# Patient Record
Sex: Female | Born: 1965 | Race: White | Hispanic: No | Marital: Married | Smoking: Former smoker
Health system: Southern US, Community
[De-identification: ages and names within clinical notes are randomized; demographics above are authoritative.]

## PROBLEM LIST (undated history)

## (undated) DIAGNOSIS — F419 Anxiety disorder, unspecified: Secondary | ICD-10-CM

## (undated) DIAGNOSIS — K449 Diaphragmatic hernia without obstruction or gangrene: Secondary | ICD-10-CM

## (undated) DIAGNOSIS — F32A Depression, unspecified: Secondary | ICD-10-CM

## (undated) DIAGNOSIS — K219 Gastro-esophageal reflux disease without esophagitis: Secondary | ICD-10-CM

## (undated) DIAGNOSIS — T8859XA Other complications of anesthesia, initial encounter: Secondary | ICD-10-CM

## (undated) DIAGNOSIS — C801 Malignant (primary) neoplasm, unspecified: Secondary | ICD-10-CM

## (undated) DIAGNOSIS — R6884 Jaw pain: Secondary | ICD-10-CM

## (undated) DIAGNOSIS — E7800 Pure hypercholesterolemia, unspecified: Secondary | ICD-10-CM

## (undated) DIAGNOSIS — L409 Psoriasis, unspecified: Secondary | ICD-10-CM

## (undated) DIAGNOSIS — C8590 Non-Hodgkin lymphoma, unspecified, unspecified site: Secondary | ICD-10-CM

## (undated) DIAGNOSIS — R0600 Dyspnea, unspecified: Secondary | ICD-10-CM

## (undated) DIAGNOSIS — R079 Chest pain, unspecified: Secondary | ICD-10-CM

## (undated) DIAGNOSIS — D649 Anemia, unspecified: Secondary | ICD-10-CM

## (undated) HISTORY — DX: Psoriasis, unspecified: L40.9

## (undated) HISTORY — DX: Malignant (primary) neoplasm, unspecified: C80.1

## (undated) HISTORY — DX: Pure hypercholesterolemia, unspecified: E78.00

## (undated) HISTORY — DX: Diaphragmatic hernia without obstruction or gangrene: K44.9

## (undated) HISTORY — DX: Non-Hodgkin lymphoma, unspecified, unspecified site: C85.90

## (undated) HISTORY — DX: Chest pain, unspecified: R07.9

## (undated) HISTORY — DX: Depression, unspecified: F32.A

## (undated) HISTORY — DX: Jaw pain: R68.84

## (undated) HISTORY — DX: Anemia, unspecified: D64.9

## (undated) HISTORY — PX: TUBAL LIGATION: SHX77

---

## 2015-10-15 ENCOUNTER — Other Ambulatory Visit: Payer: Self-pay | Admitting: Obstetrics and Gynecology

## 2015-10-15 DIAGNOSIS — E041 Nontoxic single thyroid nodule: Secondary | ICD-10-CM

## 2015-11-15 ENCOUNTER — Other Ambulatory Visit: Payer: Self-pay

## 2015-11-19 HISTORY — PX: ESOPHAGOGASTRODUODENOSCOPY: SHX1529

## 2017-06-02 ENCOUNTER — Other Ambulatory Visit: Payer: Self-pay | Admitting: Obstetrics and Gynecology

## 2017-06-02 DIAGNOSIS — E041 Nontoxic single thyroid nodule: Secondary | ICD-10-CM

## 2018-04-07 HISTORY — PX: COLONOSCOPY: SHX174

## 2021-09-25 ENCOUNTER — Other Ambulatory Visit: Payer: Self-pay | Admitting: Obstetrics and Gynecology

## 2021-09-25 DIAGNOSIS — E041 Nontoxic single thyroid nodule: Secondary | ICD-10-CM

## 2021-09-27 ENCOUNTER — Other Ambulatory Visit: Payer: Self-pay | Admitting: Obstetrics and Gynecology

## 2021-09-27 DIAGNOSIS — R928 Other abnormal and inconclusive findings on diagnostic imaging of breast: Secondary | ICD-10-CM

## 2021-10-08 ENCOUNTER — Other Ambulatory Visit: Payer: Self-pay

## 2021-10-09 ENCOUNTER — Ambulatory Visit
Admission: RE | Admit: 2021-10-09 | Discharge: 2021-10-09 | Disposition: A | Discharge status: Home / Self Care | Payer: BC Managed Care – PPO | Source: Ambulatory Visit | Attending: Obstetrics and Gynecology | Admitting: Obstetrics and Gynecology

## 2021-10-09 DIAGNOSIS — E041 Nontoxic single thyroid nodule: Secondary | ICD-10-CM

## 2021-11-05 ENCOUNTER — Other Ambulatory Visit: Payer: Self-pay | Admitting: Obstetrics and Gynecology

## 2021-11-05 ENCOUNTER — Ambulatory Visit
Admission: RE | Admit: 2021-11-05 | Discharge: 2021-11-05 | Disposition: A | Discharge status: Home / Self Care | Payer: BC Managed Care – PPO | Source: Ambulatory Visit | Attending: Obstetrics and Gynecology | Admitting: Obstetrics and Gynecology

## 2021-11-05 ENCOUNTER — Ambulatory Visit
Admission: RE | Admit: 2021-11-05 | Discharge: 2021-11-05 | Disposition: A | Discharge status: Home / Self Care | Payer: Self-pay | Source: Ambulatory Visit | Attending: Obstetrics and Gynecology | Admitting: Obstetrics and Gynecology

## 2021-11-05 DIAGNOSIS — R928 Other abnormal and inconclusive findings on diagnostic imaging of breast: Secondary | ICD-10-CM

## 2021-11-20 ENCOUNTER — Ambulatory Visit
Admission: RE | Admit: 2021-11-20 | Discharge: 2021-11-20 | Disposition: A | Discharge status: Home / Self Care | Payer: BC Managed Care – PPO | Source: Ambulatory Visit | Attending: Obstetrics and Gynecology | Admitting: Obstetrics and Gynecology

## 2021-11-20 DIAGNOSIS — R928 Other abnormal and inconclusive findings on diagnostic imaging of breast: Secondary | ICD-10-CM

## 2021-11-20 HISTORY — PX: BREAST BIOPSY: SHX20

## 2021-11-22 ENCOUNTER — Telehealth: Payer: Self-pay | Admitting: Hematology

## 2021-11-22 ENCOUNTER — Encounter: Payer: Self-pay | Admitting: Radiation Oncology

## 2021-11-22 NOTE — Telephone Encounter (Signed)
Spoke to patient to confirm morning clinic appointment for 1/18, packet will be sent via mail

## 2021-11-25 ENCOUNTER — Encounter: Payer: Self-pay | Admitting: *Deleted

## 2021-11-26 ENCOUNTER — Other Ambulatory Visit: Payer: Self-pay

## 2021-11-26 ENCOUNTER — Other Ambulatory Visit: Payer: Self-pay | Admitting: *Deleted

## 2021-11-26 DIAGNOSIS — Z170 Estrogen receptor positive status [ER+]: Secondary | ICD-10-CM

## 2021-11-26 DIAGNOSIS — C50919 Malignant neoplasm of unspecified site of unspecified female breast: Secondary | ICD-10-CM

## 2021-11-26 DIAGNOSIS — C50212 Malignant neoplasm of upper-inner quadrant of left female breast: Secondary | ICD-10-CM

## 2021-11-26 HISTORY — DX: Estrogen receptor positive status (ER+): C50.212

## 2021-11-26 NOTE — Progress Notes (Signed)
Radiation Oncology         [phone] ________________________________  Multidisciplinary Breast Oncology Clinic [facility]) Initial Outpatient Consultation  Name: Erika Hodge MRN: [id number]  Date: 11/27/2021  DOB: 1966/03/16  GB:EEFEO, Erika Hodge  Erroll Luna, Hodge   REFERRING PHYSICIAN: Erroll Luna, Hodge  DIAGNOSIS: The encounter diagnosis was Malignant neoplasm of upper-inner quadrant of left breast in female, estrogen receptor positive ([place]).  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 Malignant neoplasm of upper-inner quadrant of left breast in female, estrogen receptor positive ([place])  C50.212    Z17.0       HISTORY OF PRESENT ILLNESS::Erika Hodge is a 56 y.o. female who is presenting to the office today for evaluation of her newly diagnosed breast cancer. She is accompanied by her husband. She is doing well overall.   She had routine screening mammography on an unknown showing a possible abnormality in the left breast. She underwent unilateral left diagnostic mammography with tomography and left breast ultrasonography at [facility] on 11/05/21 showing: an irregular 1.2 cm mass in the left breast, 11 o'clock position, 2 cm the nipple, as suspicious for malignancy.  Left breast 11 o'clock biopsy on 11/20/21 showed: grade 2 invasive ductal carcinoma measuring 0.6 cm in the greatest linear extent, and ductal carcinoma in-situ. Prognostic indicators significant for: estrogen receptor, 10% positive with weak staining intensity, and progesterone receptor, 0% negative, both with. Proliferation marker Ki67 at 20%. HER2 negative.  Menarche: 56 years old Age at first live birth: 56 years old GP: 3 LMP: no longer having periods; s/p tubal ligation, did not indicate date of last menses on the provided form Contraceptive: used for "maybe 3 years"  HRT: yes, she  stopped this last year    The patient was referred today for presentation in the multidisciplinary conference.  Radiology studies and pathology slides were presented there for review and discussion of treatment options.  A consensus was discussed regarding potential next steps.  PREVIOUS RADIATION THERAPY: Yes, for Non Hodgkin's Lymphoma, details are pending at this time, but she reports that she had treatment in the lower neck and upper chest region, for about 4 weeks (in the mid 80's).  Reports receiving her radiation therapy at [facility].  She reports her medical oncologist was Hansel Feinstein.  PAST MEDICAL HISTORY:  Past Medical History:  Diagnosis Date   Non Hodgkin's lymphoma ([place])    Psoriasis     PAST SURGICAL HISTORY: Past Surgical History:  Procedure Laterality Date   BREAST BIOPSY Left 11/20/2021   TUBAL LIGATION      FAMILY HISTORY:  Family History  Problem Relation Age of Onset   Breast cancer Mother 62   Throat cancer Father     SOCIAL HISTORY:  Social History   Socioeconomic History   Marital status: Married    Spouse name: Not on file   Number of children: 3   Years of education: Not on file   Highest education level: Not on file  Occupational History   Not on file  Tobacco Use   Smoking status: Former    Packs/day: 0.50    Years: 15.00    Pack years: 7.50    Types: Cigarettes    Quit date: 2016    Years since quitting: 7.0   Smokeless tobacco: Not on file  Substance and Sexual Activity  Alcohol use: Yes    Comment: occas   Drug use: Not on file   Sexual activity: Not on file  Other Topics Concern   Not on file  Social History Narrative   Not on file   Social Determinants of Health   Financial Resource Strain: Not on file  Food Insecurity: Not on file  Transportation Needs: Not on file  Physical Activity: Not on file  Stress: Not on file  Social Connections: Not on file    ALLERGIES:  Allergies  Allergen Reactions   Amoxicillin  Rash and Other (See Comments)    Throat closing   Codeine Nausea Only    MEDICATIONS:  Current Outpatient Medications  Medication Sig Dispense Refill   Apple Cider Vinegar 300 MG TABS Take 1 tablet by mouth daily.     ascorbic acid (VITAMIN C) 250 MG CHEW Chew 250 mg by mouth daily.     ASHWAGANDHA PO Take 1 tablet by mouth daily.     Calcipotriene-Betameth Diprop (ENSTILAR) 0.005-0.064 % FOAM Apply 1 application topically as needed (apply to psoriasis area as needed).     FLUoxetine (PROZAC) 20 MG capsule Take 20 mg by mouth as needed.     lisdexamfetamine (VYVANSE) 50 MG capsule Take 50 mg by mouth as needed.     loratadine (CLARITIN) 10 MG tablet Take 10 mg by mouth daily.     Multiple Vitamins-Minerals (EMERGEN-C SUPER FRUIT PO) Take 1 tablet by mouth daily.     omeprazole (PRILOSEC) 40 MG capsule Take 40 mg by mouth daily.     Probiotic Product (DIGESTIVE ADV MULTI-STRAIN PO) Take 1 tablet by mouth daily.     zolpidem (AMBIEN) 5 MG tablet Take 5 mg by mouth at bedtime as needed for sleep.     No current facility-administered medications for this encounter.    REVIEW OF SYSTEMS: A 10+ POINT REVIEW OF SYSTEMS WAS OBTAINED including neurology, dermatology, psychiatry, cardiac, respiratory, lymph, extremities, GI, GU, musculoskeletal, constitutional, reproductive, HEENT. On the provided form, she reports loss of sleep, sinus problems, heartburn, psoriasis, forgetfulness, and anxiety . She denies any other symptoms.    PHYSICAL EXAM:   Vitals with BMI 11/27/2021  Height 5' 4"   Weight 174 lbs 3 oz  BMI 16.10  Systolic 960  Diastolic 85  Pulse 86    Lungs are clear to auscultation bilaterally. Heart has regular rate and rhythm. No palpable cervical, supraclavicular, or axillary adenopathy. Abdomen soft, non-tender, normal bowel sounds. Breast: Right breast with no palpable mass, nipple discharge, or bleeding. Left breast with bruising in the UOQ, and palpable induration in the 11  o'clock position, estimated to be about 1.5 cm in size. 3 prominent radiation tattoos were also appreciated in the upper chest region.   KPS = 90  100 - Normal; no complaints; no evidence of disease. 90   - Able to carry on normal activity; minor signs or symptoms of disease. 80   - Normal activity with effort; some signs or symptoms of disease. 77   - Cares for self; unable to carry on normal activity or to do active work. 60   - Requires occasional assistance, but is able to care for most of his personal needs. 50   - Requires considerable assistance and frequent medical care. 27   - Disabled; requires special care and assistance. 82   - Severely disabled; hospital admission is indicated although death not imminent. 11   - Very sick; hospital admission necessary; active supportive treatment  necessary. 10   - Moribund; fatal processes progressing rapidly. 0     - Dead  Karnofsky DA, Abelmann [place], Craver LS and Burchenal [facility] [phone]) The use of the nitrogen mustards in the palliative treatment of carcinoma: with particular reference to bronchogenic carcinoma Cancer 1 634-56  LABORATORY DATA:  Lab Results  Component Value Date   WBC 7.1 11/27/2021   HGB 13.4 11/27/2021   HCT 42.0 11/27/2021   MCV 96.3 11/27/2021   PLT 360 11/27/2021   Lab Results  Component Value Date   NA 137 11/27/2021   K 4.2 11/27/2021   CL 102 11/27/2021   CO2 28 11/27/2021   Lab Results  Component Value Date   ALT 15 11/27/2021   AST 17 11/27/2021   ALKPHOS 62 11/27/2021   BILITOT 0.3 11/27/2021    PULMONARY FUNCTION TEST:   Recent Review Flowsheet Data   There is no flowsheet data to display.     RADIOGRAPHY: US BREAST LTD UNI LEFT INC AXILLA  Result Date: 11/05/2021 CLINICAL DATA:  Screening recall for a possible left breast asymmetry. EXAM: DIGITAL DIAGNOSTIC UNILATERAL LEFT MAMMOGRAM WITH TOMOSYNTHESIS AND CAD; ULTRASOUND LEFT BREAST LIMITED TECHNIQUE: Left digital diagnostic mammography and  breast tomosynthesis was performed. The images were evaluated with computer-aided detection.; Targeted ultrasound examination of the left breast was performed. COMPARISON:  Previous exam(s). ACR Breast Density Category c: The breast tissue is heterogeneously dense, which may obscure small masses. FINDINGS: On the diagnostic images, the possible asymmetry noted in the posterior, slightly medial aspect of the left breast on the current screening cc view, persists as a possible irregular mass. An irregular mass is also suggested on the diagnostic mL images in the upper slightly medial left breast near 11 o'clock, around 1 cm in greatest dimension. There is subtle associated architectural distortion suggested on the mL images. On physical exam, no discrete mass is palpated in the upper inner left breast. Targeted ultrasound is performed, showing an irregular hypoechoic mass with associated architectural distortion in the left breast at 11 o'clock, 2 cm the nipple, posterior depth, measuring 1.2 x 0.9 x 1.1 cm, consistent in size, shape and location to the mammographic abnormality. Sonographic imaging of the left axilla demonstrates normal lymph nodes. There are no enlarged or abnormal appearing lymph nodes. IMPRESSION: 1. Irregular 1.2 cm mass in the left breast at 11 o'clock, 2 cm the nipple, suspicious for malignancy. RECOMMENDATION: 1. Ultrasound-guided core needle biopsy of the 11 o'clock position, 1.2 cm left breast mass. This procedure was scheduled prior to the patient being discharged from the [facility]. I have discussed the findings and recommendations with the patient. If applicable, a reminder letter will be sent to the patient regarding the next appointment. BI-RADS CATEGORY  4: Suspicious. Electronically Signed   By: Lajean Manes M.D.   On: 11/05/2021 11:51  MM DIAG BREAST TOMO UNI LEFT  Result Date: 11/05/2021 CLINICAL DATA:  Screening recall for a possible left breast asymmetry. EXAM: DIGITAL  DIAGNOSTIC UNILATERAL LEFT MAMMOGRAM WITH TOMOSYNTHESIS AND CAD; ULTRASOUND LEFT BREAST LIMITED TECHNIQUE: Left digital diagnostic mammography and breast tomosynthesis was performed. The images were evaluated with computer-aided detection.; Targeted ultrasound examination of the left breast was performed. COMPARISON:  Previous exam(s). ACR Breast Density Category c: The breast tissue is heterogeneously dense, which may obscure small masses. FINDINGS: On the diagnostic images, the possible asymmetry noted in the posterior, slightly medial aspect of the left breast on the current screening cc view, persists as a possible  irregular mass. An irregular mass is also suggested on the diagnostic mL images in the upper slightly medial left breast near 11 o'clock, around 1 cm in greatest dimension. There is subtle associated architectural distortion suggested on the mL images. On physical exam, no discrete mass is palpated in the upper inner left breast. Targeted ultrasound is performed, showing an irregular hypoechoic mass with associated architectural distortion in the left breast at 11 o'clock, 2 cm the nipple, posterior depth, measuring 1.2 x 0.9 x 1.1 cm, consistent in size, shape and location to the mammographic abnormality. Sonographic imaging of the left axilla demonstrates normal lymph nodes. There are no enlarged or abnormal appearing lymph nodes. IMPRESSION: 1. Irregular 1.2 cm mass in the left breast at 11 o'clock, 2 cm the nipple, suspicious for malignancy. RECOMMENDATION: 1. Ultrasound-guided core needle biopsy of the 11 o'clock position, 1.2 cm left breast mass. This procedure was scheduled prior to the patient being discharged from the [facility]. I have discussed the findings and recommendations with the patient. If applicable, a reminder letter will be sent to the patient regarding the next appointment. BI-RADS CATEGORY  4: Suspicious. Electronically Signed   By: Lajean Manes M.D.   On: 11/05/2021  11:51  MM CLIP PLACEMENT LEFT  Result Date: 11/20/2021 CLINICAL DATA:  Post procedure mammogram for clip placement. EXAM: 3D DIAGNOSTIC LEFT MAMMOGRAM POST ULTRASOUND BIOPSY COMPARISON:  Previous exam(s). FINDINGS: 3D Mammographic images were obtained following ultrasound guided biopsy of a mass in the left breast at 11 o'clock. The biopsy marking clip is in expected position at the site of biopsy. IMPRESSION: Appropriate positioning of the ribbon shaped biopsy marking clip at the site of biopsy in the left breast at 11 o'clock. Final Assessment: Post Procedure Mammograms for Marker Placement Electronically Signed   By: Audie Pinto M.D.   On: 11/20/2021 08:32  [place] LT BREAST BX W LOC DEV 1ST LESION IMG BX SPEC US GUIDE  Addendum Date: 11/22/2021   ADDENDUM REPORT: 11/22/2021 08:21 ADDENDUM: Pathology revealed GRADE II INVASIVE DUCTAL CARCINOMA, DUCTAL CARCINOMA IN SITU of the LEFT breast, 11 o'clock, (ribbon clip). This was found to be concordant by Dr. Audie Pinto. Pathology results were discussed with the patient by telephone. The patient reported doing well after the biopsy with tenderness and bruising at the site. Post biopsy instructions and care were reviewed and questions were answered. The patient was encouraged to call [facility] for any additional concerns. My direct phone number was provided. The patient was referred to [facility] on November 27, 2021. Pathology results reported by Terie Purser, RN on 11/22/2021. Electronically Signed   By: Audie Pinto M.D.   On: 11/22/2021 08:21   Result Date: 11/22/2021 CLINICAL DATA:  56 year old female presenting for biopsy of a left breast mass. EXAM: ULTRASOUND GUIDED LEFT BREAST CORE NEEDLE BIOPSY COMPARISON:  Previous exam(s). PROCEDURE: I met with the patient and we discussed the procedure of ultrasound-guided biopsy, including benefits  and alternatives. We discussed the high likelihood of a successful procedure. We discussed the risks of the procedure, including infection, bleeding, tissue injury, clip migration, and inadequate sampling. Informed written consent was given. The usual time-out protocol was performed immediately prior to the procedure. Lesion quadrant: Upper inner quadrant Using sterile technique and 1% Lidocaine as local anesthetic, under direct ultrasound visualization, a 14 gauge spring-loaded device was used to perform biopsy of a mass in the left breast  at 11 o'clock using a lateral approach. At the conclusion of the procedure a ribbon tissue marker clip was deployed into the biopsy cavity. Follow up 2 view mammogram was performed and dictated separately. IMPRESSION: Ultrasound guided biopsy of a mass in the left breast at 11 o'clock. No apparent complications. Electronically Signed: By: Audie Pinto M.D. 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 Per discussion with the patient, this did cover a major portion of the breast region bilaterally. Therefore, I do not recommend adjuvant radiation treatment for this patient.   We also discussed that her breast cancer may possibly be radiation/chemotherapy induced from her previous non-Hodgkin's lymphoma treatment.  Location of her radiation fields and time interval would be consistent with this issue.  We discussed that her surgical approach will likely consist of mastectomy and SNL procedure.    PLAN:  Genetics  Consider plastic surgery consult Mastectomy and SNL port placement  Adjuvant chemotherapy  Aromatase Inhibitor    ------------------------------------------------  Blair Promise, PhD, Hodge  This document serves as a  record of services personally performed by Gery Pray, Hodge. It was created on his behalf by Roney Mans, a trained medical scribe. The creation of this record is based on the scribe's personal observations and the provider's statements to them. This document has been checked and approved by the attending provider.

## 2021-11-27 ENCOUNTER — Ambulatory Visit: Payer: BC Managed Care – PPO | Attending: Surgery | Admitting: Physical Therapy

## 2021-11-27 ENCOUNTER — Encounter: Payer: Self-pay | Admitting: Hematology

## 2021-11-27 ENCOUNTER — Encounter: Payer: Self-pay | Admitting: Physical Therapy

## 2021-11-27 ENCOUNTER — Other Ambulatory Visit: Payer: Self-pay

## 2021-11-27 ENCOUNTER — Ambulatory Visit: Payer: Self-pay | Admitting: Surgery

## 2021-11-27 ENCOUNTER — Inpatient Hospital Stay: Payer: BC Managed Care – PPO | Admitting: Licensed Clinical Social Worker

## 2021-11-27 ENCOUNTER — Inpatient Hospital Stay: Payer: BC Managed Care – PPO | Attending: Hematology

## 2021-11-27 ENCOUNTER — Encounter: Payer: Self-pay | Admitting: *Deleted

## 2021-11-27 ENCOUNTER — Inpatient Hospital Stay (HOSPITAL_BASED_OUTPATIENT_CLINIC_OR_DEPARTMENT_OTHER): Payer: BC Managed Care – PPO | Admitting: Hematology

## 2021-11-27 ENCOUNTER — Ambulatory Visit (HOSPITAL_BASED_OUTPATIENT_CLINIC_OR_DEPARTMENT_OTHER): Payer: BC Managed Care – PPO | Admitting: Genetic Counselor

## 2021-11-27 ENCOUNTER — Ambulatory Visit
Admission: RE | Admit: 2021-11-27 | Discharge: 2021-11-27 | Disposition: A | Discharge status: Home / Self Care | Payer: BC Managed Care – PPO | Source: Ambulatory Visit | Attending: Radiation Oncology | Admitting: Radiation Oncology

## 2021-11-27 VITALS — BP 157/85 | HR 86 | Temp 97.6°F | Resp 18 | Ht 64.0 in | Wt 174.2 lb

## 2021-11-27 DIAGNOSIS — Z170 Estrogen receptor positive status [ER+]: Secondary | ICD-10-CM

## 2021-11-27 DIAGNOSIS — C50212 Malignant neoplasm of upper-inner quadrant of left female breast: Secondary | ICD-10-CM | POA: Diagnosis present

## 2021-11-27 DIAGNOSIS — Z923 Personal history of irradiation: Secondary | ICD-10-CM | POA: Insufficient documentation

## 2021-11-27 DIAGNOSIS — L409 Psoriasis, unspecified: Secondary | ICD-10-CM | POA: Insufficient documentation

## 2021-11-27 DIAGNOSIS — Z803 Family history of malignant neoplasm of breast: Secondary | ICD-10-CM

## 2021-11-27 DIAGNOSIS — Z171 Estrogen receptor negative status [ER-]: Secondary | ICD-10-CM | POA: Diagnosis not present

## 2021-11-27 DIAGNOSIS — Z8572 Personal history of non-Hodgkin lymphomas: Secondary | ICD-10-CM | POA: Insufficient documentation

## 2021-11-27 DIAGNOSIS — R293 Abnormal posture: Secondary | ICD-10-CM | POA: Diagnosis present

## 2021-11-27 LAB — CMP (CANCER CENTER ONLY)
ALT: 15 U/L (ref 0–44)
AST: 17 U/L (ref 15–41)
Albumin: 4.4 g/dL (ref 3.5–5.0)
Alkaline Phosphatase: 62 U/L (ref 38–126)
Anion gap: 7 (ref 5–15)
BUN: 17 mg/dL (ref 6–20)
CO2: 28 mmol/L (ref 22–32)
Calcium: 9.5 mg/dL (ref 8.9–10.3)
Chloride: 102 mmol/L (ref 98–111)
Creatinine: 0.87 mg/dL (ref 0.44–1.00)
GFR, Estimated: >60 mL/min (ref >60)
Glucose, Bld: 90 mg/dL (ref 70–99)
Potassium: 4.2 mmol/L (ref 3.5–5.1)
Sodium: 137 mmol/L (ref 135–145)
Total Bilirubin: 0.3 mg/dL (ref 0.3–1.2)
Total Protein: 7.5 g/dL (ref 6.5–8.1)

## 2021-11-27 LAB — CBC WITH DIFFERENTIAL (CANCER CENTER ONLY)
Abs Immature Granulocytes: 0.02 10*3/uL (ref 0.00–0.07)
Basophils Absolute: 0.1 10*3/uL (ref 0.0–0.1)
Basophils Relative: 1 %
Eosinophils Absolute: 0.4 10*3/uL (ref 0.0–0.5)
Eosinophils Relative: 5 %
HCT: 42 % (ref 36.0–46.0)
Hemoglobin: 13.4 g/dL (ref 12.0–15.0)
Immature Granulocytes: 0 %
Lymphocytes Relative: 23 %
Lymphs Abs: 1.6 10*3/uL (ref 0.7–4.0)
MCH: 30.7 pg (ref 26.0–34.0)
MCHC: 31.9 g/dL (ref 30.0–36.0)
MCV: 96.3 fL (ref 80.0–100.0)
Monocytes Absolute: 0.9 10*3/uL (ref 0.1–1.0)
Monocytes Relative: 13 %
Neutro Abs: 4.1 10*3/uL (ref 1.7–7.7)
Neutrophils Relative %: 58 %
Platelet Count: 360 10*3/uL (ref 150–400)
RBC: 4.36 MIL/uL (ref 3.87–5.11)
RDW: 12.6 % (ref 11.5–15.5)
WBC Count: 7.1 10*3/uL (ref 4.0–10.5)
nRBC: 0 % (ref 0.0–0.2)

## 2021-11-27 LAB — GENETIC SCREENING ORDER

## 2021-11-27 NOTE — Progress Notes (Signed)
[place]   [phone] Fax:([phone] [place] Note   Patient Care Team: Bonnita Nasuti, MD as PCP - General (Internal Medicine) Erroll Luna, MD as Consulting Physician (General Surgery) Truitt Merle, MD as Consulting Physician (Hematology) Gery Pray, MD as Consulting Physician (Radiation Oncology) Mauro Kaufmann, RN as Oncology Nurse Navigator Rockwell Germany, RN as Oncology Nurse Navigator  Date of Service:  11/27/2021   CHIEF COMPLAINTS/PURPOSE OF CONSULTATION:  Left Breast Cancer  REFERRING PHYSICIAN:  [facility]   ASSESSMENT & PLAN:  Erika Hodge is a 56 y.o. postmenopausal female with a history of psoriasis, lymphoma  1. 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 Left diagnostic MM and [place] on 11/05/21 showed 1.2 cm mass at 11 o'clock. 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 --We discussed her imaging findings and the biopsy results in great details. -Although she is a candidate for lumpectomy, Given her prior lymphoma and radiation, she was recommended to undergo left mastectomy. She met Dr. Brantley Stage today for full discussion. They are considering her options. -Given her weak ER positivity, this is functionally triple negative disease, the recommendation is for adjuvant chemotherapy with docetaxel and Cytoxan every 3 weeks for 4 cycles. If her surgical sentinel lymph node is positive or her tumor is larger than 2 cm, I recommend more aggressive treatment with CarboTaxol for 12 weeks followed by Adriamycin and Cytoxan for 4 cycles.  She is not a candidate for immunotherapy Keytruda due to her severe psoriasis.  Patient would like to hold on port placement during breast surgery, she is okay with port placement later on if she needs Adriamycin. -She was also seen by radiation oncologist Dr. Sondra Come today. He does not  recommend additional radiation therapy, regardless of her surgical decision. -We also discussed the breast cancer surveillance after her surgery. She will continue annual screening mammogram, self exam, and a routine office visit with lab and exam with [place]. -I encouraged her to have healthy diet and exercise regularly.   2. 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 Psoriasis  -previously on Tremfya injections, interested in restarting -I do not recommend immunotherapy because of this. I reviewed with them today. -followed by Dr. Abner Greenspan with [facility], per pt   PLAN:  -she will proceed with surgery per Dr. Brantley Stage -I will see her 2-3 weeks after surgery to finalize her adjuvant chemo -will repeat ER/PR/HER2 on her surgical sample    Oncology History Overview Note   Cancer Staging  Malignant neoplasm of upper-inner quadrant of left breast in female, estrogen receptor positive ([place]) Staging form: Breast, AJCC 8th Edition - Clinical stage from 11/20/2021: Stage IA (cT1c, cN0, cM0, G2, ER+, PR-, HER2-) - Signed by Truitt Merle, MD on 11/26/2021    Malignant neoplasm of upper-inner quadrant of left breast in female, estrogen receptor positive ([place])  11/05/2021 Mammogram   CLINICAL DATA:  Screening recall for a possible left breast asymmetry.   EXAM: DIGITAL DIAGNOSTIC UNILATERAL LEFT MAMMOGRAM WITH TOMOSYNTHESIS AND CAD; ULTRASOUND LEFT BREAST LIMITED  IMPRESSION: 1. Irregular 1.2 cm mass in the left breast at 11 o'clock, 2 cm the nipple, suspicious for malignancy.   11/20/2021 Cancer Staging   Staging form: Breast, AJCC 8th Edition - Clinical stage from 11/20/2021: Stage IA (cT1c, cN0, cM0, G2, ER+, PR-, HER2-) - Signed by Burr Medico,  Krista Blue, MD on 11/26/2021 Stage prefix: Initial diagnosis Histologic grading system: 3 grade system    11/20/2021 Initial Biopsy   Diagnosis Breast, left, needle core biopsy, 11 o'clock, ribbon clip - INVASIVE DUCTAL  CARCINOMA - DUCTAL CARCINOMA IN SITU - SEE COMMENT Microscopic Comment based on the biopsy, the carcinoma appears Nottingham grade 2 of 3 and measures 0.6 cm in greatest linear extent.  PROGNOSTIC INDICATORS Results: The tumor cells are NEGATIVE for Her2 (1+). Estrogen Receptor: 10%, POSITIVE, WEAK STAINING INTENSITY Progesterone Receptor: 0%, NEGATIVE Proliferation Marker Ki67: 20%   11/26/2021 Initial Diagnosis   Malignant neoplasm of upper-inner quadrant of left breast in female, estrogen receptor positive ([place])      HISTORY OF PRESENTING ILLNESS:  Erika Hodge 56 y.o. female is a here because of breast cancer. The patient was [facility]. The patient presents to the clinic today accompanied by her husband.   She reports she initially felt the lump herself, prompting her to schedule screening mammogram. She had routine screening mammography showing a possible abnormality in the left breast. She underwent left diagnostic mammography and left breast ultrasonography on 11/05/21 showing: 1.2 cm irregular mass at 11 o'clock; no enlarged or abnormal-appearing lymph nodes.  Biopsy on 11/20/21 showed: invasive ductal carcinoma, grade 2, and DCIS. Prognostic indicators significant for: estrogen receptor, 10% weakly positive and progesterone receptor, 0% negative. Proliferation marker Ki67 at 20%. HER2 negative.   Today the patient notes they felt/feeling prior/after...   She has a PMHx of.... -h/o non-Hodgkin's lymphoma to the chest, s/p radiation, in 1980's. -psoriasis, previously on Tremfya injections -s/p endometrial ablation  Socially... -she is self-employed -she is married with 3 children. -she reports breast cancer in her mother in her 58's and throat cancer in her father. -previous smoker, current vape use.  GYN HISTORY  Menarchal: xx LMP: s/p ablation Contraceptive: s/p tubal ligation HRT: used for last 2 years GP: 3, first at age 21 She reports  having hot flashes ~7 years ago.   REVIEW OF SYSTEMS:    Constitutional: Denies fevers, chills or abnormal night sweats Eyes: Denies blurriness of vision, double vision or watery eyes Ears, nose, mouth, throat, and face: Denies mucositis or sore throat Respiratory: Denies cough, dyspnea or wheezes Cardiovascular: Denies palpitation, chest discomfort or lower extremity swelling Gastrointestinal:  Denies nausea, heartburn or change in bowel habits Skin: Denies abnormal skin rashes Lymphatics: Denies new lymphadenopathy or easy bruising Neurological:Denies numbness, tingling or new weaknesses Behavioral/Psych: Mood is stable, no new changes  All other systems were reviewed with the patient and are negative.   MEDICAL HISTORY:  Past Medical History:  Diagnosis Date   Non Hodgkin's lymphoma ([place])    Psoriasis     SURGICAL HISTORY: Past Surgical History:  Procedure Laterality Date   BREAST BIOPSY Left 11/20/2021   TUBAL LIGATION      SOCIAL HISTORY: Social History   Socioeconomic History   Marital status: Married    Spouse name: Not on file   Number of children: 3   Years of education: Not on file   Highest education level: Not on file  Occupational History   Not on file  Tobacco Use   Smoking status: Former    Packs/day: 0.50    Years: 15.00    Pack years: 7.50    Types: Cigarettes    Quit date: 2016    Years since quitting: 7.0   Smokeless tobacco: Not on file  Substance and Sexual Activity   Alcohol use:  Yes    Comment: occas   Drug use: Not on file   Sexual activity: Not on file  Other Topics Concern   Not on file  Social History Narrative   Not on file   Social Determinants of Health   Financial Resource Strain: Low Risk    Difficulty of Paying Living Expenses: Not hard at all  Food Insecurity: No Food Insecurity   Worried About Running Out of Food in the Last Year: Never true   [place] in the Last Year: Never true  Transportation Needs: No  Transportation Needs   Lack of Transportation (Medical): No   Lack of Transportation (Non-Medical): No  Physical Activity: Not on file  Stress: Not on file  Social Connections: Not on file  Intimate Partner Violence: Not on file    FAMILY HISTORY: Family History  Problem Relation Age of Onset   Breast cancer Mother 66   Throat cancer Father     ALLERGIES:  is allergic to amoxicillin and codeine.  MEDICATIONS:  Current Outpatient Medications  Medication Sig Dispense Refill   Apple Cider Vinegar 300 MG TABS Take 1 tablet by mouth daily.     ascorbic acid (VITAMIN C) 250 MG CHEW Chew 250 mg by mouth daily.     ASHWAGANDHA PO Take 1 tablet by mouth daily.     Calcipotriene-Betameth Diprop (ENSTILAR) 0.005-0.064 % FOAM Apply 1 application topically as needed (apply to psoriasis area as needed).     FLUoxetine (PROZAC) 20 MG capsule Take 20 mg by mouth as needed.     lisdexamfetamine (VYVANSE) 50 MG capsule Take 50 mg by mouth as needed.     loratadine (CLARITIN) 10 MG tablet Take 10 mg by mouth daily.     Multiple Vitamins-Minerals (EMERGEN-C SUPER FRUIT PO) Take 1 tablet by mouth daily.     omeprazole (PRILOSEC) 40 MG capsule Take 40 mg by mouth daily.     Probiotic Product (DIGESTIVE ADV MULTI-STRAIN PO) Take 1 tablet by mouth daily.     zolpidem (AMBIEN) 5 MG tablet Take 5 mg by mouth at bedtime as needed for sleep.     No current facility-administered medications for this visit.    PHYSICAL EXAMINATION: ECOG PERFORMANCE STATUS: 0 - Asymptomatic  Vitals:   11/27/21 0828  BP: (!) 157/85  Pulse: 86  Resp: 18  Temp: 97.6 F (36.4 C)  SpO2: 98%   Filed Weights   11/27/21 0828  Weight: 174 lb 3.2 oz (79 kg)    GENERAL:alert, no distress and comfortable SKIN: skin color, texture, turgor are normal, no rashes or significant lesions EYES: normal, Conjunctiva are pink and non-injected, sclera clear  NECK: supple, thyroid normal size, non-tender, without nodularity LYMPH:   no palpable lymphadenopathy in the cervical, axillary  LUNGS: clear to auscultation and percussion with normal breathing effort HEART: regular rate & rhythm and no murmurs and no lower extremity edema ABDOMEN:abdomen soft, non-tender and normal bowel sounds Musculoskeletal:no cyanosis of digits and no clubbing  NEURO: alert & oriented x 3 with fluent speech, no focal motor/sensory deficits BREAST: 1 cm mass in left breast at 11-12 o'clock. No palpable lymph nodes bilaterally.  LABORATORY DATA:  I have reviewed the data as listed CBC Latest Ref Rng & Units 11/27/2021  WBC 4.0 - 10.5 K/uL 7.1  Hemoglobin 12.0 - 15.0 g/dL 13.4  Hematocrit 36.0 - 46.0 % 42.0  Platelets 150 - 400 K/uL 360    CMP Latest Ref Rng & Units 11/27/2021  Glucose 70 - 99 mg/dL 90  BUN 6 - 20 mg/dL 17  Creatinine 0.44 - 1.00 mg/dL 0.87  Sodium 135 - 145 mmol/L 137  Potassium 3.5 - 5.1 mmol/L 4.2  Chloride 98 - 111 mmol/L 102  CO2 22 - 32 mmol/L 28  Calcium 8.9 - 10.3 mg/dL 9.5  Total Protein 6.5 - 8.1 g/dL 7.5  Total Bilirubin 0.3 - 1.2 mg/dL 0.3  Alkaline Phos 38 - 126 U/L 62  AST 15 - 41 U/L 17  ALT 0 - 44 U/L 15     RADIOGRAPHIC STUDIES: I have personally reviewed the radiological images as listed and agreed with the findings in the report. US BREAST LTD UNI LEFT INC AXILLA  Result Date: 11/05/2021 CLINICAL DATA:  Screening recall for a possible left breast asymmetry. EXAM: DIGITAL DIAGNOSTIC UNILATERAL LEFT MAMMOGRAM WITH TOMOSYNTHESIS AND CAD; ULTRASOUND LEFT BREAST LIMITED TECHNIQUE: Left digital diagnostic mammography and breast tomosynthesis was performed. The images were evaluated with computer-aided detection.; Targeted ultrasound examination of the left breast was performed. COMPARISON:  Previous exam(s). ACR Breast Density Category c: The breast tissue is heterogeneously dense, which may obscure small masses. FINDINGS: On the diagnostic images, the possible asymmetry noted in the posterior,  slightly medial aspect of the left breast on the current screening cc view, persists as a possible irregular mass. An irregular mass is also suggested on the diagnostic mL images in the upper slightly medial left breast near 11 o'clock, around 1 cm in greatest dimension. There is subtle associated architectural distortion suggested on the mL images. On physical exam, no discrete mass is palpated in the upper inner left breast. Targeted ultrasound is performed, showing an irregular hypoechoic mass with associated architectural distortion in the left breast at 11 o'clock, 2 cm the nipple, posterior depth, measuring 1.2 x 0.9 x 1.1 cm, consistent in size, shape and location to the mammographic abnormality. Sonographic imaging of the left axilla demonstrates normal lymph nodes. There are no enlarged or abnormal appearing lymph nodes. IMPRESSION: 1. Irregular 1.2 cm mass in the left breast at 11 o'clock, 2 cm the nipple, suspicious for malignancy. RECOMMENDATION: 1. Ultrasound-guided core needle biopsy of the 11 o'clock position, 1.2 cm left breast mass. This procedure was scheduled prior to the patient being discharged from the [facility]. I have discussed the findings and recommendations with the patient. If applicable, a reminder letter will be sent to the patient regarding the next appointment. BI-RADS CATEGORY  4: Suspicious. Electronically Signed   By: Lajean Manes M.D.   On: 11/05/2021 11:51  MM DIAG BREAST TOMO UNI LEFT  Result Date: 11/05/2021 CLINICAL DATA:  Screening recall for a possible left breast asymmetry. EXAM: DIGITAL DIAGNOSTIC UNILATERAL LEFT MAMMOGRAM WITH TOMOSYNTHESIS AND CAD; ULTRASOUND LEFT BREAST LIMITED TECHNIQUE: Left digital diagnostic mammography and breast tomosynthesis was performed. The images were evaluated with computer-aided detection.; Targeted ultrasound examination of the left breast was performed. COMPARISON:  Previous exam(s). ACR Breast Density Category c: The breast  tissue is heterogeneously dense, which may obscure small masses. FINDINGS: On the diagnostic images, the possible asymmetry noted in the posterior, slightly medial aspect of the left breast on the current screening cc view, persists as a possible irregular mass. An irregular mass is also suggested on the diagnostic mL images in the upper slightly medial left breast near 11 o'clock, around 1 cm in greatest dimension. There is subtle associated architectural distortion suggested on the mL images. On physical exam, no discrete mass is palpated in the  upper inner left breast. Targeted ultrasound is performed, showing an irregular hypoechoic mass with associated architectural distortion in the left breast at 11 o'clock, 2 cm the nipple, posterior depth, measuring 1.2 x 0.9 x 1.1 cm, consistent in size, shape and location to the mammographic abnormality. Sonographic imaging of the left axilla demonstrates normal lymph nodes. There are no enlarged or abnormal appearing lymph nodes. IMPRESSION: 1. Irregular 1.2 cm mass in the left breast at 11 o'clock, 2 cm the nipple, suspicious for malignancy. RECOMMENDATION: 1. Ultrasound-guided core needle biopsy of the 11 o'clock position, 1.2 cm left breast mass. This procedure was scheduled prior to the patient being discharged from the [facility]. I have discussed the findings and recommendations with the patient. If applicable, a reminder letter will be sent to the patient regarding the next appointment. BI-RADS CATEGORY  4: Suspicious. Electronically Signed   By: Lajean Manes M.D.   On: 11/05/2021 11:51  MM CLIP PLACEMENT LEFT  Result Date: 11/20/2021 CLINICAL DATA:  Post procedure mammogram for clip placement. EXAM: 3D DIAGNOSTIC LEFT MAMMOGRAM POST ULTRASOUND BIOPSY COMPARISON:  Previous exam(s). FINDINGS: 3D Mammographic images were obtained following ultrasound guided biopsy of a mass in the left breast at 11 o'clock. The biopsy marking clip is in expected position at  the site of biopsy. IMPRESSION: Appropriate positioning of the ribbon shaped biopsy marking clip at the site of biopsy in the left breast at 11 o'clock. Final Assessment: Post Procedure Mammograms for Marker Placement Electronically Signed   By: Audie Pinto M.D.   On: 11/20/2021 08:32  [place] LT BREAST BX W LOC DEV 1ST LESION IMG BX SPEC US GUIDE  Addendum Date: 11/22/2021   ADDENDUM REPORT: 11/22/2021 08:21 ADDENDUM: Pathology revealed GRADE II INVASIVE DUCTAL CARCINOMA, DUCTAL CARCINOMA IN SITU of the LEFT breast, 11 o'clock, (ribbon clip). This was found to be concordant by Dr. Audie Pinto. Pathology results were discussed with the patient by telephone. The patient reported doing well after the biopsy with tenderness and bruising at the site. Post biopsy instructions and care were reviewed and questions were answered. The patient was encouraged to call [facility] for any additional concerns. My direct phone number was provided. The patient was referred to [facility] on November 27, 2021. Pathology results reported by Terie Purser, RN on 11/22/2021. Electronically Signed   By: Audie Pinto M.D.   On: 11/22/2021 08:21   Result Date: 11/22/2021 CLINICAL DATA:  56 year old female presenting for biopsy of a left breast mass. EXAM: ULTRASOUND GUIDED LEFT BREAST CORE NEEDLE BIOPSY COMPARISON:  Previous exam(s). PROCEDURE: I met with the patient and we discussed the procedure of ultrasound-guided biopsy, including benefits and alternatives. We discussed the high likelihood of a successful procedure. We discussed the risks of the procedure, including infection, bleeding, tissue injury, clip migration, and inadequate sampling. Informed written consent was given. The usual time-out protocol was performed immediately prior to the procedure. Lesion quadrant: Upper inner quadrant Using sterile technique and  1% Lidocaine as local anesthetic, under direct ultrasound visualization, a 14 gauge spring-loaded device was used to perform biopsy of a mass in the left breast at 11 o'clock using a lateral approach. At the conclusion of the procedure a ribbon tissue marker clip was deployed into the biopsy cavity. Follow up 2 view mammogram was performed and dictated separately. IMPRESSION: Ultrasound guided biopsy of a mass in the left breast at 11 o'clock. No apparent  complications. Electronically Signed: By: Audie Pinto M.D. On: 11/20/2021 08:33    No orders of the defined types were placed in this encounter.   All questions were answered. The patient knows to call the clinic with any problems, questions or concerns. The total time spent in the appointment was 60 minutes.     Truitt Merle, MD 11/27/2021 10:18 PM  I, Wilburn Mylar, am acting as scribe for Truitt Merle, MD.   I have reviewed the above documentation for accuracy and completeness, and I agree with the above.

## 2021-11-27 NOTE — Progress Notes (Signed)
[place] Work  Initial Assessment   Erika Hodge is a 56 y.o. year old female accompanied by husband, Barbaraann Rondo. Clinical Social Work was referred by [facility] for assessment of psychosocial needs.   SDOH (Social Determinants of Health) assessments performed: Yes SDOH Interventions    Flowsheet Row Most Recent Value  SDOH Interventions   Food Insecurity Interventions Intervention Not Indicated  Financial Strain Interventions Intervention Not Indicated  Housing Interventions Intervention Not Indicated  Transportation Interventions Intervention Not Indicated       Distress Screen completed: Yes ONCBCN DISTRESS SCREENING 11/27/2021  Screening Type Initial Screening  Distress experienced in past week (1-10) 7  Emotional problem type Depression;Nervousness/Anxiety  Spiritual/Religous concerns type Relating to God  Physical Problem type Sleep/insomnia      Family/Social Information:  Housing Arrangement: patient lives with husband . 3 children, ages 55, 76, 70 Family members/support persons in your life? 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 Income source: Employment Financial concerns: No Type of concern: None Food access concerns: no Services Currently in place:  n/a  Coping/ Adjustment to diagnosis: Patient understands treatment plan and what happens next? Pt appeared very overwhelmed by information but generally understands next steps Concerns about diagnosis and/or treatment: Overwhelmed by information Patient reported stressors: Depression and Anxiety Patient enjoys  travel, time with family Current coping skills/ strengths: Capable of independent living  and Supportive family/friends     SUMMARY: Current SDOH Barriers:  No SDOH concerns at this time  Clinical Social Work Clinical Goal(s):  No clinical SW goals at this time  Interventions: Discussed common feeling  and emotions when being diagnosed with cancer, and the importance of support during treatment Informed patient of the support team roles and support services at [facility] Provided CSW contact information and encouraged patient to call with any questions or concerns   Follow Up Plan: Patient will contact CSW with any support or resource needs Patient verbalizes understanding of plan: Yes    Christeen Douglas LCSW

## 2021-11-27 NOTE — Therapy (Signed)
OUTPATIENT PHYSICAL THERAPY BREAST CANCER BASELINE EVALUATION   Patient Name: Erika Hodge MRN: [id number] DOB:02/03/1966, 56 y.o., female Today's Date: 11/27/2021   PT End of Session - 11/27/21 1401     Visit Number 1    Number of Visits 2    Date for PT Re-Evaluation 01/22/22    PT Start Time 0954    PT Stop Time 1007   Also saw pt from 1031-1050 for a total of 32 minutes   PT Time Calculation (min) 13 min    Activity Tolerance Treatment limited secondary to agitation    Behavior During Therapy [facility] for tasks assessed/performed             Past Medical History:  Diagnosis Date   Non Hodgkin's lymphoma ([place])    Psoriasis    Past Surgical History:  Procedure Laterality Date   BREAST BIOPSY Left 11/20/2021   TUBAL LIGATION     Patient Active Problem List   Diagnosis Date Noted   Malignant neoplasm of upper-inner quadrant of left breast in female, estrogen receptor positive ([place]) 11/26/2021    PCP: Bonnita Nasuti, MD  REFERRING PROVIDER: Erroll Luna, MD  REFERRING DIAG: Left breast cancer  THERAPY DIAG:  Malignant neoplasm of upper-inner quadrant of left breast in female, estrogen receptor positive ([place])  Abnormal posture  ONSET DATE: 09/10/2021  SUBJECTIVE                                                                                                                                                                                           SUBJECTIVE STATEMENT: Patient reports she is here today to be seen by her medical team for her newly diagnosed left breast cancer.   PERTINENT HISTORY:  Patient was diagnosed on 09/10/2021 with 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 It measures 1.2 cm and is located in the upper inner quadrant. It is weakly ER positive, PR negative and HER2 negative with a Ki67 of 20%. She has a left wrist plate from a previous surgery in 2006 and a history of non-Hodgkins Lymphoma in 1985.  PATIENT GOALS   reduce  lymphedema risk and learn post op HEP.   PAIN:  Are you having pain? No  PRECAUTIONS: Active CA  WEIGHT BEARING RESTRICTIONS No  FALLS:  Has patient fallen in last 6 months? No, Number of falls: 0  LIVING ENVIRONMENT: Patient lives with: her husband Lives in: House/apartment Has following equipment at home: None  OCCUPATION: Self-employed; does mostly paperwork  LEISURE: She does not exercise  PRIOR LEVEL OF FUNCTION: Independent   OBJECTIVE  COGNITION:  Overall cognitive status: Within functional  limits for tasks assessed    POSTURE:  Forward head and rounded shoulders posture  UPPER EXTREMITY AROM/PROM:  A/PROM Right 11/27/2021 Left 11/27/2021  Shoulder extension 38 48  Shoulder flexion 157 152  Shoulder abduction 165 163  Shoulder internal rotation 67 62  Shoulder external rotation 77 77    (Blank rows = not tested)    CERVICAL AROM: All within normal limits  UPPER EXTREMITY STRENGTH: WNL   LYMPHEDEMA ASSESSMENTS:   LANDMARK RIGHT 11/27/2021 LEFT 11/27/2021  10 cm proximal to olecranon process 32 31.9  Olecranon process 27 27  10  cm proximal to ulnar styloid process 23.7 23  Just proximal to ulnar styloid process 16.4 16.8  Across hand at thumb web space 19 18.7  At base of 2nd digit 6.8 7.5  (Blank rows = not tested)   L-DEX LYMPHEDEMA SCREENING:  The patient was assessed using the L-Dex machine today to produce a lymphedema index baseline score. The patient will be reassessed on a regular basis (typically every 3 months) to obtain new L-Dex scores. If the score is > 6.5 points away from his/her baseline score indicating onset of subclinical lymphedema, it will be recommended to wear a compression garment for 4 weeks, 12 hours per day and then be reassessed. If the score continues to be > 6.5 points from baseline at reassessment, we will initiate lymphedema treatment. Assessing in this manner has a 95% rate of preventing clinically significant  lymphedema.  L-DEX LYMPHEDEMA SCREENING Measurement Type: Unilateral L-DEX MEASUREMENT EXTREMITY: Upper Extremity POSITION : Standing DOMINANT SIDE: Right At Risk Side: Left BASELINE SCORE (UNILATERAL): 0.7  QUICK DASH SURVEY:  Katina Dung - 11/27/21 0001     Open a tight or new jar Severe difficulty    Do heavy household chores (wash walls, wash floors) No difficulty    Carry a shopping bag or briefcase No difficulty    Wash your back No difficulty    Use a knife to cut food No difficulty    Recreational activities in which you take some force or impact through your arm, shoulder, or hand (golf, hammering, tennis) No difficulty    During the past week, to what extent has your arm, shoulder or hand problem interfered with your normal social activities with family, friends, neighbors, or groups? Not at all    During the past week, to what extent has your arm, shoulder or hand problem limited your work or other regular daily activities Not at all    Arm, shoulder, or hand pain. None    Tingling (pins and needles) in your arm, shoulder, or hand None    Difficulty Sleeping No difficulty    DASH Score 6.82 %              PATIENT EDUCATION:  Education details: Lymphedema risk reduction and post op shoulder/posture HEP Person educated: Patient Education method: Explanation, Demonstration, Handout Education comprehension: Patient verbalized understanding and returned demonstration   HOME EXERCISE PROGRAM: Patient was instructed today in a home exercise program today for post op shoulder range of motion. These included active assist shoulder flexion in sitting, scapular retraction, wall walking with shoulder abduction, and hands behind head external rotation.  She was encouraged to do these twice a day, holding 3 seconds and repeating 5 times when permitted by her physician.   ASSESSMENT:  CLINICAL IMPRESSION: Patient was diagnosed on 09/10/2021 with left grade II invasive ductal  carcinoma breast cancer. It measures 1.2 cm and is located in the upper  inner quadrant. It is weakly ER positive, PR negative and HER2 negative with a Ki67 of 20%. She has a left wrist plate from a previous surgery in 2006 and a history of non-Hodgkins Lymphoma in 1985. Her multidisciplinary medical team met prior to her assessments to determine a recommended treatment plan. She is planning to have either a left lumpectomy or left mastectomy and sentinel node biopsy followed by chemotherapy and radiation. She will benefit from a post op PT reassessment to determine needs and from L-Dex screens every 3 months for 2 years to detect subclinical lymphedema.  Pt will benefit from skilled therapeutic intervention to improve on the following deficits: Decreased knowledge of precautions, impaired UE functional use, pain, decreased ROM, postural dysfunction.   PT treatment/interventions: ADL/self-care home management, pt/family education, therapeutic exercise  REHAB POTENTIAL: Excellent  CLINICAL DECISION MAKING: Stable/uncomplicated  EVALUATION COMPLEXITY: Low   GOALS: Goals reviewed with patient? YES  LONG TERM GOALS: (STG=LTG)   Name Target Date Goal status  1 Pt will be able to verbalize understanding of pertinent lymphedema risk reduction practices relevant to her dx specifically related to skin care.  Baseline:  No knowledge 11/27/2021 Achieved at eval  2 Pt will be able to return demo and/or verbalize understanding of the post op HEP related to regaining shoulder ROM. Baseline:  No knowledge 11/27/2021 Achieved at eval  3 Pt will be able to verbalize understanding of the importance of attending the post op After Breast CA Class for further lymphedema risk reduction education and therapeutic exercise.  Baseline:  No knowledge 11/27/2021 Achieved at eval  4 Pt will demo she has regained full shoulder ROM and function post operatively compared to baselines.  Baseline: See objective measurements  taken today. 01/22/2022      PLAN: PT FREQUENCY/DURATION: EVAL and 1 follow up appointment.   PLAN FOR NEXT SESSION: will reassess 3-4 weeks post op to determine needs.   Patient will follow up at outpatient cancer rehab 3-4 weeks following surgery.  If the patient requires physical therapy at that time, a specific plan will be dictated and sent to the referring physician for approval. The patient was educated today on appropriate basic range of motion exercises to begin post operatively and the importance of attending the After Breast Cancer class following surgery.  Patient was educated today on lymphedema risk reduction practices as it pertains to recommendations that will benefit the patient immediately following surgery.  She verbalized good understanding.    Physical Therapy Information for After Breast Cancer Surgery/Treatment:  Lymphedema is a swelling condition that you may be at risk for in your arm if you have lymph nodes removed from the armpit area.  After a sentinel node biopsy, the risk is approximately 5-9% and is higher after an axillary node dissection.  There is treatment available for this condition and it is not life-threatening.  Contact your physician or physical therapist with concerns. You may begin the 4 shoulder/posture exercises (see additional sheet) when permitted by your physician (typically a week after surgery).  If you have drains, you may need to wait until those are removed before beginning range of motion exercises.  A general recommendation is to not lift your arms above shoulder height until drains are removed.  These exercises should be done to your tolerance and gently.  This is not a "no pain/no gain" type of recovery so listen to your body and stretch into the range of motion that you can tolerate, stopping if you have pain.  If you are having immediate reconstruction, ask your plastic surgeon about doing exercises as he or she may want you to wait. We  encourage you to attend the free one time ABC (After Breast Cancer) class offered by [facility].  You will learn information related to lymphedema risk, prevention and treatment and additional exercises to regain mobility following surgery.  You can call [phone] for more information.  This is offered the 1st and 3rd Monday of each month.  You only attend the class one time. While undergoing any medical procedure or treatment, try to avoid blood pressure being taken or needle sticks from occurring on the arm on the side of cancer.   This recommendation begins after surgery and continues for the rest of your life.  This may help reduce your risk of getting lymphedema (swelling in your arm). An excellent resource for those seeking information on lymphedema is the National Lymphedema Network's web site. It can be accessed at [place].org If you notice swelling in your hand, arm or breast at any time following surgery (even if it is many years from now), please contact your doctor or physical therapist to discuss this.  Lymphedema can be treated at any time but it is easier for you if it is treated early on.  If you feel like your shoulder motion is not returning to normal in a reasonable amount of time, please contact your surgeon or physical therapist.  [facility] [place], [place], [place] [phone]; [address] [place], [place] [address] ABC CLASS After Breast Cancer Class  After Breast Cancer Class is a specially designed exercise class to assist you in a safe recover after having breast cancer surgery.  In this class you will learn how to get back to full function whether your drains were just removed or if you had surgery a month ago.  This one-time class is held the 1st and 3rd Monday of every month from 11:00 a.m. until 12:00 noon at the [facility] located at [place],  [address]  This class is FREE and space is  limited. For more information or to register for the next available class, call [phone].  Class Goals  Understand specific stretches to improve the flexibility of you chest and shoulder. Learn ways to safely strengthen your upper body and improve your posture. Understand the warning signs of infection and why you may be at risk for an arm infection. Learn about Lymphedema and prevention.  ** You do not attend this class until after surgery.  Drains must be removed to participate  Patient was instructed today in a home exercise program today for post op shoulder range of motion. These included active assist shoulder flexion in sitting, scapular retraction, wall walking with shoulder abduction, and hands behind head external rotation.  She was encouraged to do these twice a day, holding 3 seconds and repeating 5 times when permitted by her physician.   Annia Friendly, [place] 11/27/21 2:08 PM

## 2021-11-28 ENCOUNTER — Telehealth: Payer: Self-pay | Admitting: *Deleted

## 2021-11-28 NOTE — Telephone Encounter (Signed)
Received message from patient that she has made a decision regarding surgery.  Called and spoke with patient and she informs me that she has decided to have bilateral mastectomies w/reconstruction.  Informed her I would refer her to plastic surgeon and I would let the team know.

## 2021-11-29 ENCOUNTER — Encounter: Payer: Self-pay | Admitting: *Deleted

## 2021-11-29 ENCOUNTER — Encounter: Payer: Self-pay | Admitting: Genetic Counselor

## 2021-11-29 ENCOUNTER — Telehealth: Payer: Self-pay | Admitting: *Deleted

## 2021-11-29 DIAGNOSIS — Z803 Family history of malignant neoplasm of breast: Secondary | ICD-10-CM | POA: Insufficient documentation

## 2021-11-29 HISTORY — DX: Family history of malignant neoplasm of breast: Z80.3

## 2021-11-29 NOTE — Progress Notes (Signed)
REFERRING PROVIDER: Truitt Merle, MD [place],  [id number]  PRIMARY PROVIDER:  Bonnita Nasuti, MD  PRIMARY REASON FOR VISIT:  1. Malignant neoplasm of upper-inner quadrant of left breast in female, estrogen receptor positive ([place])   2. Family history of breast cancer     HISTORY OF PRESENT ILLNESS:   Erika Hodge, a 56 y.o. female, was seen for a [place] cancer genetics consultation during the breast multidisciplinary clinic at the request of Dr. Burr Medico due to a personal and family history of cancer.  Ms. Flesch presents to clinic today to discuss the possibility of a hereditary predisposition to cancer, to discuss genetic testing, and to further clarify her future cancer risks, as well as potential cancer risks for family members.   In January 2023, at the age of 4, Erika Hodge was diagnosed with invasive ductal carcinoma of the left breast.   CANCER HISTORY:  Oncology History Overview Note   Cancer Staging  Malignant neoplasm of upper-inner quadrant of left breast in female, estrogen receptor positive ([place]) Staging form: Breast, AJCC 8th Edition - Clinical stage from 11/20/2021: Stage IA (cT1c, cN0, cM0, G2, ER+, PR-, HER2-) - Signed by Truitt Merle, MD on 11/26/2021    Malignant neoplasm of upper-inner quadrant of left breast in female, estrogen receptor positive ([place])  11/05/2021 Mammogram   CLINICAL DATA:  Screening recall for a possible left breast asymmetry.   EXAM: DIGITAL DIAGNOSTIC UNILATERAL LEFT MAMMOGRAM WITH TOMOSYNTHESIS AND CAD; ULTRASOUND LEFT BREAST LIMITED  IMPRESSION: 1. Irregular 1.2 cm mass in the left breast at 11 o'clock, 2 cm the nipple, suspicious for malignancy.   11/20/2021 Cancer Staging   Staging form: Breast, AJCC 8th Edition - Clinical stage from 11/20/2021: Stage IA (cT1c, cN0, cM0, G2, ER+, PR-, HER2-) - Signed by Truitt Merle, MD on 11/26/2021 Stage prefix: Initial diagnosis Histologic grading system: 3 grade system     11/20/2021 Initial Biopsy   Diagnosis Breast, left, needle core biopsy, 11 o'clock, ribbon clip - INVASIVE DUCTAL CARCINOMA - DUCTAL CARCINOMA IN SITU - SEE COMMENT Microscopic Comment based on the biopsy, the carcinoma appears Nottingham grade 2 of 3 and measures 0.6 cm in greatest linear extent.  PROGNOSTIC INDICATORS Results: The tumor cells are NEGATIVE for Her2 (1+). Estrogen Receptor: 10%, POSITIVE, WEAK STAINING INTENSITY Progesterone Receptor: 0%, NEGATIVE Proliferation Marker Ki67: 20%   11/26/2021 Initial Diagnosis   Malignant neoplasm of upper-inner quadrant of left breast in female, estrogen receptor positive ([place])     RISK FACTORS:  Menarche was at age 72.  First live birth at age 73.  OCP use for approximately 3 years.  Ovaries intact: yes.  Uterus intact: yes.  Colonoscopy: yes Mammogram within the last year: yes.  Past Medical History:  Diagnosis Date   Non Hodgkin's lymphoma ([place])    Psoriasis     Past Surgical History:  Procedure Laterality Date   BREAST BIOPSY Left 11/20/2021   TUBAL LIGATION      Social History   Socioeconomic History   Marital status: Married    Spouse name: Not on file   Number of children: 3   Years of education: Not on file   Highest education level: Not on file  Occupational History   Not on file  Tobacco Use   Smoking status: Former    Packs/day: 0.50    Years: 15.00    Pack years: 7.50    Types: Cigarettes    Quit date: 2016    Years  since quitting: 7.0   Smokeless tobacco: Not on file  Substance and Sexual Activity   Alcohol use: Yes    Comment: occas   Drug use: Not on file   Sexual activity: Not on file  Other Topics Concern   Not on file  Social History Narrative   Not on file   Social Determinants of Health   Financial Resource Strain: Low Risk    Difficulty of Paying Living Expenses: Not hard at all  Food Insecurity: No Food Insecurity   Worried About [place] in the Last Year:  Never true   [place] in the Last Year: Never true  Transportation Needs: No Transportation Needs   Lack of Transportation (Medical): No   Lack of Transportation (Non-Medical): No  Physical Activity: Not on file  Stress: Not on file  Social Connections: Not on file     FAMILY HISTORY:  We obtained a detailed, 4-generation family history.  Significant diagnoses are listed below: Family History  Problem Relation Age of Onset   Breast cancer Mother        dx. 11s, metastasized   Throat cancer Father        dx. 45s   Cancer Maternal Uncle        unknown type      Erika Hodge mother was diagnosed with breast cancer in her 27s, she died due to metastatic breast cancer at age 69. Her maternal uncle was diagnosed with an unknown type of cancer, he is deceased. Erika Hodge father was diagnosed with throat cancer in his 41s, he smoked, he died at age 68.  Erika Hodge is unaware of previous family history of genetic testing for hereditary cancer risks. There is no reported Ashkenazi Jewish ancestry.   GENETIC COUNSELING ASSESSMENT: Erika Hodge is a 56 y.o. female with a personal and family history of cancer which is somewhat suggestive of a hereditary cancer syndrome and predisposition to cancer. We, therefore, discussed and recommended the following at today's visit.   DISCUSSION: We discussed that 5 - 10% of cancer is hereditary, with most cases of hereditary breast cancer associated with mutations in BRCA1/2.  There are other genes that can be associated with hereditary breast cancer syndromes. Type of cancer risk and level of risk are gene-specific. We discussed that testing is beneficial for several reasons including knowing how to follow individuals after completing their treatment, identifying whether potential treatment options would be beneficial, and understanding if other family members could be at risk for cancer and allowing them to undergo genetic testing.   We  reviewed the characteristics, features and inheritance patterns of hereditary cancer syndromes. We also discussed genetic testing, including the appropriate family members to test, the process of testing, insurance coverage and turn-around-time for results. We discussed the implications of a negative, positive and/or variant of uncertain significant result. In order to get genetic test results in a timely manner so that Erika Hodge can use these genetic test results for surgical decisions, we recommended Erika Hodge pursue genetic testing for the BRCAplus. Once complete, we recommend Erika Hodge pursue reflex genetic testing to a more comprehensive gene panel.   Erika Hodge  was offered a common hereditary cancer panel (36 genes) and an expanded pan-cancer panel (77 genes). Erika Hodge was informed of the benefits and limitations of each panel, including that expanded pan-cancer panels contain genes that do not have clear management guidelines at this point in time.  We also discussed that  as the number of genes included on a panel increases, the chances of variants of uncertain significance increases. After considering the benefits and limitations of each gene panel, Erika Hodge elected to have [facility] CancerNext+RNA.  The CancerNext gene panel offered by [facility] includes sequencing, rearrangement analysis, and RNA analysis for the following 36 genes:   APC, ATM, AXIN2, BARD1, BMPR1A, BRCA1, BRCA2, BRIP1, CDH1, CDK4, CDKN2A, CHEK2, DICER1, HOXB13, EPCAM, GREM1, MLH1, MSH2, MSH3, MSH6, MUTYH, NBN, NF1, NTHL1, PALB2, PMS2, POLD1, POLE, PTEN, RAD51C, RAD51D, RECQL, SMAD4, SMARCA4, STK11, and TP53.    Based on Erika Hodge personal and family history of cancer, she meets medical criteria for genetic testing. Despite that she meets criteria, she may still have an out of pocket cost. We discussed that if her out of pocket cost for testing is over $100, the laboratory should contact them to discuss  self-pay prices, patient pay assistance programs, if applicable, and other billing options.   PLAN: After considering the risks, benefits, and limitations, Erika Hodge provided informed consent to pursue genetic testing and the blood sample was sent to [facility] for analysis of the CancerNext Panel. Results should be available within approximately 1-2 weeks' time, at which point they will be disclosed by telephone to Erika Hodge, as will any additional recommendations warranted by these results. Erika Hodge will receive a summary of her genetic counseling visit and a copy of her results once available. This information will also be available in Epic.   Erika Hodge questions were answered to her satisfaction today. Our contact information was provided should additional questions or concerns arise. Thank you for the referral and allowing [place] to share in the care of your patient.   [name], MS, [facility] Genetic Counselor [email or web address] (P) [phone]  The patient was seen for a total of 15 minutes in face-to-face genetic counseling.  The patient brought her husband. Drs. Lindi Adie and/or Burr Medico were available to discuss this case as needed.  _______________________________________________________________________ For Office Staff:  Number of people involved in session: 2 Was an Intern/ student involved with case: no

## 2021-11-29 NOTE — Telephone Encounter (Signed)
Called and spoke with patient to let her know I have her scheduled to see Dr. Iran Planas 1/26 at 9:30am. Gave address and phone number to Dr. Para Skeans office.  Informed her I have let Dr. Josetta Huddle office know. Encouraged her to call should she have any other questions or concerns.

## 2021-12-02 DIAGNOSIS — Z923 Personal history of irradiation: Secondary | ICD-10-CM

## 2021-12-02 DIAGNOSIS — J309 Allergic rhinitis, unspecified: Secondary | ICD-10-CM

## 2021-12-02 DIAGNOSIS — K219 Gastro-esophageal reflux disease without esophagitis: Secondary | ICD-10-CM

## 2021-12-02 HISTORY — DX: Gastro-esophageal reflux disease without esophagitis: K21.9

## 2021-12-02 HISTORY — DX: Allergic rhinitis, unspecified: J30.9

## 2021-12-02 HISTORY — DX: Personal history of irradiation: Z92.3

## 2021-12-03 ENCOUNTER — Ambulatory Visit: Payer: Self-pay | Admitting: Surgery

## 2021-12-04 ENCOUNTER — Ambulatory Visit: Payer: Self-pay | Admitting: Surgery

## 2021-12-05 ENCOUNTER — Encounter: Payer: Self-pay | Admitting: *Deleted

## 2021-12-06 ENCOUNTER — Telehealth: Payer: Self-pay | Admitting: Hematology

## 2021-12-06 NOTE — Telephone Encounter (Signed)
Sch per 1/27 inbasket, pt aware

## 2021-12-10 ENCOUNTER — Other Ambulatory Visit: Payer: Self-pay

## 2021-12-10 ENCOUNTER — Telehealth: Payer: Self-pay | Admitting: *Deleted

## 2021-12-10 NOTE — Telephone Encounter (Signed)
Received call from patient asking if she could take a Tremfya injection for her psoriasis because it has gotten worse.  Per Dr. Burr Medico this is ok to do. Informed patient.

## 2021-12-10 NOTE — Progress Notes (Signed)
Pt called wanting to know has Dr. Burr Medico consulted with Dr. Abner Greenspan with [facility] regarding her Tremfya injections for psoriasis.  Pt stated she's having a severe flare-up and would like to restart her medication if possible.  Informed pt that I would let Dr. Burr Medico know of the pt's concerns and complaint.

## 2021-12-11 ENCOUNTER — Telehealth: Payer: Self-pay | Admitting: Genetic Counselor

## 2021-12-11 ENCOUNTER — Encounter: Payer: Self-pay | Admitting: Genetic Counselor

## 2021-12-11 DIAGNOSIS — Z1379 Encounter for other screening for genetic and chromosomal anomalies: Secondary | ICD-10-CM

## 2021-12-11 HISTORY — DX: Encounter for other screening for genetic and chromosomal anomalies: Z13.79

## 2021-12-11 NOTE — Telephone Encounter (Signed)
I contacted Erika Hodge to discuss her genetic testing results. No pathogenic variants were identified in the 8 genes analyzed. Of note, we are waiting on additional genes to be analyzed and will contact her when they are available. Detailed clinic note to follow.  The test report has been scanned into EPIC and is located under the Molecular Pathology section of the Results Review tab.  A portion of the result report is included below for reference.   [name], MS, [facility] Genetic Counselor [email or web address] (P) [phone]

## 2021-12-13 ENCOUNTER — Telehealth: Payer: Self-pay | Admitting: Genetic Counselor

## 2021-12-13 ENCOUNTER — Telehealth: Payer: Self-pay

## 2021-12-13 NOTE — Telephone Encounter (Signed)
I contacted Erika Hodge to discuss her genetic testing results. No pathogenic variants were identified in the 36 genes analyzed. Detailed clinic note to follow.  The test report has been scanned into EPIC and is located under the Molecular Pathology section of the Results Review tab.  A portion of the result report is included below for reference.   [name], MS, [facility] Genetic Counselor [name] (P) [phone]

## 2021-12-13 NOTE — Telephone Encounter (Signed)
Spoke with Erika Hodge via telephone regarding psoriasis medication/injections.  Dr. Burr Medico sent in staff message that Erika Hodge is "OK" to receive the injections while receiving chemotherapy.  Erika Hodge wanted to speak with her Dermatologist Dr. Abner Greenspan as well regarding the injections who stated she does not recommend the Erika Hodge continue to receive the injections and will never be able to receive the injections again once the Erika Hodge starts/or have gotten chemotherapy.  Erika Hodge is upset but is scheduled to see Dr. Abner Greenspan in clinic next week which they will discuss other psoriasis tx options.  Erika Hodge stated she will update Dr. Burr Medico on the outcome of her meeting with Dr. Abner Greenspan.

## 2021-12-16 ENCOUNTER — Ambulatory Visit: Payer: Self-pay | Admitting: Genetic Counselor

## 2021-12-16 DIAGNOSIS — C50212 Malignant neoplasm of upper-inner quadrant of left female breast: Secondary | ICD-10-CM

## 2021-12-16 DIAGNOSIS — Z1379 Encounter for other screening for genetic and chromosomal anomalies: Secondary | ICD-10-CM

## 2021-12-16 NOTE — Progress Notes (Signed)
HPI:   Erika Hodge was previously seen in the [place] clinic due to a personal and family history of cancer and concerns regarding a hereditary predisposition to cancer. Please refer to our prior cancer genetics clinic note for more information regarding our discussion, assessment and recommendations, at the time. Erika Hodge recent genetic test results were disclosed to her, as were recommendations warranted by these results. These results and recommendations are discussed in more detail below.  CANCER HISTORY:  Oncology History Overview Note   Cancer Staging  Malignant neoplasm of upper-inner quadrant of left breast in female, estrogen receptor positive ([place]) Staging form: Breast, AJCC 8th Edition - Clinical stage from 11/20/2021: Stage IA (cT1c, cN0, cM0, G2, ER+, PR-, HER2-) - Signed by Truitt Merle, MD on 11/26/2021    Malignant neoplasm of upper-inner quadrant of left breast in female, estrogen receptor positive ([place])  11/05/2021 Mammogram   CLINICAL DATA:  Screening recall for a possible left breast asymmetry.   EXAM: DIGITAL DIAGNOSTIC UNILATERAL LEFT MAMMOGRAM WITH TOMOSYNTHESIS AND CAD; ULTRASOUND LEFT BREAST LIMITED  IMPRESSION: 1. Irregular 1.2 cm mass in the left breast at 11 o'clock, 2 cm the nipple, suspicious for malignancy.   11/20/2021 Cancer Staging   Staging form: Breast, AJCC 8th Edition - Clinical stage from 11/20/2021: Stage IA (cT1c, cN0, cM0, G2, ER+, PR-, HER2-) - Signed by Truitt Merle, MD on 11/26/2021 Stage prefix: Initial diagnosis Histologic grading system: 3 grade system    11/20/2021 Initial Biopsy   Diagnosis Breast, left, needle core biopsy, 11 o'clock, ribbon clip - INVASIVE DUCTAL CARCINOMA - DUCTAL CARCINOMA IN SITU - SEE COMMENT Microscopic Comment based on the biopsy, the carcinoma appears Nottingham grade 2 of 3 and measures 0.6 cm in greatest linear extent.  PROGNOSTIC INDICATORS Results: The tumor cells are NEGATIVE for  Her2 (1+). Estrogen Receptor: 10%, POSITIVE, WEAK STAINING INTENSITY Progesterone Receptor: 0%, NEGATIVE Proliferation Marker Ki67: 20%   11/26/2021 Initial Diagnosis   Malignant neoplasm of upper-inner quadrant of left breast in female, estrogen receptor positive ([place])    Genetic Testing   [facility] CancerNext was Negative. Report date is 12/11/2021.  The CancerNext gene panel offered by [employer] includes sequencing, rearrangement analysis, and RNA analysis for the following 36 genes:   APC, ATM, AXIN2, BARD1, BMPR1A, BRCA1, BRCA2, BRIP1, CDH1, CDK4, CDKN2A, CHEK2, DICER1, HOXB13, EPCAM, GREM1, MLH1, MSH2, MSH3, MSH6, MUTYH, NBN, NF1, NTHL1, PALB2, PMS2, POLD1, POLE, PTEN, RAD51C, RAD51D, RECQL, SMAD4, SMARCA4, STK11, and TP53.      FAMILY HISTORY:  We obtained a detailed, 4-generation family history.  Significant diagnoses are listed below:      Family History  Problem Relation Age of Onset   Breast cancer Mother          dx. 65s, metastasized   Throat cancer Father          dx. 96s   Cancer Maternal Uncle          unknown type        Erika Hodge mother was diagnosed with breast cancer in her 37s, she died due to metastatic breast cancer at age 26. Her maternal uncle was diagnosed with an unknown type of cancer, he is deceased. Erika Hodge father was diagnosed with throat cancer in his 29s, he smoked, he died at age 39.   Erika Hodge is unaware of previous family history of genetic testing for hereditary cancer risks. There is no reported Ashkenazi Jewish ancestry.   GENETIC TEST RESULTS:  The  [facility] CancerNext Panel found no pathogenic mutations.  The CancerNext gene panel offered by [employer] includes sequencing, rearrangement analysis, and RNA analysis for the following 36 genes:   APC, ATM, AXIN2, BARD1, BMPR1A, BRCA1, BRCA2, BRIP1, CDH1, CDK4, CDKN2A, CHEK2, DICER1, HOXB13, EPCAM, GREM1, MLH1, MSH2, MSH3, MSH6, MUTYH, NBN, NF1, NTHL1, PALB2, PMS2, POLD1, POLE,  PTEN, RAD51C, RAD51D, RECQL, SMAD4, SMARCA4, STK11, and TP53.    The test report has been scanned into EPIC and is located under the Molecular Pathology section of the Results Review tab.  A portion of the result report is included below for reference. Genetic testing reported out on 12/11/2021.        Even though a pathogenic variant was not identified, possible explanations for her personal history of cancer may include: There may be no hereditary risk for cancer in the family. The cancers in Erika Hodge and/or her family may be due to other genetic or environmental factors. There may be a gene mutation in one of these genes that current testing methods cannot detect, but that chance is small. There could be another gene that has not yet been discovered, or that we have not yet tested, that is responsible for the cancer diagnoses in the family.   Therefore, it is important to remain in touch with cancer genetics in the future so that we can continue to offer Erika Hodge the most up to date genetic testing.   ADDITIONAL GENETIC TESTING:  We discussed with Erika Hodge that her genetic testing was fairly extensive.  If there are genes identified to increase cancer risk that can be analyzed in the future, we would be happy to discuss and coordinate this testing at that time.    CANCER SCREENING RECOMMENDATIONS:  Erika Hodge test result is considered negative (normal).  This means that we have not identified a hereditary cause for her personal and family history of cancer at this time.   An individual's cancer risk and medical management are not determined by genetic test results alone. Overall cancer risk assessment incorporates additional factors, including personal medical history, family history, and any available genetic information that may result in a personalized plan for cancer prevention and surveillance. Therefore, it is recommended she continue to follow the cancer management and  screening guidelines provided by her oncology and primary healthcare provider.  RECOMMENDATIONS FOR FAMILY MEMBERS:   Since she did not inherit a mutation in a cancer predisposition gene included on this panel, her children could not have inherited a mutation from her in one of these genes. Individuals in this family might be at some increased risk of developing cancer, over the general population risk, due to the family history of cancer. We recommend women in this family have a yearly mammogram beginning at age 46, or 87 years younger than the earliest onset of cancer, an annual clinical breast exam, and perform monthly breast self-exams.   FOLLOW-UP:  Cancer genetics is a rapidly advancing field and it is possible that new genetic tests will be appropriate for her and/or her family members in the future. We encouraged her to remain in contact with cancer genetics on an annual basis so we can update her personal and family histories and let her know of advances in cancer genetics that may benefit this family.   Our contact number was provided. Erika Hodge questions were answered to her satisfaction, and she knows she is welcome to call us at anytime with additional questions or concerns.   Erika Almond Daemon Dowty,  MS, [facility] Genetic Counselor [email or web address] (P) [phone]

## 2021-12-23 NOTE — H&P (Signed)
Subjective:     Patient ID: Erika Hodge is a 56 y.o. female.   HPI   Returns for follow up discussion breast reconstruction. Presented following screening MMG with left breast abnormality. Diagnostic MMG/US showed mass in the left breast at 11 o'clock, 2 cmfn, 1.2 x 0.9 x 1.1 cm. Axilla normal. Biopsy showed IDC with DCIS, ER+ weak, PR-, Her2-.   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 PMH also significant for psoriasis previously on Tremfaya. Not candidate for immunotherapy for cancer breast if she resumes this.   Given above patient is not candidate for adjuvant RT, mastectomy recommended. Patient has elected for bilateral. Patient will receive adjuvant chemotherapy. She has declined port placement at time of mastectomy.   Mother with metastatic breast ca. Genetics negative.   Current 36 B happy with this. Wt up 25 lbs over last year   Stopped vaping with nicotine at time of diagnosis. Lives with spouse and youngest son age 61. 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    Review of Systems Remainder 12 point review negative    Objective:   Physical Exam Cardiovascular:     Rate and Rhythm: Normal rate and regular rhythm.     Heart sounds: Normal heart sounds.  Pulmonary:     Effort: Pulmonary effort is normal.     Breath sounds: Normal breath sounds.  Lymphadenopathy:     Upper Body:     Right upper body: No axillary adenopathy.     Left upper body: No axillary adenopathy.     Breasts: No palpable masses No ptosis bilateral SN to nipple R  21 L 21 cm BW R 18 L 18 cm CW 13.5 cm Nipple to IMF R 8 L 8 cm      Assessment:     Hx NHL s/p mantle RT Left breast ca UIQ ER+    Plan:     Needs to be off all nicotine products for 4-6 weeks prior to surgery.   Plan bilateral NSM with immediate tissue expander based reconstruction.   Reviewed drains, OR length, hospital stay and recovery, limitations. Discussed process of expansion and implant based risks  including rupture, imaging surveillance for silicone implants, infection requiring surgery or removal, contracture. Reviewed risks mastectomy flap necrosis requiring additional surgery. Reviewed implants are not permanent devices and will require additional surgery.  Counseled expander placement at time of mastectomy does not preclude her from autologous reconstruction in future.   Discussed use of acellular dermis in reconstruction, cadaveric source, incorporation over several weeks, risk that if has seroma or infection can act as additional nidus for infection if not incorporated.    Discussed prepectoral vs sub pectoral reconstruction. Discussed with patient and benefit of this is no animation deformity, may be less pain. Risk may be more visible rippling over upper poles, greater need of ADM. Reviewed pre pectoral would require larger amount acellular dermis, more drains. Discussed any type reconstruction also risks long term displacement implant and visible rippling. If prepectoral counseled I would recommend she be comfortable with silicone implants as more options that have less rippling. She agrees to prepectoral placement.   Reviewed baseline risk reconstruction. In setting prior likely mantle RT, the published papers are varied and counseled she should expect higher than baseline risk, quoted 12-75% change complications from reconstruction. This can include increased capsular contracture or wound healing problems, which in setting implant based reconstruction can mean exposure or extrusion device.    Reviewed reconstruction will be  asensate and not stimulate. Reviewed additional risks including but not limited to risks mastectomy flap necrosis requiring additional surgery, seroma, hematoma, asymmetry, need to additional procedures, fat necrosis, DVT/PE, damage to adjacent structures, cardiopulmonary complications.   Drain teaching completed. Rx for [place] given. Rx for oxycodone,  robaxin, and Bactrim given.   Since last visit has started [place] for psoriasis- asked her to speak with her Dermatologist to determine whether this needs to be held in perioperative period.

## 2021-12-25 ENCOUNTER — Encounter: Payer: Self-pay | Admitting: *Deleted

## 2021-12-31 ENCOUNTER — Other Ambulatory Visit: Payer: Self-pay

## 2021-12-31 ENCOUNTER — Encounter (HOSPITAL_BASED_OUTPATIENT_CLINIC_OR_DEPARTMENT_OTHER): Payer: Self-pay | Admitting: Surgery

## 2022-01-06 NOTE — Anesthesia Preprocedure Evaluation (Addendum)
Anesthesia Evaluation  Patient identified by MRN, date of birth, ID band Patient awake    Reviewed: Allergy & Precautions, NPO status , Patient's Chart, lab work & pertinent test results  History of Anesthesia Complications Negative for: history of anesthetic complications  Airway Mallampati: II  TM Distance: >3 FB Neck ROM: Full    Dental  (+) Dental Advisory Given   Pulmonary neg pulmonary ROS, former smoker,    Pulmonary exam normal        Cardiovascular negative cardio ROS Normal cardiovascular exam     Neuro/Psych negative neurological ROS     GI/Hepatic negative GI ROS, Neg liver ROS,   Endo/Other  negative endocrine ROS  Renal/GU negative Renal ROS  negative genitourinary   Musculoskeletal negative musculoskeletal ROS (+)   Abdominal   Peds  Hematology Non Hodgkin's lymphoma   Anesthesia Other Findings  Breast cancer  Reproductive/Obstetrics                            Anesthesia Physical Anesthesia Plan  ASA: 2  Anesthesia Plan: General   Post-op Pain Management: Toradol IV (intra-op)* and Ofirmev IV (intra-op)*   Induction: Intravenous  PONV Risk Score and Plan: 3 and Ondansetron, Dexamethasone, Treatment may vary due to age or medical condition and Midazolam  Airway Management Planned: Oral ETT  Additional Equipment: None  Intra-op Plan:   Post-operative Plan: Extubation in OR  Informed Consent: I have reviewed the patients History and Physical, chart, labs and discussed the procedure including the risks, benefits and alternatives for the proposed anesthesia with the patient or authorized representative who has indicated his/her understanding and acceptance.     Dental advisory given  Plan Discussed with:   Anesthesia Plan Comments:       Anesthesia Quick Evaluation

## 2022-01-06 NOTE — Progress Notes (Signed)

## 2022-01-07 ENCOUNTER — Encounter (HOSPITAL_BASED_OUTPATIENT_CLINIC_OR_DEPARTMENT_OTHER)
Admission: RE | Disposition: A | Discharge status: Home / Self Care | Payer: Self-pay | Source: Home / Self Care | Attending: Plastic Surgery

## 2022-01-07 ENCOUNTER — Ambulatory Visit (HOSPITAL_BASED_OUTPATIENT_CLINIC_OR_DEPARTMENT_OTHER)
Admission: RE | Admit: 2022-01-07 | Discharge: 2022-01-08 | Disposition: A | Discharge status: Home / Self Care | Payer: BC Managed Care – PPO | Attending: Plastic Surgery | Admitting: Plastic Surgery

## 2022-01-07 ENCOUNTER — Other Ambulatory Visit: Payer: Self-pay

## 2022-01-07 ENCOUNTER — Ambulatory Visit (HOSPITAL_BASED_OUTPATIENT_CLINIC_OR_DEPARTMENT_OTHER): Payer: BC Managed Care – PPO | Admitting: Anesthesiology

## 2022-01-07 ENCOUNTER — Encounter (HOSPITAL_BASED_OUTPATIENT_CLINIC_OR_DEPARTMENT_OTHER): Payer: Self-pay | Admitting: Surgery

## 2022-01-07 DIAGNOSIS — Z170 Estrogen receptor positive status [ER+]: Secondary | ICD-10-CM | POA: Diagnosis not present

## 2022-01-07 DIAGNOSIS — L409 Psoriasis, unspecified: Secondary | ICD-10-CM | POA: Insufficient documentation

## 2022-01-07 DIAGNOSIS — C50212 Malignant neoplasm of upper-inner quadrant of left female breast: Secondary | ICD-10-CM | POA: Insufficient documentation

## 2022-01-07 DIAGNOSIS — Z8572 Personal history of non-Hodgkin lymphomas: Secondary | ICD-10-CM | POA: Diagnosis not present

## 2022-01-07 DIAGNOSIS — Z87891 Personal history of nicotine dependence: Secondary | ICD-10-CM | POA: Insufficient documentation

## 2022-01-07 DIAGNOSIS — Z803 Family history of malignant neoplasm of breast: Secondary | ICD-10-CM | POA: Insufficient documentation

## 2022-01-07 DIAGNOSIS — Z923 Personal history of irradiation: Secondary | ICD-10-CM | POA: Diagnosis not present

## 2022-01-07 DIAGNOSIS — C50912 Malignant neoplasm of unspecified site of left female breast: Secondary | ICD-10-CM | POA: Diagnosis present

## 2022-01-07 HISTORY — DX: Anxiety disorder, unspecified: F41.9

## 2022-01-07 HISTORY — PX: NIPPLE SPARING MASTECTOMY: SHX6537

## 2022-01-07 HISTORY — PX: NIPPLE SPARING MASTECTOMY WITH SENTINEL LYMPH NODE BIOPSY: SHX6826

## 2022-01-07 HISTORY — DX: Other complications of anesthesia, initial encounter: T88.59XA

## 2022-01-07 HISTORY — PX: BREAST RECONSTRUCTION WITH PLACEMENT OF TISSUE EXPANDER AND ALLODERM: SHX6805

## 2022-01-07 SURGERY — NIPPLE SPARING MASTECTOMY WITH SENTINEL LYMPH NODE BIOPSY
Anesthesia: General | Site: Breast | Laterality: Right

## 2022-01-07 MED ORDER — OXYCODONE HCL 5 MG PO TABS
ORAL_TABLET | ORAL | Status: AC
  Filled 2022-01-07: qty 1

## 2022-01-07 MED ORDER — MIDAZOLAM HCL 2 MG/2ML IJ SOLN
2.0000 mg | Freq: Once | INTRAMUSCULAR | Status: AC
Start: 2022-01-07 — End: 2022-01-07
  Administered 2022-01-07: 2 mg via INTRAVENOUS

## 2022-01-07 MED ORDER — ACETAMINOPHEN 500 MG PO TABS
ORAL_TABLET | ORAL | Status: AC
  Filled 2022-01-07: qty 2

## 2022-01-07 MED ORDER — MIDAZOLAM HCL 2 MG/2ML IJ SOLN
INTRAMUSCULAR | Status: AC
  Filled 2022-01-07: qty 2

## 2022-01-07 MED ORDER — CHLORHEXIDINE GLUCONATE CLOTH 2 % EX PADS: 6.0000 | MEDICATED_PAD | Freq: Once | CUTANEOUS | Status: DC

## 2022-01-07 MED ORDER — ONDANSETRON 4 MG PO TBDP: 4.0000 mg | ORAL_TABLET | Freq: Four times a day (QID) | ORAL | Status: DC | PRN

## 2022-01-07 MED ORDER — LACTATED RINGERS IV SOLN: INTRAVENOUS | Status: DC

## 2022-01-07 MED ORDER — MAGTRACE LYMPHATIC TRACER
INTRAMUSCULAR | Status: DC | PRN
Start: 2022-01-07 — End: 2022-01-07
  Administered 2022-01-07: 2 mL via INTRAMUSCULAR

## 2022-01-07 MED ORDER — ENOXAPARIN SODIUM 40 MG/0.4ML IJ SOSY
40.0000 mg | PREFILLED_SYRINGE | INTRAMUSCULAR | Status: DC
  Administered 2022-01-08: 40 mg via SUBCUTANEOUS
  Filled 2022-01-07: qty 0.4

## 2022-01-07 MED ORDER — FENTANYL CITRATE (PF) 100 MCG/2ML IJ SOLN
25.0000 ug | INTRAMUSCULAR | Status: DC | PRN
  Administered 2022-01-07 (×3): 50 ug via INTRAVENOUS

## 2022-01-07 MED ORDER — ROCURONIUM BROMIDE 100 MG/10ML IV SOLN
INTRAVENOUS | Status: DC | PRN
Start: 2022-01-07 — End: 2022-01-07
  Administered 2022-01-07: 100 mg via INTRAVENOUS

## 2022-01-07 MED ORDER — OXYCODONE HCL 5 MG/5ML PO SOLN: 5.0000 mg | Freq: Once | ORAL | Status: AC | PRN

## 2022-01-07 MED ORDER — FENTANYL CITRATE (PF) 100 MCG/2ML IJ SOLN
100.0000 ug | Freq: Once | INTRAMUSCULAR | Status: AC
  Administered 2022-01-07: 100 ug via INTRAVENOUS

## 2022-01-07 MED ORDER — FENTANYL CITRATE (PF) 100 MCG/2ML IJ SOLN
INTRAMUSCULAR | Status: AC
  Filled 2022-01-07: qty 2

## 2022-01-07 MED ORDER — CLINDAMYCIN PHOSPHATE 900 MG/50ML IV SOLN
900.0000 mg | INTRAVENOUS | Status: AC
Start: 2022-01-07 — End: 2022-01-07
  Administered 2022-01-07: 900 mg via INTRAVENOUS

## 2022-01-07 MED ORDER — AMISULPRIDE (ANTIEMETIC) 5 MG/2ML IV SOLN: 10.0000 mg | Freq: Once | INTRAVENOUS | Status: DC | PRN

## 2022-01-07 MED ORDER — FLUOXETINE HCL 20 MG PO CAPS
20.0000 mg | ORAL_CAPSULE | Freq: Every day | ORAL | Status: DC
  Filled 2022-01-07: qty 1

## 2022-01-07 MED ORDER — LIDOCAINE HCL (CARDIAC) PF 100 MG/5ML IV SOSY
PREFILLED_SYRINGE | INTRAVENOUS | Status: DC | PRN
  Administered 2022-01-07: 40 mg via INTRAVENOUS

## 2022-01-07 MED ORDER — PHENYLEPHRINE 40 MCG/ML (10ML) SYRINGE FOR IV PUSH (FOR BLOOD PRESSURE SUPPORT)
PREFILLED_SYRINGE | INTRAVENOUS | Status: AC
  Filled 2022-01-07: qty 10

## 2022-01-07 MED ORDER — ONDANSETRON HCL 4 MG/2ML IJ SOLN: 4.0000 mg | Freq: Four times a day (QID) | INTRAMUSCULAR | Status: DC | PRN

## 2022-01-07 MED ORDER — 0.9 % SODIUM CHLORIDE (POUR BTL) OPTIME
TOPICAL | Status: DC | PRN
  Administered 2022-01-07: 1000 mL

## 2022-01-07 MED ORDER — SUGAMMADEX SODIUM 200 MG/2ML IV SOLN
INTRAVENOUS | Status: DC | PRN
  Administered 2022-01-07: 200 mg via INTRAVENOUS

## 2022-01-07 MED ORDER — BUPIVACAINE HCL (PF) 0.25 % IJ SOLN
INTRAMUSCULAR | Status: DC | PRN
  Administered 2022-01-07 (×2): 30 mL

## 2022-01-07 MED ORDER — CLINDAMYCIN PHOSPHATE 900 MG/50ML IV SOLN
INTRAVENOUS | Status: AC
  Filled 2022-01-07: qty 50

## 2022-01-07 MED ORDER — SODIUM CHLORIDE 0.9 % IV SOLN
INTRAVENOUS | Status: AC
  Filled 2022-01-07: qty 10

## 2022-01-07 MED ORDER — ACETAMINOPHEN 10 MG/ML IV SOLN
INTRAVENOUS | Status: AC
  Filled 2022-01-07: qty 100

## 2022-01-07 MED ORDER — ONDANSETRON HCL 4 MG/2ML IJ SOLN
INTRAMUSCULAR | Status: AC
  Filled 2022-01-07: qty 2

## 2022-01-07 MED ORDER — ONDANSETRON HCL 4 MG/2ML IJ SOLN: 4.0000 mg | Freq: Once | INTRAMUSCULAR | Status: DC | PRN

## 2022-01-07 MED ORDER — METHOCARBAMOL 500 MG PO TABS
500.0000 mg | ORAL_TABLET | Freq: Four times a day (QID) | ORAL | Status: DC | PRN
  Administered 2022-01-07 – 2022-01-08 (×3): 500 mg via ORAL
  Filled 2022-01-07 (×3): qty 1

## 2022-01-07 MED ORDER — POVIDONE-IODINE 10 % EX SOLN
CUTANEOUS | Status: DC | PRN
  Administered 2022-01-07: 1 via TOPICAL

## 2022-01-07 MED ORDER — KCL IN DEXTROSE-NACL 20-5-0.45 MEQ/L-%-% IV SOLN
INTRAVENOUS | Status: DC
  Filled 2022-01-07: qty 1000

## 2022-01-07 MED ORDER — ONDANSETRON HCL 4 MG/2ML IJ SOLN
INTRAMUSCULAR | Status: DC | PRN
  Administered 2022-01-07: 4 mg via INTRAVENOUS

## 2022-01-07 MED ORDER — PROPOFOL 10 MG/ML IV BOLUS
INTRAVENOUS | Status: DC | PRN
  Administered 2022-01-07: 30 mg via INTRAVENOUS
  Administered 2022-01-07: 200 mg via INTRAVENOUS

## 2022-01-07 MED ORDER — HYDROMORPHONE HCL 1 MG/ML IJ SOLN: 0.5000 mg | INTRAMUSCULAR | Status: DC | PRN

## 2022-01-07 MED ORDER — DEXAMETHASONE SODIUM PHOSPHATE 4 MG/ML IJ SOLN
INTRAMUSCULAR | Status: DC | PRN
  Administered 2022-01-07: 10 mg via INTRAVENOUS

## 2022-01-07 MED ORDER — PHENYLEPHRINE HCL (PRESSORS) 10 MG/ML IV SOLN
INTRAVENOUS | Status: DC | PRN
  Administered 2022-01-07 (×4): 80 ug via INTRAVENOUS

## 2022-01-07 MED ORDER — OXYCODONE HCL 5 MG PO TABS
5.0000 mg | ORAL_TABLET | ORAL | Status: DC | PRN
  Administered 2022-01-07: 10 mg via ORAL
  Administered 2022-01-07 – 2022-01-08 (×3): 5 mg via ORAL
  Filled 2022-01-07: qty 2
  Filled 2022-01-07 (×3): qty 1

## 2022-01-07 MED ORDER — DEXAMETHASONE SODIUM PHOSPHATE 10 MG/ML IJ SOLN
INTRAMUSCULAR | Status: AC
  Filled 2022-01-07: qty 1

## 2022-01-07 MED ORDER — MIDAZOLAM HCL 5 MG/5ML IJ SOLN
INTRAMUSCULAR | Status: DC | PRN
Start: 2022-01-07 — End: 2022-01-07
  Administered 2022-01-07: 2 mg via INTRAVENOUS

## 2022-01-07 MED ORDER — OXYCODONE HCL 5 MG PO TABS
5.0000 mg | ORAL_TABLET | Freq: Once | ORAL | Status: AC | PRN
  Administered 2022-01-07: 5 mg via ORAL

## 2022-01-07 MED ORDER — PROPOFOL 500 MG/50ML IV EMUL
INTRAVENOUS | Status: AC
  Filled 2022-01-07: qty 50

## 2022-01-07 MED ORDER — CIPROFLOXACIN IN D5W 400 MG/200ML IV SOLN
400.0000 mg | Freq: Two times a day (BID) | INTRAVENOUS | Status: AC
  Administered 2022-01-07 – 2022-01-08 (×2): 400 mg via INTRAVENOUS
  Filled 2022-01-07 (×2): qty 200

## 2022-01-07 MED ORDER — ACETAMINOPHEN 500 MG PO TABS
1000.0000 mg | ORAL_TABLET | ORAL | Status: AC
  Administered 2022-01-07: 1000 mg via ORAL

## 2022-01-07 MED ORDER — LIDOCAINE 2% (20 MG/ML) 5 ML SYRINGE
INTRAMUSCULAR | Status: AC
  Filled 2022-01-07: qty 5

## 2022-01-07 MED ORDER — KETOROLAC TROMETHAMINE 30 MG/ML IJ SOLN
30.0000 mg | Freq: Three times a day (TID) | INTRAMUSCULAR | Status: AC
  Administered 2022-01-07 – 2022-01-08 (×3): 30 mg via INTRAVENOUS
  Filled 2022-01-07 (×3): qty 1

## 2022-01-07 MED ORDER — FENTANYL CITRATE (PF) 100 MCG/2ML IJ SOLN
INTRAMUSCULAR | Status: DC | PRN
  Administered 2022-01-07 (×4): 50 ug via INTRAVENOUS

## 2022-01-07 MED ORDER — SODIUM CHLORIDE 0.9 % IV SOLN
INTRAVENOUS | Status: DC | PRN
  Administered 2022-01-07: 500 mL

## 2022-01-07 MED ORDER — CLINDAMYCIN PHOSPHATE 900 MG/50ML IV SOLN: 900.0000 mg | INTRAVENOUS | Status: DC

## 2022-01-07 MED ORDER — ACETAMINOPHEN 10 MG/ML IV SOLN
1000.0000 mg | Freq: Once | INTRAVENOUS | Status: AC
  Administered 2022-01-07: 1000 mg via INTRAVENOUS

## 2022-01-07 MED ORDER — PANTOPRAZOLE SODIUM 40 MG PO TBEC
40.0000 mg | DELAYED_RELEASE_TABLET | Freq: Every day | ORAL | Status: DC
  Administered 2022-01-07: 40 mg via ORAL
  Filled 2022-01-07: qty 1

## 2022-01-07 SURGICAL SUPPLY — 101 items
ALLOGRAFT PERF 16X20 1.6+/-0.4 (Tissue) ×2 IMPLANT
APPLIER CLIP 11 MED OPEN (CLIP)
APPLIER CLIP 9.375 MED OPEN (MISCELLANEOUS) ×4
BAG DECANTER FOR FLEXI CONT (MISCELLANEOUS) ×4 IMPLANT
BENZOIN TINCTURE PRP APPL 2/3 (GAUZE/BANDAGES/DRESSINGS) IMPLANT
BINDER BREAST LRG (GAUZE/BANDAGES/DRESSINGS) IMPLANT
BINDER BREAST MEDIUM (GAUZE/BANDAGES/DRESSINGS) IMPLANT
BINDER BREAST XLRG (GAUZE/BANDAGES/DRESSINGS) IMPLANT
BINDER BREAST XXLRG (GAUZE/BANDAGES/DRESSINGS) IMPLANT
BLADE HEX COATED 2.75 (ELECTRODE) ×3 IMPLANT
BLADE SURG 10 STRL SS (BLADE) ×4 IMPLANT
BLADE SURG 15 STRL LF DISP TIS (BLADE) ×3 IMPLANT
BLADE SURG 15 STRL SS (BLADE) ×2
BNDG GAUZE ELAST 4 BULKY (GAUZE/BANDAGES/DRESSINGS) ×2 IMPLANT
CANISTER SUCT 1200ML W/VALVE (MISCELLANEOUS) ×5 IMPLANT
CHLORAPREP W/TINT 26 (MISCELLANEOUS) ×5 IMPLANT
CLIP APPLIE 11 MED OPEN (CLIP) IMPLANT
CLIP APPLIE 9.375 MED OPEN (MISCELLANEOUS) ×3 IMPLANT
COVER BACK TABLE 60X90IN (DRAPES) ×4 IMPLANT
COVER MAYO STAND STRL (DRAPES) ×5 IMPLANT
COVER PROBE W GEL 5X96 (DRAPES) ×4 IMPLANT
DERMABOND ADVANCED (GAUZE/BANDAGES/DRESSINGS) ×2
DERMABOND ADVANCED .7 DNX12 (GAUZE/BANDAGES/DRESSINGS) ×6 IMPLANT
DRAIN CHANNEL 15F RND FF W/TCR (WOUND CARE) ×2 IMPLANT
DRAIN CHANNEL 19F RND (DRAIN) ×5 IMPLANT
DRAPE INCISE IOBAN 66X45 STRL (DRAPES) ×1 IMPLANT
DRAPE LAPAROSCOPIC ABDOMINAL (DRAPES) ×3 IMPLANT
DRAPE TOP ARMCOVERS (MISCELLANEOUS) ×4 IMPLANT
DRAPE U-SHAPE 76X120 STRL (DRAPES) ×5 IMPLANT
DRAPE UTILITY XL STRL (DRAPES) ×5 IMPLANT
DRSG PAD ABDOMINAL 8X10 ST (GAUZE/BANDAGES/DRESSINGS) ×10 IMPLANT
DRSG TEGADERM 2-3/8X2-3/4 SM (GAUZE/BANDAGES/DRESSINGS) IMPLANT
DRSG TEGADERM 4X10 (GAUZE/BANDAGES/DRESSINGS) ×6 IMPLANT
ELECT BLADE 4.0 EZ CLEAN MEGAD (MISCELLANEOUS) ×4
ELECT BLADE 6.5 EXT (BLADE) ×1 IMPLANT
ELECT COATED BLADE 2.86 ST (ELECTRODE) ×3 IMPLANT
ELECT REM PT RETURN 9FT ADLT (ELECTROSURGICAL) ×4
ELECTRODE BLDE 4.0 EZ CLN MEGD (MISCELLANEOUS) ×3 IMPLANT
ELECTRODE REM PT RTRN 9FT ADLT (ELECTROSURGICAL) ×3 IMPLANT
EVACUATOR SILICONE 100CC (DRAIN) ×7 IMPLANT
EXPANDER TISSUE FV FOURTE 400 (Prosthesis & Implant Plastic) IMPLANT
GAUZE SPONGE 4X4 12PLY STRL LF (GAUZE/BANDAGES/DRESSINGS) IMPLANT
GLOVE SRG 8 PF TXTR STRL LF DI (GLOVE) ×3 IMPLANT
GLOVE SURG HYDRASOFT LTX SZ5.5 (GLOVE) ×4 IMPLANT
GLOVE SURG LTX SZ8 (GLOVE) ×5 IMPLANT
GLOVE SURG POLYISO LF SZ6.5 (GLOVE) ×5 IMPLANT
GLOVE SURG POLYISO LF SZ7 (GLOVE) ×1 IMPLANT
GLOVE SURG UNDER POLY LF SZ7 (GLOVE) ×5 IMPLANT
GLOVE SURG UNDER POLY LF SZ8 (GLOVE) ×2
GOWN STRL REUS W/ TWL LRG LVL3 (GOWN DISPOSABLE) ×6 IMPLANT
GOWN STRL REUS W/ TWL XL LVL3 (GOWN DISPOSABLE) ×3 IMPLANT
GOWN STRL REUS W/TWL LRG LVL3 (GOWN DISPOSABLE) ×6
GOWN STRL REUS W/TWL XL LVL3 (GOWN DISPOSABLE)
HEMOSTAT ARISTA ABSORB 3G PWDR (HEMOSTASIS) ×2 IMPLANT
LIGHT WAVEGUIDE WIDE FLAT (MISCELLANEOUS) ×1 IMPLANT
MARKER SKIN DUAL TIP RULER LAB (MISCELLANEOUS) IMPLANT
NDL HYPO 25X1 1.5 SAFETY (NEEDLE) ×6 IMPLANT
NDL SAFETY ECLIPSE 18X1.5 (NEEDLE) ×3 IMPLANT
NEEDLE HYPO 18GX1.5 SHARP (NEEDLE)
NEEDLE HYPO 25X1 1.5 SAFETY (NEEDLE) IMPLANT
NS IRRIG 1000ML POUR BTL (IV SOLUTION) ×2 IMPLANT
PACK BASIN DAY SURGERY FS (CUSTOM PROCEDURE TRAY) ×4 IMPLANT
PENCIL SMOKE EVACUATOR (MISCELLANEOUS) ×4 IMPLANT
PIN SAFETY STERILE (MISCELLANEOUS) ×5 IMPLANT
PUNCH BIOPSY DERMAL 4MM (MISCELLANEOUS) IMPLANT
SHEET MEDIUM DRAPE 40X70 STRL (DRAPES) ×5 IMPLANT
SLEEVE SCD COMPRESS KNEE MED (STOCKING) ×4 IMPLANT
SPIKE FLUID TRANSFER (MISCELLANEOUS) IMPLANT
SPONGE T-LAP 18X18 ~~LOC~~+RFID (SPONGE) ×10 IMPLANT
SPONGE T-LAP 4X18 ~~LOC~~+RFID (SPONGE) IMPLANT
STAPLER VISISTAT 35W (STAPLE) ×4 IMPLANT
STRIP CLOSURE SKIN 1/2X4 (GAUZE/BANDAGES/DRESSINGS) IMPLANT
SUT CHROMIC 3 0 SH 27 (SUTURE) IMPLANT
SUT CHROMIC 4 0 PS 2 18 (SUTURE) ×5 IMPLANT
SUT ETHILON 2 0 FS 18 (SUTURE) ×5 IMPLANT
SUT MNCRL AB 3-0 PS2 18 (SUTURE) ×3 IMPLANT
SUT MNCRL AB 4-0 PS2 18 (SUTURE) ×5 IMPLANT
SUT MON AB 4-0 PC3 18 (SUTURE) IMPLANT
SUT PDS AB 2-0 CT2 27 (SUTURE) IMPLANT
SUT SILK 2 0 PERMA HAND 18 BK (SUTURE) ×3 IMPLANT
SUT SILK 2 0 SH (SUTURE) ×2 IMPLANT
SUT VIC AB 3-0 54X BRD REEL (SUTURE) ×3 IMPLANT
SUT VIC AB 3-0 BRD 54 (SUTURE)
SUT VIC AB 3-0 SH 27 (SUTURE)
SUT VIC AB 3-0 SH 27X BRD (SUTURE) IMPLANT
SUT VICRYL 0 CT-2 (SUTURE) ×4 IMPLANT
SUT VICRYL 3-0 CR8 SH (SUTURE) ×7 IMPLANT
SUT VICRYL 4-0 PS2 18IN ABS (SUTURE) ×2 IMPLANT
SUT VLOC 180 0 24IN GS25 (SUTURE) ×2 IMPLANT
SYR 10ML LL (SYRINGE) ×3 IMPLANT
SYR 50ML LL SCALE MARK (SYRINGE) ×4 IMPLANT
SYR BULB IRRIG 60ML STRL (SYRINGE) ×4 IMPLANT
SYR CONTROL 10ML LL (SYRINGE) ×6 IMPLANT
TAPE MEASURE VINYL STERILE (MISCELLANEOUS) IMPLANT
TISSUE EXPNDR FV FOURTE 400 (Prosthesis & Implant Plastic) ×8 IMPLANT
TOWEL GREEN STERILE FF (TOWEL DISPOSABLE) ×8 IMPLANT
TRACER MAGTRACE VIAL (MISCELLANEOUS) ×1 IMPLANT
TRAY DSU PREP LF (CUSTOM PROCEDURE TRAY) ×1 IMPLANT
TUBE CONNECTING 20X1/4 (TUBING) ×4 IMPLANT
UNDERPAD 30X36 HEAVY ABSORB (UNDERPADS AND DIAPERS) ×8 IMPLANT
YANKAUER SUCT BULB TIP NO VENT (SUCTIONS) ×5 IMPLANT

## 2022-01-07 NOTE — Anesthesia Postprocedure Evaluation (Signed)
Anesthesia Post Note  Patient: Erika Hodge  Procedure(s) Performed: LEFT NIPPLE SPARING MASTECTOMY WITH SENTINEL LYMPH NODE BIOPSY (Left: Breast) RIGHT NIPPLE SPARING MASTECTOMY (Right: Breast) BILATERAL BREAST RECONSTRUCTION WITH PLACEMENT OF TISSUE EXPANDERS AND ALLODERM (Bilateral: Breast)     Patient location during evaluation: PACU Anesthesia Type: General Level of consciousness: awake and alert Pain management: pain level controlled Vital Signs Assessment: post-procedure vital signs reviewed and stable Respiratory status: spontaneous breathing, nonlabored ventilation and respiratory function stable Cardiovascular status: blood pressure returned to baseline and stable Postop Assessment: no apparent nausea or vomiting Anesthetic complications: no   No notable events documented.  Last Vitals:  Vitals:   01/07/22 1345 01/07/22 1410  BP: 139/79 140/81  Pulse: 85   Resp: 11 16  Temp:  36.7 C  SpO2: 99% 97%    Last Pain:  Vitals:   01/07/22 1345  TempSrc:   PainSc: 4                  Lidia Collum

## 2022-01-07 NOTE — Anesthesia Procedure Notes (Signed)
Anesthesia Regional Block: Pectoralis block   Pre-Anesthetic Checklist: , timeout performed,  Correct Patient, Correct Site, Correct Laterality,  Correct Procedure, Correct Position, site marked,  Risks and benefits discussed,  Surgical consent,  Pre-op evaluation,  At surgeon's request and post-op pain management  Laterality: Left  Prep: chloraprep       Needles:  Injection technique: Single-shot  Needle Type: Echogenic Stimulator Needle     Needle Length: 10cm  Needle Gauge: 20     Additional Needles:   Procedures:,,,, ultrasound used (permanent image in chart),,    Narrative:  Start time: 01/07/2022 7:36 AM End time: 01/07/2022 7:40 AM Injection made incrementally with aspirations every 5 mL.  Performed by: Personally  Anesthesiologist: Lidia Collum, MD  Additional Notes: Standard monitors applied. Skin prepped. Good needle visualization with ultrasound. Injection made in 5cc increments with no resistance to injection. Patient tolerated the procedure well.

## 2022-01-07 NOTE — Anesthesia Procedure Notes (Signed)
Anesthesia Regional Block: Pectoralis block   Pre-Anesthetic Checklist: , timeout performed,  Correct Patient, Correct Site, Correct Laterality,  Correct Procedure, Correct Position, site marked,  Risks and benefits discussed,  Surgical consent,  Pre-op evaluation,  At surgeon's request and post-op pain management  Laterality: Right  Prep: chloraprep       Needles:  Injection technique: Single-shot  Needle Type: Echogenic Stimulator Needle     Needle Length: 10cm  Needle Gauge: 20     Additional Needles:   Procedures:,,,, ultrasound used (permanent image in chart),,    Narrative:  Start time: 01/07/2022 7:32 AM End time: 01/07/2022 7:36 AM Injection made incrementally with aspirations every 5 mL.  Performed by: Personally  Anesthesiologist: Lidia Collum, MD  Additional Notes: Standard monitors applied. Skin prepped. Good needle visualization with ultrasound. Injection made in 5cc increments with no resistance to injection. Patient tolerated the procedure well.

## 2022-01-07 NOTE — Op Note (Signed)
Operative Note   DATE OF OPERATION: 2.28.2023  LOCATION: [facility]-observation  SURGICAL DIVISION: Plastic Surgery  PREOPERATIVE DIAGNOSES:  1. Left breast ca UIQ ER+ 2. History mantle radiation  POSTOPERATIVE DIAGNOSES:  same  PROCEDURE:  1. Bilateral breast reconstruction with tissue expanders 2. Acellular dermis ([employer]oderm) to bilateral chest 600 cm2  SURGEON: Irene Limbo MD MBA  ASSISTANT: none  ANESTHESIA:  General.   EBL: 250 ml for entire procedure  COMPLICATIONS: None immediate.   INDICATIONS FOR PROCEDURE:  The patient, Erika Hodge, is a 56 y.o. female born on Aug 27, 1966, is here for immediate prepectoral tissue expander based reconstruction  following nipple sparing mastectomies.   FINDINGS: Natrelle 133S FV-12-T 400 ml tissue expanders placed bilateral. Initial fill volume right 250 ml air, left 200 ml air. RIGHT SN [id number] LEFT SN [id number]  DESCRIPTION OF PROCEDURE:  The patient was marked with the patient in the preoperative area to mark sternal notch, chest midline, anterior axillary lines and inframammary folds. Following induction, Foley catheter placed. The patient's operative site was prepped and draped in a sterile fashion. A time out was performed and all information was confirmed to be correct. I assisted in mastectomies with exposure and retraction. Following completion of mastectomies, reconstruction began on the right side. Hemostasis ensured. Cavity irrigated with saline followed by saline solution containing Ancef, gentamicin, and Betadine. A 19 Fr drain was placed in subcutaneous position laterally and a 15 Fr drain placed along inframammary fold. Each secured to skin with 2-0 nylon. The tissue expanders were prepared on back table prior in insertion. The expander was filled with air. Perforated acellular dermis was draped over anterior surface expander. The ADM was then secured to itself over posterior surface of expander with 4-0 chromic.  Redundant folds acellular dermis excised so that the ADM lay flat without folds over air filled expander. The expander was secured to fascia over lateral sternal border with a 0 vicryl. The lateral tab was also secured to pectoralis muscle with 0-vicryl. The ADM was secured to pectoralis muscle and chest wall along inferior border at inframammary fold with 0 V-lock suture. Laterally the mastectomy flap over posterior axillary line was advanced anteriorly and the subcutaneous tissue and superficial fascia was secured to pectoralis and serratus muscles with 0-vicryl. Skin closure completed with 3-0 vicryl in fascial layer and 4-0 vicryl in dermis. Skin closure completed with 4-0 monocryl subcuticular and tissue adhesive.   I then directed my attention to left chest where similar irrigation and drain placement completed. The prepared expander with ADM secured over anterior surface was placed in left chest and tabs secured to chest wall and pectoralis muscle with 0- vicryl suture. The acellular dermis at inframammary fold was secured to chest wall with 0 V-lock suture. Laterally the mastectomy flap over posterior axillary line was advanced anteriorly and the subcutaneous tissue and superficial fascia was secured to pectoralis and serratus muscles with 0-vicryl. Skin closure completed with 3-0 vicryl in fascial layer and 4-0 vicryl in dermis. Skin closure completed with 4-0 monocryl subcuticular. The mastectomy flaps were redraped so that NAC was symmetric from sternal notch and chest midline. Patient returned to supine position. Tissue adhesive applied over all incisions. Tegaderm dressings applied followed by dry dressing, compression bra.  The patient was allowed to wake from anesthesia, extubated and taken to the recovery room in satisfactory condition.   SPECIMENS: none  DRAINS: 15 and 19 Fr JP in right and left breast reconstruction

## 2022-01-07 NOTE — Interval H&P Note (Signed)
History and Physical Interval Note:  01/07/2022 7:11 AM  Erika Hodge  has presented today for surgery, with the diagnosis of BREAST CANCER.  The various methods of treatment have been discussed with the patient and family. After consideration of risks, benefits and other options for treatment, the patient has consented to  Procedure(s): LEFT NIPPLE SPARING MASTECTOMY WITH SENTINEL LYMPH NODE BIOPSY (Left) RIGHT NIPPLE SPARING MASTECTOMY (Right) BREAST RECONSTRUCTION WITH PLACEMENT OF TISSUE EXPANDER AND ALLODERM (Bilateral) as a surgical intervention.  The patient's history has been reviewed, patient examined, no change in status, stable for surgery.  I have reviewed the patient's chart and labs.  Questions were answered to the patient's satisfaction.     [place]

## 2022-01-07 NOTE — Op Note (Signed)
Preoperative diagnosis: Left breast cancer upper outer quadrant stage I  Postoperative diagnosis: Same  Procedure: Left nipple sparing mastectomy with left axillary sentinel lymph node mapping using mag trace and right risk reducing nipple sparing mastectomy  Surgeon: Erroll Luna, MD  Anesthesia: LMA with bilateral pectoral blocks  EBL: 300 cc  Specimen: Left breast tissue/mastectomy to pathology and left nipple biopsy 3 left axillary sentinel nodes, right breast tissue right nipple biopsy  IV fluids: Per anesthesia record  Indications for procedure: The patient is a 56 year old female diagnosed with stage I left breast cancer.  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  Given that finding and meeting with the multidisciplinary team, she opted for bilateral mastectomies since she was at elevated risk of breast cancer given her previous radiation therapy.  She also saw plastic surgery desired reconstruction and chose nipple sparing mastectomy to consultation with them.  We discussed the risks and benefits of mastectomy.  We discussed flap tissue loss, nipple loss, failure of the reconstruction, infection, DVT, lymphedema, chronic shoulder pain, and the need further treatments and/or procedures.  Other less likely complications of death, DVT, and recurrence rates.The surgical and non surgical options have been discussed with the patient.  Risks of surgery include bleeding,  Infection,  Flap necrosis,  Tissue loss,  Chronic pain, death, Numbness,  And the need for additional procedures.  Reconstruction options also have been discussed with the patient as well.  The patient agrees to proceed.      Description of procedure: Patient was met in the holding area and questions were answered.  She underwent bilateral pectoral blocks per anesthesia.  The procedure was reviewed.  She had no further questions.  She was also seen preoperatively by plastic surgery.  She was then taken  back to the operating.  She was placed upon upon the OR table.  After induction of general esthesia, 2 cc of mag tracer were injected under the left nipple per protocol under sterile conditions and massaged for 5 minutes.  She was then prepped and draped in a sterile fashion.  A second timeout was performed.  Proper patient, site and procedure verified.  The right side was started first.  The patient was marked by plastic surgery.  An inferior mammary incision was made.  Dissection was carried down to the chest wall and to the pectoralis fashion.  The posterior aspect of the breast was elevated off the pectoralis muscle to include the pectoralis fascia to the midline, up to the clavicle and then lateral to the lateral attachments of the breast.  A superior skin flap was created using cautery.  Careful dissection with scissors around the nipple was used to prevent injury.  A nipple biopsy was sent and this was initially negative by frozen section.  The remainder the skin flap was developed toward the clavicle, toward the axilla toward the midline.  Once the top was encountered the breast was then amputated from superior attachments.  It was then oriented with stitches and sent to pathology.  Left side was then done next.  Neoprobe used to identify the hot node in the left axilla.  A 4 cm incision was made in left axilla.  Dissection was carried down.  There was 1 large and 2 smaller nodes at all had increased activity.  These were taken together.  Background counts approached baseline.  The long thoracic nerve, thoracodorsal trunk and axillary  vein were all preserved.  The wound was irrigated bleeding  made hemostatic with Arista.  The incision was closed with 3-0 Vicryl and 4 Monocryl.  The left side mastectomy was then performed.  In similar fashion an inframammary incision was made.  Dissection was carried down to the chest wall.  The breast was then elevated off the chest wall to include the pectoral fascia to  the midline, to the clavicle into the lateral attachments.  A superior skin flap was then created with cautery.  This was carefully dissected out around the nipple.  The entire breast was then dissected off the skin surface up to the clavicle, to the midline and then to the lateral attachments.  It was then amputated from the superior attachments.  Of note we took some additional tissue in the left upper outer quadrant from the skin which was dense fibrous tissue which appeared to be away from the tumor and this was taken which would represent more anterior tissue of that left upper outer quadrant.  Skin flaps were viable.  Irrigation was used and suctioned out.  Inspection for hemostasis was good.  There is no signs of any active bleeding.  The skin flaps look viable as well as the nipple.  At this portion of the case, I scrubbed out with the patient stable.  Plastic surgery was scrubbed and now continue reconstruction.  Please see their op notes for details.  At this point all counts were found to be correct and all specimens were sent to pathology.  Of note the frozen section from both nipples were initially read as negative for disease after review with pathology.

## 2022-01-07 NOTE — Anesthesia Procedure Notes (Signed)
Procedure Name: Intubation Date/Time: 01/07/2022 7:58 AM Performed by: Maryella Shivers, CRNA Pre-anesthesia Checklist: Patient identified, Emergency Drugs available, Suction available and Patient being monitored Patient Re-evaluated:Patient Re-evaluated prior to induction Oxygen Delivery Method: Circle system utilized Preoxygenation: Pre-oxygenation with 100% oxygen Induction Type: IV induction Ventilation: Mask ventilation without difficulty Laryngoscope Size: Mac and 3 Grade View: Grade I Tube type: Oral Tube size: 7.0 mm Number of attempts: 1 Airway Equipment and Method: Stylet and Oral airway Placement Confirmation: ETT inserted through vocal cords under direct vision, positive ETCO2 and breath sounds checked- equal and bilateral Secured at: 20 cm Tube secured with: Tape Dental Injury: Teeth and Oropharynx as per pre-operative assessment

## 2022-01-07 NOTE — Interval H&P Note (Signed)
History and Physical Interval Note:  01/07/2022 6:57 AM  Erika Hodge  has presented today for surgery, with the diagnosis of BREAST CANCER.  The various methods of treatment have been discussed with the patient and family. After consideration of risks, benefits and other options for treatment, the patient has consented to  Procedure(s): LEFT NIPPLE SPARING MASTECTOMY WITH SENTINEL LYMPH NODE BIOPSY (Left) RIGHT NIPPLE SPARING MASTECTOMY (Right) BREAST RECONSTRUCTION WITH PLACEMENT OF TISSUE EXPANDER AND ALLODERM (Bilateral) as a surgical intervention.  The patient's history has been reviewed, patient examined, no change in status, stable for surgery.  I have reviewed the patient's chart and labs.  Questions were answered to the patient's satisfaction.     [name] [name]

## 2022-01-07 NOTE — Transfer of Care (Signed)
Immediate Anesthesia Transfer of Care Note  Patient: Erika Hodge  Procedure(s) Performed: LEFT NIPPLE SPARING MASTECTOMY WITH SENTINEL LYMPH NODE BIOPSY (Left: Breast) RIGHT NIPPLE SPARING MASTECTOMY (Right: Breast) BILATERAL BREAST RECONSTRUCTION WITH PLACEMENT OF TISSUE EXPANDERS AND ALLODERM (Bilateral: Breast)  Patient Location: PACU  Anesthesia Type:GA combined with regional for post-op pain  Level of Consciousness: sedated  Airway & Oxygen Therapy: Patient Spontanous Breathing and Patient connected to face mask oxygen  Post-op Assessment: Report given to RN and Post -op Vital signs reviewed and stable  Post vital signs: Reviewed and stable  Last Vitals:  Vitals Value Taken Time  BP    Temp    Pulse 101 01/07/22 1237  Resp 19 01/07/22 1237  SpO2 95 % 01/07/22 1237  Vitals shown include unvalidated device data.  Last Pain:  Vitals:   01/07/22 0628  TempSrc: Oral  PainSc: 0-No pain      Patients Stated Pain Goal: 5 (50/27/74 1287)  Complications: No notable events documented.

## 2022-01-07 NOTE — H&P (Signed)
History of Present Illness: Erika Hodge is a 56 y.o. female who is seen today as an office consultation at the request of Dr. Pasty Arch for evaluation of Breast Cancer .   Patient presents the [facility] for evaluation of left breast cancer. She was noted to have a 1 cm mass by mammography left central breast overlapping sites. 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 Her mother had breast cancer she states. She had some bruising for biopsy but otherwise no other complaints  Review of Systems: A complete review of systems was obtained from the patient. I have reviewed this information and discussed as appropriate with the patient. See HPI as well for other ROS.    Medical History: Past Medical History:  Diagnosis Date   Anxiety   GERD (gastroesophageal reflux disease)   History of cancer   Patient Active Problem List  Diagnosis   Family history of breast cancer in mother   Past Surgical History:  Procedure Laterality Date   LAPAROSCOPIC TUBAL LIGATION N/A  2001    Allergies  Allergen Reactions   Codeine Nausea And Vomiting   Amoxicillin Rash   Current Outpatient Medications on File Prior to Visit  Medication Sig Dispense Refill   zolpidem (AMBIEN) 5 MG tablet Take 5 mg by mouth at bedtime as needed   No current facility-administered medications on file prior to visit.   Family History  Problem Relation Age of Onset   Breast cancer Mother   Hyperlipidemia (Elevated cholesterol) Brother   Diabetes Brother    Social History   Tobacco Use  Smoking Status Every Day   Types: Cigarettes  Smokeless Tobacco Not on file    Social History   Socioeconomic History   Marital status: Unknown  Tobacco Use   Smoking status: Every Day  Types: Cigarettes  Substance and Sexual Activity   Alcohol use: Yes  Comment: "1  per month"   Drug use: Never   Objective:  There were no vitals filed for this visit.  There is no height or weight on file to calculate BMI.  Physical Exam Constitutional:  Appearance: Normal appearance.  HENT:  Head: Normocephalic.  Eyes:  General: No scleral icterus. Pupils: Pupils are equal, round, and reactive to light.  Cardiovascular:  Rate and Rhythm: Normal rate.  Pulmonary:  Effort: Pulmonary effort is normal.  Breath sounds: No stridor.  Chest:  Breasts: Right: Normal.   Musculoskeletal:  General: Normal range of motion.  Cervical back: Normal range of motion.  Lymphadenopathy:  Upper Body:  Right upper body: No supraclavicular or axillary adenopathy.  Left upper body: No supraclavicular or axillary adenopathy.  Skin: General: Skin is warm.  Neurological:  General: No focal deficit present.  Mental Status: She is alert and oriented to person, place, and time.  Psychiatric:  Mood and Affect: Mood normal.  Behavior: Behavior normal.     Labs, Imaging and Diagnostic Testing: ADDITIONAL INFORMATION: PROGNOSTIC INDICATORS Results: IMMUNOHISTOCHEMICAL AND MORPHOMETRIC ANALYSIS PERFORMED MANUALLY The tumor cells are NEGATIVE for Her2 (1+). Estrogen Receptor: 10%, POSITIVE, WEAK STAINING INTENSITY Progesterone Receptor: 0%, NEGATIVE Proliferation Marker Ki67: 20% COMMENT: The negative hormone receptor study(ies) in this case has an internal positive control. REFERENCE RANGE ESTROGEN RECEPTOR NEGATIVE 0% POSITIVE =>1% REFERENCE RANGE PROGESTERONE RECEPTOR NEGATIVE 0% POSITIVE =>1% All controls stained appropriately Thressa Sheller MD Pathologist, Electronic Signature (  Signed 11/26/2021) FINAL DIAGNOSIS 1 of 3 FINAL for [facility] [place] ([id number]) Diagnosis Breast, left, needle core biopsy, 11 o'clock, ribbon clip - INVASIVE DUCTAL CARCINOMA - DUCTAL CARCINOMA IN SITU - SEE COMMENT Microscopic Comment based on the biopsy, the carcinoma appears  Nottingham grade 2 of 3 and measures 0.6 cm in greatest linear extent. Prognostic markers (ER/PR/ki-67/HER2) are pending and will be reported in an addendum. Dr. Alric Seton reviewed the case and agrees with the above diagnosis. These results were called to [facility] on November 21, 2021. Thressa Sheller MD Pathologist, Electronic Signature (Case signed 11/21/2021) Specimen Gross and Clinical Information Specimen Comment TIF: 8:30 am CIT: < 1 min, mass Specimen(s) Obtained: Breast, left, needle core biopsy, 11 o'clock, ribbon clip Specimen Clinical CLINICAL DATA: Screening recall for a possible left breast asymmetry.   EXAM: DIGITAL DIAGNOSTIC UNILATERAL LEFT MAMMOGRAM WITH TOMOSYNTHESIS AND CAD; ULTRASOUND LEFT BREAST LIMITED   TECHNIQUE: Left digital diagnostic mammography and breast tomosynthesis was performed. The images were evaluated with computer-aided detection.; Targeted ultrasound examination of the left breast was performed.   COMPARISON: Previous exam(s).   ACR Breast Density Category c: The breast tissue is heterogeneously dense, which may obscure small masses.   FINDINGS: On the diagnostic images, the possible asymmetry noted in the posterior, slightly medial aspect of the left breast on the current screening cc view, persists as a possible irregular mass. An irregular mass is also suggested on the diagnostic mL images in the upper slightly medial left breast near 11 o'clock, around 1 cm in greatest dimension. There is subtle associated architectural distortion suggested on the mL images.   On physical exam, no discrete mass is palpated in the upper inner left breast.   Targeted ultrasound is performed, showing an irregular hypoechoic mass with associated architectural distortion in the left breast at 11 o'clock, 2 cm the nipple, posterior depth, measuring 1.2 x 0.9 x 1.1 cm, consistent in size, shape and location to the  mammographic abnormality.   Sonographic imaging of the left axilla demonstrates normal lymph nodes. There are no enlarged or abnormal appearing lymph nodes.   IMPRESSION: 1. Irregular 1.2 cm mass in the left breast at 11 o'clock, 2 cm the nipple, suspicious for malignancy.   RECOMMENDATION: 1. Ultrasound-guided core needle biopsy of the 11 o'clock position, 1.2 cm left breast mass. This procedure was scheduled prior to the patient being discharged from the [facility].   I have discussed the findings and recommendations with the patient. If applicable, a reminder letter will be sent to the patient regarding the next appointment.   BI-RADS CATEGORY 4: Suspicious.     Electronically Signed By: Lajean Manes M.D. On: 11/05/2021 11:51 Assessment and Plan:   Diagnoses and all orders for this visit:  Malignant neoplasm of upper-outer quadrant of left breast in female, estrogen receptor positive (CMS-HCC)    Long discussion about treatment options. Of note she had mantle radiation back in 1980s for non-Hodgkin's lymphoma. She was seen by radiation oncology and they do not feel she is an appropriate candidate for post lumpectomy radiation therapy. We discussed the pros and cons of this and that in her case the standard of care would be a left simple mastectomy with or without reconstruction with left axillary sentinel lymph node mapping. She also has a weakly positive ER tumor but behaves more like a triple negative tumor. Medical oncology recommends chemotherapy. Recommend port placement. Due to small size units and interventions of neoadjuvant here this would not  change her care. She will think things over. I discussed reconstruction with her and she seems quite opposed to mastectomy. I explained there high recurrence rates with lumpectomy alone and that this could be done but is not the standard of care in someone in her situation. I discussed the pros and cons of mastectomy  complications of bleeding and infection as well as flap necrosis cosmesis excessive skin and the need for revisional surgery. Discussed port placement complications of bleeding infection as well as pneumothorax, hemothorax, injury to mediastinal structures, revisional surgery, catheter migration, catheter malfunction, and the need for treatments and/or procedures to revise this. Recommended a plastic surgery consultation as well if she is considering reconstruction. She is still undecided and will me know how to proceed after her and her husband talk more about this and after she meets with other consultants.  No follow-ups on file.  Kennieth Francois, MD

## 2022-01-07 NOTE — Progress Notes (Signed)
Assisted Dr. Christella Hartigan with right and left, ultrasound guided, pectoralis block. Side rails up, monitors on throughout procedure. See vital signs in flow sheet. Tolerated Procedure well.

## 2022-01-08 ENCOUNTER — Encounter (HOSPITAL_BASED_OUTPATIENT_CLINIC_OR_DEPARTMENT_OTHER): Payer: Self-pay | Admitting: Surgery

## 2022-01-08 DIAGNOSIS — C50212 Malignant neoplasm of upper-inner quadrant of left female breast: Secondary | ICD-10-CM | POA: Diagnosis not present

## 2022-01-08 NOTE — Addendum Note (Signed)
Addendum  created 01/08/22 1004 by Dudley Mages, Ernesta Amble, CRNA  ? Charge Capture section accepted  ?  ?

## 2022-01-08 NOTE — Discharge Summary (Signed)
Physician Discharge Summary  ?Patient ID: ?Erika Hodge ?MRN: [id number] ?DOB/AGE: [id number] 55 y.o. ? ?Admit date: 01/07/2022 ?Discharge date: 01/08/2022 ? ?Admission Diagnoses: ?Left breast cancer ? ?Discharge Diagnoses:  ?Principal Problem: ?  Breast cancer, left breast ([place]) ? ? ?Discharged Condition: stable ? ?Hospital Course: Postoperatively patient's pain was controlled with oral medication. Patient tolerated diet and was ambulatory with minimal assist. Instructed on bathing and drain care ? ?Treatments: surgery: bilateral nipple sparing mastectomies left sentinel node bilateral breast reconstruction with tissue expanders acellular dermis 2.28.23 ? ?Discharge Exam: ?Blood pressure 137/68, pulse 95, temperature 98.3 ?F (36.8 ?C), resp. rate 18, height 5\' 3"  (1.6 m), weight 79.7 kg, SpO2 95 %. ?Incision/Wound: chest soft incisions dry intact tegaderms in place JPs serosanguinous ? ?Disposition: Discharge disposition: 01-Home or Self Care ? ? ? ? ? ? ?Discharge Instructions   ? ? Call MD for:  redness, tenderness, or signs of infection (pain, swelling, bleeding, redness, odor or green/yellow discharge around incision site)   Complete by: As directed ?  ? Call MD for:  temperature >100.5   Complete by: As directed ?  ? Discharge instructions   Complete by: As directed ?  ? Ok to remove dressings and shower am 3.2.23 Soap and water ok, pat Tegaderms dry. Do not remove Tegaderms. No creams or ointments over incisions. Do not let drains dangle in shower, attach to lanyard or similar.Strip and record drains twice daily and bring log to clinic visit. ? ?Breast binder or soft compression bra all other times. ? ?Ok to raise arms above shoulders for bathing and dressing.  No house yard work or exercise until cleared by MD.  ? ?Recommend ibuprofen with meals to aid with pain control. Also ok to use Tylenol for pain. Recommend Miralax or Dulcolax as needed for constipation. Patient received all Rx preop.  ? Driving  Restrictions   Complete by: As directed ?  ? No driving for 2 weeks then no driving if taking prescription pain medication  ? Lifting restrictions   Complete by: As directed ?  ? No lifting > 5-10 lbs until cleared by MD  ? Resume previous diet   Complete by: As directed ?  ? ?  ? ?Allergies as of 01/08/2022   ? ?   Reactions  ? Amoxicillin Rash, Other (See Comments)  ? Throat closing  ? Codeine Nausea Only  ? ?  ? ?  ?Medication List  ?  ? ?TAKE these medications   ? ?Apple Cider Vinegar 300 MG Tabs ?Take 1 tablet by mouth daily. ?  ?ascorbic acid 250 MG Chew ?Commonly known as: VITAMIN C ?Chew 250 mg by mouth daily. ?  ?ASHWAGANDHA PO ?Take 1 tablet by mouth daily. ?  ?DIGESTIVE ADV MULTI-STRAIN PO ?Take 1 tablet by mouth daily. ?  ?EMERGEN-C SUPER FRUIT PO ?Take 1 tablet by mouth daily. ?  ?Enstilar 0.005-0.064 % Foam ?Generic drug: Calcipotriene-Betameth Diprop ?Apply 1 application topically as needed (apply to psoriasis area as needed). ?  ?FLUoxetine 20 MG capsule ?Commonly known as: PROZAC ?Take 20 mg by mouth as needed. ?  ?lisdexamfetamine 50 MG capsule ?Commonly known as: VYVANSE ?Take 50 mg by mouth as needed. ?  ?loratadine 10 MG tablet ?Commonly known as: CLARITIN ?Take 10 mg by mouth daily. ?  ?omeprazole 40 MG capsule ?Commonly known as: PRILOSEC ?Take 40 mg by mouth daily. ?  ?zolpidem 5 MG tablet ?Commonly known as: AMBIEN ?Take 5 mg by mouth at bedtime as needed for  sleep. ?  ? ?  ? ? Follow-up Information   ? ? Irene Limbo, MD Follow up in 1 week(s).   ?Specialty: Plastic Surgery ?Why: as scheduled ?Contact information: ?[address] ?[address] ?[place] [address] ?[phone] ? ? ?  ?  ? ? Cornett, Marcello Moores, MD. Schedule an appointment as soon as possible for a visit in 2 week(s).   ?Specialty: General Surgery ?Contact information: ?[address] ?[address] ?[place] [address] ?[phone] ? ? ?  ?  ? ?  ?  ? ?  ? ? ?Signed: [name] Nikkolas Coomes ?01/08/2022, 8:00 AM ? ? ?

## 2022-01-08 NOTE — Discharge Instructions (Signed)

## 2022-01-14 ENCOUNTER — Encounter: Payer: Self-pay | Admitting: *Deleted

## 2022-01-14 ENCOUNTER — Telehealth: Payer: Self-pay | Admitting: *Deleted

## 2022-01-14 NOTE — Telephone Encounter (Signed)
Requested repeat prognostics on final surgical path from pathology. ?

## 2022-01-15 ENCOUNTER — Encounter: Payer: Self-pay | Admitting: *Deleted

## 2022-01-15 LAB — SURGICAL PATHOLOGY

## 2022-01-23 ENCOUNTER — Other Ambulatory Visit: Payer: Self-pay

## 2022-01-24 ENCOUNTER — Inpatient Hospital Stay: Payer: BC Managed Care – PPO | Attending: Hematology | Admitting: Hematology

## 2022-01-24 VITALS — BP 148/74 | HR 72 | Temp 97.5°F | Resp 17 | Wt 177.1 lb

## 2022-01-24 DIAGNOSIS — Z8572 Personal history of non-Hodgkin lymphomas: Secondary | ICD-10-CM | POA: Insufficient documentation

## 2022-01-24 DIAGNOSIS — Z170 Estrogen receptor positive status [ER+]: Secondary | ICD-10-CM

## 2022-01-24 DIAGNOSIS — C50212 Malignant neoplasm of upper-inner quadrant of left female breast: Secondary | ICD-10-CM

## 2022-01-24 DIAGNOSIS — C50911 Malignant neoplasm of unspecified site of right female breast: Secondary | ICD-10-CM

## 2022-01-24 DIAGNOSIS — Z171 Estrogen receptor negative status [ER-]: Secondary | ICD-10-CM | POA: Insufficient documentation

## 2022-01-24 HISTORY — DX: Malignant neoplasm of unspecified site of right female breast: C50.911

## 2022-01-24 NOTE — Progress Notes (Addendum)
?[name]   ?Telephone:(336) [phone] [phone]   ?Clinic Follow up Note  ? ?Patient Care Team: ?Bonnita Nasuti, MD as PCP - General (Internal Medicine) ?Erroll Luna, MD as Consulting Physician (General Surgery) ?Truitt Merle, MD as Consulting Physician (Hematology) ?Gery Pray, MD as Consulting Physician (Radiation Oncology) ?Mauro Kaufmann, RN as Oncology Nurse Navigator ?Rockwell Germany, RN as Oncology Nurse Navigator ? ?Date of Service:  01/24/2022 ? ?CHIEF COMPLAINT: f/u of bilateral breast cancers ? ?CURRENT THERAPY:  ?Pending ? ?ASSESSMENT & PLAN:  ?Erika Hodge is a 56 y.o. post-menopausal female with  ? ?1. 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 ?-she opted to proceed with b/l mastectomies with reconstruction on 01/07/22 with Dr. Brantley Stage and Dr. Iran Planas. Pathology revealed b/l breast cancers. Left breast showed 2.1 cm invasive and in situ ductal carcinoma. Margins and lymph nodes negative (0/2). I reviewed the pathology results with them today. Repeated ER/PR/HER2 were all negative (ER 10% weak (+) on biopsy) ?-Given her triple negative disease, the recommendation is for adjuvant chemotherapy. Given T2 disease, I recommend more aggressive treatment with CarboTaxol for 12 weeks followed by Adriamycin and Cytoxan every 2 weeks for 4 cycles.  She is not a candidate for immunotherapy Keytruda due to her severe psoriasis, and benefit of Beryle Flock is adjuvant stage II is also probably small. She also previously received systemic treatment for her lymphoma; we will investigate into what drugs she received.  I also discussed the option of docetaxel and Cytoxan every 3 weeks for 4-6 cycles, if she previously received anthracycline or if she is not willing to take anthracycline now.  She is very against port placement today and  became tearful. Her husband encouraged her to have a port placed for chemo.  ?-the patient was very overwhelmed during our conversation.  I provided emotional support  ?-physical exam showed some necrotic skin tissue to her left breast incision. She is scheduled to follow up with Dr. Iran Planas next week.  ?-Plan to start adjuvant chemotherapy in 2 to 3 weeks when she recovers well from surgery. ? ?2. Invasive lobular carcinoma of right breast, stage I pT1c, ER weakly+/PR+/Her2-, and DCIS  ?-incidental finding on b/l mastectomies on 01/07/22 for left breast cancer. Pathology showed: 1.1 cm invasive lobular carcinoma, grade 1, in LIQ; 1 cm DCIS, intermediate grade, in UIQ. ILC showed ER 30% weakly positive, PR 80% moderately positive, Her2 negative. ILC focally involves adjacent anterior margin. I reviewed with them today. ?-I will reach out to Dr. Sondra Come to confirm she cannot receive radiation to either breast. ?  ?3. H/o Non-Hodgkin's Lymphoma ?-in 1980's (pt was in 8th grade), received radiation to the mid chest. ?-she tells me today she also received three years of systemic treatment. She reports she was treated at [facility]; I will attempt to retrieve these records to determine what she received. ?  ?4. Psoriasis  ?-previously on Tremfya injections, was told by dermatology that she could not restart due to h/o cancer. ?-I do not recommend immunotherapy because of this. I reviewed with them again today. ?-followed by Dr. Abner Greenspan with [facility], per pt ?  ?  ?PLAN:  ?-f/u with Dr. Iran Planas next week  ?-will discuss radiation with Dr. Sondra Come ?-will see if I can obtain hospital records from her prior chemo treatment ~1982, to see if she previously  received anthracycline for her lymphoma ?-Patient will think about a port placement ?-I will call her in a week to discuss and finalize her treatment plan. ? ? ?No problem-specific Assessment & Plan notes found for this encounter. ? ? ?SUMMARY OF ONCOLOGIC  HISTORY: ?Oncology History Overview Note  ? Cancer Staging  ?Invasive lobular carcinoma of right breast, stage 1 (HCC) ?Staging form: Breast, AJCC 8th Edition ?- Clinical stage from 01/07/2022: Stage Unknown (cT1c, cNX, cM0, G1, ER+, PR+, HER2-) - Signed by Truitt Merle, MD on 01/24/2022 ? ?Malignant neoplasm of upper-inner quadrant of left breast in female, estrogen receptor positive ([place]) ?Staging form: Breast, AJCC 8th Edition ?- Clinical stage from 11/20/2021: Stage IA (cT1c, cN0, cM0, G2, ER+, PR-, HER2-) - Signed by Truitt Merle, MD on 11/26/2021 ?- Pathologic stage from 01/07/2022: Stage IIA (pT2, pN0, cM0, G2, ER-, PR-, HER2-) - Signed by Truitt Merle, MD on 01/24/2022 ? ? ?  ?Malignant neoplasm of upper-inner quadrant of left breast in female, estrogen receptor positive ([place])  ?11/05/2021 Mammogram  ? CLINICAL DATA:  Screening recall for a possible left breast ?asymmetry. ?  ?EXAM: ?DIGITAL DIAGNOSTIC UNILATERAL LEFT MAMMOGRAM WITH TOMOSYNTHESIS AND CAD; ULTRASOUND LEFT BREAST LIMITED ? ?IMPRESSION: ?1. Irregular 1.2 cm mass in the left breast at 11 o'clock, 2 cm the ?nipple, suspicious for malignancy. ?  ?11/20/2021 Cancer Staging  ? Staging form: Breast, AJCC 8th Edition ?- Clinical stage from 11/20/2021: Stage IA (cT1c, cN0, cM0, G2, ER+, PR-, HER2-) - Signed by Truitt Merle, MD on 11/26/2021 ?Stage prefix: Initial diagnosis ?Histologic grading system: 3 grade system ? ?  ?11/20/2021 Initial Biopsy  ? Diagnosis ?Breast, left, needle core biopsy, 11 o'clock, ribbon clip ?- INVASIVE DUCTAL CARCINOMA ?- DUCTAL CARCINOMA IN SITU ?- SEE COMMENT ?Microscopic Comment ?based on the biopsy, the carcinoma appears Nottingham grade 2 of 3 and measures 0.6 cm in greatest linear extent. ? ?PROGNOSTIC INDICATORS ?Results: ?The tumor cells are NEGATIVE for Her2 (1+). ?Estrogen Receptor: 10%, POSITIVE, WEAK STAINING INTENSITY ?Progesterone Receptor: 0%, NEGATIVE ?Proliferation Marker Ki67: 20% ?  ?11/26/2021 Initial Diagnosis  ? Malignant  neoplasm of upper-inner quadrant of left breast in female, estrogen receptor positive ([place]) ?  ?11/27/2021 Genetic Testing  ? [facility] was Negative. Report date is 12/11/2021. ? ?The CancerNext gene panel offered by [employer] includes sequencing, rearrangement analysis, and RNA analysis for the following 36 genes:   APC, ATM, AXIN2, BARD1, BMPR1A, BRCA1, BRCA2, BRIP1, CDH1, CDK4, CDKN2A, CHEK2, DICER1, HOXB13, EPCAM, GREM1, MLH1, MSH2, MSH3, MSH6, MUTYH, NBN, NF1, NTHL1, PALB2, PMS2, POLD1, POLE, PTEN, RAD51C, RAD51D, RECQL, SMAD4, SMARCA4, STK11, and TP53.  ?  ?01/07/2022 Cancer Staging  ? Staging form: Breast, AJCC 8th Edition ?- Pathologic stage from 01/07/2022: Stage IIA (pT2, pN0, cM0, G2, ER-, PR-, HER2-) - Signed by Truitt Merle, MD on 01/24/2022 ?Stage prefix: Initial diagnosis ?Histologic grading system: 3 grade system ?Residual tumor (R): R0 - None ? ?  ?01/07/2022 Definitive Surgery  ? FINAL MICROSCOPIC DIAGNOSIS:  ? ?A. BREAST, RIGHT NIPPLE, BIOPSY:  ?- Benign breast tissue.  ?- No malignancy identified.  ? ?B. BREAST, LEFT NIPPLE, BIOPSY:  ?- Benign breast tissue.  ?- No malignancy identified.  ? ?C. BREAST, RIGHT, MASTECTOMY:  ?- Invasive lobular carcinoma, 1.1 cm.  ?- Ductal carcinoma in situ, 1 cm.  ?- Invasive carcinoma focally extends to the anterior margin.  ?- Fibrocystic changes with usual ductal hyperplasia and calcifications.  ?- Sclerosing adenosis and fibroadenomatoid change.  ?- See oncology table and comment.  ? ?  D. LYMPH NODE, LEFT AXILLARY, SENTINEL, EXCISION:  ?- One lymph node negative for metastatic carcinoma (0/1).  ? ?E. LYMPH NODE, LEFT AXILLARY, SENTINEL, EXCISION:  ?- One lymph node negative for metastatic carcinoma (0/1).  ? ?F. BREAST, LEFT, MASTECTOMY:  ?- Invasive and in situ ductal carcinoma, 2.1 cm.  ?- Margins negative for carcinoma.  ?- See oncology table.  ?  ?01/07/2022 Receptors her2  ? F1. PROGNOSTIC INDICATOR RESULTS:  ? ?The tumor cells are NEGATIVE for Her2 (0).   ? ?Estrogen Receptor: NEGATIVE  ?Progesterone Receptor: NEGATIVE  ?Proliferation Marker Ki-67: 10%  ?  ?Invasive lobular carcinoma of right breast, stage 1 ([place])  ?11/27/2021 Genetic Testing  ? [facility] [employer] was [employer]

## 2022-01-25 ENCOUNTER — Encounter: Payer: Self-pay | Admitting: Hematology

## 2022-01-27 ENCOUNTER — Encounter: Payer: Self-pay | Admitting: *Deleted

## 2022-02-03 ENCOUNTER — Ambulatory Visit: Payer: BC Managed Care – PPO | Attending: Surgery | Admitting: Physical Therapy

## 2022-02-03 ENCOUNTER — Encounter: Payer: Self-pay | Admitting: Physical Therapy

## 2022-02-03 ENCOUNTER — Other Ambulatory Visit: Payer: Self-pay

## 2022-02-03 DIAGNOSIS — R293 Abnormal posture: Secondary | ICD-10-CM | POA: Diagnosis present

## 2022-02-03 DIAGNOSIS — Z170 Estrogen receptor positive status [ER+]: Secondary | ICD-10-CM | POA: Insufficient documentation

## 2022-02-03 DIAGNOSIS — Z483 Aftercare following surgery for neoplasm: Secondary | ICD-10-CM | POA: Diagnosis present

## 2022-02-03 DIAGNOSIS — C50212 Malignant neoplasm of upper-inner quadrant of left female breast: Secondary | ICD-10-CM | POA: Insufficient documentation

## 2022-02-03 NOTE — Patient Instructions (Addendum)
[facility] ? [address], [address] ? [place] [address] ? [phone] ? ?After Breast Cancer Class ?It is recommended you attend the ABC class to be educated on lymphedema risk reduction. This class is free of charge and lasts for 1 hour. It is a 1-time class. You will need to download the Webex app either on your phone or computer. We will send you a link the night before or the morning of the class. You should be able to click on that link to join the class. This is not a confidential class. You don't have to turn your camera on, but other participants may be able to see your email address. You are scheduled for February 10, 2022 at 11:00. ? ?Scar massage ?You can begin gentle scar massage to you incision sites. Gently place one hand on the incision and move the skin (without sliding on the skin) in various directions. Do this for a few minutes and then you can gently massage either coconut oil or vitamin E cream into the scars. You can begin this on your drain sites and the left armpit one. ? ?Compression garment ?You should continue wearing your compression bra until you feel like you no longer have swelling. ? ?Home exercise Program ?Continue doing the exercises you were given until you feel like you can do them without feeling any tightness at the end.  ? ?Walking Program ?Studies show that 30 minutes of walking per day (fast enough to elevate your heart rate) can significantly reduce the risk of a cancer recurrence. If you can't walk due to other medical reasons, we encourage you to find another activity you could do (like a stationary bike or water exercise). ? ?Posture ?After breast cancer surgery, people frequently sit with rounded shoulders posture because it puts their incisions on slack and feels better. If you sit like this and scar tissue forms in that position, you can become very tight and have pain sitting or standing with good posture. Try to be aware of your posture and  sit and stand up tall to heal properly. ? ?Follow up PT: ?It is recommended you return every 3 months for the first 3 years following surgery to be assessed on the SOZO machine for an L-Dex score. This helps prevent clinically significant lymphedema in 95% of patients. These follow up screens are 10 minute appointments that you are not billed for. You are scheduled for June 5th at 2:10 PM. ?

## 2022-02-03 NOTE — Therapy (Signed)
?OUTPATIENT PHYSICAL THERAPY BREAST CANCER POST OP FOLLOW UP ? ? ?Patient Name: Erika Hodge ?MRN: [id number] ?DOB:1966/09/29, 56 y.o., female ?Today's Date: 02/03/2022 ? ? PT End of Session - 02/03/22 1301   ? ? Visit Number 1   ? Number of Visits 10   ? Date for PT Re-Evaluation 03/03/22   ? PT Start Time 1302   ? PT Stop Time 1410   ? PT Time Calculation (min) 68 min   ? Activity Tolerance Patient tolerated treatment well   ? Behavior During Therapy [facility] for tasks assessed/performed   ? ?  ?  ? ?  ? ? ?Past Medical History:  ?Diagnosis Date  ? Anxiety   ? Complication of anesthesia   ? HA  ? Non Hodgkin's lymphoma ([place])   ? Psoriasis   ? ?Past Surgical History:  ?Procedure Laterality Date  ? BREAST BIOPSY Left 11/20/2021  ? BREAST RECONSTRUCTION WITH PLACEMENT OF TISSUE EXPANDER AND ALLODERM Bilateral 01/07/2022  ? Procedure: BILATERAL BREAST RECONSTRUCTION WITH PLACEMENT OF TISSUE EXPANDERS AND ALLODERM;  Surgeon: Irene Limbo, MD;  Location: [facility];  Service: Plastics;  Laterality: Bilateral;  ? NIPPLE SPARING MASTECTOMY Right 01/07/2022  ? Procedure: RIGHT NIPPLE SPARING MASTECTOMY;  Surgeon: Erroll Luna, MD;  Location: [facility];  Service: General;  Laterality: Right;  ? NIPPLE SPARING MASTECTOMY WITH SENTINEL LYMPH NODE BIOPSY Left 01/07/2022  ? Procedure: LEFT NIPPLE SPARING MASTECTOMY WITH SENTINEL LYMPH NODE BIOPSY;  Surgeon: Erroll Luna, MD;  Location: [facility];  Service: General;  Laterality: Left;  ? TUBAL LIGATION    ? ?Patient Active Problem List  ? Diagnosis Date Noted  ? Invasive lobular carcinoma of right breast, stage 1 ([place]) 01/24/2022  ? Genetic testing 12/11/2021  ? Family history of breast cancer 11/29/2021  ? Malignant neoplasm of upper-inner quadrant of left breast in female, estrogen receptor positive ([place]) 11/26/2021  ? ? ?PCP: Bonnita Nasuti, MD ? ?REFERRING PROVIDER: Erroll Luna, MD ? ?REFERRING DIAG: s/p bil  mastectomies with left SLNB ? ?THERAPY DIAG: Malignant neoplasm of upper-inner quadrant of left breast in female, estrogen receptor positive ([facility]) ?  ?Abnormal posture; aftercare of malignant neoplasm ? ? ?ONSET DATE: 01/07/2022 ? ?SUBJECTIVE:                                                                                                                                                                                          ? ?SUBJECTIVE STATEMENT: ?Patient reports she underwent bilateral mastectomies for left triple negative invasive ductal carcinoma breast cancer on 01/07/2022. Two negative axillary nodes were removed on the left side.  An incidental finding of right breast invasive lobular carcinoma was also found and is ER/PR positive and HER2 negative. She had immediate reconstruction during the time of her surgery. She will undergo chemotherapy but may not be a candidate for radiation due to previous radiation from non-Hodgkins Lymphoma in 1985. Drains were removed 2 weeks ago. ? ?PERTINENT HISTORY:  ?Patient was diagnosed on 09/10/2021 with 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 Two negative axillary nodes were removed on the left side. An incidental finding of right breast invasive lobular carcinoma was also found and is ER/PR positive and HER2 negative. It is weakly ER positive, PR negative and HER2 negative with a Ki67 of 20%. She has a left wrist plate from a previous surgery in 2006 and a history of non-Hodgkins Lymphoma in 1985. ? ?PATIENT GOALS:  Reassess how my recovery is going related to arm function, pain, and swelling. ? ?PAIN:  ?Are you having pain? Yes: NPRS scale: 8/10 ?Pain location: Across chest and left axilla ?Pain description: Throbbing, burning, sharp, tight ?Aggravating factors: Not taking pain meds ?Relieving factors: Taking pain meds ? ?PRECAUTIONS: Recent  Surgery, bilateral UE Lymphedema risk, previous radiation for lymphoma ? ?ACTIVITY LEVEL / LEISURE: She is not exercising ? ? ?OBJECTIVE:  ? ?PATIENT SURVEYS:  ?QUICK DASH: ? Quick Dash - 02/03/22 0001   ? ? Open a tight or new jar Severe difficulty   ? Do heavy household chores (wash walls, wash floors) Moderate difficulty   ? Carry a shopping bag or briefcase Moderate difficulty   ? Wash your back Severe difficulty   ? Use a knife to cut food Mild difficulty   ? Recreational activities in which you take some force or impact through your arm, shoulder, or hand (golf, hammering, tennis) Severe difficulty   ? During the past week, to what extent has your arm, shoulder or hand problem interfered with your normal social activities with family, friends, neighbors, or groups? Modererately   ? During the past week, to what extent has your arm, shoulder or hand problem limited your work or other regular daily activities Modererately   ? Arm, shoulder, or hand pain. Moderate   ? Tingling (pins and needles) in your arm, shoulder, or hand Mild   ? Difficulty Sleeping Severe difficulty   ? DASH Score 54.55 %   ? ?  ?  ? ?  ? ? ? ?OBSERVATIONS: ? Right breast with increased edema compared to left. Left inferior breast incision appears to be slightly macerated. Non-stick pad applied under each breast to reduce fluid leakage. Left axillary incision appears to be healing well.  ? ?POSTURE:  ?Forward head, rounded shoulders ? ?LYMPHEDEMA ASSESSMENT:  ? ?A/PROM Right ?11/27/2021 Left ?11/27/2021 Right ?02/03/2022 Left  ?02/03/2022  ?Shoulder extension 38 48 39 32  ?Shoulder flexion 157 152 100 51  ?Shoulder abduction 165 163 60 58  ?Shoulder internal rotation 67 62 63 NT  ?Shoulder external rotation 77 77 84 NT  ?                        (Blank rows = not tested) ?  ?  ?  ?CERVICAL AROM: ?All within normal limits ?  ?UPPER EXTREMITY STRENGTH: Not tested ?  ?  ?LYMPHEDEMA ASSESSMENTS:  ?  ?LANDMARK RIGHT ?11/27/2021 LEFT ?11/27/2021  RIGHT ?02/03/2022 LEFT ?02/03/2022  ?10 cm proximal to olecranon process   32 31.9 31.8 31.5  ?Olecranon process 27 27 27.7 26.4  ?10 cm proximal to ulnar styloid process 23.7 23 23.3 22.7  ?Just proximal to ulnar styloid process 16.4 16.8 16.1 16.7  ?Across hand at thumb web space 19 18.7 19 18.8  ?At base of 2nd digit 6.8 7.5 6.7 7.5  ?(Blank rows = not tested) ?  ? ?  ?Surgery type/Date: 01/07/2022 ?Number of lymph nodes removed: 2 on left ?Current/past treatment (chemo, radiation, hormone therapy): Radiation to chest for Non-hodgkins lymphoma in 1985 ?Other symptoms:  ?Heaviness/tightness Yes ?Pain Yes ?Pitting edema No ?Infections No ?Decreased scar mobility Yes ?Stemmer sign No ? ? ?PATIENT EDUCATION:  ?Education details: HEP ?Person educated: Patient and Spouse ?Education method: Explanation, Demonstration, and Handouts ?Education comprehension: verbalized understanding and returned demonstration ? ? ?HOME EXERCISE PROGRAM: ? Reviewed previously given post op HEP. ? ?ASSESSMENT: ? ?CLINICAL IMPRESSION: ?Patient is doing fairly well s/p bilateral mastectomy with left sentinel node biopsy and reconstruction with expanders. She has left triple negative breast cancer and was having her right mastectomy for prophylactic reasons but was diagnosed with right invasive lobular carcinoma on the right side. She will undergo chemotherapy followed by radiation therapy and anti-estrogen therapy. She is lacking significant shoulder ROM, has edema and significant pain limiting function. She will benefit from PT to address those concerns. ? ?Pt will benefit from skilled therapeutic intervention to improve on the following deficits: Decreased knowledge of precautions, impaired UE functional use, pain, decreased ROM, postural dysfunction.  ? ?PT treatment/interventions: ADL/Self care home management, Therapeutic exercises, Therapeutic activity, Neuromuscular re-education, Balance training, Gait training, Patient/Family education,  and Joint mobilization ? ? ? ? ?GOALS: ?Goals reviewed with patient? Yes ? ?LONG TERM GOALS:  (STG=LTG) ? ?GOALS Name Target Date Goal status  ?1 Pt will demonstrate she has regained full shoulder ROM and function post ope

## 2022-02-04 ENCOUNTER — Telehealth: Payer: Self-pay

## 2022-02-04 ENCOUNTER — Telehealth: Payer: Self-pay | Admitting: Hematology

## 2022-02-04 ENCOUNTER — Encounter: Payer: Self-pay | Admitting: *Deleted

## 2022-02-04 ENCOUNTER — Other Ambulatory Visit: Payer: Self-pay | Admitting: Hematology

## 2022-02-04 DIAGNOSIS — Z170 Estrogen receptor positive status [ER+]: Secondary | ICD-10-CM

## 2022-02-04 DIAGNOSIS — C50911 Malignant neoplasm of unspecified site of right female breast: Secondary | ICD-10-CM

## 2022-02-04 MED ORDER — ONDANSETRON HCL 8 MG PO TABS: 8.0000 mg | ORAL_TABLET | Freq: Two times a day (BID) | ORAL | 1 refills | Status: DC | PRN

## 2022-02-04 MED ORDER — DEXAMETHASONE 4 MG PO TABS: 4.0000 mg | ORAL_TABLET | Freq: Two times a day (BID) | ORAL | 1 refills | Status: DC

## 2022-02-04 MED ORDER — PROCHLORPERAZINE MALEATE 10 MG PO TABS: 10.0000 mg | ORAL_TABLET | Freq: Four times a day (QID) | ORAL | 1 refills | Status: DC | PRN

## 2022-02-04 NOTE — Telephone Encounter (Signed)
We have attempted to retrieve her previous medical records for her non-Hodgkin lymphoma treatment when she was a teenager, but fortunately it was not successful.  I called patient today, since Adriamycin is a common drug for NHL treatment, I will not recommend AC-T regimen for her breast cancer at this time.  I recommend docetaxel and Cytoxan (TC) every 3 weeks for total of 6 cycles.  Patient agrees to proceed.  She declined port placement.  I will schedule her chemo class, and for cycle TC next week.  I also ordered right axillary ultrasound to be done in the next few weeks.  She has recently seen her breast surgeon Dr. Brantley Stage and plastic surgeon Dr. Iran Planas, her surgical incisions have healed well.  She is ready to start chemo.  All questions were answered. ? ?Truitt Merle  ?02/04/2022  ?

## 2022-02-04 NOTE — Progress Notes (Signed)
START ON PATHWAY REGIMEN - Breast ? ? ?  A cycle is every 21 days: ?    Docetaxel  ?    Cyclophosphamide  ? ?**Always confirm dose/schedule in your pharmacy ordering system** ? ?Patient Characteristics: ?Postoperative without Neoadjuvant Therapy (Pathologic Staging), Invasive Disease, Adjuvant Therapy, HER2 Negative/Unknown/Equivocal, ER Negative/Unknown, Node Negative, pT1a-c, N1mi or pT1c or Higher, pN0 ?Therapeutic Status: Postoperative without Neoadjuvant Therapy (Pathologic Staging) ?AJCC Grade: G3 ?AJCC N Category: pN0 ?AJCC M Category: cM0 ?ER Status: Negative (-) ?AJCC 8 Stage Grouping: IIA ?HER2 Status: Negative (-) ?Oncotype Dx Recurrence Score: Not Appropriate ?AJCC T Category: pT2 ?PR Status: Negative (-) ?Adjuvant Therapy Status: No Adjuvant Therapy Received Yet or Changing Initial Adjuvant Regimen due to Tolerance ?Intent of Therapy: ?Curative Intent, Discussed with Patient ?

## 2022-02-04 NOTE — Telephone Encounter (Signed)
[id number] - TREATMENT OF REFRACTORY NAUSEA ? ?Received referral for above-listed study from Dr Burr Medico. Reviewed patient records and noted that she had chemotherapy as an adolescent to treat non-hodgkin's lymphoma. Per protocol, patients must be chemotherapy naive, so patient is not eligible. Called patient to confirm she had received prior systemic therapy. Informed patient that this disqualified her from this study but research would contact her if another study may be appropriate in the future. Patient verbalized understanding. Will alert referring MD. ? ?Vickii Penna, RN, BSN, CPN ?Clinical Research Nurse I ?[phone] ? ?02/04/2022 11:14 AM ? ?

## 2022-02-06 ENCOUNTER — Telehealth: Payer: Self-pay | Admitting: Hematology

## 2022-02-06 NOTE — Telephone Encounter (Signed)
.  Called patient to schedule appointment per 3/30 inbasket, patient is aware of date and time.   ?

## 2022-02-06 NOTE — Progress Notes (Signed)
Pharmacist Chemotherapy Monitoring - Initial Assessment   ? ?Anticipated start date: 02/13/22  ? ?The following has been reviewed per standard work regarding the patient's treatment regimen: ?The patient's diagnosis, treatment plan and drug doses, and organ/hematologic function ?Lab orders and baseline tests specific to treatment regimen  ?The treatment plan start date, drug sequencing, and pre-medications ?Prior authorization status  ?Patient's documented medication list, including drug-drug interaction screen and prescriptions for anti-emetics and supportive care specific to the treatment regimen ?The drug concentrations, fluid compatibility, administration routes, and timing of the medications to be used ?The patient's access for treatment and lifetime cumulative dose history, if applicable  ?The patient's medication allergies and previous infusion related reactions, if applicable  ? ?Changes made to treatment plan:  ?treatment plan date ? ?Follow up needed:  ?Pending authorization for treatment  ?Need inj appt ~ 02/15/22. ? ? ?Kennith Center, Pharm.D., CPP ?02/06/2022'@3'$ :34 PM ? ? ? ?

## 2022-02-07 ENCOUNTER — Telehealth: Payer: Self-pay | Admitting: Hematology

## 2022-02-07 NOTE — Telephone Encounter (Signed)
Rescheduled upcoming appointment due to template availability. Patient is aware of changes. ?

## 2022-02-10 ENCOUNTER — Inpatient Hospital Stay: Payer: BC Managed Care – PPO

## 2022-02-11 ENCOUNTER — Other Ambulatory Visit: Payer: Self-pay | Admitting: Hematology

## 2022-02-11 ENCOUNTER — Other Ambulatory Visit: Payer: Self-pay

## 2022-02-11 ENCOUNTER — Ambulatory Visit
Admission: RE | Admit: 2022-02-11 | Discharge: 2022-02-11 | Disposition: A | Discharge status: Home / Self Care | Payer: BC Managed Care – PPO | Source: Ambulatory Visit | Attending: Hematology | Admitting: Hematology

## 2022-02-11 ENCOUNTER — Inpatient Hospital Stay: Payer: BC Managed Care – PPO | Attending: Hematology

## 2022-02-11 ENCOUNTER — Inpatient Hospital Stay: Payer: BC Managed Care – PPO

## 2022-02-11 DIAGNOSIS — K5900 Constipation, unspecified: Secondary | ICD-10-CM | POA: Insufficient documentation

## 2022-02-11 DIAGNOSIS — Z170 Estrogen receptor positive status [ER+]: Secondary | ICD-10-CM | POA: Diagnosis not present

## 2022-02-11 DIAGNOSIS — Z79899 Other long term (current) drug therapy: Secondary | ICD-10-CM | POA: Insufficient documentation

## 2022-02-11 DIAGNOSIS — C50212 Malignant neoplasm of upper-inner quadrant of left female breast: Secondary | ICD-10-CM | POA: Diagnosis present

## 2022-02-11 DIAGNOSIS — C50919 Malignant neoplasm of unspecified site of unspecified female breast: Secondary | ICD-10-CM

## 2022-02-11 DIAGNOSIS — C50911 Malignant neoplasm of unspecified site of right female breast: Secondary | ICD-10-CM

## 2022-02-11 DIAGNOSIS — Z5189 Encounter for other specified aftercare: Secondary | ICD-10-CM | POA: Diagnosis not present

## 2022-02-11 DIAGNOSIS — Z8572 Personal history of non-Hodgkin lymphomas: Secondary | ICD-10-CM | POA: Diagnosis not present

## 2022-02-11 DIAGNOSIS — Z5111 Encounter for antineoplastic chemotherapy: Secondary | ICD-10-CM | POA: Insufficient documentation

## 2022-02-11 LAB — CMP (CANCER CENTER ONLY)
ALT: 15 U/L (ref 0–44)
AST: 18 U/L (ref 15–41)
Albumin: 4.7 g/dL (ref 3.5–5.0)
Alkaline Phosphatase: 60 U/L (ref 38–126)
Anion gap: 8 (ref 5–15)
BUN: 14 mg/dL (ref 6–20)
CO2: 30 mmol/L (ref 22–32)
Calcium: 10.1 mg/dL (ref 8.9–10.3)
Chloride: 100 mmol/L (ref 98–111)
Creatinine: 0.82 mg/dL (ref 0.44–1.00)
GFR, Estimated: >60 mL/min (ref >60)
Glucose, Bld: 95 mg/dL (ref 70–99)
Potassium: 3.7 mmol/L (ref 3.5–5.1)
Sodium: 138 mmol/L (ref 135–145)
Total Bilirubin: 0.3 mg/dL (ref 0.3–1.2)
Total Protein: 8 g/dL (ref 6.5–8.1)

## 2022-02-11 LAB — CBC WITH DIFFERENTIAL (CANCER CENTER ONLY)
Abs Immature Granulocytes: 0.03 10*3/uL (ref 0.00–0.07)
Basophils Absolute: 0.1 10*3/uL (ref 0.0–0.1)
Basophils Relative: 1 %
Eosinophils Absolute: 0.1 10*3/uL (ref 0.0–0.5)
Eosinophils Relative: 1 %
HCT: 39.2 % (ref 36.0–46.0)
Hemoglobin: 12.4 g/dL (ref 12.0–15.0)
Immature Granulocytes: 0 %
Lymphocytes Relative: 17 %
Lymphs Abs: 1.6 10*3/uL (ref 0.7–4.0)
MCH: 30.2 pg (ref 26.0–34.0)
MCHC: 31.6 g/dL (ref 30.0–36.0)
MCV: 95.6 fL (ref 80.0–100.0)
Monocytes Absolute: 1.3 10*3/uL — ABNORMAL HIGH (ref 0.1–1.0)
Monocytes Relative: 13 %
Neutro Abs: 6.5 10*3/uL (ref 1.7–7.7)
Neutrophils Relative %: 68 %
Platelet Count: 406 10*3/uL — ABNORMAL HIGH (ref 150–400)
RBC: 4.1 MIL/uL (ref 3.87–5.11)
RDW: 12.4 % (ref 11.5–15.5)
WBC Count: 9.6 10*3/uL (ref 4.0–10.5)
nRBC: 0 % (ref 0.0–0.2)

## 2022-02-12 ENCOUNTER — Inpatient Hospital Stay: Payer: BC Managed Care – PPO

## 2022-02-12 ENCOUNTER — Encounter: Payer: Self-pay | Admitting: Hematology

## 2022-02-12 ENCOUNTER — Encounter: Payer: Self-pay | Admitting: *Deleted

## 2022-02-12 ENCOUNTER — Inpatient Hospital Stay: Payer: BC Managed Care – PPO | Admitting: Hematology

## 2022-02-12 ENCOUNTER — Ambulatory Visit: Payer: BC Managed Care – PPO | Attending: Surgery

## 2022-02-12 VITALS — BP 140/68 | HR 70 | Temp 98.2°F | Resp 19 | Ht 63.0 in | Wt 172.8 lb

## 2022-02-12 VITALS — BP 152/82 | HR 91 | Temp 98.1°F | Resp 16

## 2022-02-12 DIAGNOSIS — C50911 Malignant neoplasm of unspecified site of right female breast: Secondary | ICD-10-CM

## 2022-02-12 DIAGNOSIS — C50212 Malignant neoplasm of upper-inner quadrant of left female breast: Secondary | ICD-10-CM | POA: Diagnosis present

## 2022-02-12 DIAGNOSIS — Z483 Aftercare following surgery for neoplasm: Secondary | ICD-10-CM | POA: Insufficient documentation

## 2022-02-12 DIAGNOSIS — Z170 Estrogen receptor positive status [ER+]: Secondary | ICD-10-CM | POA: Diagnosis present

## 2022-02-12 DIAGNOSIS — R293 Abnormal posture: Secondary | ICD-10-CM | POA: Insufficient documentation

## 2022-02-12 DIAGNOSIS — Z5111 Encounter for antineoplastic chemotherapy: Secondary | ICD-10-CM | POA: Diagnosis not present

## 2022-02-12 MED ORDER — SODIUM CHLORIDE 0.9 % IV SOLN: Freq: Once | INTRAVENOUS | Status: AC

## 2022-02-12 MED ORDER — SODIUM CHLORIDE 0.9 % IV SOLN
75.0000 mg/m2 | Freq: Once | INTRAVENOUS | Status: AC
  Administered 2022-02-12: 140 mg via INTRAVENOUS
  Filled 2022-02-12: qty 14

## 2022-02-12 MED ORDER — SODIUM CHLORIDE 0.9 % IV SOLN
600.0000 mg/m2 | Freq: Once | INTRAVENOUS | Status: AC
  Administered 2022-02-12: 1140 mg via INTRAVENOUS
  Filled 2022-02-12: qty 57

## 2022-02-12 MED ORDER — SODIUM CHLORIDE 0.9 % IV SOLN
10.0000 mg | Freq: Once | INTRAVENOUS | Status: AC
  Administered 2022-02-12: 10 mg via INTRAVENOUS
  Filled 2022-02-12: qty 10

## 2022-02-12 MED ORDER — PALONOSETRON HCL INJECTION 0.25 MG/5ML
0.2500 mg | Freq: Once | INTRAVENOUS | Status: AC
  Administered 2022-02-12: 0.25 mg via INTRAVENOUS
  Filled 2022-02-12: qty 5

## 2022-02-12 NOTE — Progress Notes (Addendum)
?[place]   ?[phone] [phone]   ?Clinic Follow up Note  ? ?Patient Care Team: ?Bonnita Nasuti, MD as PCP - General (Internal Medicine) ?Erroll Luna, MD as Consulting Physician (General Surgery) ?Truitt Merle, MD as Consulting Physician (Hematology) ?Gery Pray, MD as Consulting Physician (Radiation Oncology) ?Mauro Kaufmann, RN as Oncology Nurse Navigator ?Rockwell Germany, RN as Oncology Nurse Navigator ? ?Date of Service:  02/12/2022 ? ?CHIEF COMPLAINT: f/u of bilateral breast cancer ? ?CURRENT THERAPY:  ?Adjuvant TC, q21d, starting 02/12/22 ? ?ASSESSMENT & PLAN:  ?Erika Hodge is a 56 y.o. post-menopausal female with  ? ?1. 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 ?-she opted to proceed with b/l mastectomies with reconstruction on 01/07/22 with Dr. Brantley Stage and Dr. Iran Planas. Pathology revealed b/l breast cancers. Left breast showed 2.1 cm invasive and in situ ductal carcinoma. Margins and lymph nodes negative (0/2). I reviewed the pathology results with them today. Repeated ER/PR/HER2 were all negative (ER 10% weak (+) on biopsy) ?-she developed left IMF necrotic tissue, excised by Dr. Iran Planas on 01/27/22. ?-because she was likely treated with adriamycin for her lymphoma, she is not a candidate for more of this medicine. Therefore, I recommend 6 cycles of TC (docetaxel and cytoxan). She declined port placement.  ?-she is scheduled to begin TC today, 02/12/22. Labs from yesterday reviewed, adequate to proceed.  I again reviewed the potential side effect from chemo and management, she voiced good understanding. ?  ?2. Invasive lobular carcinoma of right breast, stage I pT1c, ER weakly+/PR+/Her2-, and DCIS  ?-incidental finding on b/l mastectomies on 01/07/22 for left breast cancer. Pathology from right breast showed: 1.1  cm invasive lobular carcinoma, grade 1, in LIQ; 1 cm DCIS, intermediate grade, in UIQ. ILC showed ER 30% weakly positive, PR 80% moderately positive, Her2 negative. ILC focally involves adjacent anterior margin.  ?-we discussed antiestrogen therapy today. I reviewed that given the positive ER and PR, as well as the lobular nature of the cancer on the right, the recommendation is for antiestrogen therapy to reduce the risk of outside recurrence. We will discuss further at a later time. ?  ?3. H/o Non-Hodgkin's Lymphoma ?-in 1980's (pt was in 8th grade), received radiation to the mid chest. ?-per pt, she also received three years of systemic treatment. She reports she was treated at [facility]. Her records were too old to be received, but she likely received adriamycin during that time. ?  ?4. Psoriasis  ?-previously on Tremfya injections, was told by dermatology that she could not restart due to h/o cancer. ?-I do not recommend immunotherapy because of this. I reviewed with them again today. ?-followed by Dr. Abner Greenspan with [facility], per pt ?  ?  ?PLAN:  ?-proceed with cycle 1 TC today with GCSF in on day 3  ?-lab and f/u in 1 week for toxicity check ?-lab, f/u, and TC every 3 weeks ? ? ?No problem-specific Assessment & Plan notes found for this encounter. ? ? ?SUMMARY OF ONCOLOGIC HISTORY: ?Oncology History Overview Note  ? Cancer Staging  ?Invasive lobular carcinoma of right breast, stage 1 (HCC) ?Staging form: Breast, AJCC 8th Edition ?- Clinical stage from 01/07/2022: Stage Unknown (cT1c, cNX, cM0, G1, ER+, PR+, HER2-) - Signed by Truitt Merle, MD on 01/24/2022 ? ?Malignant neoplasm of upper-inner quadrant of left breast in  female, estrogen receptor positive ([place]) ?Staging form: Breast, AJCC 8th Edition ?- Clinical stage from 11/20/2021: Stage IA (cT1c, cN0, cM0, G2, ER+, PR-, HER2-) - Signed by Truitt Merle, MD on 11/26/2021 ?- Pathologic stage from 01/07/2022: Stage IIA (pT2, pN0, cM0, G2, ER-, PR-, HER2-) - Signed  by Truitt Merle, MD on 01/24/2022 ? ? ?  ?Malignant neoplasm of upper-inner quadrant of left breast in female, estrogen receptor positive ([place])  ?11/05/2021 Mammogram  ? CLINICAL DATA:  Screening recall for a possible left breast ?asymmetry. ?  ?EXAM: ?DIGITAL DIAGNOSTIC UNILATERAL LEFT MAMMOGRAM WITH TOMOSYNTHESIS AND CAD; ULTRASOUND LEFT BREAST LIMITED ? ?IMPRESSION: ?1. Irregular 1.2 cm mass in the left breast at 11 o'clock, 2 cm the ?nipple, suspicious for malignancy. ?  ?11/20/2021 Cancer Staging  ? Staging form: Breast, AJCC 8th Edition ?- Clinical stage from 11/20/2021: Stage IA (cT1c, cN0, cM0, G2, ER+, PR-, HER2-) - Signed by Truitt Merle, MD on 11/26/2021 ?Stage prefix: Initial diagnosis ?Histologic grading system: 3 grade system ? ?  ?11/20/2021 Initial Biopsy  ? Diagnosis ?Breast, left, needle core biopsy, 11 o'clock, ribbon clip ?- INVASIVE DUCTAL CARCINOMA ?- DUCTAL CARCINOMA IN SITU ?- SEE COMMENT ?Microscopic Comment ?based on the biopsy, the carcinoma appears Nottingham grade 2 of 3 and measures 0.6 cm in greatest linear extent. ? ?PROGNOSTIC INDICATORS ?Results: ?The tumor cells are NEGATIVE for Her2 (1+). ?Estrogen Receptor: 10%, POSITIVE, WEAK STAINING INTENSITY ?Progesterone Receptor: 0%, NEGATIVE ?Proliferation Marker Ki67: 20% ?  ?11/26/2021 Initial Diagnosis  ? Malignant neoplasm of upper-inner quadrant of left breast in female, estrogen receptor positive ([place]) ?  ?11/27/2021 Genetic Testing  ? [employer] Cancer[employer] was Negative. Report date is 12/11/2021. ? ?The CancerNext gene panel offered by [employer] includes sequencing, rearrangement analysis, and RNA analysis for the following 36 genes:   APC, ATM, AXIN2, BARD1, BMPR1A, BRCA1, BRCA2, BRIP1, CDH1, CDK4, CDKN2A, CHEK2, DICER1, HOXB13, EPCAM, GREM1, MLH1, MSH2, MSH3, MSH6, MUTYH, NBN, NF1, NTHL1, PALB2, PMS2, POLD1, POLE, PTEN, RAD51C, RAD51D, RECQL, SMAD4, SMARCA4, STK11, and TP53.  ?  ?01/07/2022 Cancer Staging  ? Staging form: Breast, AJCC 8th  Edition ?- Pathologic stage from 01/07/2022: Stage IIA (pT2, pN0, cM0, G2, ER-, PR-, HER2-) - Signed by Truitt Merle, MD on 01/24/2022 ?Stage prefix: Initial diagnosis ?Histologic grading system: 3 grade system ?Residual tumor (R): R0 - None ? ?  ?01/07/2022 Definitive Surgery  ? FINAL MICROSCOPIC DIAGNOSIS:  ? ?A. BREAST, RIGHT NIPPLE, BIOPSY:  ?- Benign breast tissue.  ?- No malignancy identified.  ? ?B. BREAST, LEFT NIPPLE, BIOPSY:  ?- Benign breast tissue.  ?- No malignancy identified.  ? ?C. BREAST, RIGHT, MASTECTOMY:  ?- Invasive lobular carcinoma, 1.1 cm.  ?- Ductal carcinoma in situ, 1 cm.  ?- Invasive carcinoma focally extends to the anterior margin.  ?- Fibrocystic changes with usual ductal hyperplasia and calcifications.  ?- Sclerosing adenosis and fibroadenomatoid change.  ?- See oncology table and comment.  ? ?D. LYMPH NODE, LEFT AXILLARY, SENTINEL, EXCISION:  ?- One lymph node negative for metastatic carcinoma (0/1).  ? ?E. LYMPH NODE, LEFT AXILLARY, SENTINEL, EXCISION:  ?- One lymph node negative for metastatic carcinoma (0/1).  ? ?F. BREAST, LEFT, MASTECTOMY:  ?- Invasive and in situ ductal carcinoma, 2.1 cm.  ?- Margins negative for carcinoma.  ?- See oncology table.  ?  ?01/07/2022 Receptors her2  ? F1. PROGNOSTIC INDICATOR RESULTS:  ? ?The tumor cells are NEGATIVE for Her2 (0).  ? ?Estrogen Receptor: NEGATIVE  ?Progesterone Receptor: NEGATIVE  ?Proliferation Marker Ki-67:  10%  ?  ?02/12/2022 -  Chemotherapy  ? Patient is on Treatment Plan : BREAST TC q21d  ?   ?Invasive lobular carcinoma of right breast, stage 1 ([place])  ?11/27/2021 Genetic Testing  ? [employer] was Negative. Report date is 12/11/2021. ? ?The CancerNext gene panel offered by [employer] includes sequencing, rearrangement analysis, and RNA analysis for the following 36 genes:   APC, ATM, AXIN2, BARD1, BMPR1A, BRCA1, BRCA2, BRIP1, CDH1, CDK4, CDKN2A, CHEK2, DICER1, HOXB13, EPCAM, GREM1, MLH1, MSH2, MSH3, MSH6, MUTYH, NBN, NF1, NTHL1,  PALB2, PMS2, POLD1, POLE, PTEN, RAD51C, RAD51D, RECQL, SMAD4, SMARCA4, STK11, and TP53.  ?  ?01/07/2022 Cancer Staging  ? Staging form: Breast, AJCC 8th Edition ?- Clinical stage from 01/07/2022: Stage Unknown (

## 2022-02-12 NOTE — Patient Instructions (Signed)
[facility]  ? Discharge Instructions: ?Thank you for choosing [facility] to provide your oncology and hematology care.  ? ?If you have a lab appointment with the [facility], please go directly to the [facility] and check in at the registration area. ?  ?Wear comfortable clothing and clothing appropriate for easy access to any Portacath or PICC line.  ? ?We strive to give you quality time with your provider. You may need to reschedule your appointment if you arrive late (15 or more minutes).  Arriving late affects you and other patients whose appointments are after yours.  Also, if you miss three or more appointments without notifying the office, you may be dismissed from the clinic at the provider?s discretion.    ?  ?For prescription refill requests, have your pharmacy contact our office and allow 72 hours for refills to be completed.   ? ?Today you received the following chemotherapy and/or immunotherapy agents: docetaxel and cyclophosphamide.    ?  ?To help prevent nausea and vomiting after your treatment, we encourage you to take your nausea medication as directed. ? ?BELOW ARE SYMPTOMS THAT SHOULD BE REPORTED IMMEDIATELY: ?*FEVER GREATER THAN 100.4 F (38 ?C) OR HIGHER ?*CHILLS OR SWEATING ?*NAUSEA AND VOMITING THAT IS NOT CONTROLLED WITH YOUR NAUSEA MEDICATION ?*UNUSUAL SHORTNESS OF BREATH ?*UNUSUAL BRUISING OR BLEEDING ?*URINARY PROBLEMS (pain or burning when urinating, or frequent urination) ?*BOWEL PROBLEMS (unusual diarrhea, constipation, pain near the anus) ?TENDERNESS IN MOUTH AND THROAT WITH OR WITHOUT PRESENCE OF ULCERS (sore throat, sores in mouth, or a toothache) ?UNUSUAL RASH, SWELLING OR PAIN  ?UNUSUAL VAGINAL DISCHARGE OR ITCHING  ? ?Items with * indicate a potential emergency and should be followed up as soon as possible or go to the Emergency Department if any problems should occur. ? ?Please show the CHEMOTHERAPY ALERT CARD or IMMUNOTHERAPY  ALERT CARD at check-in to the Emergency Department and triage nurse. ? ?Should you have questions after your visit or need to cancel or reschedule your appointment, please contact [facility]  Dept: [phone]  and follow the prompts.  Office hours are 8:00 a.m. to 4:30 p.m. Monday - Friday. Please note that voicemails left after 4:00 p.m. may not be returned until the following business day.  We are closed weekends and major holidays. You have access to a nurse at all times for urgent questions. Please call the main number to the clinic Dept: [phone] and follow the prompts. ? ? ?For any non-urgent questions, you may also contact your provider using MyChart. We now offer e-Visits for anyone 53 and older to request care online for non-urgent symptoms. For details visit [email or web address]. ?  ?Also download the MyChart app! Go to the app store, search "MyChart", open the app, select [facility], and log in with your MyChart username and password. ? ?Due to Covid, a mask is required upon entering the hospital/clinic. If you do not have a mask, one will be given to you upon arrival. For doctor visits, patients may have 1 support person aged 71 or older with them. For treatment visits, patients cannot have anyone with them due to current Covid guidelines and our immunocompromised population.  ? ?Docetaxel injection ?What is this medication? ?DOCETAXEL (doe se TAX el) is a chemotherapy drug. It targets fast dividing cells, like cancer cells, and causes these cells to die. This medicine is used to treat many types of cancers like breast cancer, certain stomach cancers, head  and neck cancer, lung cancer, and prostate cancer. ?This medicine may be used for other purposes; ask your health care provider or pharmacist if you have questions. ?COMMON BRAND NAME(S): Docefrez, Taxotere ?What should I tell my care team before I take this medication? ?They need to know if you have any of  these conditions: ?infection (especially a virus infection such as chickenpox, cold sores, or herpes) ?liver disease ?low blood counts, like low white cell, platelet, or red cell counts ?an unusual or allergic reaction to docetaxel, polysorbate 80, other chemotherapy agents, other medicines, foods, dyes, or preservatives ?pregnant or trying to get pregnant ?breast-feeding ?How should I use this medication? ?This drug is given as an infusion into a vein. It is administered in a hospital or clinic by a specially trained health care professional. ?Talk to your pediatrician regarding the use of this medicine in children. Special care may be needed. ?Overdosage: If you think you have taken too much of this medicine contact a poison control center or emergency room at once. ?NOTE: This medicine is only for you. Do not share this medicine with others. ?What if I miss a dose? ?It is important not to miss your dose. Call your doctor or health care professional if you are unable to keep an appointment. ?What may interact with this medication? ?Do not take this medicine with any of the following medications: ?live virus vaccines ?This medicine may also interact with the following medications: ?aprepitant ?certain antibiotics like erythromycin or clarithromycin ?certain antivirals for HIV or hepatitis ?certain medicines for fungal infections like fluconazole, itraconazole, ketoconazole, posaconazole, or voriconazole ?cimetidine ?ciprofloxacin ?conivaptan ?cyclosporine ?dronedarone ?fluvoxamine ?grapefruit juice ?imatinib ?verapamil ?This list may not describe all possible interactions. Give your health care provider a list of all the medicines, herbs, non-prescription drugs, or dietary supplements you use. Also tell them if you smoke, drink alcohol, or use illegal drugs. Some items may interact with your medicine. ?What should I watch for while using this medication? ?Your condition will be monitored carefully while you are  receiving this medicine. You will need important blood work done while you are taking this medicine. ?Call your doctor or health care professional for advice if you get a fever, chills or sore throat, or other symptoms of a cold or flu. Do not treat yourself. This drug decreases your body's ability to fight infections. Try to avoid being around people who are sick. ?Some products may contain alcohol. Ask your health care professional if this medicine contains alcohol. Be sure to tell all health care professionals you are taking this medicine. Certain medicines, like metronidazole and disulfiram, can cause an unpleasant reaction when taken with alcohol. The reaction includes flushing, headache, nausea, vomiting, sweating, and increased thirst. The reaction can last from 30 minutes to several hours. ?You may get drowsy or dizzy. Do not drive, use machinery, or do anything that needs mental alertness until you know how this medicine affects you. Do not stand or sit up quickly, especially if you are an older patient. This reduces the risk of dizzy or fainting spells. Alcohol may interfere with the effect of this medicine. ?Talk to your health care professional about your risk of cancer. You may be more at risk for certain types of cancer if you take this medicine. ?Do not become pregnant while taking this medicine or for 6 months after stopping it. Women should inform their doctor if they wish to become pregnant or think they might be pregnant. There is a potential for serious  side effects to an unborn child. Talk to your health care professional or pharmacist for more information. Do not breast-feed an infant while taking this medicine or for 1 week after stopping it. ?Males who get this medicine must use a condom during sex with females who can get pregnant. If you get a woman pregnant, the baby could have birth defects. The baby could die before they are born. You will need to continue wearing a condom for 3 months  after stopping the medicine. Tell your health care provider right away if your partner becomes pregnant while you are taking this medicine. ?This may interfere with the ability to father a child. You should tal

## 2022-02-12 NOTE — Therapy (Signed)
?OUTPATIENT PHYSICAL THERAPY TREATMENT NOTE ? ? ?Patient Name: Erika Hodge ?MRN: [id number] ?DOB:10-01-1966, 56 y.o., female ?Today's Date: 02/12/2022 ? ?PCP: Bonnita Nasuti, MD ?REFERRING PROVIDER: Erroll Luna, MD ? ?END OF SESSION:  ? PT End of Session - 02/12/22 1105   ? ? Visit Number 2   ? Number of Visits 10   ? Date for PT Re-Evaluation 03/03/22   ? PT Start Time 1105   ? PT Stop Time 1914   ? PT Time Calculation (min) 47 min   ? Activity Tolerance Patient tolerated treatment well   ? Behavior During Therapy [facility] for tasks assessed/performed   ? ?  ?  ? ?  ? ? ?Past Medical History:  ?Diagnosis Date  ? Anxiety   ? Complication of anesthesia   ? HA  ? Non Hodgkin's lymphoma ([place])   ? Psoriasis   ? ?Past Surgical History:  ?Procedure Laterality Date  ? BREAST BIOPSY Left 11/20/2021  ? BREAST RECONSTRUCTION WITH PLACEMENT OF TISSUE EXPANDER AND ALLODERM Bilateral 01/07/2022  ? Procedure: BILATERAL BREAST RECONSTRUCTION WITH PLACEMENT OF TISSUE EXPANDERS AND ALLODERM;  Surgeon: Irene Limbo, MD;  Location: [facility];  Service: Plastics;  Laterality: Bilateral;  ? NIPPLE SPARING MASTECTOMY Right 01/07/2022  ? Procedure: RIGHT NIPPLE SPARING MASTECTOMY;  Surgeon: Erroll Luna, MD;  Location: [facility];  Service: General;  Laterality: Right;  ? NIPPLE SPARING MASTECTOMY WITH SENTINEL LYMPH NODE BIOPSY Left 01/07/2022  ? Procedure: LEFT NIPPLE SPARING MASTECTOMY WITH SENTINEL LYMPH NODE BIOPSY;  Surgeon: Erroll Luna, MD;  Location: [facility];  Service: General;  Laterality: Left;  ? TUBAL LIGATION    ? ?Patient Active Problem List  ? Diagnosis Date Noted  ? Invasive lobular carcinoma of right breast, stage 1 ([place]) 01/24/2022  ? Genetic testing 12/11/2021  ? Family history of breast cancer 11/29/2021  ? Malignant neoplasm of upper-inner quadrant of left breast in female, estrogen receptor positive ([place]) 11/26/2021  ? ? ?REFERRING DIAG: s/p bilateral  Mastectomies with left SLNB ? ?THERAPY DIAG:  ?Malignant neoplasm of upper-inner quadrant of left breast in female, estrogen receptor positive ([place]) ? ?Abnormal posture ? ?Aftercare following surgery for neoplasm ? ?PERTINENT HISTORY: Patient was diagnosed on 09/10/2021 with 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 Two negative axillary nodes were removed on the left side. An incidental finding of right breast invasive lobular carcinoma was also found and is ER/PR positive and HER2 negative. It is weakly ER positive, PR negative and HER2 negative with a Ki67 of 20%. She has a left wrist plate from a previous surgery in 2006 and a history of non-Hodgkins Lymphoma in 1985. ? ?PRECAUTIONS: left lymphedema risk, hx of Non-Hodgkins Lymphoma, left wrist plate from prior surgery ? ?SUBJECTIVE: I have been doing the exercises, but the hands behind head one is hard. The incisions seem to be healing better. I start chemo today ? ?PAIN:  ?Are you having pain? No ? ? ?  ?02/03/2022 OBSERVATIONS: ?           Right breast with increased edema compared to left. Left inferior breast incision appears to be slightly macerated. Non-stick pad applied under each breast to reduce fluid leakage. Left axillary incision appears to be healing well.  ?  ?POSTURE:  ?Forward head, rounded shoulders ?  ?LYMPHEDEMA ASSESSMENT:  ?  ?A/PROM Right ?11/27/2021 Left ?11/27/2021 Right ?02/03/2022  Left  ?02/03/2022  ?Shoulder extension 38 48 39 32  ?Shoulder flexion 157 152 100 51  ?Shoulder abduction 165 163 60 58  ?Shoulder internal rotation 67 62 63 NT  ?Shoulder external rotation 77 77 84 NT  ?                        (Blank rows = not tested) ?  ?  ?  ?CERVICAL AROM: ?All within normal limits ?  ?UPPER EXTREMITY STRENGTH: Not tested ?  ?  ?LYMPHEDEMA ASSESSMENTS:  ?  ?LANDMARK RIGHT ?11/27/2021 LEFT ?11/27/2021 RIGHT ?02/03/2022  LEFT ?02/03/2022  ?10 cm proximal to olecranon process 32 31.9 31.8 31.5  ?Olecranon process 27 27 27.7 26.4  ?10 cm proximal to ulnar styloid process 23.7 23 23.3 22.7  ?Just proximal to ulnar styloid process 16.4 16.8 16.1 16.7  ?Across hand at thumb web space 19 18.7 19 18.8  ?At base of 2nd digit 6.8 7.5 6.7 7.5  ?(Blank rows = not tested) ?  ? 02/12/2022 Treatment today ?Pulleys x 1 min for flexion, scaption, abduction  with   VC's to relax UT's and to take to point of gentle stretchSupine clasped hands flexion and stargazer x 5 ?Gentle PROM bilateral shoulders with VC's to pt to  ?Relax including flexion, scaption, abduction, IR and ER.  Long arm distraction ? ?             ?Surgery type/Date: 01/07/2022 ?Number of lymph nodes removed: 2 on left ?Current/past treatment (chemo, radiation, hormone therapy): Radiation to chest for Non-hodgkins lymphoma in 1985 ?Other symptoms:  ?Heaviness/tightness Yes ?Pain Yes ?Pitting edema No ?Infections No ?Decreased scar mobility Yes ?Stemmer sign No ?  ?  ?PATIENT EDUCATION:  ?Education details: HEP, initiated supine clasped hands flexion and supine stargazer today ?Person educated: Patient and Spouse ?Education method: Explanation, Demonstration, and Handouts ?Education comprehension: verbalized understanding and returned demonstration ?  ?  ?HOME EXERCISE PROGRAM: ?           Reviewed previously given post op HEP. Performed supine clasped hands flexion and stargazer today with improved ease ?  ?ASSESSMENT: ?  ?CLINICAL IMPRESSION: ?Pt is very anxious but did well with VC's for pulleys and supine exercises. Right shoulder AAROM/PROM is nearly WNL.  She made good improvements with left shoulder ROM although is more limited.  She did very well when relaxed. She starts chemo today ? ? ? ? ? ? ?Pt will benefit from skilled therapeutic intervention to improve on the following deficits: Decreased knowledge of precautions, impaired UE functional use, pain, decreased ROM, postural  dysfunction.  ?  ?PT treatment/interventions: ADL/Self care home management, Therapeutic exercises, Therapeutic activity, Neuromuscular re-education, Balance training, Gait training, Patient/Family education, and Joint mobilization ?  ?  ?  ?  ?GOALS: ?Goals reviewed with patient? Yes ?  ?LONG TERM GOALS:  (STG=LTG) ?  ?GOALS Name Target Date Goal status  ?1 Pt will demonstrate she has regained full shoulder ROM and function post operatively compared to baselines.  ?Baseline: 03/03/2022 IN PROGRESS  ?2 Patient will increase bilateral shoulder flexion to be >/= 140 degrees for increased ease reaching overhead. 03/03/2022 NEW  ?3 Patient will increase bilateral shoulder abduction to be >/= 150 degrees for increased ability to obtain radiation positioning. 03/03/2022 NEW  ?4 Patient will improve her DASH score to be </= 10 for improved overall UE function. 03/03/2022 NEW  ?  ?  ?  ?PLAN: ?PT FREQUENCY/DURATION: 2x/week for 4 weeks ?  ?  PLAN FOR NEXT SESSION: Begin gentle PROM (pt will be limited by pain), pulleys, gentle AAROM exercises ?  ? ? ? ? ? ? ?Claris Pong, PT ?02/12/2022, 11:55 AM ? ?  ? ?

## 2022-02-13 ENCOUNTER — Ambulatory Visit: Payer: BC Managed Care – PPO | Admitting: Hematology

## 2022-02-13 ENCOUNTER — Other Ambulatory Visit: Payer: BC Managed Care – PPO

## 2022-02-13 ENCOUNTER — Ambulatory Visit: Payer: BC Managed Care – PPO

## 2022-02-14 ENCOUNTER — Other Ambulatory Visit: Payer: Self-pay

## 2022-02-14 ENCOUNTER — Inpatient Hospital Stay: Payer: BC Managed Care – PPO

## 2022-02-14 VITALS — BP 160/82 | HR 94 | Temp 97.8°F | Resp 18

## 2022-02-14 DIAGNOSIS — Z5111 Encounter for antineoplastic chemotherapy: Secondary | ICD-10-CM | POA: Diagnosis not present

## 2022-02-14 DIAGNOSIS — C50212 Malignant neoplasm of upper-inner quadrant of left female breast: Secondary | ICD-10-CM

## 2022-02-14 MED ORDER — PEGFILGRASTIM-CBQV 6 MG/0.6ML ~~LOC~~ SOSY
6.0000 mg | PREFILLED_SYRINGE | Freq: Once | SUBCUTANEOUS | Status: AC
  Administered 2022-02-14: 6 mg via SUBCUTANEOUS
  Filled 2022-02-14: qty 0.6

## 2022-02-17 ENCOUNTER — Telehealth: Payer: Self-pay

## 2022-02-17 NOTE — Telephone Encounter (Signed)
Pt called c/o constipation and bones aching.  Informed pt that the bone aching is related to the [facility] which is a GCSF to help boost her body's manufacturing of blood cells.  Instructed pt to take Tylenol and Claritin the day before and for 4 to 5 days post receiving the Udenyca injection.  Instructed pt to start taking Claritin on a daily basis.  Pt verbalized understanding of instructions.  Pt stated she's taking ExLax, Miralax, Enema, and prune juice and still has not had a bowel movement.  Instructed pt to start taking Senokot Tabs, Colace, and Miralax TID until a bowel movement is achieved.  In future appt. Instructed pt to start taking Senokot Tabs and Colace 1 day prior to start of tx and take all three should she experience constipation.  Pt verbalized understanding of instructions. ?

## 2022-02-20 ENCOUNTER — Inpatient Hospital Stay: Payer: BC Managed Care – PPO

## 2022-02-20 ENCOUNTER — Encounter: Payer: Self-pay | Admitting: Hematology

## 2022-02-20 ENCOUNTER — Other Ambulatory Visit: Payer: Self-pay

## 2022-02-20 ENCOUNTER — Inpatient Hospital Stay: Payer: BC Managed Care – PPO | Admitting: Hematology

## 2022-02-20 ENCOUNTER — Encounter: Payer: Self-pay | Admitting: *Deleted

## 2022-02-20 VITALS — BP 132/54 | HR 115 | Temp 98.0°F | Resp 19 | Ht 63.0 in | Wt 173.0 lb

## 2022-02-20 DIAGNOSIS — Z5111 Encounter for antineoplastic chemotherapy: Secondary | ICD-10-CM | POA: Diagnosis not present

## 2022-02-20 DIAGNOSIS — C50911 Malignant neoplasm of unspecified site of right female breast: Secondary | ICD-10-CM

## 2022-02-20 DIAGNOSIS — C50919 Malignant neoplasm of unspecified site of unspecified female breast: Secondary | ICD-10-CM

## 2022-02-20 LAB — CBC WITH DIFFERENTIAL (CANCER CENTER ONLY)
Abs Immature Granulocytes: 4.73 10*3/uL — ABNORMAL HIGH (ref 0.00–0.07)
Basophils Absolute: 0.1 10*3/uL (ref 0.0–0.1)
Basophils Relative: 0 %
Eosinophils Absolute: 0.3 10*3/uL (ref 0.0–0.5)
Eosinophils Relative: 1 %
HCT: 39.4 % (ref 36.0–46.0)
Hemoglobin: 12.7 g/dL (ref 12.0–15.0)
Immature Granulocytes: 12 %
Lymphocytes Relative: 6 %
Lymphs Abs: 2.3 10*3/uL (ref 0.7–4.0)
MCH: 30.2 pg (ref 26.0–34.0)
MCHC: 32.2 g/dL (ref 30.0–36.0)
MCV: 93.8 fL (ref 80.0–100.0)
Monocytes Absolute: 5.5 10*3/uL — ABNORMAL HIGH (ref 0.1–1.0)
Monocytes Relative: 14 %
Neutro Abs: 25.3 10*3/uL — ABNORMAL HIGH (ref 1.7–7.7)
Neutrophils Relative %: 67 %
Platelet Count: 260 10*3/uL (ref 150–400)
RBC: 4.2 MIL/uL (ref 3.87–5.11)
RDW: 12.7 % (ref 11.5–15.5)
Smear Review: NORMAL
WBC Count: 38.2 10*3/uL — ABNORMAL HIGH (ref 4.0–10.5)
nRBC: 0.4 % — ABNORMAL HIGH (ref 0.0–0.2)

## 2022-02-20 LAB — CMP (CANCER CENTER ONLY)
ALT: 26 U/L (ref 0–44)
AST: 33 U/L (ref 15–41)
Albumin: 4.1 g/dL (ref 3.5–5.0)
Alkaline Phosphatase: 129 U/L — ABNORMAL HIGH (ref 38–126)
Anion gap: 9 (ref 5–15)
BUN: 9 mg/dL (ref 6–20)
CO2: 25 mmol/L (ref 22–32)
Calcium: 9.7 mg/dL (ref 8.9–10.3)
Chloride: 103 mmol/L (ref 98–111)
Creatinine: 0.75 mg/dL (ref 0.44–1.00)
GFR, Estimated: >60 mL/min (ref >60)
Glucose, Bld: 147 mg/dL — ABNORMAL HIGH (ref 70–99)
Potassium: 4 mmol/L (ref 3.5–5.1)
Sodium: 137 mmol/L (ref 135–145)
Total Bilirubin: 0.2 mg/dL — ABNORMAL LOW (ref 0.3–1.2)
Total Protein: 7.3 g/dL (ref 6.5–8.1)

## 2022-02-20 NOTE — Progress Notes (Signed)
?[place]   ?[phone] [phone]   ?Clinic Follow up Note  ? ?Patient Care Team: ?Bonnita Nasuti, MD as PCP - General (Internal Medicine) ?Erroll Luna, MD as Consulting Physician (General Surgery) ?Truitt Merle, MD as Consulting Physician (Hematology) ?Gery Pray, MD as Consulting Physician (Radiation Oncology) ?Mauro Kaufmann, RN as Oncology Nurse Navigator ?Rockwell Germany, RN as Oncology Nurse Navigator ? ?Date of Service:  02/20/2022 ? ?CHIEF COMPLAINT: f/u of bilateral breast cancer ? ?CURRENT THERAPY:  ?Adjuvant TC, q21d, starting 02/12/22 ? ?ASSESSMENT & PLAN:  ?Erika Hodge is a 56 y.o. post-menopausal female with  ? ?1. 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 ?-she opted to proceed with b/l mastectomies with reconstruction on 01/07/22 with Dr. Brantley Stage and Dr. Iran Planas. Pathology revealed b/l breast cancers. Left breast showed 2.1 cm invasive and in situ ductal carcinoma. Margins and lymph nodes negative (0/2). Repeated ER/PR/HER2 were all negative (ER 10% weak (+) on biopsy) ?-she developed left IMF necrotic tissue, excised by Dr. Iran Planas on 01/27/22. ?-because she was likely treated with adriamycin for her lymphoma, she is not a candidate for more of this medicine. Therefore, I recommend 6 cycles of TC (docetaxel and cytoxan). She declined port placement.  ?-she began TC on 02/12/22. She tolerated well overall except for moderate constipation. She had severe pain from the udenyca injection, will hold it for next cycle  ?-labs reviewed, WBC responding to injection, alk phos slightly elevated. Overall no concern. We will hold udenyca next cycle. ? ?2. Symptom Management: Constipation, Bone pain  ?-secondary to supportive meds ?-she discussed management with my nurse Tammi Sou on 02/17/22 (see phone and MyChart  notes) ?-she reports tylenol and claritin did not relieve her pain. ?  ?3. Invasive lobular carcinoma of right breast, stage I pT1c, ER weakly+/PR+/Her2-, and DCIS  ?-incidental finding on b/l mastectomies on 01/07/22 for left breast cancer. Pathology from right breast showed: 1.1 cm invasive lobular carcinoma, grade 1, in LIQ; 1 cm DCIS, intermediate grade, in UIQ. ILC showed ER 30% weakly positive, PR 80% moderately positive, Her2 negative. ILC focally involves adjacent anterior margin.  ?-we will discuss antiestrogen therapy after she finishes chemo. ?  ?4. H/o Non-Hodgkin's Lymphoma ?-in 1980's (pt was in 8th grade), received radiation to the mid chest. ?-per pt, she also received three years of systemic treatment. She reports she was treated at [facility]. Her records were too old to be received, but she likely received adriamycin during that time. ?  ?5. Psoriasis  ?-previously on Tremfya injections, was told by dermatology that she could not restart due to h/o cancer. ?-I do not recommend immunotherapy because of this. I reviewed with them again today. ?-followed by Dr. Abner Greenspan with [facility], per pt ?  ?  ?PLAN:  ?-lab, f/u, and TC in 2 and 5 weeks as scheduled, plan to hold Udenyca for next cycle chemo ? ? ?No problem-specific Assessment & Plan notes found for this encounter. ? ? ?SUMMARY OF ONCOLOGIC HISTORY: ?Oncology History Overview Note  ? Cancer Staging  ?Invasive lobular carcinoma of right breast, stage 1 (HCC) ?Staging form: Breast, AJCC 8th Edition ?- Clinical stage from 01/07/2022: Stage Unknown (cT1c, cNX, cM0, G1, ER+, PR+, HER2-) - Signed by Truitt Merle, MD on 01/24/2022 ? ?Malignant neoplasm of upper-inner quadrant of left breast in female, estrogen receptor positive (  [place]) ?Staging form: Breast, AJCC 8th Edition ?- Clinical stage from 11/20/2021: Stage IA (cT1c, cN0, cM0, G2, ER+, PR-, HER2-) - Signed by Truitt Merle, MD on 11/26/2021 ?- Pathologic stage from 01/07/2022: Stage IIA (pT2, pN0, cM0,  G2, ER-, PR-, HER2-) - Signed by Truitt Merle, MD on 01/24/2022 ? ? ?  ?Malignant neoplasm of upper-inner quadrant of left breast in female, estrogen receptor positive ([place])  ?11/05/2021 Mammogram  ? CLINICAL DATA:  Screening recall for a possible left breast ?asymmetry. ?  ?EXAM: ?DIGITAL DIAGNOSTIC UNILATERAL LEFT MAMMOGRAM WITH TOMOSYNTHESIS AND CAD; ULTRASOUND LEFT BREAST LIMITED ? ?IMPRESSION: ?1. Irregular 1.2 cm mass in the left breast at 11 o'clock, 2 cm the ?nipple, suspicious for malignancy. ?  ?11/20/2021 Cancer Staging  ? Staging form: Breast, AJCC 8th Edition ?- Clinical stage from 11/20/2021: Stage IA (cT1c, cN0, cM0, G2, ER+, PR-, HER2-) - Signed by Truitt Merle, MD on 11/26/2021 ?Stage prefix: Initial diagnosis ?Histologic grading system: 3 grade system ? ?  ?11/20/2021 Initial Biopsy  ? Diagnosis ?Breast, left, needle core biopsy, 11 o'clock, ribbon clip ?- INVASIVE DUCTAL CARCINOMA ?- DUCTAL CARCINOMA IN SITU ?- SEE COMMENT ?Microscopic Comment ?based on the biopsy, the carcinoma appears Nottingham grade 2 of 3 and measures 0.6 cm in greatest linear extent. ? ?PROGNOSTIC INDICATORS ?Results: ?The tumor cells are NEGATIVE for Her2 (1+). ?Estrogen Receptor: 10%, POSITIVE, WEAK STAINING INTENSITY ?Progesterone Receptor: 0%, NEGATIVE ?Proliferation Marker Ki67: 20% ?  ?11/26/2021 Initial Diagnosis  ? Malignant neoplasm of upper-inner quadrant of left breast in female, estrogen receptor positive ([place]) ?  ?11/27/2021 Genetic Testing  ? [employer] [facility] was Negative. Report date is 12/11/2021. ? ?The CancerNext gene panel offered by [employer] includes sequencing, rearrangement analysis, and RNA analysis for the following 36 genes:   APC, ATM, AXIN2, BARD1, BMPR1A, BRCA1, BRCA2, BRIP1, CDH1, CDK4, CDKN2A, CHEK2, DICER1, HOXB13, EPCAM, GREM1, MLH1, MSH2, MSH3, MSH6, MUTYH, NBN, NF1, NTHL1, PALB2, PMS2, POLD1, POLE, PTEN, RAD51C, RAD51D, RECQL, SMAD4, SMARCA4, STK11, and TP53.  ?  ?01/07/2022 Cancer Staging  ? Staging  form: Breast, AJCC 8th Edition ?- Pathologic stage from 01/07/2022: Stage IIA (pT2, pN0, cM0, G2, ER-, PR-, HER2-) - Signed by Truitt Merle, MD on 01/24/2022 ?Stage prefix: Initial diagnosis ?Histologic grading system: 3 grade system ?Residual tumor (R): R0 - None ? ?  ?01/07/2022 Definitive Surgery  ? FINAL MICROSCOPIC DIAGNOSIS:  ? ?A. BREAST, RIGHT NIPPLE, BIOPSY:  ?- Benign breast tissue.  ?- No malignancy identified.  ? ?B. BREAST, LEFT NIPPLE, BIOPSY:  ?- Benign breast tissue.  ?- No malignancy identified.  ? ?C. BREAST, RIGHT, MASTECTOMY:  ?- Invasive lobular carcinoma, 1.1 cm.  ?- Ductal carcinoma in situ, 1 cm.  ?- Invasive carcinoma focally extends to the anterior margin.  ?- Fibrocystic changes with usual ductal hyperplasia and calcifications.  ?- Sclerosing adenosis and fibroadenomatoid change.  ?- See oncology table and comment.  ? ?D. LYMPH NODE, LEFT AXILLARY, SENTINEL, EXCISION:  ?- One lymph node negative for metastatic carcinoma (0/1).  ? ?E. LYMPH NODE, LEFT AXILLARY, SENTINEL, EXCISION:  ?- One lymph node negative for metastatic carcinoma (0/1).  ? ?F. BREAST, LEFT, MASTECTOMY:  ?- Invasive and in situ ductal carcinoma, 2.1 cm.  ?- Margins negative for carcinoma.  ?- See oncology table.  ?  ?01/07/2022 Receptors her2  ? F1. PROGNOSTIC INDICATOR RESULTS:  ? ?The tumor cells are NEGATIVE for Her2 (0).  ? ?Estrogen Receptor: NEGATIVE  ?Progesterone Receptor: NEGATIVE  ?Proliferation Marker Ki-67: 10%  ?  ?  02/12/2022 -  Chemotherapy  ? Patient is on Treatment Plan : BREAST TC q21d  ?   ?Invasive lobular carcinoma of right breast, stage 1 ([place])  ?11/27/2021 Genetic Testing  ? [employer] was Negative. Report date is 12/11/2021. ? ?The CancerNext gene panel offered by [employer] includes sequencing, rearrangement analysis, and RNA analysis for the following 36 genes:   APC, ATM, AXIN2, BARD1, BMPR1A, BRCA1, BRCA2, BRIP1, CDH1, CDK4, CDKN2A, CHEK2, DICER1, HOXB13, EPCAM, GREM1, MLH1, MSH2, MSH3, MSH6,  MUTYH, NBN, NF1, NTHL1, PALB2, PMS2, POLD1, POLE, PTEN, RAD51C, RAD51D, RECQL, SMAD4, SMARCA4, STK11, and TP53.  ?  ?01/07/2022 Cancer Staging  ? Staging form: Breast, AJCC 8th Edition ?- Clinical stage from 2

## 2022-02-21 ENCOUNTER — Telehealth: Payer: Self-pay | Admitting: *Deleted

## 2022-02-21 ENCOUNTER — Ambulatory Visit: Payer: BC Managed Care – PPO | Admitting: Physical Therapy

## 2022-02-21 ENCOUNTER — Encounter: Payer: Self-pay | Admitting: Physical Therapy

## 2022-02-21 DIAGNOSIS — Z170 Estrogen receptor positive status [ER+]: Secondary | ICD-10-CM

## 2022-02-21 DIAGNOSIS — Z483 Aftercare following surgery for neoplasm: Secondary | ICD-10-CM

## 2022-02-21 DIAGNOSIS — C50212 Malignant neoplasm of upper-inner quadrant of left female breast: Secondary | ICD-10-CM | POA: Diagnosis not present

## 2022-02-21 DIAGNOSIS — R293 Abnormal posture: Secondary | ICD-10-CM

## 2022-02-21 NOTE — Telephone Encounter (Signed)
Patient called to advise that she had tingling in fingers for a few days after her first treatment and that she has developed a blister on one finger, however tingling has subsided.  Suggested that she wear gloves to wash dishes or when doing other work where her hands would get wet, as she has not been doing so.  Advised to use a good emollient lotion for hands and feet.  States that feet feel frost bitten at times.  She will try warm compresses and lotion.  She will call back if she continues to have issues and verbalized understanding.  ?

## 2022-02-21 NOTE — Therapy (Signed)
?OUTPATIENT PHYSICAL THERAPY TREATMENT NOTE ? ? ?Patient Name: Erika Hodge ?MRN: [id number] ?DOB:1966-08-16, 56 y.o., female ?Today's Date: 02/21/2022 ? ?PCP: Bonnita Nasuti, MD ?REFERRING PROVIDER: Erroll Luna, MD ? ?END OF SESSION:  ? PT End of Session - 02/21/22 0854   ? ? Visit Number 3   ? Number of Visits 10   ? Date for PT Re-Evaluation 03/03/22   ? PT Start Time 0802   ? PT Stop Time [phone]   ? PT Time Calculation (min) 49 min   ? Activity Tolerance Patient tolerated treatment well   ? Behavior During Therapy [facility] for tasks assessed/performed   ? ?  ?  ? ?  ? ? ? ?Past Medical History:  ?Diagnosis Date  ? Anxiety   ? Complication of anesthesia   ? HA  ? Non Hodgkin's lymphoma ([place])   ? Psoriasis   ? ?Past Surgical History:  ?Procedure Laterality Date  ? BREAST BIOPSY Left 11/20/2021  ? BREAST RECONSTRUCTION WITH PLACEMENT OF TISSUE EXPANDER AND ALLODERM Bilateral 01/07/2022  ? Procedure: BILATERAL BREAST RECONSTRUCTION WITH PLACEMENT OF TISSUE EXPANDERS AND ALLODERM;  Surgeon: Irene Limbo, MD;  Location: [place];  Service: Plastics;  Laterality: Bilateral;  ? NIPPLE SPARING MASTECTOMY Right 01/07/2022  ? Procedure: RIGHT NIPPLE SPARING MASTECTOMY;  Surgeon: Erroll Luna, MD;  Location: [place];  Service: General;  Laterality: Right;  ? NIPPLE SPARING MASTECTOMY WITH SENTINEL LYMPH NODE BIOPSY Left 01/07/2022  ? Procedure: LEFT NIPPLE SPARING MASTECTOMY WITH SENTINEL LYMPH NODE BIOPSY;  Surgeon: Erroll Luna, MD;  Location: [place];  Service: General;  Laterality: Left;  ? TUBAL LIGATION    ? ?Patient Active Problem List  ? Diagnosis Date Noted  ? Invasive lobular carcinoma of right breast, stage 1 ([place]) 01/24/2022  ? Genetic testing 12/11/2021  ? Family history of breast cancer 11/29/2021  ? Malignant neoplasm of upper-inner quadrant of left breast in female, estrogen receptor positive ([place]) 11/26/2021  ? ? ?REFERRING DIAG: s/p bilateral  Mastectomies with left SLNB ? ?THERAPY DIAG:  ?Aftercare following surgery for neoplasm ? ?Abnormal posture ? ?Malignant neoplasm of upper-inner quadrant of left breast in female, estrogen receptor positive ([place]) ? ?PERTINENT HISTORY: Patient was diagnosed on 09/10/2021 with 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 Two negative axillary nodes were removed on the left side. An incidental finding of right breast invasive lobular carcinoma was also found and is ER/PR positive and HER2 negative. It is weakly ER positive, PR negative and HER2 negative with a Ki67 of 20%. She has a left wrist plate from a previous surgery in 2006 and a history of non-Hodgkins Lymphoma in 1985. ? ?PRECAUTIONS: left lymphedema risk, hx of Non-Hodgkins Lymphoma, left wrist plate from prior surgery ? ?SUBJECTIVE: They gave me a bone infusion Friday and I was in bed for four days. I have started doing my exercises again and I feel ok.  ? ?PAIN:  ?Are you having pain? No ? ? ?  ?02/03/2022 OBSERVATIONS: ?           Right breast with increased edema compared to left. Left inferior breast incision appears to be slightly macerated. Non-stick pad applied under each breast to reduce fluid leakage. Left axillary incision appears to be healing well.  ?  ?POSTURE:  ?Forward head, rounded shoulders ?  ?LYMPHEDEMA ASSESSMENT:  ?  ?A/PROM Right ?11/27/2021 Left ?  11/27/2021 Right ?02/03/2022 Left  ?02/03/2022 Right 02/21/22 Left 02/21/22  ?Shoulder extension 38 48 39 32 61 54  ?Shoulder flexion 157 152 100 51 148 115  ?Shoulder abduction 165 163 60 58 169 120  ?Shoulder internal rotation 67 62 63 NT 54 55  ?Shoulder external rotation 77 77 84 NT 90 78  ?                        (Blank rows = not tested) ?  ?  ?  ?CERVICAL AROM: ?All within normal limits ?  ?UPPER EXTREMITY STRENGTH: Not tested ?  ?  ?LYMPHEDEMA ASSESSMENTS:  ?  ?LANDMARK  RIGHT ?11/27/2021 LEFT ?11/27/2021 RIGHT ?02/03/2022 LEFT ?02/03/2022  ?10 cm proximal to olecranon process 32 31.9 31.8 31.5  ?Olecranon process 27 27 27.7 26.4  ?10 cm proximal to ulnar styloid process 23.7 23 23.3 22.7  ?Just proximal to ulnar styloid process 16.4 16.8 16.1 16.7  ?Across hand at thumb web space 19 18.7 19 18.8  ?At base of 2nd digit 6.8 7.5 6.7 7.5  ?(Blank rows = not tested) ?  ?Treatment: ? ? ? 02/12/2022  ?Pulleys x 1 min for flexion, scaption, abduction  with   VC's to relax UT's and to take to point of gentle stretchSupine clasped hands flexion and stargazer x 5 ?Gentle PROM bilateral shoulders with VC's to pt to  ?Relax including flexion, scaption, abduction, IR and ER.  Long arm distraction ? ?02/21/22  ?Pulleys x 2 min in direction of flexion and abduction with v/c to keep elbows staight ?Ball up wall x 10 reps in to flexion with end range stretch and pt returning therapist demo ?Ball up wall in to abduction x 10 reps on R and 5 on L with pt feeling increased tightness on L ?Manual therapy: PROM to L shoulder in to flexion and abduction with pt able to relax by end of session and achieve full PROM with prolonged holds ?Educated pt in supine dowel exercises in to flexion and abduction and pt return demonstrated 3 reps each ? ?             ?Surgery type/Date: 01/07/2022 ?Number of lymph nodes removed: 2 on left ?Current/past treatment (chemo, radiation, hormone therapy): Radiation to chest for Non-hodgkins lymphoma in 1985 ?Other symptoms:  ?Heaviness/tightness Yes ?Pain Yes ?Pitting edema No ?Infections No ?Decreased scar mobility Yes ?Stemmer sign No ?  ?  ?PATIENT EDUCATION:  ?Education details: need to decrease use of post op pillow to help decrease guarding and muscle tightness ?Person educated: Patient  ?Education method: Explanation,  ?Education comprehension: verbalized understanding  ?  ?  ?HOME EXERCISE PROGRAM: ?           Reviewed previously given post op HEP. Performed supine clasped  hands flexion and stargazer today with improved ease ?4/14- issued supine dowel flexion and abduction  ?  ?ASSESSMENT: ?  ?CLINICAL IMPRESSION: ?Pt demonstrates improved bilateral shoulder ROM. She is still very guarded on the L side. Educated pt to try and stop using her pillow to help her stop guarding as much and encourage use and ROM on the L side. She was able to relax today to allow for full PROM. Added new AAROM today and educated pt in supine dowel exercises and issued these as part of HEP.  ? ? ? ? ? ? ?Pt will benefit from skilled therapeutic intervention to improve on the following deficits: Decreased knowledge of precautions, impaired UE functional  use, pain, decreased ROM, postural dysfunction.  ?  ?PT treatment/interventions: ADL/Self care home management, Therapeutic exercises, Therapeutic activity, Neuromuscular re-education, Balance training, Gait training, Patient/Family education, and Joint mobilization ?  ?  ?  ?  ?GOALS: ?Goals reviewed with patient? Yes ?  ?LONG TERM GOALS:  (STG=LTG) ?  ?GOALS Name Target Date Goal status  ?1 Pt will demonstrate she has regained full shoulder ROM and function post operatively compared to baselines.  ?Baseline: 03/03/2022 IN PROGRESS  ?2 Patient will increase bilateral shoulder flexion to be >/= 140 degrees for increased ease reaching overhead. 03/03/2022 NEW  ?3 Patient will increase bilateral shoulder abduction to be >/= 150 degrees for increased ability to obtain radiation positioning. 03/03/2022 NEW  ?4 Patient will improve her DASH score to be </= 10 for improved overall UE function. 03/03/2022 NEW  ?  ?  ?  ?PLAN: ?PT FREQUENCY/DURATION: 2x/week for 4 weeks ?  ?PLAN FOR NEXT SESSION: Cont gentle PROM  pulleys, ball up wall, how were dowel exercises? ?  ? ? ? ? ? ? ?Allyson Sabal Ruch, PT ?02/21/2022, 9:00 AM ? ?  ? ?

## 2022-02-24 ENCOUNTER — Ambulatory Visit: Payer: BC Managed Care – PPO

## 2022-02-24 DIAGNOSIS — C50212 Malignant neoplasm of upper-inner quadrant of left female breast: Secondary | ICD-10-CM | POA: Diagnosis not present

## 2022-02-24 DIAGNOSIS — Z170 Estrogen receptor positive status [ER+]: Secondary | ICD-10-CM

## 2022-02-24 DIAGNOSIS — Z483 Aftercare following surgery for neoplasm: Secondary | ICD-10-CM

## 2022-02-24 DIAGNOSIS — R293 Abnormal posture: Secondary | ICD-10-CM

## 2022-02-24 NOTE — Therapy (Signed)
?OUTPATIENT PHYSICAL THERAPY TREATMENT NOTE ? ? ?Patient Name: Erika Hodge ?MRN: [id number] ?DOB:08/21/1966, 56 y.o., female ?Today's Date: 02/24/2022 ? ?PCP: Bonnita Nasuti, MD ?REFERRING PROVIDER: Erroll Luna, MD ? ?END OF SESSION:  ? PT End of Session - 02/24/22 1307   ? ? Visit Number 4   ? Number of Visits 10   ? Date for PT Re-Evaluation 03/03/22   ? PT Start Time 4098   ? PT Stop Time 1401   ? PT Time Calculation (min) 56 min   ? Activity Tolerance Patient tolerated treatment well   ? Behavior During Therapy [facility] for tasks assessed/performed   ? ?  ?  ? ?  ? ? ? ?Past Medical History:  ?Diagnosis Date  ? Anxiety   ? Complication of anesthesia   ? HA  ? Non Hodgkin's lymphoma ([place])   ? Psoriasis   ? ?Past Surgical History:  ?Procedure Laterality Date  ? BREAST BIOPSY Left 11/20/2021  ? BREAST RECONSTRUCTION WITH PLACEMENT OF TISSUE EXPANDER AND ALLODERM Bilateral 01/07/2022  ? Procedure: BILATERAL BREAST RECONSTRUCTION WITH PLACEMENT OF TISSUE EXPANDERS AND ALLODERM;  Surgeon: Irene Limbo, MD;  Location: [place];  Service: Plastics;  Laterality: Bilateral;  ? NIPPLE SPARING MASTECTOMY Right 01/07/2022  ? Procedure: RIGHT NIPPLE SPARING MASTECTOMY;  Surgeon: Erroll Luna, MD;  Location: [place];  Service: General;  Laterality: Right;  ? NIPPLE SPARING MASTECTOMY WITH SENTINEL LYMPH NODE BIOPSY Left 01/07/2022  ? Procedure: LEFT NIPPLE SPARING MASTECTOMY WITH SENTINEL LYMPH NODE BIOPSY;  Surgeon: Erroll Luna, MD;  Location: [place];  Service: General;  Laterality: Left;  ? TUBAL LIGATION    ? ?Patient Active Problem List  ? Diagnosis Date Noted  ? Invasive lobular carcinoma of right breast, stage 1 ([place]) 01/24/2022  ? Genetic testing 12/11/2021  ? Family history of breast cancer 11/29/2021  ? Malignant neoplasm of upper-inner quadrant of left breast in female, estrogen receptor positive ([place]) 11/26/2021  ? ? ?REFERRING DIAG: s/p bilateral  Mastectomies with left SLNB ? ?THERAPY DIAG:  ?Aftercare following surgery for neoplasm ? ?Abnormal posture ? ?Malignant neoplasm of upper-inner quadrant of left breast in female, estrogen receptor positive ([place]) ? ?PERTINENT HISTORY: Patient was diagnosed on 09/10/2021 with 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 Two negative axillary nodes were removed on the left side. An incidental finding of right breast invasive lobular carcinoma was also found and is ER/PR positive and HER2 negative. It is weakly ER positive, PR negative and HER2 negative with a Ki67 of 20%. She has a left wrist plate from a previous surgery in 2006 and a history of non-Hodgkins Lymphoma in 1985. ? ?PRECAUTIONS: left lymphedema risk, hx of Non-Hodgkins Lymphoma, left wrist plate from prior surgery ? ?SUBJECTIVE: I've been doing the dowel exercises some as I've felt up to it. I can tell I can move my Rt arm a lot better and my Lt arm some better.  ? ? ?PAIN:  ?Are you having pain? No ? ? ?  ?02/03/2022 OBSERVATIONS: ?           Right breast with increased edema compared to left. Left inferior breast incision appears to be slightly macerated. Non-stick pad applied under each breast to reduce fluid leakage. Left axillary incision appears to be healing well.  ?  ?POSTURE:  ?Forward head, rounded shoulders ?  ?LYMPHEDEMA ASSESSMENT:  ?  ?  A/PROM Right ?11/27/2021 Left ?11/27/2021 Right ?02/03/2022 Left  ?02/03/2022 Right 02/21/22 Left 02/21/22  ?Shoulder extension 38 48 39 32 61 54  ?Shoulder flexion 157 152 100 51 148 115  ?Shoulder abduction 165 163 60 58 169 120  ?Shoulder internal rotation 67 62 63 NT 54 55  ?Shoulder external rotation 77 77 84 NT 90 78  ?                        (Blank rows = not tested) ?  ?  ?  ?CERVICAL AROM: ?All within normal limits ?  ?UPPER EXTREMITY STRENGTH: Not tested ?  ?  ?LYMPHEDEMA  ASSESSMENTS:  ?  ?LANDMARK RIGHT ?11/27/2021 LEFT ?11/27/2021 RIGHT ?02/03/2022 LEFT ?02/03/2022  ?10 cm proximal to olecranon process 32 31.9 31.8 31.5  ?Olecranon process 27 27 27.7 26.4  ?10 cm proximal to ulnar styloid process 23.7 23 23.3 22.7  ?Just proximal to ulnar styloid process 16.4 16.8 16.1 16.7  ?Across hand at thumb web space 19 18.7 19 18.8  ?At base of 2nd digit 6.8 7.5 6.7 7.5  ?(Blank rows = not tested) ?  ?Treatment: ? ?02/24/22: ?Therapeutic Exs: ?Pulleys: Flex and Abd x2 mins each  ?Ball roll up wall into flexion x10 and then scaption x5 returning therapist demo and VCs not to push into pain ?Supine for shoulder presses into mat 5x, 5 sec holds ?Manual Therapy:  ?In Supine for Lt>Rt shoulder P/ROM into flexion, abd and D2 to pts tolerance with gentle distraction during and VCs to relax with Lt UE ? ? 02/12/2022  ?Pulleys x 1 min for flexion, scaption, abduction  with   VC's to relax UT's and to take to point of gentle stretchSupine clasped hands flexion and stargazer x 5 ?Gentle PROM bilateral shoulders with VC's to pt to  ?Relax including flexion, scaption, abduction, IR and ER.  Long arm distraction ? ?02/21/22  ?Pulleys x 2 min in direction of flexion and abduction with v/c to keep elbows staight ?Ball up wall x 10 reps in to flexion with end range stretch and pt returning therapist demo ?Ball up wall in to abduction x 10 reps on R and 5 on L with pt feeling increased tightness on L ?Manual therapy: PROM to L shoulder in to flexion and abduction with pt able to relax by end of session and achieve full PROM with prolonged holds ?Educated pt in supine dowel exercises in to flexion and abduction and pt return demonstrated 3 reps each ? ?             ?Surgery type/Date: 01/07/2022 ?Number of lymph nodes removed: 2 on left ?Current/past treatment (chemo, radiation, hormone therapy): Radiation to chest for Non-hodgkins lymphoma in 1985 ?Other symptoms:  ?Heaviness/tightness Yes ?Pain Yes ?Pitting edema  No ?Infections No ?Decreased scar mobility Yes ?Stemmer sign No ?  ?  ?PATIENT EDUCATION:  ?Education details: need to decrease use of post op pillow to help decrease guarding and muscle tightness ?Person educated: Patient  ?Education method: Explanation,  ?Education comprehension: verbalized understanding  ?  ?  ?HOME EXERCISE PROGRAM: ?           Reviewed previously given post op HEP. Performed supine clasped hands flexion and stargazer today with improved ease ?4/14- issued supine dowel flexion and abduction  ?  ?ASSESSMENT: ?  ?CLINICAL IMPRESSION: ?Pt reports Rt side has been feeling really good. Continued with AA/ROM stretches continuing to encourage pt to relax during exs as  she does struggle with this. Then continued with manual therapy working to decrease muscle tightness and increase Lt shoulder P/ROM. Pt reports feeling looser at end of session.  ? ? ? ?Pt will benefit from skilled therapeutic intervention to improve on the following deficits: Decreased knowledge of precautions, impaired UE functional use, pain, decreased ROM, postural dysfunction.  ?  ?PT treatment/interventions: ADL/Self care home management, Therapeutic exercises, Therapeutic activity, Neuromuscular re-education, Balance training, Gait training, Patient/Family education, and Joint mobilization ?  ?  ?  ?  ?GOALS: ?Goals reviewed with patient? Yes ?  ?LONG TERM GOALS:  (STG=LTG) ?  ?GOALS Name Target Date Goal status  ?1 Pt will demonstrate she has regained full shoulder ROM and function post operatively compared to baselines.  ?Baseline: 03/03/2022 IN PROGRESS  ?2 Patient will increase bilateral shoulder flexion to be >/= 140 degrees for increased ease reaching overhead. 03/03/2022 NEW  ?3 Patient will increase bilateral shoulder abduction to be >/= 150 degrees for increased ability to obtain radiation positioning. 03/03/2022 NEW  ?4 Patient will improve her DASH score to be </= 10 for improved overall UE function. 03/03/2022 NEW  ?  ?  ?   ?PLAN: ?PT FREQUENCY/DURATION: 2x/week for 4 weeks ?  ?PLAN FOR NEXT SESSION: Cont gentle PROM  pulleys, ball up wall ?  ? ? ? ? ? ? ?Otelia Limes, PTA ?02/24/2022, 2:02 PM ? ?  ? ?

## 2022-02-26 ENCOUNTER — Ambulatory Visit: Payer: BC Managed Care – PPO

## 2022-02-26 DIAGNOSIS — R293 Abnormal posture: Secondary | ICD-10-CM

## 2022-02-26 DIAGNOSIS — Z483 Aftercare following surgery for neoplasm: Secondary | ICD-10-CM

## 2022-02-26 DIAGNOSIS — Z170 Estrogen receptor positive status [ER+]: Secondary | ICD-10-CM

## 2022-02-26 DIAGNOSIS — C50212 Malignant neoplasm of upper-inner quadrant of left female breast: Secondary | ICD-10-CM | POA: Diagnosis not present

## 2022-02-26 NOTE — Therapy (Signed)
?OUTPATIENT PHYSICAL THERAPY TREATMENT NOTE ? ? ?Patient Name: Erika Hodge ?MRN: [id number] ?DOB:1966/03/23, 56 y.o., female ?Today's Date: 02/26/2022 ? ?PCP: Bonnita Nasuti, MD ?REFERRING PROVIDER: Erroll Luna, MD ? ?END OF SESSION:  ? PT End of Session - 02/26/22 1110   ? ? Visit Number 5   ? Number of Visits 10   ? Date for PT Re-Evaluation 03/03/22   ? PT Start Time 1108   ? PT Stop Time 5361   ? PT Time Calculation (min) 56 min   ? Activity Tolerance Patient tolerated treatment well   ? Behavior During Therapy [facility] for tasks assessed/performed   ? ?  ?  ? ?  ? ? ? ?Past Medical History:  ?Diagnosis Date  ? Anxiety   ? Complication of anesthesia   ? HA  ? Non Hodgkin's lymphoma ([place])   ? Psoriasis   ? ?Past Surgical History:  ?Procedure Laterality Date  ? BREAST BIOPSY Left 11/20/2021  ? BREAST RECONSTRUCTION WITH PLACEMENT OF TISSUE EXPANDER AND ALLODERM Bilateral 01/07/2022  ? Procedure: BILATERAL BREAST RECONSTRUCTION WITH PLACEMENT OF TISSUE EXPANDERS AND ALLODERM;  Surgeon: Irene Limbo, MD;  Location: [place];  Service: Plastics;  Laterality: Bilateral;  ? NIPPLE SPARING MASTECTOMY Right 01/07/2022  ? Procedure: RIGHT NIPPLE SPARING MASTECTOMY;  Surgeon: Erroll Luna, MD;  Location: [place];  Service: General;  Laterality: Right;  ? NIPPLE SPARING MASTECTOMY WITH SENTINEL LYMPH NODE BIOPSY Left 01/07/2022  ? Procedure: LEFT NIPPLE SPARING MASTECTOMY WITH SENTINEL LYMPH NODE BIOPSY;  Surgeon: Erroll Luna, MD;  Location: [place];  Service: General;  Laterality: Left;  ? TUBAL LIGATION    ? ?Patient Active Problem List  ? Diagnosis Date Noted  ? Invasive lobular carcinoma of right breast, stage 1 ([place]) 01/24/2022  ? Genetic testing 12/11/2021  ? Family history of breast cancer 11/29/2021  ? Malignant neoplasm of upper-inner quadrant of left breast in female, estrogen receptor positive ([place]) 11/26/2021  ? ? ?REFERRING DIAG: s/p bilateral  Mastectomies with left SLNB ? ?THERAPY DIAG:  ?Aftercare following surgery for neoplasm ? ?Abnormal posture ? ?Malignant neoplasm of upper-inner quadrant of left breast in female, estrogen receptor positive ([place]) ? ?PERTINENT HISTORY: Patient was diagnosed on 09/10/2021 with 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 Two negative axillary nodes were removed on the left side. An incidental finding of right breast invasive lobular carcinoma was also found and is ER/PR positive and HER2 negative. It is weakly ER positive, PR negative and HER2 negative with a Ki67 of 20%. She has a left wrist plate from a previous surgery in 2006 and a history of non-Hodgkins Lymphoma in 1985. ? ?PRECAUTIONS: left lymphedema risk, hx of Non-Hodgkins Lymphoma, left wrist plate from prior surgery ? ?SUBJECTIVE: I felt good after last session with just a little expected soreness.  ? ? ?PAIN:  ?Are you having pain? No ? ? ?  ?02/03/2022 OBSERVATIONS: ?           Right breast with increased edema compared to left. Left inferior breast incision appears to be slightly macerated. Non-stick pad applied under each breast to reduce fluid leakage. Left axillary incision appears to be healing well.  ?  ?POSTURE:  ?Forward head, rounded shoulders ?  ?LYMPHEDEMA ASSESSMENT:  ?  ?A/PROM Right ?11/27/2021 Left ?11/27/2021 Right ?02/03/2022 Left  ?02/03/2022 Right 02/21/22 Left 02/21/22  ?Shoulder extension  38 48 39 32 61 54  ?Shoulder flexion 157 152 100 51 148 115  ?Shoulder abduction 165 163 60 58 169 120  ?Shoulder internal rotation 67 62 63 NT 54 55  ?Shoulder external rotation 77 77 84 NT 90 78  ?                        (Blank rows = not tested) ?  ?  ?  ?CERVICAL AROM: ?All within normal limits ?  ?UPPER EXTREMITY STRENGTH: Not tested ?  ?  ?LYMPHEDEMA ASSESSMENTS:  ?  ?LANDMARK RIGHT ?11/27/2021 LEFT ?11/27/2021 RIGHT ?02/03/2022  LEFT ?02/03/2022  ?10 cm proximal to olecranon process 32 31.9 31.8 31.5  ?Olecranon process 27 27 27.7 26.4  ?10 cm proximal to ulnar styloid process 23.7 23 23.3 22.7  ?Just proximal to ulnar styloid process 16.4 16.8 16.1 16.7  ?Across hand at thumb web space 19 18.7 19 18.8  ?At base of 2nd digit 6.8 7.5 6.7 7.5  ?(Blank rows = not tested) ?  ?Treatment: ? ?02/26/22: ?Therapeutic Exs: ?Pulleys: Flex and Abd x2 mins each  ?Ball roll up wall into flexion x10 and then Rt UE abd x5 and Lt UE scaption x5 ?Supine for shoulder presses into mat 5x, 5 sec holds ?Manual Therapy:  ?P/ROM: In Supine for Lt>Rt shoulder into flexion, abd and D2 to pts tolerance with gentle distraction during and VCs to relax with Lt UE though pt was better able to relax today ?STM: In Rt S/L with cocoa butter to Lt periscapular and UT area where trigger points palpable, pt slightly less tender to touch today than at last session ? ? ?02/24/22: ?Therapeutic Exs: ?Pulleys: Flex and Abd x2 mins each  ?Ball roll up wall into flexion x10 and then scaption x5 returning therapist demo and VCs not to push into pain ?Supine for shoulder presses into mat 5x, 5 sec holds ?Manual Therapy:  ?In Supine for Lt>Rt shoulder P/ROM into flexion, abd and D2 to pts tolerance with gentle distraction during and VCs to relax with Lt UE ? ?02/21/22  ?Pulleys x 2 min in direction of flexion and abduction with v/c to keep elbows staight ?Ball up wall x 10 reps in to flexion with end range stretch and pt returning therapist demo ?Ball up wall in to abduction x 10 reps on R and 5 on L with pt feeling increased tightness on L ?Manual therapy: PROM to L shoulder in to flexion and abduction with pt able to relax by end of session and achieve full PROM with prolonged holds ?Educated pt in supine dowel exercises in to flexion and abduction and pt return demonstrated 3 reps each ? ?             ?Surgery type/Date: 01/07/2022 ?Number of lymph nodes removed: 2 on left ?Current/past  treatment (chemo, radiation, hormone therapy): Radiation to chest for Non-hodgkins lymphoma in 1985 ?Other symptoms:  ?Heaviness/tightness Yes ?Pain Yes ?Pitting edema No ?Infections No ?Decreased scar mobility Yes ?Stemmer sign No ?  ?  ?PATIENT EDUCATION:  ?Education details: need to decrease use of post op pillow to help decrease guarding and muscle tightness ?Person educated: Patient  ?Education method: Explanation,  ?Education comprehension: verbalized understanding  ?  ?  ?HOME EXERCISE PROGRAM: ?           Reviewed previously given post op HEP. Performed supine clasped hands flexion and stargazer today with improved ease ?4/14- issued supine dowel flexion and  abduction  ?  ?ASSESSMENT: ?  ?CLINICAL IMPRESSION: ?Pt continues to tolerate AA/ROM exercises well and was able to increase her motion today with activities. Reports feeling good stretch with all and no pain. Also continued with manual therapy working to progress end P/ROM of LT>Rt shoulders while decreasing limiting muscular tightness and guarding. By end of session her end P/ROM was much improved in bil shoulders and pt reports feeling less tight in general as well.   ? ? ? ?Pt will benefit from skilled therapeutic intervention to improve on the following deficits: Decreased knowledge of precautions, impaired UE functional use, pain, decreased ROM, postural dysfunction.  ?  ?PT treatment/interventions: ADL/Self care home management, Therapeutic exercises, Therapeutic activity, Neuromuscular re-education, Balance training, Gait training, Patient/Family education, and Joint mobilization ?  ?  ?  ?  ?GOALS: ?Goals reviewed with patient? Yes ?  ?LONG TERM GOALS:  (STG=LTG) ?  ?GOALS Name Target Date Goal status  ?1 Pt will demonstrate she has regained full shoulder ROM and function post operatively compared to baselines.  ?Baseline: 03/03/2022 IN PROGRESS  ?2 Patient will increase bilateral shoulder flexion to be >/= 140 degrees for increased ease reaching  overhead. 03/03/2022 NEW  ?3 Patient will increase bilateral shoulder abduction to be >/= 150 degrees for increased ability to obtain radiation positioning. 03/03/2022 NEW  ?4 Patient will improve her DASH s

## 2022-03-03 ENCOUNTER — Ambulatory Visit: Payer: BC Managed Care – PPO

## 2022-03-03 DIAGNOSIS — Z170 Estrogen receptor positive status [ER+]: Secondary | ICD-10-CM

## 2022-03-03 DIAGNOSIS — R293 Abnormal posture: Secondary | ICD-10-CM

## 2022-03-03 DIAGNOSIS — C50212 Malignant neoplasm of upper-inner quadrant of left female breast: Secondary | ICD-10-CM | POA: Diagnosis not present

## 2022-03-03 DIAGNOSIS — Z483 Aftercare following surgery for neoplasm: Secondary | ICD-10-CM

## 2022-03-03 NOTE — Therapy (Signed)
?OUTPATIENT PHYSICAL THERAPY TREATMENT NOTE ? ? ?Patient Name: Erika Hodge ?MRN: [id number] ?DOB:Jul 27, 1966, 56 y.o., female ?Today's Date: 03/03/2022 ? ?PCP: Bonnita Nasuti, MD ?REFERRING PROVIDER: Erroll Luna, MD ? ?END OF SESSION:  ? PT End of Session - 03/03/22 1405   ? ? Visit Number 6   ? Number of Visits 14   ? Date for PT Re-Evaluation 03/31/22   ? PT Start Time 1406   ? PT Stop Time 1455   ? PT Time Calculation (min) 49 min   ? Activity Tolerance Patient tolerated treatment well   ? Behavior During Therapy [facility] for tasks assessed/performed   ? ?  ?  ? ?  ? ? ? ?Past Medical History:  ?Diagnosis Date  ? Anxiety   ? Complication of anesthesia   ? HA  ? Non Hodgkin's lymphoma ([place])   ? Psoriasis   ? ?Past Surgical History:  ?Procedure Laterality Date  ? BREAST BIOPSY Left 11/20/2021  ? BREAST RECONSTRUCTION WITH PLACEMENT OF TISSUE EXPANDER AND ALLODERM Bilateral 01/07/2022  ? Procedure: BILATERAL BREAST RECONSTRUCTION WITH PLACEMENT OF TISSUE EXPANDERS AND ALLODERM;  Surgeon: Irene Limbo, MD;  Location: [place];  Service: Plastics;  Laterality: Bilateral;  ? NIPPLE SPARING MASTECTOMY Right 01/07/2022  ? Procedure: RIGHT NIPPLE SPARING MASTECTOMY;  Surgeon: Erroll Luna, MD;  Location: [place];  Service: General;  Laterality: Right;  ? NIPPLE SPARING MASTECTOMY WITH SENTINEL LYMPH NODE BIOPSY Left 01/07/2022  ? Procedure: LEFT NIPPLE SPARING MASTECTOMY WITH SENTINEL LYMPH NODE BIOPSY;  Surgeon: Erroll Luna, MD;  Location: [place];  Service: General;  Laterality: Left;  ? TUBAL LIGATION    ? ?Patient Active Problem List  ? Diagnosis Date Noted  ? Invasive lobular carcinoma of right breast, stage 1 ([place]) 01/24/2022  ? Genetic testing 12/11/2021  ? Family history of breast cancer 11/29/2021  ? Malignant neoplasm of upper-inner quadrant of left breast in female, estrogen receptor positive ([place]) 11/26/2021  ? ? ?REFERRING DIAG: s/p bilateral  Mastectomies with left SLNB ? ?THERAPY DIAG:  ?Aftercare following surgery for neoplasm ? ?Abnormal posture ? ?Malignant neoplasm of upper-inner quadrant of left breast in female, estrogen receptor positive ([place]) ? ?PERTINENT HISTORY: Patient was diagnosed on 09/10/2021 with 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 Two negative axillary nodes were removed on the left side. An incidental finding of right breast invasive lobular carcinoma was also found and is ER/PR positive and HER2 negative. It is weakly ER positive, PR negative and HER2 negative with a Ki67 of 20%. She has a left wrist plate from a previous surgery in 2006 and a history of non-Hodgkins Lymphoma in 1985. ? ?PRECAUTIONS: left lymphedema risk, hx of Non-Hodgkins Lymphoma, left wrist plate from prior surgery ? ?SUBJECTIVE: Did well after last visit, but had a rough day yesterday.  I had a lot of swelling and soreness under the left arm. ?I can put away dishes better now, fold laundry better, sleeping OK. I still have trouble opening jars, sometimes taking clothes on and off. Trying to do exercises 2x per day, but have not done yet today. ? ?PAIN:  ?Are you having pain? No ? ? ?  ?02/03/2022 OBSERVATIONS: ?           Right breast with increased edema compared to left. Left inferior breast incision appears to be slightly macerated. Non-stick pad applied under  each breast to reduce fluid leakage. Left axillary incision appears to be healing well.  ?  ?POSTURE:  ?Forward head, rounded shoulders ?  ?LYMPHEDEMA ASSESSMENT:  ?  ?A/PROM Right ?11/27/2021 Left ?11/27/2021 Right ?02/03/2022 Left  ?02/03/2022 Right 02/21/22 Left 02/21/22 Right ?03/03/2022 Left ?03/03/2022  ?Shoulder extension 38 48 39 32 61 54 51 58  ?Shoulder flexion 157 152 100 51 148 115 163 143  ?Shoulder abduction 165 163 60 58 169 120 179 133  ?Shoulder internal rotation  67 62 63 NT 54 55 68 64  ?Shoulder external rotation 77 77 84 NT 90 78 97 88  ?                        (Blank rows = not tested) ?  ?  ?  ?CERVICAL AROM: ?All within normal limits ?  ?UPPER EXTREMITY STRENGTH: Not tested ?  ?  ?LYMPHEDEMA ASSESSMENTS:  ?  ?LANDMARK RIGHT ?11/27/2021 LEFT ?11/27/2021 RIGHT ?02/03/2022 LEFT ?02/03/2022  ?10 cm proximal to olecranon process 32 31.9 31.8 31.5  ?Olecranon process 27 27 27.7 26.4  ?10 cm proximal to ulnar styloid process 23.7 23 23.3 22.7  ?Just proximal to ulnar styloid process 16.4 16.8 16.1 16.7  ?Across hand at thumb web space 19 18.7 19 18.8  ?At base of 2nd digit 6.8 7.5 6.7 7.5  ?(Blank rows = not tested) ?  ?Treatment: ?03/03/2022 ?THERAPEUTIC EXS;pt reports no real benefit from pulleys so held today ?Supine clasped hands flexion and star gazer x 5 ? MANUALSoft tissue mobilization to left UT, pectorals and lats in supine. ?PROM to left shoulder flexion, scaption, abduction, IR and ER. ?Remeasured pt for progress note and reviewed goals with pt. ? ? ? ?02/26/22: ?Therapeutic Exs: ?Pulleys: Flex and Abd x2 mins each  ?Ball roll up wall into flexion x10 and then Rt UE abd x5 and Lt UE scaption x5 ?Supine for shoulder presses into mat 5x, 5 sec holds ?Manual Therapy:  ?P/ROM: In Supine for Lt>Rt shoulder into flexion, abd and D2 to pts tolerance with gentle distraction during and VCs to relax with Lt UE though pt was better able to relax today ?STM: In Rt S/L with cocoa butter to Lt periscapular and UT area where trigger points palpable, pt slightly less tender to touch today than at last session ? ? ?02/24/22: ?Therapeutic Exs: ?Pulleys: Flex and Abd x2 mins each  ?Ball roll up wall into flexion x10 and then scaption x5 returning therapist demo and VCs not to push into pain ?Supine for shoulder presses into mat 5x, 5 sec holds ?Manual Therapy:  ?In Supine for Lt>Rt shoulder P/ROM into flexion, abd and D2 to pts tolerance with gentle distraction during and VCs to relax with  Lt UE ? ?02/21/22  ?Pulleys x 2 min in direction of flexion and abduction with v/c to keep elbows staight ?Ball up wall x 10 reps in to flexion with end range stretch and pt returning therapist demo ?Ball up wall in to abduction x 10 reps on R and 5 on L with pt feeling increased tightness on L ?Manual therapy: PROM to L shoulder in to flexion and abduction with pt able to relax by end of session and achieve full PROM with prolonged holds ?Educated pt in supine dowel exercises in to flexion and abduction and pt return demonstrated 3 reps each ? ?             ?Surgery type/Date: 01/07/2022 ?Number of lymph  nodes removed: 2 on left ?Current/past treatment (chemo, radiation, hormone therapy): Radiation to chest for Non-hodgkins lymphoma in 1985 ?Other symptoms:  ?Heaviness/tightness Yes ?Pain Yes ?Pitting edema No ?Infections No ?Decreased scar mobility Yes ?Stemmer sign No ?  ?  ?PATIENT EDUCATION:  ?Education details: need to decrease use of post op pillow to help decrease guarding and muscle tightness ?Person educated: Patient  ?Education method: Explanation,  ?Education comprehension: verbalized understanding  ?  ?  ?HOME EXERCISE PROGRAM: ?           Reviewed previously given post op HEP. Performed supine clasped hands flexion and stargazer today with improved ease ?4/14- issued supine dowel flexion and abduction  ?  ?ASSESSMENT: ?  ?CLINICAL IMPRESSION: ?Pt was initially very tense today. We continued AAROM exercises but left out pulleys secondary to pt reporting no benefit from them. Pt expressed concerns about swelling at left inferior axillary region with soreness. She may benefit from MLD to this area.  Performed Soft tissue mobilization and PROM.   Pt demonstrated excellent improvement in AROM today.She will benefit from continued skilled therapy to address deficits and return to PLOF ? ? ?Pt will benefit from skilled therapeutic intervention to improve on the following deficits: Decreased knowledge of  precautions, impaired UE functional use, pain, decreased ROM, postural dysfunction.  ?  ?PT treatment/interventions: ADL/Self care home management, Therapeutic exercises, Therapeutic activity, Neuromuscular re-ed

## 2022-03-05 ENCOUNTER — Ambulatory Visit: Payer: BC Managed Care – PPO

## 2022-03-05 DIAGNOSIS — C50212 Malignant neoplasm of upper-inner quadrant of left female breast: Secondary | ICD-10-CM | POA: Diagnosis not present

## 2022-03-05 DIAGNOSIS — R293 Abnormal posture: Secondary | ICD-10-CM

## 2022-03-05 DIAGNOSIS — Z483 Aftercare following surgery for neoplasm: Secondary | ICD-10-CM

## 2022-03-05 NOTE — Therapy (Signed)
?OUTPATIENT PHYSICAL THERAPY TREATMENT NOTE ? ? ?Patient Name: Erika Hodge ?MRN: [id number] ?DOB:1966-11-08, 56 y.o., female ?Today's Date: 03/05/2022 ? ?PCP: Bonnita Nasuti, MD ?REFERRING PROVIDER: Erroll Luna, MD ? ?END OF SESSION:  ? PT End of Session - 03/05/22 1301   ? ? Visit Number 7   ? Number of Visits 14   ? Date for PT Re-Evaluation 03/31/22   ? PT Start Time 1303   ? PT Stop Time 1358   ? PT Time Calculation (min) 55 min   ? Activity Tolerance Patient tolerated treatment well   ? Behavior During Therapy [facility] for tasks assessed/performed   ? ?  ?  ? ?  ? ? ? ?Past Medical History:  ?Diagnosis Date  ? Anxiety   ? Complication of anesthesia   ? HA  ? Non Hodgkin's lymphoma ([place])   ? Psoriasis   ? ?Past Surgical History:  ?Procedure Laterality Date  ? BREAST BIOPSY Left 11/20/2021  ? BREAST RECONSTRUCTION WITH PLACEMENT OF TISSUE EXPANDER AND ALLODERM Bilateral 01/07/2022  ? Procedure: BILATERAL BREAST RECONSTRUCTION WITH PLACEMENT OF TISSUE EXPANDERS AND ALLODERM;  Surgeon: Irene Limbo, MD;  Location: [place];  Service: Plastics;  Laterality: Bilateral;  ? NIPPLE SPARING MASTECTOMY Right 01/07/2022  ? Procedure: RIGHT NIPPLE SPARING MASTECTOMY;  Surgeon: Erroll Luna, MD;  Location: [place];  Service: General;  Laterality: Right;  ? NIPPLE SPARING MASTECTOMY WITH SENTINEL LYMPH NODE BIOPSY Left 01/07/2022  ? Procedure: LEFT NIPPLE SPARING MASTECTOMY WITH SENTINEL LYMPH NODE BIOPSY;  Surgeon: Erroll Luna, MD;  Location: [place];  Service: General;  Laterality: Left;  ? TUBAL LIGATION    ? ?Patient Active Problem List  ? Diagnosis Date Noted  ? Invasive lobular carcinoma of right breast, stage 1 ([place]) 01/24/2022  ? Genetic testing 12/11/2021  ? Family history of breast cancer 11/29/2021  ? Malignant neoplasm of upper-inner quadrant of left breast in female, estrogen receptor positive ([place]) 11/26/2021  ? ? ?REFERRING DIAG: s/p bilateral  Mastectomies with left SLNB ? ?THERAPY DIAG:  ?Aftercare following surgery for neoplasm ? ?Abnormal posture ? ?Malignant neoplasm of upper-inner quadrant of left breast in female, estrogen receptor positive ([place]) ? ?PERTINENT HISTORY: Patient was diagnosed on 09/10/2021 with 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 Two negative axillary nodes were removed on the left side. An incidental finding of right breast invasive lobular carcinoma was also found and is ER/PR positive and HER2 negative. It is weakly ER positive, PR negative and HER2 negative with a Ki67 of 20%. She has a left wrist plate from a previous surgery in 2006 and a history of non-Hodgkins Lymphoma in 1985. ? ?PRECAUTIONS: left lymphedema risk, hx of Non-Hodgkins Lymphoma, left wrist plate from prior surgery ? ?SUBJECTIVE:  ?I did good after Monday and I feel better today.  I have been wearing the compression bra and that seems to help. Still feel a little swollen under left arm. ?PAIN:  ?Are you having pain? No ? ? ?  ?02/03/2022 OBSERVATIONS: ?           Right breast with increased edema compared to left. Left inferior breast incision appears to be slightly macerated. Non-stick pad applied under each breast to reduce fluid leakage. Left axillary incision appears to be healing well.  ?  ?POSTURE:  ?Forward head, rounded shoulders ?  ?LYMPHEDEMA ASSESSMENT:  ?  ?  A/PROM Right ?11/27/2021 Left ?11/27/2021 Right ?02/03/2022 Left  ?02/03/2022 Right 02/21/22 Left 02/21/22 Right ?03/03/2022 Left ?03/03/2022  ?Shoulder extension 38 48 39 32 61 54 51 58  ?Shoulder flexion 157 152 100 51 148 115 163 143  ?Shoulder abduction 165 163 60 58 169 120 179 133  ?Shoulder internal rotation 67 62 63 NT 54 55 68 64  ?Shoulder external rotation 77 77 84 NT 90 78 97 88  ?                        (Blank rows = not tested) ?  ?  ?  ?CERVICAL AROM: ?All  within normal limits ?  ?UPPER EXTREMITY STRENGTH: Not tested ?  ?  ?LYMPHEDEMA ASSESSMENTS:  ?  ?LANDMARK RIGHT ?11/27/2021 LEFT ?11/27/2021 RIGHT ?02/03/2022 LEFT ?02/03/2022  ?10 cm proximal to olecranon process 32 31.9 31.8 31.5  ?Olecranon process 27 27 27.7 26.4  ?10 cm proximal to ulnar styloid process 23.7 23 23.3 22.7  ?Just proximal to ulnar styloid process 16.4 16.8 16.1 16.7  ?Across hand at thumb web space 19 18.7 19 18.8  ?At base of 2nd digit 6.8 7.5 6.7 7.5  ?(Blank rows = not tested) ?  ?Treatment: ? ?03/05/2022 ?MANUAL ?Soft tissue mobilization to left UT, pectorals and lats in supine and Ut and scapular area in right SL.Marland Kitchen ?PROM to left shoulder flexion, scaption, abduction, IR and ER. Multiple VC's to pt to relax her arm, improved with practice ?  THERAPEUTIC EXS; ?Strargazer and AA flexion x5 , supine wand flexion and scaption x3   with eduction to breathe throughout and relax, multiple VC's ?LTR with emphasis on knees to the right x5 and left x 3 ? ? ?03/03/2022 ?THERAPEUTIC EXS;pt reports no real benefit from pulleys so held today ?Supine clasped hands flexion and star gazer x 5 ? MANUALSoft tissue mobilization to left UT, pectorals and lats in supine. ?PROM to left shoulder flexion, scaption, abduction, IR and ER. ?Remeasured pt for progress note and reviewed goals with pt. ? ? ? ?02/26/22: ?Therapeutic Exs: ?Pulleys: Flex and Abd x2 mins each  ?Ball roll up wall into flexion x10 and then Rt UE abd x5 and Lt UE scaption x5 ?Supine for shoulder presses into mat 5x, 5 sec holds ?Manual Therapy:  ?P/ROM: In Supine for Lt>Rt shoulder into flexion, abd and D2 to pts tolerance with gentle distraction during and VCs to relax with Lt UE though pt was better able to relax today ?STM: In Rt S/L with cocoa butter to Lt periscapular and UT area where trigger points palpable, pt slightly less tender to touch today than at last session ? ? ?02/24/22: ?Therapeutic Exs: ?Pulleys: Flex and Abd x2 mins each  ?Ball roll  up wall into flexion x10 and then scaption x5 returning therapist demo and VCs not to push into pain ?Supine for shoulder presses into mat 5x, 5 sec holds ?Manual Therapy:  ?In Supine for Lt>Rt shoulder P/ROM into flexion, abd and D2 to pts tolerance with gentle distraction during and VCs to relax with Lt UE ? ?02/21/22  ?Pulleys x 2 min in direction of flexion and abduction with v/c to keep elbows staight ?Ball up wall x 10 reps in to flexion with end range stretch and pt returning therapist demo ?Ball up wall in to abduction x 10 reps on R and 5 on L with pt feeling increased tightness on L ?Manual therapy: PROM to L shoulder in to flexion and  abduction with pt able to relax by end of session and achieve full PROM with prolonged holds ?Educated pt in supine dowel exercises in to flexion and abduction and pt return demonstrated 3 reps each ? ?             ?Surgery type/Date: 01/07/2022 ?Number of lymph nodes removed: 2 on left ?Current/past treatment (chemo, radiation, hormone therapy): Radiation to chest for Non-hodgkins lymphoma in 1985 ?Other symptoms:  ?Heaviness/tightness Yes ?Pain Yes ?Pitting edema No ?Infections No ?Decreased scar mobility Yes ?Stemmer sign No ?  ?  ?PATIENT EDUCATION:  ?Education details: lower trunk rotation ?Person educated: Patient  ?Education method: Explanation, handout ?Education comprehension: verbalized and demonstrated understanding  ?  ?  ?HOME EXERCISE PROGRAM: ?           Reviewed previously given post op HEP. Performed supine clasped hands flexion and stargazer today with improved ease ?4/14- issued supine dowel flexion and abduction , 03/05/2022 Lower trunk rotation with arms outstretched ?  ?ASSESSMENT: ?  ?CLINICAL IMPRESSION: ?Pt continues to be tense and guarded and to require VC's throughout to relax.  She relaxed better for PROM today after initial VC's and talking with pt to distract her. ROM was much improved passively today.  She loved the lower trunk rotation stretch  to the right and felt good stretch in the left lateral trunk and chest ? ? ? ?Pt will benefit from skilled therapeutic intervention to improve on the following deficits: Decreased knowledge of precautions, imp

## 2022-03-06 ENCOUNTER — Encounter: Payer: Self-pay | Admitting: Hematology

## 2022-03-06 ENCOUNTER — Ambulatory Visit: Payer: BC Managed Care – PPO

## 2022-03-06 ENCOUNTER — Inpatient Hospital Stay: Payer: BC Managed Care – PPO

## 2022-03-06 ENCOUNTER — Inpatient Hospital Stay: Payer: BC Managed Care – PPO | Admitting: Hematology

## 2022-03-06 ENCOUNTER — Other Ambulatory Visit: Payer: Self-pay

## 2022-03-06 VITALS — BP 144/77 | HR 93 | Temp 97.9°F | Resp 18 | Wt 175.6 lb

## 2022-03-06 DIAGNOSIS — C50911 Malignant neoplasm of unspecified site of right female breast: Secondary | ICD-10-CM

## 2022-03-06 DIAGNOSIS — Z170 Estrogen receptor positive status [ER+]: Secondary | ICD-10-CM | POA: Diagnosis not present

## 2022-03-06 DIAGNOSIS — C50212 Malignant neoplasm of upper-inner quadrant of left female breast: Secondary | ICD-10-CM

## 2022-03-06 DIAGNOSIS — C50919 Malignant neoplasm of unspecified site of unspecified female breast: Secondary | ICD-10-CM

## 2022-03-06 DIAGNOSIS — Z5111 Encounter for antineoplastic chemotherapy: Secondary | ICD-10-CM | POA: Diagnosis not present

## 2022-03-06 LAB — CBC WITH DIFFERENTIAL (CANCER CENTER ONLY)
Abs Immature Granulocytes: 0.06 10*3/uL (ref 0.00–0.07)
Basophils Absolute: 0.1 10*3/uL (ref 0.0–0.1)
Basophils Relative: 1 %
Eosinophils Absolute: 0 10*3/uL (ref 0.0–0.5)
Eosinophils Relative: 0 %
HCT: 36.8 % (ref 36.0–46.0)
Hemoglobin: 11.8 g/dL — ABNORMAL LOW (ref 12.0–15.0)
Immature Granulocytes: 1 %
Lymphocytes Relative: 10 %
Lymphs Abs: 1 10*3/uL (ref 0.7–4.0)
MCH: 30.4 pg (ref 26.0–34.0)
MCHC: 32.1 g/dL (ref 30.0–36.0)
MCV: 94.8 fL (ref 80.0–100.0)
Monocytes Absolute: 1.1 10*3/uL — ABNORMAL HIGH (ref 0.1–1.0)
Monocytes Relative: 11 %
Neutro Abs: 7.6 10*3/uL (ref 1.7–7.7)
Neutrophils Relative %: 77 %
Platelet Count: 685 10*3/uL — ABNORMAL HIGH (ref 150–400)
RBC: 3.88 MIL/uL (ref 3.87–5.11)
RDW: 13.5 % (ref 11.5–15.5)
WBC Count: 9.9 10*3/uL (ref 4.0–10.5)
nRBC: 0 % (ref 0.0–0.2)

## 2022-03-06 LAB — CMP (CANCER CENTER ONLY)
ALT: 26 U/L (ref 0–44)
AST: 31 U/L (ref 15–41)
Albumin: 4.3 g/dL (ref 3.5–5.0)
Alkaline Phosphatase: 70 U/L (ref 38–126)
Anion gap: 7 (ref 5–15)
BUN: 10 mg/dL (ref 6–20)
CO2: 24 mmol/L (ref 22–32)
Calcium: 9.3 mg/dL (ref 8.9–10.3)
Chloride: 105 mmol/L (ref 98–111)
Creatinine: 0.72 mg/dL (ref 0.44–1.00)
GFR, Estimated: >60 mL/min (ref >60)
Glucose, Bld: 99 mg/dL (ref 70–99)
Potassium: 4 mmol/L (ref 3.5–5.1)
Sodium: 136 mmol/L (ref 135–145)
Total Bilirubin: 0.3 mg/dL (ref 0.3–1.2)
Total Protein: 7.1 g/dL (ref 6.5–8.1)

## 2022-03-06 MED ORDER — PALONOSETRON HCL INJECTION 0.25 MG/5ML
0.2500 mg | Freq: Once | INTRAVENOUS | Status: AC
  Administered 2022-03-06: 0.25 mg via INTRAVENOUS
  Filled 2022-03-06: qty 5

## 2022-03-06 MED ORDER — SODIUM CHLORIDE 0.9 % IV SOLN: Freq: Once | INTRAVENOUS | Status: AC

## 2022-03-06 MED ORDER — SODIUM CHLORIDE 0.9 % IV SOLN
600.0000 mg/m2 | Freq: Once | INTRAVENOUS | Status: AC
  Administered 2022-03-06: 1140 mg via INTRAVENOUS
  Filled 2022-03-06: qty 57

## 2022-03-06 MED ORDER — SODIUM CHLORIDE 0.9 % IV SOLN
10.0000 mg | Freq: Once | INTRAVENOUS | Status: AC
  Administered 2022-03-06: 10 mg via INTRAVENOUS
  Filled 2022-03-06: qty 10

## 2022-03-06 MED ORDER — SODIUM CHLORIDE 0.9 % IV SOLN
75.0000 mg/m2 | Freq: Once | INTRAVENOUS | Status: AC
  Administered 2022-03-06: 140 mg via INTRAVENOUS
  Filled 2022-03-06: qty 14

## 2022-03-06 NOTE — Progress Notes (Signed)
?[place]   ?[phone] [phone]   ?Clinic Follow up Note  ? ?Patient Care Team: ?Bonnita Nasuti, MD as PCP - General (Internal Medicine) ?Erroll Luna, MD as Consulting Physician (General Surgery) ?Truitt Merle, MD as Consulting Physician (Hematology) ?Gery Pray, MD as Consulting Physician (Radiation Oncology) ?Mauro Kaufmann, RN as Oncology Nurse Navigator ?Rockwell Germany, RN as Oncology Nurse Navigator ? ?Date of Service:  03/06/2022 ? ?CHIEF COMPLAINT: f/u of bilateral breast cancer ? ?CURRENT THERAPY:  ?Adjuvant TC, q21d, starting 02/12/22 ? ?ASSESSMENT & PLAN:  ?Erika Hodge is a 56 y.o. post-menopausal female with  ? ?1. 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 ?-she opted to proceed with b/l mastectomies with reconstruction on 01/07/22 with Dr. Brantley Stage and Dr. Iran Planas. Pathology revealed b/l breast cancers. Left breast showed 2.1 cm invasive and in situ ductal carcinoma. Margins and lymph nodes negative (0/2). Repeated ER/PR/HER2 were all negative (ER 10% weak (+) on biopsy) ?-she developed left IMF necrotic tissue, excised by Dr. Iran Planas on 01/27/22. ?-because she was likely treated with adriamycin for her lymphoma, she is not a candidate for more of this medicine. Therefore, I recommend 6 cycles of TC (docetaxel and cytoxan). She declined port placement.  ?-she began TC on 02/12/22. She tolerated well overall except for moderate constipation. She had severe pain from the udenyca injection, will hold it for next cycle  ?-labs reviewed, overall stable, and as expected for being on treatment. Overall no concern. We will hold udenyca. ?  ?2. Symptom Management: Constipation, Bone pain  ?-secondary to supportive meds ?-she discussed management with my nurse Tammi Sou on 02/17/22 (see phone and MyChart notes) ?-she  reports tylenol and claritin did not relieve her pain. ?  ?3. Invasive lobular carcinoma of right breast, stage I pT1c, ER weakly+/PR+/Her2-, and DCIS  ?-incidental finding on b/l mastectomies on 01/07/22 for left breast cancer. Pathology from right breast showed: 1.1 cm invasive lobular carcinoma, grade 1, in LIQ; 1 cm DCIS, intermediate grade, in UIQ. ILC showed ER 30% weakly positive, PR 80% moderately positive, Her2 negative. ILC focally involves adjacent anterior margin.  ?-we will discuss antiestrogen therapy after she finishes chemo. ?  ?4. H/o Non-Hodgkin's Lymphoma ?-in 1980's (pt was in 8th grade), received radiation to the mid chest. ?-per pt, she also received three years of systemic treatment. She reports she was treated at [facility]. Her records were too old to be received, but she likely received adriamycin during that time. ?  ?5. Psoriasis  ?-previously on Tremfya injections, was told by dermatology that she could not restart due to h/o cancer. ?-I do not recommend immunotherapy because of this. ?-followed by Dr. Abner Greenspan with [facility], per pt ?  ?  ?PLAN:  ?-proceed with C2 TC today ? -hold Udenyca this cycle due to severe bone pain ?-lab, f/u, and TC in 3 and 6 weeks ? ? ?No problem-specific Assessment & Plan notes found for this encounter. ? ? ?SUMMARY OF ONCOLOGIC HISTORY: ?Oncology History Overview Note  ? Cancer Staging  ?Invasive lobular carcinoma of right breast, stage 1 (HCC) ?Staging form: Breast, AJCC 8th Edition ?- Clinical stage from 01/07/2022: Stage Unknown (cT1c, cNX, cM0, G1, ER+, PR+, HER2-) - Signed by Truitt Merle, MD on 01/24/2022 ? ?Malignant neoplasm of upper-inner quadrant of left breast in female, estrogen receptor positive ([place]) ?  Staging form: Breast, AJCC 8th Edition ?- Clinical stage from 11/20/2021: Stage IA (cT1c, cN0, cM0, G2, ER+, PR-, HER2-) - Signed by Truitt Merle, MD on 11/26/2021 ?- Pathologic stage from 01/07/2022: Stage IIA (pT2, pN0, cM0, G2, ER-, PR-, HER2-) -  Signed by Truitt Merle, MD on 01/24/2022 ? ? ?  ?Malignant neoplasm of upper-inner quadrant of left breast in female, estrogen receptor positive ([place])  ?11/05/2021 Mammogram  ? CLINICAL DATA:  Screening recall for a possible left breast ?asymmetry. ?  ?EXAM: ?DIGITAL DIAGNOSTIC UNILATERAL LEFT MAMMOGRAM WITH TOMOSYNTHESIS AND CAD; ULTRASOUND LEFT BREAST LIMITED ? ?IMPRESSION: ?1. Irregular 1.2 cm mass in the left breast at 11 o'clock, 2 cm the ?nipple, suspicious for malignancy. ?  ?11/20/2021 Cancer Staging  ? Staging form: Breast, AJCC 8th Edition ?- Clinical stage from 11/20/2021: Stage IA (cT1c, cN0, cM0, G2, ER+, PR-, HER2-) - Signed by Truitt Merle, MD on 11/26/2021 ?Stage prefix: Initial diagnosis ?Histologic grading system: 3 grade system ? ?  ?11/20/2021 Initial Biopsy  ? Diagnosis ?Breast, left, needle core biopsy, 11 o'clock, ribbon clip ?- INVASIVE DUCTAL CARCINOMA ?- DUCTAL CARCINOMA IN SITU ?- SEE COMMENT ?Microscopic Comment ?based on the biopsy, the carcinoma appears Nottingham grade 2 of 3 and measures 0.6 cm in greatest linear extent. ? ?PROGNOSTIC INDICATORS ?Results: ?The tumor cells are NEGATIVE for Her2 (1+). ?Estrogen Receptor: 10%, POSITIVE, WEAK STAINING INTENSITY ?Progesterone Receptor: 0%, NEGATIVE ?Proliferation Marker Ki67: 20% ?  ?11/26/2021 Initial Diagnosis  ? Malignant neoplasm of upper-inner quadrant of left breast in female, estrogen receptor positive ([place]) ? ?  ?11/27/2021 Genetic Testing  ? [employer] was Negative. Report date is 12/11/2021. ? ?The CancerNext gene panel offered by [employer] includes sequencing, rearrangement analysis, and RNA analysis for the following 36 genes:   APC, ATM, AXIN2, BARD1, BMPR1A, BRCA1, BRCA2, BRIP1, CDH1, CDK4, CDKN2A, CHEK2, DICER1, HOXB13, EPCAM, GREM1, MLH1, MSH2, MSH3, MSH6, MUTYH, NBN, NF1, NTHL1, PALB2, PMS2, POLD1, POLE, PTEN, RAD51C, RAD51D, RECQL, SMAD4, SMARCA4, STK11, and TP53.  ?  ?01/07/2022 Cancer Staging  ? Staging form: Breast, AJCC  8th Edition ?- Pathologic stage from 01/07/2022: Stage IIA (pT2, pN0, cM0, G2, ER-, PR-, HER2-) - Signed by Truitt Merle, MD on 01/24/2022 ?Stage prefix: Initial diagnosis ?Histologic grading system: 3 grade system ?Residual tumor (R): R0 - None ? ?  ?01/07/2022 Definitive Surgery  ? FINAL MICROSCOPIC DIAGNOSIS:  ? ?A. BREAST, RIGHT NIPPLE, BIOPSY:  ?- Benign breast tissue.  ?- No malignancy identified.  ? ?B. BREAST, LEFT NIPPLE, BIOPSY:  ?- Benign breast tissue.  ?- No malignancy identified.  ? ?C. BREAST, RIGHT, MASTECTOMY:  ?- Invasive lobular carcinoma, 1.1 cm.  ?- Ductal carcinoma in situ, 1 cm.  ?- Invasive carcinoma focally extends to the anterior margin.  ?- Fibrocystic changes with usual ductal hyperplasia and calcifications.  ?- Sclerosing adenosis and fibroadenomatoid change.  ?- See oncology table and comment.  ? ?D. LYMPH NODE, LEFT AXILLARY, SENTINEL, EXCISION:  ?- One lymph node negative for metastatic carcinoma (0/1).  ? ?E. LYMPH NODE, LEFT AXILLARY, SENTINEL, EXCISION:  ?- One lymph node negative for metastatic carcinoma (0/1).  ? ?F. BREAST, LEFT, MASTECTOMY:  ?- Invasive and in situ ductal carcinoma, 2.1 cm.  ?- Margins negative for carcinoma.  ?- See oncology table.  ?  ?01/07/2022 Receptors her2  ? F1. PROGNOSTIC INDICATOR RESULTS:  ? ?The tumor cells are NEGATIVE for Her2 (0).  ? ?Estrogen Receptor: NEGATIVE  ?Progesterone Receptor: NEGATIVE  ?Proliferation Marker Ki-67: 10%  ?  ?  02/12/2022 -  Chemotherapy  ? Patient is on Treatment Plan : BREAST TC q21d  ? ?  ?  ?Invasive lobular carcinoma of right breast, stage 1 ([place])  ?11/27/2021 Genetic Testing  ? [employer] was Negative. Report date is 12/11/2021. ? ?The CancerNext gene panel offered by [employer] includes sequencing, rearrangement analysis, and RNA analysis for the following 36 genes:   APC, ATM, AXIN2, BARD1, BMPR1A, BRCA1, BRCA2, BRIP1, CDH1, CDK4, CDKN2A, CHEK2, DICER1, HOXB13, EPCAM, GREM1, MLH1, MSH2, MSH3, MSH6, MUTYH, NBN, NF1,  NTHL1, PALB2, PMS2, POLD1, POLE, PTEN, RAD51C, RAD51D, RECQL, SMAD4, SMARCA4, STK11, and TP53.  ?  ?01/07/2022 Cancer Staging  ? Staging form: Breast, AJCC 8th Edition ?- Clinical stage from 01/07/2022: Stage U

## 2022-03-07 ENCOUNTER — Encounter: Payer: Self-pay | Admitting: Hematology

## 2022-03-08 ENCOUNTER — Inpatient Hospital Stay: Payer: BC Managed Care – PPO

## 2022-03-10 ENCOUNTER — Ambulatory Visit: Payer: BC Managed Care – PPO | Attending: Surgery

## 2022-03-10 DIAGNOSIS — C50212 Malignant neoplasm of upper-inner quadrant of left female breast: Secondary | ICD-10-CM | POA: Insufficient documentation

## 2022-03-10 DIAGNOSIS — Z170 Estrogen receptor positive status [ER+]: Secondary | ICD-10-CM | POA: Insufficient documentation

## 2022-03-10 DIAGNOSIS — R293 Abnormal posture: Secondary | ICD-10-CM | POA: Insufficient documentation

## 2022-03-10 DIAGNOSIS — Z483 Aftercare following surgery for neoplasm: Secondary | ICD-10-CM | POA: Diagnosis present

## 2022-03-10 NOTE — Therapy (Signed)
?OUTPATIENT PHYSICAL THERAPY TREATMENT NOTE ? ? ?Patient Name: Erika Hodge ?MRN: [id number] ?DOB:Aug 03, 1966, 56 y.o., female ?Today's Date: 03/10/2022 ? ?PCP: Bonnita Nasuti, MD ?REFERRING PROVIDER: Erroll Luna, MD ? ?END OF SESSION:  ? PT End of Session - 03/10/22 1306   ? ? Visit Number 8   ? Number of Visits 14   ? Date for PT Re-Evaluation 03/31/22   ? PT Start Time 1303   ? PT Stop Time 6803   ? PT Time Calculation (min) 61 min   ? Activity Tolerance Patient tolerated treatment well   ? Behavior During Therapy [facility] for tasks assessed/performed   ? ?  ?  ? ?  ? ? ? ?Past Medical History:  ?Diagnosis Date  ? Anxiety   ? Complication of anesthesia   ? HA  ? Non Hodgkin's lymphoma ([place])   ? Psoriasis   ? ?Past Surgical History:  ?Procedure Laterality Date  ? BREAST BIOPSY Left 11/20/2021  ? BREAST RECONSTRUCTION WITH PLACEMENT OF TISSUE EXPANDER AND ALLODERM Bilateral 01/07/2022  ? Procedure: BILATERAL BREAST RECONSTRUCTION WITH PLACEMENT OF TISSUE EXPANDERS AND ALLODERM;  Surgeon: Irene Limbo, MD;  Location: [place];  Service: Plastics;  Laterality: Bilateral;  ? NIPPLE SPARING MASTECTOMY Right 01/07/2022  ? Procedure: RIGHT NIPPLE SPARING MASTECTOMY;  Surgeon: Erroll Luna, MD;  Location: [place];  Service: General;  Laterality: Right;  ? NIPPLE SPARING MASTECTOMY WITH SENTINEL LYMPH NODE BIOPSY Left 01/07/2022  ? Procedure: LEFT NIPPLE SPARING MASTECTOMY WITH SENTINEL LYMPH NODE BIOPSY;  Surgeon: Erroll Luna, MD;  Location: [place];  Service: General;  Laterality: Left;  ? TUBAL LIGATION    ? ?Patient Active Problem List  ? Diagnosis Date Noted  ? Invasive lobular carcinoma of right breast, stage 1 ([place]) 01/24/2022  ? Genetic testing 12/11/2021  ? Family history of breast cancer 11/29/2021  ? Malignant neoplasm of upper-inner quadrant of left breast in female, estrogen receptor positive ([place]) 11/26/2021  ? ? ?REFERRING DIAG: s/p bilateral  Mastectomies with left SLNB ? ?THERAPY DIAG:  ?Aftercare following surgery for neoplasm ? ?Abnormal posture ? ?Malignant neoplasm of upper-inner quadrant of left breast in female, estrogen receptor positive ([place]) ? ?PERTINENT HISTORY: Patient was diagnosed on 09/10/2021 with 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 Two negative axillary nodes were removed on the left side. An incidental finding of right breast invasive lobular carcinoma was also found and is ER/PR positive and HER2 negative. It is weakly ER positive, PR negative and HER2 negative with a Ki67 of 20%. She has a left wrist plate from a previous surgery in 2006 and a history of non-Hodgkins Lymphoma in 1985. ? ?PRECAUTIONS: left lymphedema risk, hx of Non-Hodgkins Lymphoma, left wrist plate from prior surgery ? ?SUBJECTIVE:  ?I did good after Monday and I feel better today.  I have been wearing the compression bra and that seems to help. Still feel a little swollen under left arm. ?PAIN:  ?Are you having pain? No ? ? ?  ?02/03/2022 OBSERVATIONS: ?           Right breast with increased edema compared to left. Left inferior breast incision appears to be slightly macerated. Non-stick pad applied under each breast to reduce fluid leakage. Left axillary incision appears to be healing well.  ?  ?POSTURE:  ?Forward head, rounded shoulders ?  ?LYMPHEDEMA ASSESSMENT:  ?  ?  A/PROM Right ?11/27/2021 Left ?11/27/2021 Right ?02/03/2022 Left  ?02/03/2022 Right 02/21/22 Left 02/21/22 Right ?03/03/2022 Left ?03/03/2022  ?Shoulder extension 38 48 39 32 61 54 51 58  ?Shoulder flexion 157 152 100 51 148 115 163 143  ?Shoulder abduction 165 163 60 58 169 120 179 133  ?Shoulder internal rotation 67 62 63 NT 54 55 68 64  ?Shoulder external rotation 77 77 84 NT 90 78 97 88  ?                        (Blank rows = not tested) ?  ?  ?  ?CERVICAL AROM: ?All  within normal limits ?  ?UPPER EXTREMITY STRENGTH: Not tested ?  ?  ?LYMPHEDEMA ASSESSMENTS:  ?  ?LANDMARK RIGHT ?11/27/2021 LEFT ?11/27/2021 RIGHT ?02/03/2022 LEFT ?02/03/2022  ?10 cm proximal to olecranon process 32 31.9 31.8 31.5  ?Olecranon process 27 27 27.7 26.4  ?10 cm proximal to ulnar styloid process 23.7 23 23.3 22.7  ?Just proximal to ulnar styloid process 16.4 16.8 16.1 16.7  ?Across hand at thumb web space 19 18.7 19 18.8  ?At base of 2nd digit 6.8 7.5 6.7 7.5  ?(Blank rows = not tested) ?  ?Treatment: ? 03/10/22: ?  THERAPEUTIC EXS; ?Pulleys x2 mins into abd, very mild stretch felt with flex so stopped ?Ball roll up wall into flexion with forward lean into end of stretch x10 ?Modified downward dog on wall 5x, 5 sec holds returning therapist demo ?MANUAL ?Soft tissue mobilization to left UT, pectorals and lats in supine and Ut and scapular area in right SL.Marland Kitchen ?PROM to left shoulder flexion, scaption, abduction, IR and ER. Multiple VC's to pt to relax her arm, improved with practice ?Manual Lymph Drainage: In Supine: Short neck, 5 diaphragmatic breaths, Lt inguinal nodes and Lt axillo-inguinal anastomosis, then focuse don lateral trunk where palpable fullness and instructed pt in same and had her return demo.  ?   ?03/05/2022 ?MANUAL ?Soft tissue mobilization to left UT, pectorals and lats in supine and Ut and scapular area in right SL.Marland Kitchen ?PROM to left shoulder flexion, scaption, abduction, IR and ER. Multiple VC's to pt to relax her arm, improved with practice ?  THERAPEUTIC EXS; ?Strargazer and AA flexion x5 , supine wand flexion and scaption x3   with eduction to breathe throughout and relax, multiple VC's ?LTR with emphasis on knees to the right x5 and left x 3 ? ? ?03/03/2022 ?THERAPEUTIC EXS;pt reports no real benefit from pulleys so held today ?Supine clasped hands flexion and star gazer x 5 ? MANUALSoft tissue mobilization to left UT, pectorals and lats in supine. ?PROM to left shoulder flexion, scaption,  abduction, IR and ER. ?Remeasured pt for progress note and reviewed goals with pt. ? ? ? ?02/26/22: ?Therapeutic Exs: ?Pulleys: Flex and Abd x2 mins each  ?Ball roll up wall into flexion x10 and then Rt UE abd x5 and Lt UE scaption x5 ?Supine for shoulder presses into mat 5x, 5 sec holds ?Manual Therapy:  ?P/ROM: In Supine for Lt>Rt shoulder into flexion, abd and D2 to pts tolerance with gentle distraction during and VCs to relax with Lt UE though pt was better able to relax today ?STM: In Rt S/L with cocoa butter to Lt periscapular and UT area where trigger points palpable, pt slightly less tender to touch today than at last session ? ? ? ?             ?Surgery  type/Date: 01/07/2022 ?Number of lymph nodes removed: 2 on left ?Current/past treatment (chemo, radiation, hormone therapy): Radiation to chest for Non-hodgkins lymphoma in 1985 ?Other symptoms:  ?Heaviness/tightness Yes ?Pain Yes ?Pitting edema No ?Infections No ?Decreased scar mobility Yes ?Stemmer sign No ?  ?  ?PATIENT EDUCATION:  ?Education details: Self MLD ?Person educated: Patient  ?Education method: Explanation, demonstration, handout ?Education comprehension: verbalized, demonstrated understanding, and pt returned demonstration  ?  ?  ?HOME EXERCISE PROGRAM: ?           Reviewed previously given post op HEP. Performed supine clasped hands flexion and stargazer today with improved ease ?4/14- issued supine dowel flexion and abduction , 03/05/2022 Lower trunk rotation with arms outstretched; 03/10/22: Self MLD to Lt lateral trunk ?  ?ASSESSMENT: ?  ?CLINICAL IMPRESSION: ?Pt much improved in ability to relax for manual therapy today with near full P/ROM attained during session. She also reports tenderness feeling much improved by end of session as well along Lt lateral trunk. Pt reports fullness at axilla and lateral trunk that was palpable so also included MLD in session today and educated pt in this as well, having her return demo and issued handout as  well.  ? ? ? ?Pt will benefit from skilled therapeutic intervention to improve on the following deficits: Decreased knowledge of precautions, impaired UE functional use, pain, decreased ROM, postural dysfunction

## 2022-03-10 NOTE — Patient Instructions (Addendum)
Self manual lymph drainage: ?Perform this sequence at least once a day.  Only give enough pressure to your skin to make the skin move. ? ?Hug yourself.  Do circles at your neck just above your collarbones.  Repeat this 10 times ? ?Diaphragmatic - Supine ? ? ?Inhale through nose making navel move out toward hands. Exhale through puckered lips, hands follow navel in. ?Repeat _5__ times. Rest _10__ seconds between repeats. ? ?LEG: Inguinal Nodes Stimulation ? ? ?With small finger side of hand against hip crease on involved side, gently perform circles at the crease. ?Repeat __10_ times.  ? ?Copyright ? VHI. All rights reserved.  ?Axilla to Inguinal Nodes - Sweep ? ? ?On involved side, pump _4__ times from armpit along side of trunk to hip crease. ?     ?Finish by doing the circles  same side groin. ? ?Cancer Rehab [phone]  ?

## 2022-03-12 ENCOUNTER — Ambulatory Visit: Payer: BC Managed Care – PPO

## 2022-03-12 DIAGNOSIS — C50212 Malignant neoplasm of upper-inner quadrant of left female breast: Secondary | ICD-10-CM

## 2022-03-12 DIAGNOSIS — R293 Abnormal posture: Secondary | ICD-10-CM

## 2022-03-12 DIAGNOSIS — Z483 Aftercare following surgery for neoplasm: Secondary | ICD-10-CM

## 2022-03-12 NOTE — Therapy (Signed)
?OUTPATIENT PHYSICAL THERAPY TREATMENT NOTE ? ? ?Patient Name: Erika Hodge ?MRN: [id number] ?DOB:23-May-1966, 56 y.o., female ?Today's Date: 03/12/2022 ? ?PCP: Bonnita Nasuti, MD ?REFERRING PROVIDER: Erroll Luna, MD ? ?END OF SESSION:  ? PT End of Session - 03/12/22 1302   ? ? Visit Number 9   ? Number of Visits 14   ? Date for PT Re-Evaluation 03/31/22   ? PT Start Time 1303   ? PT Stop Time 1400   ? PT Time Calculation (min) 57 min   ? Activity Tolerance Patient tolerated treatment well   ? Behavior During Therapy [facility] for tasks assessed/performed   ? ?  ?  ? ?  ? ? ? ?Past Medical History:  ?Diagnosis Date  ? Anxiety   ? Complication of anesthesia   ? HA  ? Non Hodgkin's lymphoma ([place])   ? Psoriasis   ? ?Past Surgical History:  ?Procedure Laterality Date  ? BREAST BIOPSY Left 11/20/2021  ? BREAST RECONSTRUCTION WITH PLACEMENT OF TISSUE EXPANDER AND ALLODERM Bilateral 01/07/2022  ? Procedure: BILATERAL BREAST RECONSTRUCTION WITH PLACEMENT OF TISSUE EXPANDERS AND ALLODERM;  Surgeon: Irene Limbo, MD;  Location: [place];  Service: Plastics;  Laterality: Bilateral;  ? NIPPLE SPARING MASTECTOMY Right 01/07/2022  ? Procedure: RIGHT NIPPLE SPARING MASTECTOMY;  Surgeon: Erroll Luna, MD;  Location: [place];  Service: General;  Laterality: Right;  ? NIPPLE SPARING MASTECTOMY WITH SENTINEL LYMPH NODE BIOPSY Left 01/07/2022  ? Procedure: LEFT NIPPLE SPARING MASTECTOMY WITH SENTINEL LYMPH NODE BIOPSY;  Surgeon: Erroll Luna, MD;  Location: [place];  Service: General;  Laterality: Left;  ? TUBAL LIGATION    ? ?Patient Active Problem List  ? Diagnosis Date Noted  ? Invasive lobular carcinoma of right breast, stage 1 ([place]) 01/24/2022  ? Genetic testing 12/11/2021  ? Family history of breast cancer 11/29/2021  ? Malignant neoplasm of upper-inner quadrant of left breast in female, estrogen receptor positive ([place]) 11/26/2021  ? ? ?REFERRING DIAG: s/p bilateral  Mastectomies with left SLNB ? ?THERAPY DIAG:  ?Aftercare following surgery for neoplasm ? ?Abnormal posture ? ?Malignant neoplasm of upper-inner quadrant of left breast in female, estrogen receptor positive ([place]) ? ?PERTINENT HISTORY: Patient was diagnosed on 09/10/2021 with 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 Two negative axillary nodes were removed on the left side. An incidental finding of right breast invasive lobular carcinoma was also found and is ER/PR positive and HER2 negative. It is weakly ER positive, PR negative and HER2 negative with a Ki67 of 20%. She has a left wrist plate from a previous surgery in 2006 and a history of non-Hodgkins Lymphoma in 1985. ? ?PRECAUTIONS: left lymphedema risk, hx of Non-Hodgkins Lymphoma, left wrist plate from prior surgery ? ?SUBJECTIVE: I am feeling a little rough since chemo, but starting to get back to normal now. Haven't been doing exercises as much secondary to not feeling great The pain is doing better.  I have been wearing the compression bra and it does seem to make the swelling better. My "chemo hand is very uncomfortable" but they told me it would be like this. ? ?PAIN:  ?Are you having pain? No ? ? ?  ?02/03/2022 OBSERVATIONS: ?           Right breast with increased edema compared to left. Left inferior breast incision appears to be slightly macerated. Non-stick  pad applied under each breast to reduce fluid leakage. Left axillary incision appears to be healing well.  ?  ?POSTURE:  ?Forward head, rounded shoulders ?  ?LYMPHEDEMA ASSESSMENT:  ?  ?A/PROM Right ?11/27/2021 Left ?11/27/2021 Right ?02/03/2022 Left  ?02/03/2022 Right 02/21/22 Left 02/21/22 Right ?03/03/2022 Left ?03/03/2022  ?Shoulder extension 38 48 39 32 61 54 51 58  ?Shoulder flexion 157 152 100 51 148 115 163 143  ?Shoulder abduction 165 163 60 58 169 120 179 133  ?Shoulder  internal rotation 67 62 63 NT 54 55 68 64  ?Shoulder external rotation 77 77 84 NT 90 78 97 88  ?                        (Blank rows = not tested) ?  ?  ?  ?CERVICAL AROM: ?All within normal limits ?  ?UPPER EXTREMITY STRENGTH: Not tested ?  ?  ?LYMPHEDEMA ASSESSMENTS:  ?  ?LANDMARK RIGHT ?11/27/2021 LEFT ?11/27/2021 RIGHT ?02/03/2022 LEFT ?02/03/2022  ?10 cm proximal to olecranon process 32 31.9 31.8 31.5  ?Olecranon process 27 27 27.7 26.4  ?10 cm proximal to ulnar styloid process 23.7 23 23.3 22.7  ?Just proximal to ulnar styloid process 16.4 16.8 16.1 16.7  ?Across hand at thumb web space 19 18.7 19 18.8  ?At base of 2nd digit 6.8 7.5 6.7 7.5  ?(Blank rows = not tested) ?  ?Treatment: ?03/12/2022 Pt observed with redness and cracked skin on right thumb webspace, and several blisters starting on left hand.  Will continue to watch and will see MD if any concerns for infection. ?THERAPEUTIC EXS; supine wand flexion and scaption x 5, LTR x 5 knees to the right ?Supine horizontal bilateral abd, flex , and bilateral ER with yellow band x 3., standing wall slide abduction x 2 to demonstrate change by moving closer to wall. ?MANUAL ?Soft tissue mobilization to left UT, pectorals and lats in supine and Ut and scapular area in right SL.Marland Kitchen ?PROM to left shoulder flexion, scaption, abduction, IR and ER. Multiple VC's to pt to relax her arm, improved with practice ?MLD bilateral supraclavicular, 5 diaphragmatic breaths, left inguinal LN's and Left axillo-inguinal pathway directing fluid away from lateral trunk repeating pathway and ending with inguinal LN's. Pt did not practice secondary to right hand very red and painful from "chemo hand" ? ? 03/10/22: ?  THERAPEUTIC EXS; ?Pulleys x2 mins into abd, very mild stretch felt with flex so stopped ?Ball roll up wall into flexion with forward lean into end of stretch x10 ?Modified downward dog on wall 5x, 5 sec holds returning therapist demo ?MANUAL ?Soft tissue mobilization to left UT,  pectorals and lats in supine and Ut and scapular area in right SL.Marland Kitchen ?PROM to left shoulder flexion, scaption, abduction, IR and ER. Multiple VC's to pt to relax her arm, improved with practice ?MLD bilateral supraclavicular, 5 diaphragmatic breaths, left inguinal LN's and Left axillo-inguinal pathway directing fluid away from lateral trunk repeating pathway and ending with inguinal LN's Verbally instructed pt but did not have her perform as her right hand is uncomfortable. ?03/05/2022 ?MANUAL ?Soft tissue mobilization to left UT, pectorals and lats in supine and Ut and scapular area in right SL.Marland Kitchen ?PROM to left shoulder flexion, scaption, abduction, IR and ER. Multiple VC's to pt to relax her arm, improved with practice ?  THERAPEUTIC EXS; ?Strargazer and AA flexion x5 , supine wand flexion and scaption x3   with eduction to breathe throughout  and relax, multiple VC's ?LTR with emphasis on knees to the right x5 and left x 3 ? ? ?03/03/2022 ?THERAPEUTIC EXS;pt reports no real benefit from pulleys so held today ?Supine clasped hands flexion and star gazer x 5 ? MANUALSoft tissue mobilization to left UT, pectorals and lats in supine. ?PROM to left shoulder flexion, scaption, abduction, IR and ER. ?Remeasured pt for progress note and reviewed goals with pt. ? ? ? ?02/26/22: ?Therapeutic Exs: ?Pulleys: Flex and Abd x2 mins each  ?Ball roll up wall into flexion x10 and then Rt UE abd x5 and Lt UE scaption x5 ?Supine for shoulder presses into mat 5x, 5 sec holds ?Manual Therapy:  ?P/ROM: In Supine for Lt>Rt shoulder into flexion, abd and D2 to pts tolerance with gentle distraction during and VCs to relax with Lt UE though pt was better able to relax today ?STM: In Rt S/L with cocoa butter to Lt periscapular and UT area where trigger points palpable, pt slightly less tender to touch today than at last session ? ? ? ?             ?Surgery type/Date: 01/07/2022 ?Number of lymph nodes removed: 2 on left ?Current/past treatment  (chemo, radiation, hormone therapy): Radiation to chest for Non-hodgkins lymphoma in 1985 ?Other symptoms:  ?Heaviness/tightness Yes ?Pain Yes ?Pitting edema No ?Infections No ?Decreased scar mobility Yes ?[place]

## 2022-03-13 ENCOUNTER — Telehealth: Payer: Self-pay | Admitting: Hematology

## 2022-03-13 NOTE — Telephone Encounter (Signed)
Scheduled follow-up appointment per 4/27 los. Patient is aware. 

## 2022-03-14 ENCOUNTER — Encounter: Payer: Self-pay | Admitting: Hematology

## 2022-03-17 ENCOUNTER — Ambulatory Visit: Payer: BC Managed Care – PPO

## 2022-03-17 DIAGNOSIS — R293 Abnormal posture: Secondary | ICD-10-CM

## 2022-03-17 DIAGNOSIS — Z483 Aftercare following surgery for neoplasm: Secondary | ICD-10-CM | POA: Diagnosis not present

## 2022-03-17 DIAGNOSIS — C50212 Malignant neoplasm of upper-inner quadrant of left female breast: Secondary | ICD-10-CM

## 2022-03-17 NOTE — Therapy (Signed)
?OUTPATIENT PHYSICAL THERAPY TREATMENT NOTE ? ? ?Patient Name: Erika Hodge ?MRN: [id number] ?DOB:1966/10/18, 56 y.o., female ?Today's Date: 03/17/2022 ? ?PCP: Bonnita Nasuti, MD ?REFERRING PROVIDER: Erroll Luna, MD ? ?END OF SESSION:  ? PT End of Session - 03/17/22 1300   ? ? Visit Number 10   ? Number of Visits 14   ? Date for PT Re-Evaluation 03/31/22   ? PT Start Time 1302   ? PT Stop Time 1355   ? PT Time Calculation (min) 53 min   ? Activity Tolerance Patient tolerated treatment well   ? Behavior During Therapy [facility] for tasks assessed/performed   ? ?  ?  ? ?  ? ? ? ?Past Medical History:  ?Diagnosis Date  ? Anxiety   ? Complication of anesthesia   ? HA  ? Non Hodgkin's lymphoma ([place])   ? Psoriasis   ? ?Past Surgical History:  ?Procedure Laterality Date  ? BREAST BIOPSY Left 11/20/2021  ? BREAST RECONSTRUCTION WITH PLACEMENT OF TISSUE EXPANDER AND ALLODERM Bilateral 01/07/2022  ? Procedure: BILATERAL BREAST RECONSTRUCTION WITH PLACEMENT OF TISSUE EXPANDERS AND ALLODERM;  Surgeon: Irene Limbo, MD;  Location: [place];  Service: Plastics;  Laterality: Bilateral;  ? NIPPLE SPARING MASTECTOMY Right 01/07/2022  ? Procedure: RIGHT NIPPLE SPARING MASTECTOMY;  Surgeon: Erroll Luna, MD;  Location: [place];  Service: General;  Laterality: Right;  ? NIPPLE SPARING MASTECTOMY WITH SENTINEL LYMPH NODE BIOPSY Left 01/07/2022  ? Procedure: LEFT NIPPLE SPARING MASTECTOMY WITH SENTINEL LYMPH NODE BIOPSY;  Surgeon: Erroll Luna, MD;  Location: [place];  Service: General;  Laterality: Left;  ? TUBAL LIGATION    ? ?Patient Active Problem List  ? Diagnosis Date Noted  ? Invasive lobular carcinoma of right breast, stage 1 ([place]) 01/24/2022  ? Genetic testing 12/11/2021  ? Family history of breast cancer 11/29/2021  ? Malignant neoplasm of upper-inner quadrant of left breast in female, estrogen receptor positive ([place]) 11/26/2021  ? ? ?REFERRING DIAG: s/p bilateral  Mastectomies with left SLNB ? ?THERAPY DIAG:  ?Aftercare following surgery for neoplasm ? ?Abnormal posture ? ?Malignant neoplasm of upper-inner quadrant of left breast in female, estrogen receptor positive ([place]) ? ?PERTINENT HISTORY: Patient was diagnosed on 09/10/2021 with 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 Two negative axillary nodes were removed on the left side. An incidental finding of right breast invasive lobular carcinoma was also found and is ER/PR positive and HER2 negative. It is weakly ER positive, PR negative and HER2 negative with a Ki67 of 20%. She has a left wrist plate from a previous surgery in 2006 and a history of non-Hodgkins Lymphoma in 1985. ? ?PRECAUTIONS: left lymphedema risk, hx of Non-Hodgkins Lymphoma, left wrist plate from prior surgery ? ?SUBJECTIVE:  I am really tired today. My right had is very blistered. I messaged the Dr. But I have not heard anything back yet since Friday. I have been trying to do the exercises. The trunk swelling seems to be doing better. ? ?PAIN:  ?Are you having pain? No ? ? ?  ?02/03/2022 OBSERVATIONS: ?           Right breast with increased edema compared to left. Left inferior breast incision appears to be slightly macerated. Non-stick pad applied under each breast to reduce fluid leakage. Left axillary incision appears to be healing well.  ?  ?POSTURE:  ?  Forward head, rounded shoulders ?  ?LYMPHEDEMA ASSESSMENT:  ?  ?A/PROM Right ?11/27/2021 Left ?11/27/2021 Right ?02/03/2022 Left  ?02/03/2022 Right 02/21/22 Left 02/21/22 Right ?03/03/2022 Left ?03/03/2022  ?Shoulder extension 38 48 39 32 61 54 51 58  ?Shoulder flexion 157 152 100 51 148 115 163 143  ?Shoulder abduction 165 163 60 58 169 120 179 133  ?Shoulder internal rotation 67 62 63 NT 54 55 68 64  ?Shoulder external rotation 77 77 84 NT 90 78 97 88  ?                         (Blank rows = not tested) ?  ?  ?  ?CERVICAL AROM: ?All within normal limits ?  ?UPPER EXTREMITY STRENGTH: Not tested ?  ?  ?LYMPHEDEMA ASSESSMENTS:  ?  ?LANDMARK RIGHT ?11/27/2021 LEFT ?11/27/2021 RIGHT ?02/03/2022 LEFT ?02/03/2022  ?10 cm proximal to olecranon process 32 31.9 31.8 31.5  ?Olecranon process 27 27 27.7 26.4  ?10 cm proximal to ulnar styloid process 23.7 23 23.3 22.7  ?Just proximal to ulnar styloid process 16.4 16.8 16.1 16.7  ?Across hand at thumb web space 19 18.7 19 18.8  ?At base of 2nd digit 6.8 7.5 6.7 7.5  ?(Blank rows = not tested) ?  ?Treatment: ?03/17/2022   ?Pts right hand very blistered from chemo. Took photo and messaged MD as pt wanted to know what to put on it. ?MANUAL ?Soft tissue mobilization to left UT, pectorals and lats in supine and Ut and scapular area in right SL.Marland Kitchen ?PROM to left shoulder flexion, scaption, abduction, IR and ER. Multiple VC's to pt to relax her arm, improved with practice ?MLD bilateral supraclavicular, 5 diaphragmatic breaths, left inguinal LN's and Left axillo-inguinal pathway directing fluid away from lateral trunk repeating pathway and ending with inguinal LN's. Pt did not practice secondary to right hand red and blistered from "chemo hand" ?Lower trunk rotation bilaterally x 5 with arms outstretched and with goal post position ? ? ?03/12/2022 Pt observed with redness and cracked skin on right thumb webspace, and several blisters starting on left hand.  Will continue to watch and will see MD if any concerns for infection. ?THERAPEUTIC EXS; supine wand flexion and scaption x 5, LTR x 5 knees to the right ?Supine horizontal bilateral abd, flex , and bilateral ER with yellow band x 3., standing wall slide abduction x 2 to demonstrate change by moving closer to wall. ?MANUAL ?Soft tissue mobilization to left UT, pectorals and lats in supine and Ut and scapular area in right SL.Marland Kitchen ?PROM to left shoulder flexion, scaption, abduction, IR and ER. Multiple VC's to pt to relax  her arm, improved with practice ?MLD bilateral supraclavicular, 5 diaphragmatic breaths, left inguinal LN's and Left axillo-inguinal pathway directing fluid away from lateral trunk repeating pathway and ending with inguinal LN's. Pt did not practice secondary to right hand very red and painful from "chemo hand" ? ? 03/10/22: ?  THERAPEUTIC EXS; ?Pulleys x2 mins into abd, very mild stretch felt with flex so stopped ?Ball roll up wall into flexion with forward lean into end of stretch x10 ?Modified downward dog on wall 5x, 5 sec holds returning therapist demo ?MANUAL ?Soft tissue mobilization to left UT, pectorals and lats in supine and Ut and scapular area in right SL.Marland Kitchen ?PROM to left shoulder flexion, scaption, abduction, IR and ER. Multiple VC's to pt to relax her arm, improved with practice ?MLD bilateral supraclavicular, 5 diaphragmatic breaths,  left inguinal LN's and Left axillo-inguinal pathway directing fluid away from lateral trunk repeating pathway and ending with inguinal LN's Verbally instructed pt but did not have her perform as her right hand is uncomfortable. ?03/05/2022 ?MANUAL ?Soft tissue mobilization to left UT, pectorals and lats in supine and Ut and scapular area in right SL.Marland Kitchen ?PROM to left shoulder flexion, scaption, abduction, IR and ER. Multiple VC's to pt to relax her arm, improved with practice ?  THERAPEUTIC EXS; ?Strargazer and AA flexion x5 , supine wand flexion and scaption x3   with eduction to breathe throughout and relax, multiple VC's ?LTR with emphasis on knees to the right x5 and left x 3 ? ? ?03/03/2022 ?THERAPEUTIC EXS;pt reports no real benefit from pulleys so held today ?Supine clasped hands flexion and star gazer x 5 ? MANUALSoft tissue mobilization to left UT, pectorals and lats in supine. ?PROM to left shoulder flexion, scaption, abduction, IR and ER. ?Remeasured pt for progress note and reviewed goals with pt. ? ? ? ? ? ?             ?Surgery type/Date: 01/07/2022 ?Number of lymph  nodes removed: 2 on left ?Current/past treatment (chemo, radiation, hormone therapy): Radiation to chest for Non-hodgkins lymphoma in 1985 ?Other symptoms:  ?Heaviness/tightness Yes ?Pain Yes ?Pitting edema

## 2022-03-24 ENCOUNTER — Ambulatory Visit: Payer: BC Managed Care – PPO

## 2022-03-24 DIAGNOSIS — Z483 Aftercare following surgery for neoplasm: Secondary | ICD-10-CM

## 2022-03-24 DIAGNOSIS — Z170 Estrogen receptor positive status [ER+]: Secondary | ICD-10-CM

## 2022-03-24 DIAGNOSIS — R293 Abnormal posture: Secondary | ICD-10-CM

## 2022-03-24 NOTE — Therapy (Signed)
?OUTPATIENT PHYSICAL THERAPY TREATMENT NOTE ? ? ?Patient Name: Erika Hodge ?MRN: [id number] ?DOB:June 20, 1966, 56 y.o., female ?Today's Date: 03/24/2022 ? ?PCP: Bonnita Nasuti, MD ?REFERRING PROVIDER: Erroll Luna, MD ? ?END OF SESSION:  ? PT End of Session - 03/24/22 1311   ? ? Visit Number 11   ? Number of Visits 14   ? Date for PT Re-Evaluation 03/31/22   ? PT Start Time 8938   ? PT Stop Time 1401   ? PT Time Calculation (min) 56 min   ? Activity Tolerance Patient tolerated treatment well   ? Behavior During Therapy [facility] for tasks assessed/performed   ? ?  ?  ? ?  ? ? ? ?Past Medical History:  ?Diagnosis Date  ? Anxiety   ? Complication of anesthesia   ? HA  ? Non Hodgkin's lymphoma ([place])   ? Psoriasis   ? ?Past Surgical History:  ?Procedure Laterality Date  ? BREAST BIOPSY Left 11/20/2021  ? BREAST RECONSTRUCTION WITH PLACEMENT OF TISSUE EXPANDER AND ALLODERM Bilateral 01/07/2022  ? Procedure: BILATERAL BREAST RECONSTRUCTION WITH PLACEMENT OF TISSUE EXPANDERS AND ALLODERM;  Surgeon: Irene Limbo, MD;  Location: [place];  Service: Plastics;  Laterality: Bilateral;  ? NIPPLE SPARING MASTECTOMY Right 01/07/2022  ? Procedure: RIGHT NIPPLE SPARING MASTECTOMY;  Surgeon: Erroll Luna, MD;  Location: [place];  Service: General;  Laterality: Right;  ? NIPPLE SPARING MASTECTOMY WITH SENTINEL LYMPH NODE BIOPSY Left 01/07/2022  ? Procedure: LEFT NIPPLE SPARING MASTECTOMY WITH SENTINEL LYMPH NODE BIOPSY;  Surgeon: Erroll Luna, MD;  Location: [place];  Service: General;  Laterality: Left;  ? TUBAL LIGATION    ? ?Patient Active Problem List  ? Diagnosis Date Noted  ? Invasive lobular carcinoma of right breast, stage 1 ([place]) 01/24/2022  ? Genetic testing 12/11/2021  ? Family history of breast cancer 11/29/2021  ? Malignant neoplasm of upper-inner quadrant of left breast in female, estrogen receptor positive ([place]) 11/26/2021  ? ? ?REFERRING DIAG: s/p bilateral  Mastectomies with left SLNB ? ?THERAPY DIAG:  ?Aftercare following surgery for neoplasm ? ?Abnormal posture ? ?Malignant neoplasm of upper-inner quadrant of left breast in female, estrogen receptor positive ([place]) ? ?PERTINENT HISTORY: Patient was diagnosed on 09/10/2021 with 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 Two negative axillary nodes were removed on the left side. An incidental finding of right breast invasive lobular carcinoma was also found and is ER/PR positive and HER2 negative. It is weakly ER positive, PR negative and HER2 negative with a Ki67 of 20%. She has a left wrist plate from a previous surgery in 2006 and a history of non-Hodgkins Lymphoma in 1985. ? ?PRECAUTIONS: left lymphedema risk, hx of Non-Hodgkins Lymphoma, left wrist plate from prior surgery ? ?SUBJECTIVE:  My doctor finally got back with me and said I could use neosporin on my hand so I've been doing that and it's a little better. I am more tired than I thought I'd be.  ? ?PAIN:  ?Are you having pain? No ? ? ?  ?02/03/2022 OBSERVATIONS: ?           Right breast with increased edema compared to left. Left inferior breast incision appears to be slightly macerated. Non-stick pad applied under each breast to reduce fluid leakage. Left axillary incision appears to be healing well.  ?  ?POSTURE:  ?Forward head, rounded shoulders ?  ?  LYMPHEDEMA ASSESSMENT:  ?  ?A/PROM Right ?11/27/2021 Left ?11/27/2021 Right ?02/03/2022 Left  ?02/03/2022 Right 02/21/22 Left 02/21/22 Right ?03/03/2022 Left ?03/03/2022  ?Shoulder extension 38 48 39 32 61 54 51 58  ?Shoulder flexion 157 152 100 51 148 115 163 143  ?Shoulder abduction 165 163 60 58 169 120 179 133  ?Shoulder internal rotation 67 62 63 NT 54 55 68 64  ?Shoulder external rotation 77 77 84 NT 90 78 97 88  ?                        (Blank rows = not tested) ?  ?  ?  ?CERVICAL  AROM: ?All within normal limits ?  ?UPPER EXTREMITY STRENGTH: Not tested ?  ?  ?LYMPHEDEMA ASSESSMENTS:  ?  ?LANDMARK RIGHT ?11/27/2021 LEFT ?11/27/2021 RIGHT ?02/03/2022 LEFT ?02/03/2022  ?10 cm proximal to olecranon process 32 31.9 31.8 31.5  ?Olecranon process 27 27 27.7 26.4  ?10 cm proximal to ulnar styloid process 23.7 23 23.3 22.7  ?Just proximal to ulnar styloid process 16.4 16.8 16.1 16.7  ?Across hand at thumb web space 19 18.7 19 18.8  ?At base of 2nd digit 6.8 7.5 6.7 7.5  ?(Blank rows = not tested) ?  ?Treatment: ?03/24/22: ?MANUAL ?Soft tissue mobilization to left UT, pectorals and lats in supine and UT and scapular area in right S/L. ?PROM to left shoulder flexion, abduction, and D2. Multiple VC's to pt to relax her arm, improved with practice ?MLD: bilateral supraclavicular, 5 diaphragmatic breaths, left inguinal LN's and Left axillo-inguinal pathway directing fluid away from lateral trunk repeating pathway and ending with inguinal LN's. Pt did not practice secondary to right hand red and blistered from "chemo hand" ? ?03/17/2022   ?Pts right hand very blistered from chemo. Took photo and messaged MD as pt wanted to know what to put on it. ?MANUAL ?Soft tissue mobilization to left UT, pectorals and lats in supine and Ut and scapular area in right SL.Marland Kitchen ?PROM to left shoulder flexion, scaption, abduction, IR and ER. Multiple VC's to pt to relax her arm, improved with practice ?MLD bilateral supraclavicular, 5 diaphragmatic breaths, left inguinal LN's and Left axillo-inguinal pathway directing fluid away from lateral trunk repeating pathway and ending with inguinal LN's. Pt did not practice secondary to right hand red and blistered from "chemo hand" ?Lower trunk rotation bilaterally x 5 with arms outstretched and with goal post position ? ? ?03/12/2022 Pt observed with redness and cracked skin on right thumb webspace, and several blisters starting on left hand.  Will continue to watch and will see MD if any  concerns for infection. ?THERAPEUTIC EXS; supine wand flexion and scaption x 5, LTR x 5 knees to the right ?Supine horizontal bilateral abd, flex , and bilateral ER with yellow band x 3., standing wall slide abduction x 2 to demonstrate change by moving closer to wall. ?MANUAL ?Soft tissue mobilization to left UT, pectorals and lats in supine and Ut and scapular area in right SL.Marland Kitchen ?PROM to left shoulder flexion, scaption, abduction, IR and ER. Multiple VC's to pt to relax her arm, improved with practice ?MLD bilateral supraclavicular, 5 diaphragmatic breaths, left inguinal LN's and Left axillo-inguinal pathway directing fluid away from lateral trunk repeating pathway and ending with inguinal LN's. Pt did not practice secondary to right hand very red and painful from "chemo hand" ? ? 03/10/22: ?  THERAPEUTIC EXS; ?Pulleys x2 mins into abd, very mild stretch felt with flex  so stopped ?Ball roll up wall into flexion with forward lean into end of stretch x10 ?Modified downward dog on wall 5x, 5 sec holds returning therapist demo ?MANUAL ?Soft tissue mobilization to left UT, pectorals and lats in supine and Ut and scapular area in right SL.Marland Kitchen ?PROM to left shoulder flexion, scaption, abduction, IR and ER. Multiple VC's to pt to relax her arm, improved with practice ?MLD bilateral supraclavicular, 5 diaphragmatic breaths, left inguinal LN's and Left axillo-inguinal pathway directing fluid away from lateral trunk repeating pathway and ending with inguinal LN's Verbally instructed pt but did not have her perform as her right hand is uncomfortable. ? ? ? ? ?             ?Surgery type/Date: 01/07/2022 ?Number of lymph nodes removed: 2 on left ?Current/past treatment (chemo, radiation, hormone therapy): Radiation to chest for Non-hodgkins lymphoma in 1985 ?Other symptoms:  ?Heaviness/tightness Yes ?Pain Yes ?Pitting edema No ?Infections No ?Decreased scar mobility Yes ?Stemmer sign No ?  ?  ?PATIENT EDUCATION:  ?Education details:  Self MLD ?Person educated: Patient  ?Education method: Explanation, demonstration, handout ?Education comprehension: verbalized, demonstrated understanding, and pt returned demonstration  ?  ?  ?[place]

## 2022-03-26 ENCOUNTER — Encounter: Payer: Self-pay | Admitting: Rehabilitation

## 2022-03-26 ENCOUNTER — Ambulatory Visit: Payer: BC Managed Care – PPO | Admitting: Rehabilitation

## 2022-03-26 DIAGNOSIS — Z483 Aftercare following surgery for neoplasm: Secondary | ICD-10-CM

## 2022-03-26 DIAGNOSIS — R293 Abnormal posture: Secondary | ICD-10-CM

## 2022-03-26 DIAGNOSIS — C50212 Malignant neoplasm of upper-inner quadrant of left female breast: Secondary | ICD-10-CM

## 2022-03-26 MED FILL — Dexamethasone Sodium Phosphate Inj 100 MG/10ML: INTRAMUSCULAR | Qty: 1 | Status: AC

## 2022-03-26 NOTE — Therapy (Signed)
?OUTPATIENT PHYSICAL THERAPY TREATMENT NOTE ? ? ?Patient Name: Erika Hodge ?MRN: [id number] ?DOB:[birth year], 56 y.o., female ?Today's Date: 03/26/2022 ? ?PCP: Bonnita Nasuti, MD ?REFERRING PROVIDER: Erroll Luna, MD ? ?END OF SESSION:  ? PT End of Session - 03/26/22 1358   ? ? Visit Number 12   ? Number of Visits 15   ? Date for PT Re-Evaluation 04/23/22   ? PT Start Time 1402   ? PT Stop Time 6812   ? PT Time Calculation (min) 50 min   ? Activity Tolerance Patient tolerated treatment well   ? Behavior During Therapy [facility] for tasks assessed/performed   ? ?  ?  ? ?  ? ? ? ?Past Medical History:  ?Diagnosis Date  ? Anxiety   ? Complication of anesthesia   ? HA  ? Non Hodgkin's lymphoma ([place])   ? Psoriasis   ? ?Past Surgical History:  ?Procedure Laterality Date  ? BREAST BIOPSY Left 11/20/2021  ? BREAST RECONSTRUCTION WITH PLACEMENT OF TISSUE EXPANDER AND ALLODERM Bilateral 01/07/2022  ? Procedure: BILATERAL BREAST RECONSTRUCTION WITH PLACEMENT OF TISSUE EXPANDERS AND ALLODERM;  Surgeon: Irene Limbo, MD;  Location: [place];  Service: Plastics;  Laterality: Bilateral;  ? NIPPLE SPARING MASTECTOMY Right 01/07/2022  ? Procedure: RIGHT NIPPLE SPARING MASTECTOMY;  Surgeon: Erroll Luna, MD;  Location: [place];  Service: General;  Laterality: Right;  ? NIPPLE SPARING MASTECTOMY WITH SENTINEL LYMPH NODE BIOPSY Left 01/07/2022  ? Procedure: LEFT NIPPLE SPARING MASTECTOMY WITH SENTINEL LYMPH NODE BIOPSY;  Surgeon: Erroll Luna, MD;  Location: [place];  Service: General;  Laterality: Left;  ? TUBAL LIGATION    ? ?Patient Active Problem List  ? Diagnosis Date Noted  ? Invasive lobular carcinoma of right breast, stage 1 ([place]) 01/24/2022  ? Genetic testing 12/11/2021  ? Family history of breast cancer 11/29/2021  ? Malignant neoplasm of upper-inner quadrant of left breast in female, estrogen receptor positive ([place]) 11/26/2021  ? ? ?REFERRING DIAG: s/p bilateral  Mastectomies with left SLNB ? ?THERAPY DIAG:  ?Aftercare following surgery for neoplasm ? ?Abnormal posture ? ?Malignant neoplasm of upper-inner quadrant of left breast in female, estrogen receptor positive ([place]) ? ?PERTINENT HISTORY: Patient was diagnosed on 09/10/2021 with 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 Two negative axillary nodes were removed on the left side. An incidental finding of right breast invasive lobular carcinoma was also found and is ER/PR positive and HER2 negative. It is weakly ER positive, PR negative and HER2 negative with a Ki67 of 20%. She has a left wrist plate from a previous surgery in 2006 and a history of non-Hodgkins Lymphoma in 1985. ? ?PRECAUTIONS: left lymphedema risk, hx of Non-Hodgkins Lymphoma, left wrist plate from prior surgery ? ?SUBJECTIVE:  I feel pretty good overall. I have my next infusion tomorrow.  ? ?PAIN:  ?Are you having pain? No ? ?02/03/2022 OBSERVATIONS: ?           Right breast with increased edema compared to left. Left inferior breast incision appears to be slightly macerated. Non-stick pad applied under each breast to reduce fluid leakage. Left axillary incision appears to be healing well.  ?  ?POSTURE:  ?Forward head, rounded shoulders ?  ?LYMPHEDEMA ASSESSMENT:  ?  ?A/PROM Right ?11/27/2021 Left ?11/27/2021 Right ?02/03/2022 Left  ?02/03/2022 Right 02/21/22 Left 02/21/22 Right ?03/03/2022 Left ?03/03/2022 Right ?03/26/22 Left  ?  03/26/22  ?Shoulder extension 38 48 39 32 61 54 51 58    ?Shoulder flexion 157 152 100 51 148 115 163 143  155  ?Shoulder abduction 165 163 60 58 169 120 179 133  165  ?Shoulder internal rotation 67 62 63 NT 54 55 68 64    ?Shoulder external rotation 77 77 84 NT 90 78 97 88    ?                        (Blank rows = not tested) ?  ? LYMPHEDEMA ASSESSMENTS:  ?  ?LANDMARK RIGHT ?11/27/2021 LEFT ?11/27/2021  RIGHT ?02/03/2022 LEFT ?02/03/2022  ?10 cm proximal to olecranon process 32 31.9 31.8 31.5  ?Olecranon process 27 27 27.7 26.4  ?10 cm proximal to ulnar styloid process 23.7 23 23.3 22.7  ?Just proximal to ulnar styloid process 16.4 16.8 16.1 16.7  ?Across hand at thumb web space 19 18.7 19 18.8  ?At base of 2nd digit 6.8 7.5 6.7 7.5  ?(Blank rows = not tested) ?  ?Treatment: ?03/26/22 ?Soft tissue mobilization to left UT, pectorals and lats in supine and UT and scapular area in right S/L.Lt scapular mobilization with sig tightness and decreased movement here limiting abduction in sidelying.   ?PROM to left shoulder flexion, abduction, and D2. Multiple VC's to pt to relax her arm, and significant guarding with twitching noted in lat and pectoralis ?MLD briefly: bilateral supraclavicular, 5 diaphragmatic breaths, left inguinal LN's and Left axillo-inguinal pathway directing fluid away from lateral trunk repeating pathway and ending with inguinal LN's.  ?TE:  LTR x 5 with goal post arms ? Supine dowel flexion x 8 ? Supine protraction/retraction x 5 with sig vcs and tcs needed ? Practiced scapular clock in seated which pt had difficulty isolating ? ? ?03/24/22: ?MANUAL ?Soft tissue mobilization to left UT, pectorals and lats in supine and UT and scapular area in right S/L. ?PROM to left shoulder flexion, abduction, and D2. Multiple VC's to pt to relax her arm, improved with practice ?MLD: bilateral supraclavicular, 5 diaphragmatic breaths, left inguinal LN's and Left axillo-inguinal pathway directing fluid away from lateral trunk repeating pathway and ending with inguinal LN's. Pt did not practice secondary to right hand red and blistered from "chemo hand" ? ?03/17/2022   ?Pts right hand very blistered from chemo. Took photo and messaged MD as pt wanted to know what to put on it. ?MANUAL ?Soft tissue mobilization to left UT, pectorals and lats in supine and Ut and scapular area in right SL.Marland Kitchen ?PROM to left shoulder flexion,  scaption, abduction, IR and ER. Multiple VC's to pt to relax her arm, improved with practice ?MLD bilateral supraclavicular, 5 diaphragmatic breaths, left inguinal LN's and Left axillo-inguinal pathway directing fluid away from lateral trunk repeating pathway and ending with inguinal LN's. Pt did not practice secondary to right hand red and blistered from "chemo hand" ?Lower trunk rotation bilaterally x 5 with arms outstretched and with goal post position ?           ?Surgery type/Date: 01/07/2022 ?Number of lymph nodes removed: 2 on left ?Current/past treatment (chemo, radiation, hormone therapy): Radiation to chest for Non-hodgkins lymphoma in 1985 ?Other symptoms:  ?Heaviness/tightness Yes ?Pain Yes ?Pitting edema No ?Infections No ?Decreased scar mobility Yes ?Stemmer sign No ?  ?  ?PATIENT EDUCATION:  ?Education details: Self MLD ?Person educated: Patient  ?Education method: Explanation, demonstration, handout ?Education comprehension: verbalized, demonstrated understanding, and pt returned  demonstration  ?  ?  ?HOME EXERCISE PROGRAM: ? Reviewed previously given post op HEP. Performed supine clasped hands flexion and stargazer today with improved ease ?4/14- issued supine dowel flexion and abduction , 03/05/2022 Lower trunk rotation with arms outstretched; 03/10/22: Self MLD to Lt lateral trunk, 03/26/22 scapular clock movements ?  ?ASSESSMENT: ?  ?CLINICAL IMPRESSION: ?Pt has met all ROM goals but continues with limitations with guarding the Lt UE, decreased scapular mobility and isolation, and tenderness to left upper quadrant musculature.  We extended the POC 1x per week up to 4 weeks.   ? ?Pt will benefit from skilled therapeutic intervention to improve on the following deficits: Decreased knowledge of precautions, impaired UE functional use, pain, decreased ROM, postural dysfunction.  ?  ?PT treatment/interventions: ADL/Self care home management, Therapeutic exercises, Therapeutic activity, Neuromuscular  re-education, Balance training, Gait training, Patient/Family education, and Joint mobilization ?  ?  ?  ?  ?GOALS: ?Goals reviewed with patient? Yes ?  ?LONG TERM GOALS:  (STG=LTG) ?  ?GOALS Name Target Date Goal status

## 2022-03-27 ENCOUNTER — Inpatient Hospital Stay: Payer: BC Managed Care – PPO | Attending: Hematology

## 2022-03-27 ENCOUNTER — Encounter: Payer: Self-pay | Admitting: Hematology

## 2022-03-27 ENCOUNTER — Encounter: Payer: Self-pay | Admitting: *Deleted

## 2022-03-27 ENCOUNTER — Inpatient Hospital Stay (HOSPITAL_BASED_OUTPATIENT_CLINIC_OR_DEPARTMENT_OTHER): Payer: BC Managed Care – PPO | Admitting: Hematology

## 2022-03-27 ENCOUNTER — Other Ambulatory Visit: Payer: Self-pay

## 2022-03-27 ENCOUNTER — Inpatient Hospital Stay: Payer: BC Managed Care – PPO

## 2022-03-27 VITALS — BP 155/79 | HR 95 | Temp 98.3°F | Resp 19 | Ht 63.0 in | Wt 178.6 lb

## 2022-03-27 DIAGNOSIS — C50212 Malignant neoplasm of upper-inner quadrant of left female breast: Secondary | ICD-10-CM

## 2022-03-27 DIAGNOSIS — Z5111 Encounter for antineoplastic chemotherapy: Secondary | ICD-10-CM | POA: Diagnosis present

## 2022-03-27 DIAGNOSIS — Z170 Estrogen receptor positive status [ER+]: Secondary | ICD-10-CM

## 2022-03-27 DIAGNOSIS — C50911 Malignant neoplasm of unspecified site of right female breast: Secondary | ICD-10-CM

## 2022-03-27 DIAGNOSIS — C50919 Malignant neoplasm of unspecified site of unspecified female breast: Secondary | ICD-10-CM

## 2022-03-27 LAB — CBC WITH DIFFERENTIAL (CANCER CENTER ONLY)
Abs Immature Granulocytes: 0.21 10*3/uL — ABNORMAL HIGH (ref 0.00–0.07)
Basophils Absolute: 0.1 10*3/uL (ref 0.0–0.1)
Basophils Relative: 0 %
Eosinophils Absolute: 0 10*3/uL (ref 0.0–0.5)
Eosinophils Relative: 0 %
HCT: 35.5 % — ABNORMAL LOW (ref 36.0–46.0)
Hemoglobin: 11.5 g/dL — ABNORMAL LOW (ref 12.0–15.0)
Immature Granulocytes: 1 %
Lymphocytes Relative: 7 %
Lymphs Abs: 1.3 10*3/uL (ref 0.7–4.0)
MCH: 30.2 pg (ref 26.0–34.0)
MCHC: 32.4 g/dL (ref 30.0–36.0)
MCV: 93.2 fL (ref 80.0–100.0)
Monocytes Absolute: 1.1 10*3/uL — ABNORMAL HIGH (ref 0.1–1.0)
Monocytes Relative: 6 %
Neutro Abs: 17.3 10*3/uL — ABNORMAL HIGH (ref 1.7–7.7)
Neutrophils Relative %: 86 %
Platelet Count: 380 10*3/uL (ref 150–400)
RBC: 3.81 MIL/uL — ABNORMAL LOW (ref 3.87–5.11)
RDW: 14.8 % (ref 11.5–15.5)
WBC Count: 20.1 10*3/uL — ABNORMAL HIGH (ref 4.0–10.5)
nRBC: 0 % (ref 0.0–0.2)

## 2022-03-27 LAB — CMP (CANCER CENTER ONLY)
ALT: 17 U/L (ref 0–44)
AST: 15 U/L (ref 15–41)
Albumin: 4.4 g/dL (ref 3.5–5.0)
Alkaline Phosphatase: 66 U/L (ref 38–126)
Anion gap: 8 (ref 5–15)
BUN: 15 mg/dL (ref 6–20)
CO2: 24 mmol/L (ref 22–32)
Calcium: 9.7 mg/dL (ref 8.9–10.3)
Chloride: 103 mmol/L (ref 98–111)
Creatinine: 0.73 mg/dL (ref 0.44–1.00)
GFR, Estimated: >60 mL/min (ref >60)
Glucose, Bld: 123 mg/dL — ABNORMAL HIGH (ref 70–99)
Potassium: 4.2 mmol/L (ref 3.5–5.1)
Sodium: 135 mmol/L (ref 135–145)
Total Bilirubin: 0.3 mg/dL (ref 0.3–1.2)
Total Protein: 7.7 g/dL (ref 6.5–8.1)

## 2022-03-27 MED ORDER — SODIUM CHLORIDE 0.9 % IV SOLN
600.0000 mg/m2 | Freq: Once | INTRAVENOUS | Status: AC
  Administered 2022-03-27: 1140 mg via INTRAVENOUS
  Filled 2022-03-27: qty 57

## 2022-03-27 MED ORDER — PALONOSETRON HCL INJECTION 0.25 MG/5ML
0.2500 mg | Freq: Once | INTRAVENOUS | Status: AC
  Administered 2022-03-27: 0.25 mg via INTRAVENOUS
  Filled 2022-03-27: qty 5

## 2022-03-27 MED ORDER — SODIUM CHLORIDE 0.9 % IV SOLN: Freq: Once | INTRAVENOUS | Status: AC

## 2022-03-27 MED ORDER — SODIUM CHLORIDE 0.9 % IV SOLN
10.0000 mg | Freq: Once | INTRAVENOUS | Status: AC
  Administered 2022-03-27: 10 mg via INTRAVENOUS
  Filled 2022-03-27: qty 10

## 2022-03-27 MED ORDER — SODIUM CHLORIDE 0.9 % IV SOLN
65.0000 mg/m2 | Freq: Once | INTRAVENOUS | Status: AC
  Administered 2022-03-27: 120 mg via INTRAVENOUS
  Filled 2022-03-27: qty 12

## 2022-03-27 NOTE — Progress Notes (Signed)
[place]   Telephone:([phone]) [phone] Fax:(336) [phone]   Clinic Follow up Note   Patient Care Team: Bonnita Nasuti, MD as PCP - General (Internal Medicine) Erroll Luna, MD as Consulting Physician (General Surgery) Truitt Merle, MD as Consulting Physician (Hematology) Gery Pray, MD as Consulting Physician (Radiation Oncology) Mauro Kaufmann, RN as Oncology Nurse Navigator Rockwell Germany, RN as Oncology Nurse Navigator  Date of Service:  03/27/2022  CHIEF COMPLAINT: f/u of bilateral breast cancer  CURRENT THERAPY:  Adjuvant TC, q21d, starting 02/12/22  ASSESSMENT & PLAN:  LICET Hodge is a 56 y.o. post-menopausal female with   1. 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 -she opted to proceed with b/l mastectomies with reconstruction on 01/07/22 with Dr. Brantley Stage and Dr. Iran Planas. Pathology revealed b/l breast cancers. Left breast showed 2.1 cm invasive and in situ ductal carcinoma. Margins and lymph nodes negative (0/2). Repeated ER/PR/HER2 were all negative (ER 10% weak (+) on biopsy) -she developed left IMF necrotic tissue, excised by Dr. Iran Planas on 01/27/22. -because she was likely treated with adriamycin for her lymphoma, she is not a candidate for more of this medicine. Therefore, I recommend 6 cycles of TC (docetaxel and cytoxan). She declined port placement.  -she began TC on 02/12/22. She tolerated well overall except for moderate constipation. She experienced increased side effects after C2 expecially skin rash, fatigue and diarrhea.  but upon discussion, she tells me she only took the decadron for one day after treatment. I advised her to take for 5 days to see how she does. -labs reviewed, overall stable, and as expected for being on treatment.  We will slightly decrease docetaxel dose due to her poor  tolerance.   2. Invasive lobular carcinoma of right breast, stage I pT1c, ER weakly+/PR+/Her2-, and DCIS  -incidental finding on b/l mastectomies on 01/07/22 for left breast cancer. Pathology from right breast showed: 1.1 cm invasive lobular carcinoma, grade 1, in LIQ; 1 cm DCIS, intermediate grade, in UIQ. ILC showed ER 30% weakly positive, PR 80% moderately positive, Her2 negative. ILC focally involves adjacent anterior margin.  -we will discuss antiestrogen therapy after she finishes chemo.   3. 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 -per pt, she also received three years of systemic treatment. She reports she was treated at [facility]. Her records were too old to be received, but she likely received adriamycin during that time.   4. Psoriasis  -previously on Tremfya injections, was told by dermatology that she could not restart due to h/o cancer. -I do not recommend immunotherapy because of this. -followed by Dr. Abner Greenspan with [facility], per pt     PLAN:  -proceed with C3 TC today with slight dose reduction on docetaxel, no GCSF  -She will take dexamethasone 4 mg daily for 5 days starting tomorrow, and watch her skin rashes.   -Lab, f/u, and TC in 3 and 6 weeks   No problem-specific Assessment & Plan notes found for this encounter.   SUMMARY OF ONCOLOGIC HISTORY: Oncology History Overview Note   Cancer Staging  Invasive lobular carcinoma of right breast, stage 1 ([place]) Staging form: Breast, AJCC 8th Edition - Clinical stage from 01/07/2022: Stage Unknown (cT1c, cNX, cM0, G1, ER+, PR+, HER2-) - Signed by Truitt Merle, MD on 01/24/2022  Malignant neoplasm of upper-inner quadrant of left breast in female, estrogen receptor positive ([place]) Staging form: Breast, AJCC 8th Edition - Clinical stage from 11/20/2021: Stage IA (cT1c, cN0, cM0, G2, ER+, PR-, HER2-) - Signed by Truitt Merle, MD on 11/26/2021 - Pathologic stage from  01/07/2022: Stage IIA (pT2, pN0, cM0, G2, ER-, PR-, HER2-) - Signed by Truitt Merle, MD on 01/24/2022     Malignant neoplasm of upper-inner quadrant of left breast in female, estrogen receptor positive ([place])  11/05/2021 Mammogram   CLINICAL DATA:  Screening recall for a possible left breast asymmetry.   EXAM: DIGITAL DIAGNOSTIC UNILATERAL LEFT MAMMOGRAM WITH TOMOSYNTHESIS AND CAD; ULTRASOUND LEFT BREAST LIMITED  IMPRESSION: 1. Irregular 1.2 cm mass in the left breast at 11 o'clock, 2 cm the nipple, suspicious for malignancy.   11/20/2021 Cancer Staging   Staging form: Breast, AJCC 8th Edition - Clinical stage from 11/20/2021: Stage IA (cT1c, cN0, cM0, G2, ER+, PR-, HER2-) - Signed by Truitt Merle, MD on 11/26/2021 Stage prefix: Initial diagnosis Histologic grading system: 3 grade system    11/20/2021 Initial Biopsy   Diagnosis Breast, left, needle core biopsy, 11 o'clock, ribbon clip - INVASIVE DUCTAL CARCINOMA - DUCTAL CARCINOMA IN SITU - SEE COMMENT Microscopic Comment based on the biopsy, the carcinoma appears Nottingham grade 2 of 3 and measures 0.6 cm in greatest linear extent.  PROGNOSTIC INDICATORS Results: The tumor cells are NEGATIVE for Her2 (1+). Estrogen Receptor: 10%, POSITIVE, WEAK STAINING INTENSITY Progesterone Receptor: 0%, NEGATIVE Proliferation Marker Ki67: 20%   11/26/2021 Initial Diagnosis   Malignant neoplasm of upper-inner quadrant of left breast in female, estrogen receptor positive ([place])    11/27/2021 Genetic Testing   [facility] Cancer[facility] was Negative. Report date is 12/11/2021.  The CancerNext gene panel offered by [employer] includes sequencing, rearrangement analysis, and RNA analysis for the following 36 genes:   APC, ATM, AXIN2, BARD1, BMPR1A, BRCA1, BRCA2, BRIP1, CDH1, CDK4, CDKN2A, CHEK2, DICER1, HOXB13, EPCAM, GREM1, MLH1, MSH2, MSH3, MSH6, MUTYH, NBN, NF1, NTHL1, PALB2, PMS2, POLD1, POLE, PTEN, RAD51C, RAD51D, RECQL, SMAD4, SMARCA4, STK11, and TP53.     01/07/2022 Cancer Staging   Staging form: Breast, AJCC 8th Edition - Pathologic stage from 01/07/2022: Stage IIA (pT2, pN0, cM0, G2, ER-, PR-, HER2-) - Signed by Truitt Merle, MD on 01/24/2022 Stage prefix: Initial diagnosis Histologic grading system: 3 grade system Residual tumor (R): R0 - None    01/07/2022 Definitive Surgery   FINAL MICROSCOPIC DIAGNOSIS:   A. BREAST, RIGHT NIPPLE, BIOPSY:  - Benign breast tissue.  - No malignancy identified.   B. BREAST, LEFT NIPPLE, BIOPSY:  - Benign breast tissue.  - No malignancy identified.   C. BREAST, RIGHT, MASTECTOMY:  - Invasive lobular carcinoma, 1.1 cm.  - Ductal carcinoma in situ, 1 cm.  - Invasive carcinoma focally extends to the anterior margin.  - Fibrocystic changes with usual ductal hyperplasia and calcifications.  - Sclerosing adenosis and fibroadenomatoid change.  - See oncology table and comment.   D. LYMPH NODE, LEFT AXILLARY, SENTINEL, EXCISION:  - One lymph node negative for metastatic carcinoma (0/1).   E. LYMPH NODE, LEFT AXILLARY, SENTINEL, EXCISION:  - One lymph node negative for metastatic carcinoma (0/1).   F. BREAST, LEFT, MASTECTOMY:  - Invasive and in situ ductal carcinoma, 2.1 cm.  - Margins negative for carcinoma.  - See oncology table.    01/07/2022 Receptors her2   F1. PROGNOSTIC INDICATOR RESULTS:   The tumor cells are NEGATIVE for Her2 (0).   Estrogen  Receptor: NEGATIVE  Progesterone Receptor: NEGATIVE  Proliferation Marker Ki-67: 10%    02/12/2022 -  Chemotherapy   Patient is on Treatment Plan : BREAST TC q21d      Invasive lobular carcinoma of right breast, stage 1 ([place])  11/27/2021 Genetic Testing   [facility] was Negative. Report date is 12/11/2021.  The CancerNext gene panel offered by [employer] includes sequencing, rearrangement analysis, and RNA analysis for the following 36 genes:   APC, ATM, AXIN2, BARD1, BMPR1A, BRCA1, BRCA2, BRIP1, CDH1, CDK4, CDKN2A, CHEK2, DICER1,  HOXB13, EPCAM, GREM1, MLH1, MSH2, MSH3, MSH6, MUTYH, NBN, NF1, NTHL1, PALB2, PMS2, POLD1, POLE, PTEN, RAD51C, RAD51D, RECQL, SMAD4, SMARCA4, STK11, and TP53.    01/07/2022 Cancer Staging   Staging form: Breast, AJCC 8th Edition - Clinical stage from 01/07/2022: Stage Unknown (cT1c, cNX, cM0, G1, ER+, PR+, HER2-) - Signed by Truitt Merle, MD on 01/24/2022 Stage prefix: Initial diagnosis Histologic grading system: 3 grade system    01/07/2022 Definitive Surgery   FINAL MICROSCOPIC DIAGNOSIS:   A. BREAST, RIGHT NIPPLE, BIOPSY:  - Benign breast tissue.  - No malignancy identified.   B. BREAST, LEFT NIPPLE, BIOPSY:  - Benign breast tissue.  - No malignancy identified.   C. BREAST, RIGHT, MASTECTOMY:  - Invasive lobular carcinoma, 1.1 cm.  - Ductal carcinoma in situ, 1 cm.  - Invasive carcinoma focally extends to the anterior margin.  - Fibrocystic changes with usual ductal hyperplasia and calcifications.  - Sclerosing adenosis and fibroadenomatoid change.  - See oncology table and comment.   D. LYMPH NODE, LEFT AXILLARY, SENTINEL, EXCISION:  - One lymph node negative for metastatic carcinoma (0/1).   E. LYMPH NODE, LEFT AXILLARY, SENTINEL, EXCISION:  - One lymph node negative for metastatic carcinoma (0/1).   F. BREAST, LEFT, MASTECTOMY:  - Invasive and in situ ductal carcinoma, 2.1 cm.  - Margins negative for carcinoma.  - See oncology table.    01/07/2022 Receptors her2   C7.  PROGNOSTIC INDICATOR RESULTS:   The tumor cells are NEGATIVE for Her2 (1+).   Estrogen Receptor: POSITIVE, 30% WEAK STAINING INTENSITY  Progesterone Receptor: POSITIVE, 80% MODERATE STAINING INTENSITY  Proliferation Marker Ki-67: <5%    01/24/2022 Initial Diagnosis   Invasive lobular carcinoma of right breast, stage 1 ([place])       INTERVAL HISTORY:  Erika Hodge is here for a follow up of breast cancer. She was last seen by me on 03/06/22. She presents to the clinic accompanied by her husband. She  reports second cycle was more difficult that the first cycle. Her husband wrote down all her side effects, several of which were skin related. They note she only took the steroid for one day after infusion instead of three. She also reports shortness of breath with exertion; she denies chest pain. She also reports she's been told one of her breasts is smaller than the other. She expressed concern that one of her tissue expanders is leaking.   All other systems were reviewed with the patient and are negative.  MEDICAL HISTORY:  Past Medical History:  Diagnosis Date   Anxiety    Complication of anesthesia    HA   Non Hodgkin's lymphoma ([place])    Psoriasis     SURGICAL HISTORY: Past Surgical History:  Procedure Laterality Date   BREAST BIOPSY Left 11/20/2021   BREAST RECONSTRUCTION WITH PLACEMENT OF TISSUE EXPANDER AND ALLODERM Bilateral 01/07/2022   Procedure: BILATERAL BREAST RECONSTRUCTION WITH PLACEMENT OF TISSUE EXPANDERS AND ALLODERM;  Surgeon: Irene Limbo, MD;  Location: [facility];  Service: Plastics;  Laterality: Bilateral;   NIPPLE SPARING MASTECTOMY Right 01/07/2022   Procedure: RIGHT NIPPLE SPARING MASTECTOMY;  Surgeon: Erroll Luna, MD;  Location: [facility];  Service: General;  Laterality: Right;   NIPPLE SPARING MASTECTOMY WITH SENTINEL LYMPH NODE BIOPSY Left 01/07/2022   Procedure: LEFT NIPPLE SPARING MASTECTOMY WITH SENTINEL LYMPH NODE BIOPSY;  Surgeon: Erroll Luna, MD;  Location: [facility];  Service: General;  Laterality: Left;   TUBAL LIGATION      I have reviewed the social history and family history with the patient and they are unchanged from previous note.  ALLERGIES:  is allergic to amoxicillin and codeine.  MEDICATIONS:  Current Outpatient Medications  Medication Sig Dispense Refill   ALPRAZolam (XANAX) 0.5 MG tablet Take 0.5 mg by mouth daily as needed.     Apple Cider Vinegar 300 MG TABS Take 1 tablet  by mouth daily.     ASHWAGANDHA PO Take 1 tablet by mouth daily.     Calcipotriene-Betameth Diprop (ENSTILAR) 0.005-0.064 % FOAM Apply 1 application topically as needed (apply to psoriasis area as needed).     dexamethasone (DECADRON) 4 MG tablet Take 1 tablet (4 mg total) by mouth 2 (two) times daily. Start the day before Taxotere. Then change to one tablet daily after chemo for 3 days. 30 tablet 1   FLUoxetine (PROZAC) 20 MG capsule Take 20 mg by mouth as needed.     loratadine (CLARITIN) 10 MG tablet Take 10 mg by mouth daily.     omeprazole (PRILOSEC) 40 MG capsule Take 40 mg by mouth daily.     ondansetron (ZOFRAN) 8 MG tablet Take 1 tablet (8 mg total) by mouth 2 (two) times daily as needed for refractory nausea / vomiting. Start on day 3 after chemo. 30 tablet 1   Probiotic Product (DIGESTIVE ADV MULTI-STRAIN PO) Take 1 tablet by mouth daily.     prochlorperazine (COMPAZINE) 10 MG tablet Take 1 tablet (10 mg total) by mouth every 6 (six) hours as needed (Nausea or vomiting). 30 tablet 1   zolpidem (AMBIEN) 5 MG tablet Take 5 mg by mouth at bedtime as needed for sleep.     No current facility-administered medications for this visit.    PHYSICAL EXAMINATION: ECOG PERFORMANCE STATUS: 1 - Symptomatic but completely ambulatory  Vitals:   03/27/22 1137  BP: (!) 155/79  Pulse: 95  Resp: 19  Temp: 98.3 F (36.8 C)  SpO2: 99%   Wt Readings from Last 3 Encounters:  03/27/22 178 lb 9.6 oz (81 kg)  03/06/22 175 lb 9 oz (79.6 kg)  02/20/22 173 lb (78.5 kg)     GENERAL:alert, no distress and comfortable SKIN: skin color normal, no rashes or significant lesions EYES: normal, Conjunctiva are pink and non-injected, sclera clear  NEURO: alert & oriented x 3 with fluent speech  LABORATORY DATA:  I have reviewed the data as listed    Latest Ref Rng & Units 03/27/2022   11:18 AM 03/06/2022   11:52 AM 02/20/2022   11:44 AM  CBC  WBC 4.0 - 10.5 K/uL 20.1   9.9   38.2    Hemoglobin 12.0 -  15.0 g/dL 11.5   11.8   12.7    Hematocrit 36.0 - 46.0 % 35.5   36.8   39.4    Platelets 150 - 400 K/uL 380   685   260  Latest Ref Rng & Units 03/27/2022   11:18 AM 03/06/2022   11:52 AM 02/20/2022   11:44 AM  CMP  Glucose 70 - 99 mg/dL 123   99   147    BUN 6 - 20 mg/dL 15   10   9     Creatinine 0.44 - 1.00 mg/dL 0.73   0.72   0.75    Sodium 135 - 145 mmol/L 135   136   137    Potassium 3.5 - 5.1 mmol/L 4.2   4.0   4.0    Chloride 98 - 111 mmol/L 103   105   103    CO2 22 - 32 mmol/L 24   24   25     Calcium 8.9 - 10.3 mg/dL 9.7   9.3   9.7    Total Protein 6.5 - 8.1 g/dL 7.7   7.1   7.3    Total Bilirubin 0.3 - 1.2 mg/dL 0.3   0.3   0.2    Alkaline Phos 38 - 126 U/L 66   70   129    AST 15 - 41 U/L 15   31   33    ALT 0 - 44 U/L 17   26   26         RADIOGRAPHIC STUDIES: I have personally reviewed the radiological images as listed and agreed with the findings in the report. No results found.    No orders of the defined types were placed in this encounter.  All questions were answered. The patient knows to call the clinic with any problems, questions or concerns. No barriers to learning was detected. The total time spent in the appointment was 30 minutes.     Truitt Merle, MD 03/27/2022   I, Wilburn Mylar, am acting as scribe for Truitt Merle, MD.   I have reviewed the above documentation for accuracy and completeness, and I agree with the above.

## 2022-03-27 NOTE — Patient Instructions (Signed)
[facility]  Discharge Instructions: Thank you for choosing [facility] to provide your oncology and hematology care.   If you have a lab appointment with the [facility], please go directly to the [facility] and check in at the registration area.   Wear comfortable clothing and clothing appropriate for easy access to any Portacath or PICC line.   We strive to give you quality time with your provider. You may need to reschedule your appointment if you arrive late (15 or more minutes).  Arriving late affects you and other patients whose appointments are after yours.  Also, if you miss three or more appointments without notifying the office, you may be dismissed from the clinic at the provider's discretion.      For prescription refill requests, have your pharmacy contact our office and allow 72 hours for refills to be completed.    Today you received the following chemotherapy and/or immunotherapy agents: Docetaxel, Carboplatin      To help prevent nausea and vomiting after your treatment, we encourage you to take your nausea medication as directed.  BELOW ARE SYMPTOMS THAT SHOULD BE REPORTED IMMEDIATELY: *FEVER GREATER THAN 100.4 F (38 C) OR HIGHER *CHILLS OR SWEATING *NAUSEA AND VOMITING THAT IS NOT CONTROLLED WITH YOUR NAUSEA MEDICATION *UNUSUAL SHORTNESS OF BREATH *UNUSUAL BRUISING OR BLEEDING *URINARY PROBLEMS (pain or burning when urinating, or frequent urination) *BOWEL PROBLEMS (unusual diarrhea, constipation, pain near the anus) TENDERNESS IN MOUTH AND THROAT WITH OR WITHOUT PRESENCE OF ULCERS (sore throat, sores in mouth, or a toothache) UNUSUAL RASH, SWELLING OR PAIN  UNUSUAL VAGINAL DISCHARGE OR ITCHING   Items with * indicate a potential emergency and should be followed up as soon as possible or go to the Emergency Department if any problems should occur.  Please show the CHEMOTHERAPY ALERT CARD or IMMUNOTHERAPY ALERT CARD at  check-in to the Emergency Department and triage nurse.  Should you have questions after your visit or need to cancel or reschedule your appointment, please contact [facility]  Dept: [phone]  and follow the prompts.  Office hours are 8:00 a.m. to 4:30 p.m. Monday - Friday. Please note that voicemails left after 4:00 p.m. may not be returned until the following business day.  We are closed weekends and major holidays. You have access to a nurse at all times for urgent questions. Please call the main number to the clinic Dept: [phone] and follow the prompts.   For any non-urgent questions, you may also contact your provider using MyChart. We now offer e-Visits for anyone 35 and older to request care online for non-urgent symptoms. For details visit [email or web address].   Also download the MyChart app! Go to the app store, search "MyChart", open the app, select [facility], and log in with your MyChart username and password.  Due to Covid, a mask is required upon entering the hospital/clinic. If you do not have a mask, one will be given to you upon arrival. For doctor visits, patients may have 1 support person aged 81 or older with them. For treatment visits, patients cannot have anyone with them due to current Covid guidelines and our immunocompromised population.

## 2022-03-27 NOTE — Patient Instructions (Signed)
[facility]  Discharge Instructions: Thank you for choosing [facility] to provide your oncology and hematology care.   If you have a lab appointment with the Cancer Center, please go directly to the Cancer Center and check in at the registration area.   Wear comfortable clothing and clothing appropriate for easy access to any Portacath or PICC line.   We strive to give you quality time with your provider. You may need to reschedule your appointment if you arrive late (15 or more minutes).  Arriving late affects you and other patients whose appointments are after yours.  Also, if you miss three or more appointments without notifying the office, you may be dismissed from the clinic at the provider's discretion.      For prescription refill requests, have your pharmacy contact our office and allow 72 hours for refills to be completed.    Today you received the following chemotherapy and/or immunotherapy agents : Taxotere, Cytoxan     To help prevent nausea and vomiting after your treatment, we encourage you to take your nausea medication as directed.  BELOW ARE SYMPTOMS THAT SHOULD BE REPORTED IMMEDIATELY: *FEVER GREATER THAN 100.4 F (38 C) OR HIGHER *CHILLS OR SWEATING *NAUSEA AND VOMITING THAT IS NOT CONTROLLED WITH YOUR NAUSEA MEDICATION *UNUSUAL SHORTNESS OF BREATH *UNUSUAL BRUISING OR BLEEDING *URINARY PROBLEMS (pain or burning when urinating, or frequent urination) *BOWEL PROBLEMS (unusual diarrhea, constipation, pain near the anus) TENDERNESS IN MOUTH AND THROAT WITH OR WITHOUT PRESENCE OF ULCERS (sore throat, sores in mouth, or a toothache) UNUSUAL RASH, SWELLING OR PAIN  UNUSUAL VAGINAL DISCHARGE OR ITCHING   Items with * indicate a potential emergency and should be followed up as soon as possible or go to the Emergency Department if any problems should occur.  Please show the CHEMOTHERAPY ALERT CARD or IMMUNOTHERAPY ALERT CARD at  check-in to the Emergency Department and triage nurse.  Should you have questions after your visit or need to cancel or reschedule your appointment, please contact [facility]  Dept: [phone]  and follow the prompts.  Office hours are 8:00 a.m. to 4:30 p.m. Monday - Friday. Please note that voicemails left after 4:00 p.m. may not be returned until the following business day.  We are closed weekends and major holidays. You have access to a nurse at all times for urgent questions. Please call the main number to the clinic Dept: [phone] and follow the prompts.   For any non-urgent questions, you may also contact your provider using MyChart. We now offer e-Visits for anyone 18 and older to request care online for non-urgent symptoms. For details visit [email or web address].   Also download the MyChart app! Go to the app store, search "MyChart", open the app, select , and log in with your MyChart username and password.  Due to Covid, a mask is required upon entering the hospital/clinic. If you do not have a mask, one will be given to you upon arrival. For doctor visits, patients may have 1 support person aged 18 or older with them. For treatment visits, patients cannot have anyone with them due to current Covid guidelines and our immunocompromised population.   

## 2022-03-28 ENCOUNTER — Telehealth: Payer: Self-pay | Admitting: Hematology

## 2022-03-28 NOTE — Telephone Encounter (Signed)
Scheduled follow-up appointment per 5/18 los. Patient is aware. 

## 2022-04-07 ENCOUNTER — Emergency Department (HOSPITAL_COMMUNITY): Payer: BC Managed Care – PPO

## 2022-04-07 ENCOUNTER — Inpatient Hospital Stay (HOSPITAL_COMMUNITY)
Admission: EM | Admit: 2022-04-07 | Discharge: 2022-04-09 | DRG: 809 | Disposition: A | Discharge status: Home / Self Care | Payer: BC Managed Care – PPO | Attending: Internal Medicine | Admitting: Internal Medicine

## 2022-04-07 ENCOUNTER — Encounter (HOSPITAL_COMMUNITY): Payer: Self-pay

## 2022-04-07 DIAGNOSIS — F419 Anxiety disorder, unspecified: Secondary | ICD-10-CM | POA: Diagnosis present

## 2022-04-07 DIAGNOSIS — R918 Other nonspecific abnormal finding of lung field: Secondary | ICD-10-CM

## 2022-04-07 DIAGNOSIS — C50212 Malignant neoplasm of upper-inner quadrant of left female breast: Secondary | ICD-10-CM | POA: Diagnosis present

## 2022-04-07 DIAGNOSIS — Y842 Radiological procedure and radiotherapy as the cause of abnormal reaction of the patient, or of later complication, without mention of misadventure at the time of the procedure: Secondary | ICD-10-CM | POA: Diagnosis present

## 2022-04-07 DIAGNOSIS — R5081 Fever presenting with conditions classified elsewhere: Secondary | ICD-10-CM | POA: Diagnosis present

## 2022-04-07 DIAGNOSIS — Z170 Estrogen receptor positive status [ER+]: Secondary | ICD-10-CM

## 2022-04-07 DIAGNOSIS — Z6832 Body mass index (BMI) 32.0-32.9, adult: Secondary | ICD-10-CM

## 2022-04-07 DIAGNOSIS — K449 Diaphragmatic hernia without obstruction or gangrene: Secondary | ICD-10-CM | POA: Diagnosis present

## 2022-04-07 DIAGNOSIS — Z66 Do not resuscitate: Secondary | ICD-10-CM | POA: Diagnosis present

## 2022-04-07 DIAGNOSIS — J479 Bronchiectasis, uncomplicated: Secondary | ICD-10-CM

## 2022-04-07 DIAGNOSIS — Z20822 Contact with and (suspected) exposure to covid-19: Secondary | ICD-10-CM | POA: Diagnosis present

## 2022-04-07 DIAGNOSIS — D701 Agranulocytosis secondary to cancer chemotherapy: Principal | ICD-10-CM | POA: Diagnosis present

## 2022-04-07 DIAGNOSIS — F32A Depression, unspecified: Secondary | ICD-10-CM | POA: Diagnosis present

## 2022-04-07 DIAGNOSIS — T451X5A Adverse effect of antineoplastic and immunosuppressive drugs, initial encounter: Secondary | ICD-10-CM | POA: Diagnosis present

## 2022-04-07 DIAGNOSIS — D6481 Anemia due to antineoplastic chemotherapy: Secondary | ICD-10-CM | POA: Diagnosis present

## 2022-04-07 DIAGNOSIS — A419 Sepsis, unspecified organism: Principal | ICD-10-CM

## 2022-04-07 DIAGNOSIS — D638 Anemia in other chronic diseases classified elsewhere: Secondary | ICD-10-CM | POA: Diagnosis present

## 2022-04-07 DIAGNOSIS — J029 Acute pharyngitis, unspecified: Secondary | ICD-10-CM

## 2022-04-07 DIAGNOSIS — D709 Neutropenia, unspecified: Secondary | ICD-10-CM | POA: Diagnosis present

## 2022-04-07 DIAGNOSIS — E876 Hypokalemia: Secondary | ICD-10-CM | POA: Diagnosis present

## 2022-04-07 DIAGNOSIS — J471 Bronchiectasis with (acute) exacerbation: Secondary | ICD-10-CM | POA: Diagnosis not present

## 2022-04-07 DIAGNOSIS — R0600 Dyspnea, unspecified: Secondary | ICD-10-CM

## 2022-04-07 DIAGNOSIS — Z87891 Personal history of nicotine dependence: Secondary | ICD-10-CM

## 2022-04-07 DIAGNOSIS — K219 Gastro-esophageal reflux disease without esophagitis: Secondary | ICD-10-CM

## 2022-04-07 DIAGNOSIS — Z8572 Personal history of non-Hodgkin lymphomas: Secondary | ICD-10-CM

## 2022-04-07 DIAGNOSIS — E669 Obesity, unspecified: Secondary | ICD-10-CM | POA: Diagnosis present

## 2022-04-07 DIAGNOSIS — Z9013 Acquired absence of bilateral breasts and nipples: Secondary | ICD-10-CM

## 2022-04-07 DIAGNOSIS — G2581 Restless legs syndrome: Secondary | ICD-10-CM | POA: Diagnosis present

## 2022-04-07 DIAGNOSIS — J701 Chronic and other pulmonary manifestations due to radiation: Secondary | ICD-10-CM | POA: Diagnosis present

## 2022-04-07 HISTORY — DX: Other nonspecific abnormal finding of lung field: R91.8

## 2022-04-07 HISTORY — DX: Fever presenting with conditions classified elsewhere: D70.9

## 2022-04-07 HISTORY — DX: Bronchiectasis, uncomplicated: J47.9

## 2022-04-07 LAB — CBC WITH DIFFERENTIAL/PLATELET
Abs Immature Granulocytes: 0 10*3/uL (ref 0.00–0.07)
Basophils Absolute: 0 10*3/uL (ref 0.0–0.1)
Basophils Relative: 0 %
Eosinophils Absolute: 0 10*3/uL (ref 0.0–0.5)
Eosinophils Relative: 0 %
HCT: 35.4 % — ABNORMAL LOW (ref 36.0–46.0)
Hemoglobin: 11.2 g/dL — ABNORMAL LOW (ref 12.0–15.0)
Lymphocytes Relative: 56 %
Lymphs Abs: 2.1 10*3/uL (ref 0.7–4.0)
MCH: 30.4 pg (ref 26.0–34.0)
MCHC: 31.6 g/dL (ref 30.0–36.0)
MCV: 95.9 fL (ref 80.0–100.0)
Monocytes Absolute: 1.3 10*3/uL — ABNORMAL HIGH (ref 0.1–1.0)
Monocytes Relative: 34 %
Neutro Abs: 0.4 10*3/uL — CL (ref 1.7–7.7)
Neutrophils Relative %: 10 %
Platelets: 395 10*3/uL (ref 150–400)
RBC: 3.69 MIL/uL — ABNORMAL LOW (ref 3.87–5.11)
RDW: 15.5 % (ref 11.5–15.5)
WBC: 3.7 10*3/uL — ABNORMAL LOW (ref 4.0–10.5)
nRBC: 1.1 % — ABNORMAL HIGH (ref 0.0–0.2)

## 2022-04-07 LAB — URINALYSIS, ROUTINE W REFLEX MICROSCOPIC
Bilirubin Urine: NEGATIVE
Glucose, UA: NEGATIVE mg/dL
Hgb urine dipstick: NEGATIVE
Ketones, ur: NEGATIVE mg/dL
Leukocytes,Ua: NEGATIVE
Nitrite: NEGATIVE
Protein, ur: NEGATIVE mg/dL
Specific Gravity, Urine: 1.004 — ABNORMAL LOW (ref 1.005–1.030)
pH: 7 (ref 5.0–8.0)

## 2022-04-07 LAB — BASIC METABOLIC PANEL
Anion gap: 7 (ref 5–15)
BUN: 7 mg/dL (ref 6–20)
CO2: 27 mmol/L (ref 22–32)
Calcium: 9 mg/dL (ref 8.9–10.3)
Chloride: 102 mmol/L (ref 98–111)
Creatinine, Ser: 0.65 mg/dL (ref 0.44–1.00)
GFR, Estimated: >60 mL/min (ref >60)
Glucose, Bld: 99 mg/dL (ref 70–99)
Potassium: 4.1 mmol/L (ref 3.5–5.1)
Sodium: 136 mmol/L (ref 135–145)

## 2022-04-07 LAB — LACTIC ACID, PLASMA
Lactic Acid, Venous: 1.1 mmol/L (ref 0.5–1.9)
Lactic Acid, Venous: 0.8 mmol/L (ref 0.5–1.9)

## 2022-04-07 LAB — RESP PANEL BY RT-PCR (FLU A&B, COVID) ARPGX2
Influenza A by PCR: NEGATIVE
Influenza B by PCR: NEGATIVE
SARS Coronavirus 2 by RT PCR: NEGATIVE

## 2022-04-07 LAB — TROPONIN I (HIGH SENSITIVITY)
Troponin I (High Sensitivity): 15 ng/L (ref <18)
Troponin I (High Sensitivity): 4 ng/L (ref <18)

## 2022-04-07 LAB — BRAIN NATRIURETIC PEPTIDE: B Natriuretic Peptide: 38.6 pg/mL (ref 0.0–100.0)

## 2022-04-07 LAB — D-DIMER, QUANTITATIVE: D-Dimer, Quant: 0.92 ug/mL-FEU — ABNORMAL HIGH (ref 0.00–0.50)

## 2022-04-07 MED ORDER — ZOLPIDEM TARTRATE 5 MG PO TABS
5.0000 mg | ORAL_TABLET | Freq: Every evening | ORAL | Status: DC | PRN
Start: 2022-04-07 — End: 2022-04-09
  Administered 2022-04-07 – 2022-04-08 (×2): 5 mg via ORAL
  Filled 2022-04-07 (×2): qty 1

## 2022-04-07 MED ORDER — DIPHENHYDRAMINE HCL 50 MG/ML IJ SOLN
25.0000 mg | Freq: Once | INTRAMUSCULAR | Status: AC
  Administered 2022-04-07: 25 mg via INTRAVENOUS
  Filled 2022-04-07: qty 1

## 2022-04-07 MED ORDER — PANTOPRAZOLE SODIUM 40 MG IV SOLR
40.0000 mg | Freq: Two times a day (BID) | INTRAVENOUS | Status: DC
  Administered 2022-04-07: 40 mg via INTRAVENOUS
  Filled 2022-04-07: qty 10

## 2022-04-07 MED ORDER — SODIUM CHLORIDE 3 % IN NEBU
4.0000 mL | INHALATION_SOLUTION | Freq: Two times a day (BID) | RESPIRATORY_TRACT | Status: DC
Start: 2022-04-07 — End: 2022-04-09
  Administered 2022-04-07 – 2022-04-09 (×4): 4 mL via RESPIRATORY_TRACT
  Filled 2022-04-07 (×6): qty 4

## 2022-04-07 MED ORDER — SENNOSIDES-DOCUSATE SODIUM 8.6-50 MG PO TABS
1.0000 | ORAL_TABLET | Freq: Every evening | ORAL | Status: DC | PRN
  Administered 2022-04-07: 1 via ORAL
  Filled 2022-04-07: qty 1

## 2022-04-07 MED ORDER — ACETAMINOPHEN 650 MG RE SUPP: 650.0000 mg | Freq: Four times a day (QID) | RECTAL | Status: DC | PRN

## 2022-04-07 MED ORDER — SODIUM CHLORIDE (PF) 0.9 % IJ SOLN
INTRAMUSCULAR | Status: AC
  Filled 2022-04-07: qty 50

## 2022-04-07 MED ORDER — ONDANSETRON HCL 4 MG PO TABS: 4.0000 mg | ORAL_TABLET | Freq: Four times a day (QID) | ORAL | Status: DC | PRN

## 2022-04-07 MED ORDER — METOCLOPRAMIDE HCL 5 MG/ML IJ SOLN
5.0000 mg | Freq: Once | INTRAMUSCULAR | Status: AC
  Administered 2022-04-07: 5 mg via INTRAVENOUS
  Filled 2022-04-07: qty 2

## 2022-04-07 MED ORDER — GUAIFENESIN ER 600 MG PO TB12
600.0000 mg | ORAL_TABLET | Freq: Two times a day (BID) | ORAL | Status: DC
Start: 2022-04-07 — End: 2022-04-09
  Administered 2022-04-07 – 2022-04-09 (×4): 600 mg via ORAL
  Filled 2022-04-07 (×4): qty 1

## 2022-04-07 MED ORDER — LIDOCAINE VISCOUS HCL 2 % MT SOLN
15.0000 mL | Freq: Once | OROMUCOSAL | Status: AC
  Administered 2022-04-07: 15 mL via OROMUCOSAL
  Filled 2022-04-07: qty 15

## 2022-04-07 MED ORDER — FLUOXETINE HCL 20 MG PO CAPS
20.0000 mg | ORAL_CAPSULE | Freq: Every day | ORAL | Status: DC
  Administered 2022-04-07 – 2022-04-09 (×3): 20 mg via ORAL
  Filled 2022-04-07 (×3): qty 1

## 2022-04-07 MED ORDER — SODIUM CHLORIDE 0.9 % IV SOLN: INTRAVENOUS | Status: AC

## 2022-04-07 MED ORDER — CEFEPIME HCL 2 G IV SOLR
2.0000 g | Freq: Once | INTRAVENOUS | Status: AC
  Administered 2022-04-07: 2 g via INTRAVENOUS
  Filled 2022-04-07: qty 12.5

## 2022-04-07 MED ORDER — SODIUM CHLORIDE 0.9 % IN NEBU: 3.0000 mL | INHALATION_SOLUTION | Freq: Three times a day (TID) | RESPIRATORY_TRACT | Status: DC | PRN

## 2022-04-07 MED ORDER — ONDANSETRON HCL 4 MG/2ML IJ SOLN: 4.0000 mg | Freq: Four times a day (QID) | INTRAMUSCULAR | Status: DC | PRN

## 2022-04-07 MED ORDER — SODIUM CHLORIDE 0.9% FLUSH
3.0000 mL | Freq: Two times a day (BID) | INTRAVENOUS | Status: DC
Start: 2022-04-07 — End: 2022-04-09
  Administered 2022-04-07 – 2022-04-09 (×3): 3 mL via INTRAVENOUS

## 2022-04-07 MED ORDER — ACETAMINOPHEN 325 MG PO TABS
650.0000 mg | ORAL_TABLET | Freq: Four times a day (QID) | ORAL | Status: DC | PRN
  Administered 2022-04-07 – 2022-04-08 (×2): 650 mg via ORAL
  Filled 2022-04-07 (×2): qty 2

## 2022-04-07 MED ORDER — SODIUM CHLORIDE 0.9 % IV SOLN
2.0000 g | Freq: Three times a day (TID) | INTRAVENOUS | Status: DC
  Administered 2022-04-08 – 2022-04-09 (×5): 2 g via INTRAVENOUS
  Filled 2022-04-07 (×6): qty 12.5

## 2022-04-07 MED ORDER — PHENOL 1.4 % MT LIQD
1.0000 | OROMUCOSAL | Status: DC | PRN
  Filled 2022-04-07: qty 177

## 2022-04-07 MED ORDER — ALPRAZOLAM 0.5 MG PO TABS
0.5000 mg | ORAL_TABLET | Freq: Every day | ORAL | Status: DC | PRN
  Administered 2022-04-08: 0.5 mg via ORAL
  Filled 2022-04-07: qty 1

## 2022-04-07 MED ORDER — ENOXAPARIN SODIUM 40 MG/0.4ML IJ SOSY
40.0000 mg | PREFILLED_SYRINGE | INTRAMUSCULAR | Status: DC
Start: 2022-04-07 — End: 2022-04-09
  Administered 2022-04-07 – 2022-04-08 (×2): 40 mg via SUBCUTANEOUS
  Filled 2022-04-07 (×2): qty 0.4

## 2022-04-07 MED ORDER — SODIUM CHLORIDE 0.9 % IV BOLUS
1000.0000 mL | Freq: Once | INTRAVENOUS | Status: AC
Start: 2022-04-07 — End: 2022-04-07
  Administered 2022-04-07: 1000 mL via INTRAVENOUS

## 2022-04-07 MED ORDER — IOHEXOL 350 MG/ML SOLN
75.0000 mL | Freq: Once | INTRAVENOUS | Status: AC | PRN
  Administered 2022-04-07: 75 mL via INTRAVENOUS

## 2022-04-07 NOTE — Assessment & Plan Note (Signed)
3 small lung nodules seen on CTA chest.  No suspicious in the left lower lobe with diameter of 6.5 mm.  Follow-up chest CT with contrast in 3 months is recommended.  Per radiology read, nodule probably too small to accurately characterize with PET CT at this time.  Findings discussed with patient and husband on admission.

## 2022-04-07 NOTE — H&P (Signed)
History and Physical    Erika Hodge KZL:[id number] DOB: 11/04/1966 DOA: 04/07/2022  PCP: Bonnita Nasuti, MD  Patient coming from: Home  I have personally briefly reviewed patient's old medical records in [place]  Chief Complaint: Dyspnea, sore throat  HPI: Erika Hodge is a 56 y.o. female with medical history significant for bilateral breast cancer s/p b/l mastectomy and reconstruction (01/07/2022) on active therapy with docetaxel/Cytoxan (last infusion 03/27/2022), non-Hodgkin's lymphoma age 89, psoriasis, depression/anxiety who presented to the ED for evaluation of dyspnea and sore throat.  Patient states that since the last chemotherapy treatment she has been having progressive fatigue/lethargy.  This has been significantly worsening the last few days.  She has been having exertional dyspnea.  She has had productive cough, mostly white sputum but occasionally spots of blood.  She has had significant burning sensation in her throat.  She has had decreased appetite but has been able to drink soup/broth last few days.  She has had nausea with one episode of emesis.  She denies any chest pain, abdominal pain, dysuria, or diarrhea.  She reports recent constipation.  She has not seen any changes to her skin, rash, wounds.  ED Course  Labs/Imaging on admission: I have personally reviewed following labs and imaging studies.  Initial vitals showed BP 140/78, pulse 112, RR 20, temp 99.6 F, SPO2 99% on room air.  Labs show WBC 3.7 (ANC 400), hemoglobin 11.2, platelets 395,000, sodium 136, potassium 4.1, bicarb 27, BUN 7, creatinine 0.65, serum glucose 99, BNP 38.6, troponin 15 > 4.  Blood cultures in process.  Flu/COVID respiratory panel in process.  Portable chest x-ray negative for focal consolidation, edema, effusion.  CTA chest PE study negative for evidence of PE.  3 small lung nodules (left lower lobe, medial lingula, right lower lobe) seen, no suspicious in the left lower  lobe with a mean diameter of 6.5 mm.  Metastatic disease is a consideration, follow-up chest CT with contrast in 3 months was recommended.  Mild cylindrical bronchiectasis in the medial aspects of both upper lobes seen, felt to represent postradiation changes.  Small hiatal hernia with evidence of gastroesophageal reflux with fluid in the mid and distal esophagus noted.  Patient was given 1 L normal saline, IV Reglan, IV Benadryl, IV cefepime.  The hospitalist service was consulted to admit for further evaluation and management.  Review of Systems: All systems reviewed and are negative except as documented in history of present illness above.   Past Medical History:  Diagnosis Date   Anxiety    Complication of anesthesia    HA   Non Hodgkin's lymphoma ([place])    Psoriasis     Past Surgical History:  Procedure Laterality Date   BREAST BIOPSY Left 11/20/2021   BREAST RECONSTRUCTION WITH PLACEMENT OF TISSUE EXPANDER AND ALLODERM Bilateral 01/07/2022   Procedure: BILATERAL BREAST RECONSTRUCTION WITH PLACEMENT OF TISSUE EXPANDERS AND ALLODERM;  Surgeon: Irene Limbo, MD;  Location: [facility];  Service: Plastics;  Laterality: Bilateral;   NIPPLE SPARING MASTECTOMY Right 01/07/2022   Procedure: RIGHT NIPPLE SPARING MASTECTOMY;  Surgeon: Erroll Luna, MD;  Location: [facility];  Service: General;  Laterality: Right;   NIPPLE SPARING MASTECTOMY WITH SENTINEL LYMPH NODE BIOPSY Left 01/07/2022   Procedure: LEFT NIPPLE SPARING MASTECTOMY WITH SENTINEL LYMPH NODE BIOPSY;  Surgeon: Erroll Luna, MD;  Location: [facility];  Service: General;  Laterality: Left;   TUBAL LIGATION      Social  History:  reports that she quit smoking about 7 years ago. Her smoking use included cigarettes. She has a 7.50 pack-year smoking history. She has never been exposed to tobacco smoke. She has never used smokeless tobacco. She reports current alcohol use. She reports  that she does not use drugs.  Allergies  Allergen Reactions   Amoxicillin Rash and Other (See Comments)    Throat closing   Ibuprofen Other (See Comments), Nausea And Vomiting and Nausea Only   Ciprofloxacin Other (See Comments)   Codeine Nausea Only    Family History  Problem Relation Age of Onset   Breast cancer Mother        dx. 64s, metastasized   Throat cancer Father        dx. 87s   Cancer Maternal Uncle        unknown type     Prior to Admission medications   Medication Sig Start Date End Date Taking? Authorizing Provider  acetaminophen (TYLENOL) 500 MG tablet Take 1,000 mg by mouth every 6 (six) hours as needed for mild pain or fever.   Yes [provider]  ALPRAZolam Duanne Moron) 0.5 MG tablet Take 0.5 mg by mouth daily as needed for anxiety or sleep. 01/02/22  Yes [provider]  ASHWAGANDHA PO Take 1 tablet by mouth daily.   Yes [provider]  Calcipotriene-Betameth Diprop (ENSTILAR) 0.005-0.064 % FOAM Apply 1 application topically as needed (apply to psoriasis area as needed).   Yes [provider]  dexamethasone (DECADRON) 4 MG tablet Take 1 tablet (4 mg total) by mouth 2 (two) times daily. Start the day before Taxotere. Then change to one tablet daily after chemo for 3 days. 02/04/22  Yes Truitt Merle, MD  FLUoxetine (PROZAC) 20 MG capsule Take 20 mg by mouth daily.   Yes [provider]  loratadine (CLARITIN) 10 MG tablet Take 10 mg by mouth daily.   Yes [provider]  omeprazole (PRILOSEC) 40 MG capsule Take 40 mg by mouth daily.   Yes [provider]  ondansetron (ZOFRAN) 8 MG tablet Take 1 tablet (8 mg total) by mouth 2 (two) times daily as needed for refractory nausea / vomiting. Start on day 3 after chemo. 02/04/22  Yes Truitt Merle, MD  prochlorperazine (COMPAZINE) 10 MG tablet Take 1 tablet (10 mg total) by mouth every 6 (six) hours as needed (Nausea or vomiting). 02/04/22  Yes Truitt Merle, MD  zolpidem  (AMBIEN) 10 MG tablet Take 10 mg by mouth at bedtime as needed for sleep. 03/17/22  Yes [provider]    Physical Exam: Vitals:   04/07/22 1430 04/07/22 1530 04/07/22 1725 04/07/22 1800  BP: (!) 153/86 (!) 147/79 132/79 (!) 141/78  Pulse: (!) 118 (!) 124 (!) 117 (!) 115  Resp: (!) 30 (!) 25 (!) 22 (!) 28  Temp:      TempSrc:      SpO2: 99% 98% 100% 97%   Constitutional: Resting in bed, NAD, calm, comfortable Eyes: EOMI, lids and conjunctivae normal ENMT: Mucous membranes are moist. Posterior pharynx clear of any exudate or lesions.Normal dentition.  Neck: normal, supple, no masses. Respiratory: clear to auscultation bilaterally, no wheezing, no crackles. Normal respiratory effort. No accessory muscle use.  Cardiovascular: Tachycardic, no murmurs / rubs / gallops. No extremity edema. 2+ pedal pulses. Abdomen: no tenderness, no masses palpated. No hepatosplenomegaly.  Musculoskeletal: no clubbing / cyanosis. No joint deformity upper and lower extremities. Good ROM, no contractures. Normal muscle tone.  Skin:  no rashes, lesions, ulcers. No induration Neurologic: Sensation intact. Strength 5/5 in all 4.  Psychiatric: Normal judgment and insight. Alert and oriented x 3.   EKG: Personally reviewed. Sinus tachycardia, rate 113, no acute ischemic changes.  No prior for comparison.  Assessment/Plan Principal Problem:   Bronchiectasis with acute exacerbation (HCC) Active Problems:   Neutropenia (HCC)   Malignant neoplasm of upper-inner quadrant of left breast in female, estrogen receptor positive ([place])   Pulmonary nodules   Hiatal hernia with GERD   Anxiety   Erika Hodge is a 56 y.o. female with medical history significant for bilateral breast cancer s/p b/l mastectomy and reconstruction (01/07/2022) on active therapy with docetaxel/Cytoxan (last infusion 03/27/2022), non-Hodgkin's lymphoma age 89, psoriasis, depression/anxiety who is admitted with bronchiectasis exacerbation,  neutropenia, and deconditioning.  Assessment and Plan: * Bronchiectasis with acute exacerbation (HCC) Bronchiectasis changes of both upper lobes seen on CT imaging, felt to represent postradiation changes.  Patient does have worsening dyspnea on exertion, increased cough productive of white sputum with occasional spotty blood.  CTA negative for evidence of PE or focal consolidation. -Started on empiric IV cefepime given neutropenia, continue for now -Started on hypertonic saline nebulizers -Incentive spirometer, flutter valve, mucolytics -Obtain sputum culture  Neutropenia (HCC) ANC 400.  Likely secondary to chemotherapy.  Reports diaphoresis and chills but no documented fever at time of admission. -On empiric IV cefepime as above -Follow blood cultures -COVID and influenza negative  Pulmonary nodules 3 small lung nodules seen on CTA chest.  No suspicious in the left lower lobe with diameter of 6.5 mm.  Follow-up chest CT with contrast in 3 months is recommended.  Per radiology read, nodule probably too small to accurately characterize with PET CT at this time.  Findings discussed with patient and husband on admission.  Malignant neoplasm of upper-inner quadrant of left breast in female, estrogen receptor positive ([place]) S/p bilateral mastectomy with reconstruction 01/07/2022.  Right breast pathology also showed invasive lobular carcinoma.  Follows with oncology, Dr. Burr Medico.  On active chemotherapy with docetaxel and Cytoxan with last treatment on 03/27/2022.  Might be having some deconditioning secondary to chemotherapy.  Hiatal hernia with GERD Likely cause of her sore throat/burning sensation.  Will give IV Protonix.  Anxiety Continue home Prozac and Xanax as needed.  DVT prophylaxis: enoxaparin (LOVENOX) injection 40 mg Start: 04/07/22 2200 Code Status: DNR, discussed with patient on admission. Family Communication: Husband at bedside. Disposition Plan: From home and likely discharge to  home pending clinical progress Consults called: None Severity of Illness: The appropriate patient status for this patient is OBSERVATION. Observation status is judged to be reasonable and necessary in order to provide the required intensity of service to ensure the patient's safety. The patient's presenting symptoms, physical exam findings, and initial radiographic and laboratory data in the context of their medical condition is felt to place them at decreased risk for further clinical deterioration. Furthermore, it is anticipated that the patient will be medically stable for discharge from the hospital within 2 midnights of admission.   Zada Finders MD [facility]  If 7PM-7AM, please contact night-coverage [email or web address]  04/07/2022, 7:48 PM

## 2022-04-07 NOTE — ED Notes (Signed)
RN was only able to obtain one set of blood cultures at this time.

## 2022-04-07 NOTE — Assessment & Plan Note (Signed)
Spiked fever to 100.6 last night.  ANC 400>> 800.  Likely from chemotherapy. -Continue IV cefepime.  Follow cultures and RVP

## 2022-04-07 NOTE — ED Triage Notes (Signed)
Pt reports recent chemo and since has had a sore throat, weakness, and shob since Thursday.    Reports at home covid-19 test was negative.   A/Ox4

## 2022-04-07 NOTE — ED Notes (Signed)
PT provided with ginger ale as well as an additional one for later.

## 2022-04-07 NOTE — Hospital Course (Signed)
Erika Hodge is a 56 y.o. female with medical history significant for bilateral breast cancer s/p b/l mastectomy and reconstruction (01/07/2022) on active therapy with docetaxel/Cytoxan (last infusion 03/27/2022), non-Hodgkin's lymphoma age 9, psoriasis, depression/anxiety who is admitted with bronchiectasis exacerbation, neutropenia, and deconditioning.

## 2022-04-07 NOTE — Assessment & Plan Note (Signed)
S/p bilateral mastectomy with reconstruction 01/07/2022.  Right breast pathology also showed invasive lobular carcinoma.  Follows with oncology, Dr. Burr Medico.  On active chemotherapy with docetaxel and Cytoxan with last treatment on 03/27/2022.  Might be having some deconditioning secondary to chemotherapy.

## 2022-04-07 NOTE — ED Provider Notes (Signed)
  Physical Exam  BP (!) 147/79   Pulse (!) 124   Temp 99.6 F (37.6 C) (Oral)   Resp (!) 25   SpO2 98%   Physical Exam  Procedures  .Critical Care Performed by: Regan Lemming, MD Authorized by: Regan Lemming, MD   Critical care provider statement:    Critical care time (minutes):  30   Critical care was necessary to treat or prevent imminent or life-threatening deterioration of the following conditions:  Sepsis   Critical care was time spent personally by me on the following activities:  Development of treatment plan with patient or surrogate, discussions with consultants, evaluation of patient's response to treatment, examination of patient, ordering and review of laboratory studies, ordering and review of radiographic studies, ordering and performing treatments and interventions, pulse oximetry, re-evaluation of patient's condition and review of old charts   Care discussed with: admitting provider    ED Course / MDM    Medical Decision Making Amount and/or Complexity of Data Reviewed Labs: ordered. Radiology: ordered.  Risk Prescription drug management. Decision regarding hospitalization.   56 year old female with medical history significant for non-Hodgkin's lymphoma on current chemotherapy, borderline febrile and tachycardic, currently neutropenic, pending CT angiogram for PE study, likely will require admission.   Patient vitals significant for afebrile, temperature 99.6, tachycardic and tachypneic, P124, RR 25, hypertensive BP 147/79, saturating 98% on room air.  CT angiogram PE study was performed and was negative for acute PE or evidence of bacterial pneumonia.  Laboratory work-up significant for urinalysis without evidence of UTI, lactic acid normal, troponin normal, BMP unremarkable, D-dimer elevated to 0.92, CBC with a leukopenia with a WBC of 3.7 and a neutropenia to 400, BMP unremarkable.  My primary concern is sepsis of unclear etiology, suspect potential viral  etiology.  COVID-19 and influenza PCR testing was pending.  Hospitalist medicine was consulted for admission for observation given the patient's neutropenia and concern for sepsis.  The patient was subsequent admitted in stable condition.  She was covered with broad-spectrum antibiotics prior to admission.   Regan Lemming, MD 04/07/22 [phone]

## 2022-04-07 NOTE — Assessment & Plan Note (Signed)
Continue home Prozac and Xanax as needed.

## 2022-04-07 NOTE — ED Provider Notes (Signed)
[name] Provider Note   CSN: [id number] Arrival date & time: 04/07/22  1208     History  Chief Complaint  Patient presents with   Sore Throat   Weakness    CA pt    Erika Hodge is a 56 y.o. female.   Patient as above with significant medical history as below, including non-Hodgkin's lymphoma on chemotherapy, last chemotherapy approximately 11 days ago, anxiety who presents to the ED with complaint of chest pain, dyspnea, sore throat.  Patient reports onset of symptoms in the past 2 to 3 days.  Associated with low-grade fever, chills, scratchy throat, exertional dyspnea, body aches, malaise.  No nausea or vomiting.  She is tolerant of p.o. intake without difficulty.  No sick contacts or recent travel.  No change in bowel or bladder function.  She has a nonproductive cough.     Past Medical History: No date: Anxiety No date: Complication of anesthesia     Comment:  HA No date: Non Hodgkin's lymphoma ([place]) No date: Psoriasis  Past Surgical History: 11/20/2021: BREAST BIOPSY; Left 01/07/2022: BREAST RECONSTRUCTION WITH PLACEMENT OF TISSUE EXPANDER  AND ALLODERM; Bilateral     Comment:  Procedure: BILATERAL BREAST RECONSTRUCTION WITH               PLACEMENT OF TISSUE EXPANDERS AND ALLODERM;  Surgeon:               Irene Limbo, MD;  Location: [facility];  Service: Plastics;  Laterality: Bilateral; 01/07/2022: NIPPLE SPARING MASTECTOMY; Right     Comment:  Procedure: RIGHT NIPPLE SPARING MASTECTOMY;  Surgeon:               Erroll Luna, MD;  Location: [facility];  Service: General;  Laterality: Right; 01/07/2022: NIPPLE SPARING MASTECTOMY WITH SENTINEL LYMPH NODE BIOPSY;  Left     Comment:  Procedure: LEFT NIPPLE SPARING MASTECTOMY WITH SENTINEL               LYMPH NODE BIOPSY;  Surgeon: Erroll Luna, MD;                Location: [facility];  Service:  General;                Laterality: Left; No date: TUBAL LIGATION    The history is provided by the patient and a relative. No language interpreter was used.  Sore Throat Associated symptoms include shortness of breath. Pertinent negatives include no chest pain, no abdominal pain and no headaches.  Weakness Associated symptoms: cough, fever and shortness of breath   Associated symptoms: no abdominal pain, no chest pain, no headaches, no nausea and no vomiting       Home Medications Prior to Admission medications   Medication Sig Start Date End Date Taking? Authorizing Provider  acetaminophen (TYLENOL) 500 MG tablet Take 1,000 mg by mouth every 6 (six) hours as needed for mild pain or fever.   Yes [provider]  ALPRAZolam Duanne Moron) 0.5 MG tablet Take 0.5 mg by mouth daily as needed for anxiety or sleep. 01/02/22  Yes [provider]  ASHWAGANDHA PO Take 1 tablet by mouth daily.   Yes [provider]  Calcipotriene-Betameth Diprop (ENSTILAR) 0.005-0.064 % FOAM Apply 1 application topically as needed (apply to  psoriasis area as needed).   Yes [provider]  dexamethasone (DECADRON) 4 MG tablet Take 1 tablet (4 mg total) by mouth 2 (two) times daily. Start the day before Taxotere. Then change to one tablet daily after chemo for 3 days. 02/04/22  Yes Truitt Merle, MD  FLUoxetine (PROZAC) 20 MG capsule Take 20 mg by mouth daily.   Yes [provider]  loratadine (CLARITIN) 10 MG tablet Take 10 mg by mouth daily.   Yes [provider]  omeprazole (PRILOSEC) 40 MG capsule Take 40 mg by mouth daily.   Yes [provider]  ondansetron (ZOFRAN) 8 MG tablet Take 1 tablet (8 mg total) by mouth 2 (two) times daily as needed for refractory nausea / vomiting. Start on day 3 after chemo. 02/04/22  Yes Truitt Merle, MD  prochlorperazine (COMPAZINE) 10 MG tablet Take 1 tablet (10 mg total) by mouth every 6 (six) hours as needed (Nausea or vomiting).  02/04/22  Yes Truitt Merle, MD  zolpidem (AMBIEN) 10 MG tablet Take 10 mg by mouth at bedtime as needed for sleep. 03/17/22  Yes [provider]      Allergies    Amoxicillin, Ibuprofen, Ciprofloxacin, and Codeine    Review of Systems   Review of Systems  Constitutional:  Positive for fatigue and fever. Negative for chills.  HENT:  Positive for sore throat. Negative for facial swelling and trouble swallowing.   Eyes:  Negative for photophobia and visual disturbance.  Respiratory:  Positive for cough and shortness of breath.   Cardiovascular:  Negative for chest pain and palpitations.  Gastrointestinal:  Negative for abdominal pain, nausea and vomiting.  Endocrine: Negative for polydipsia and polyuria.  Genitourinary:  Negative for difficulty urinating and hematuria.  Musculoskeletal:  Negative for gait problem and joint swelling.  Skin:  Negative for pallor and rash.  Neurological:  Positive for weakness. Negative for syncope and headaches.  Psychiatric/Behavioral:  Negative for agitation and confusion.    Physical Exam Updated Vital Signs BP 132/79   Pulse (!) 117   Temp 99.6 F (37.6 C) (Oral)   Resp (!) 22   SpO2 100%  Physical Exam Vitals and nursing note reviewed.  Constitutional:      General: She is not in acute distress.    Appearance: Normal appearance.  HENT:     Head: Normocephalic and atraumatic.     Jaw: There is normal jaw occlusion. No trismus.     Right Ear: External ear normal.     Left Ear: External ear normal.     Nose: Nose normal.     Mouth/Throat:     Mouth: Mucous membranes are moist.     Pharynx: Oropharynx is clear. Uvula midline. No pharyngeal swelling, oropharyngeal exudate, posterior oropharyngeal erythema or uvula swelling.     Tonsils: No tonsillar exudate.     Comments: No thrush Eyes:     General: No scleral icterus.       Right eye: No discharge.        Left eye: No discharge.  Cardiovascular:     Rate and Rhythm: Normal rate and  regular rhythm.     Pulses: Normal pulses.     Heart sounds: Normal heart sounds.  Pulmonary:     Effort: Pulmonary effort is normal. No respiratory distress.     Breath sounds: Normal breath sounds.  Abdominal:     General: Abdomen is flat.     Tenderness: There is no abdominal tenderness. There is  no guarding or rebound.  Musculoskeletal:        General: Normal range of motion.     Cervical back: Normal range of motion.     Right lower leg: No edema.     Left lower leg: No edema.  Skin:    General: Skin is warm and dry.     Capillary Refill: Capillary refill takes less than 2 seconds.  Neurological:     Mental Status: She is alert and oriented to person, place, and time.     GCS: GCS eye subscore is 4. GCS verbal subscore is 5. GCS motor subscore is 6.  Psychiatric:        Mood and Affect: Mood normal.        Behavior: Behavior normal.    ED Results / Procedures / Treatments   Labs (all labs ordered are listed, but only abnormal results are displayed) Labs Reviewed  D-DIMER, QUANTITATIVE - Abnormal; Notable for the following components:      Result Value   D-Dimer, Quant 0.92 (*)    All other components within normal limits  CBC WITH DIFFERENTIAL/PLATELET - Abnormal; Notable for the following components:   WBC 3.7 (*)    RBC 3.69 (*)    Hemoglobin 11.2 (*)    HCT 35.4 (*)    nRBC 1.1 (*)    Neutro Abs 0.4 (*)    Monocytes Absolute 1.3 (*)    All other components within normal limits  URINALYSIS, ROUTINE W REFLEX MICROSCOPIC - Abnormal; Notable for the following components:   Color, Urine STRAW (*)    Specific Gravity, Urine 1.004 (*)    All other components within normal limits  RESP PANEL BY RT-PCR (FLU A&B, COVID) ARPGX2  CULTURE, BLOOD (ROUTINE X 2)  CULTURE, BLOOD (ROUTINE X 2)  BASIC METABOLIC PANEL  BRAIN NATRIURETIC PEPTIDE  LACTIC ACID, PLASMA  LACTIC ACID, PLASMA  TROPONIN I (HIGH SENSITIVITY)  TROPONIN I (HIGH SENSITIVITY)    EKG EKG  Interpretation  Date/Time:  Monday Apr 07 2022 12:17:22 EDT Ventricular Rate:  113 PR Interval:  118 QRS Duration: 82 QT Interval:  312 QTC Calculation: 428 R Axis:   55 Text Interpretation: Sinus tachycardia Confirmed by Regan Lemming ([id number]) on 04/07/2022 4:57:59 PM  Radiology CT Angio Chest PE W and/or Wo Contrast  Result Date: 04/07/2022 CLINICAL DATA:  Shortness of breath, pharyngitis and weakness for the past for days. Recent chemotherapy. Status post bilateral nipple sparing mastectomy and sentinel node biopsy for left breast cancer on 01/07/2022 with implant reconstruction. 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 Previous mid chest radiation for non-Hodgkin's lymphoma in the 1980's. EXAM: CT ANGIOGRAPHY CHEST WITH CONTRAST TECHNIQUE: Multidetector CT imaging of the chest was performed using the standard protocol during bolus administration of intravenous contrast. Multiplanar CT image reconstructions and MIPs were obtained to evaluate the vascular anatomy. RADIATION DOSE REDUCTION: This exam was performed according to the departmental dose-optimization program which includes automated exposure control, adjustment of the mA and/or kV according to patient size and/or use of iterative reconstruction technique. CONTRAST:  24m OMNIPAQUE IOHEXOL 350 MG/ML SOLN COMPARISON:  Portable chest obtained earlier today. FINDINGS: Cardiovascular: Satisfactory opacification of the pulmonary arteries to the segmental level. No evidence of pulmonary embolism. Normal heart size. No pericardial effusion. Mediastinum/Nodes: Peripherally calcified exophytic nodule rising from the inferior aspect of the left lobe of the thyroid gland, extending into a substernal location. This  measures 1.8 cm in maximum diameter on image number 45/7. No enlarged lymph nodes. Small hiatal hernia with fluid in the mid and distal  esophagus. Lungs/Pleura: 7 x 6 mm noncalcified nodule in the left lower lobe on image number 56/8. 5 mm nodule in the medial aspect of the lingula on image number 80/8. 5 mm right lower lobe nodule on image number 82/8. Mild cylindrical bronchiectasis in the medial aspects of both upper lobes. Upper Abdomen: Unremarkable. Musculoskeletal: Bilateral breast implants mild thoracic and lower cervical spine degenerative changes. No evidence of bony metastatic disease. Review of the MIP images confirms the above findings. IMPRESSION: 1. No pulmonary emboli. 2. 3 small lung nodules, as described above. The most suspicious is in the left lower lobe with a mean diameter of 6.5 mm. Given the history of bilateral invasive breast cancer, metastatic disease is a consideration. Therefore, recommend a follow-up chest CT with contrast in 3 months. This is probably too small to accurately characterize with PET-CT at this time. 3. Mild cylindrical bronchiectasis in the medial aspects of both upper lobes, most likely representing postradiation changes. 4. Small hiatal hernia with evidence of gastroesophageal reflux with associated fluid in the mid and distal esophagus. Electronically Signed   By: Claudie Revering M.D.   On: 04/07/2022 16:44   DG Chest Port 1 View  Result Date: 04/07/2022 CLINICAL DATA:  dib EXAM: PORTABLE CHEST 1 VIEW COMPARISON:  None Available. FINDINGS: No consolidation. No visible pleural effusions or pneumothorax. Cardiomediastinal silhouette is within normal limits. No displaced fracture. IMPRESSION: No evidence of acute cardiopulmonary disease. Electronically Signed   By: Margaretha Sheffield M.D.   On: 04/07/2022 14:09    Procedures Procedures    Medications Ordered in ED Medications  ceFEPIme (MAXIPIME) 2 g in sodium chloride 0.9 % 100 mL IVPB (2 g Intravenous New Bag/Given 04/07/22 1746)  metoCLOPramide (REGLAN) injection 5 mg (5 mg Intravenous Given 04/07/22 1332)  diphenhydrAMINE (BENADRYL) injection  25 mg (25 mg Intravenous Given 04/07/22 1332)  lidocaine (XYLOCAINE) 2 % viscous mouth solution 15 mL (15 mLs Mouth/Throat Given 04/07/22 1331)  sodium chloride (PF) 0.9 % injection (  Given by Other 04/07/22 1605)  iohexol (OMNIPAQUE) 350 MG/ML injection 75 mL (75 mLs Intravenous Contrast Given 04/07/22 1557)  sodium chloride 0.9 % bolus 1,000 mL (1,000 mLs Intravenous New Bag/Given 04/07/22 1745)    ED Course/ Medical Decision Making/ A&P                           Medical Decision Making Amount and/or Complexity of Data Reviewed Labs: ordered. Radiology: ordered.  Risk Prescription drug management. Decision regarding hospitalization.    CC: Sore throat, chest tightness, dyspnea  This patient presents to the Emergency Department for the above complaint. This involves an extensive number of treatment options and is a complaint that carries with it a high risk of complications and morbidity. Vital signs were reviewed. Serious etiologies considered.  Differential includes all life-threatening causes for chest pain. This includes but is not exclusive to acute coronary syndrome, aortic dissection, pulmonary embolism, cardiac tamponade, community-acquired pneumonia, pericarditis, musculoskeletal chest wall pain, etc.   Record review:  Previous records obtained and reviewed  Recent oncology office notes, prior labs and imaging  Additional history obtained from family at bedside  Medical and surgical history as noted above.   Work up as above, notable for:  Labs & imaging results that were available during my care of the patient  were visualized by me and considered in my medical decision making.   Patient with moderate Wells score, Dimer elevated, will get CTPE  I ordered imaging studies which included chest x-ray, CT PE. I visualized the imaging, interpreted images, and I agree with radiologist interpretation. CXR neg, CTPE pending at time of shift change.  Cardiac monitoring reviewed  and interpreted personally which shows sinus tachycardia  Neutropenic, low grade fever, unclear source of infection at this time, will start cefepime and neutropenic precautions. Send blood cultures. Last chemo treatment was around 10-11 days ago  Management: Cefepime, blood ctx., IVF   Admission was considered.   Patient found to have neutropenia, low-grade fever, she also has leukopenia and tachycardia.  No clear source infections but identified, potentially viral.  CT imaging of the chest is pending at shift change.  Given history of malignancy, chemotherapy and neutropenia will place patient on neutropenic precautions, start cefepime.  Patient signed out to incoming EDP pending urinalysis and eventual admission.  Patient is HDS         Social determinants of health include -  Social History   Socioeconomic History   Marital status: Married    Spouse name: Not on file   Number of children: 3   Years of education: Not on file   Highest education level: Not on file  Occupational History   Not on file  Tobacco Use   Smoking status: Former    Packs/day: 0.50    Years: 15.00    Pack years: 7.50    Types: Cigarettes    Quit date: 2016    Years since quitting: 7.4    Passive exposure: Never   Smokeless tobacco: Never  Substance and Sexual Activity   Alcohol use: Yes    Comment: occas   Drug use: Never   Sexual activity: Not on file  Other Topics Concern   Not on file  Social History Narrative   Not on file   Social Determinants of Health   Financial Resource Strain: Low Risk    Difficulty of Paying Living Expenses: Not hard at all  Food Insecurity: No Food Insecurity   Worried About Charity fundraiser in the Last Year: Never true   [place] in the Last Year: Never true  Transportation Needs: No Transportation Needs   Lack of Transportation (Medical): No   Lack of Transportation (Non-Medical): No  Physical Activity: Not on file  Stress: Not on file   Social Connections: Not on file  Intimate Partner Violence: Not on file      This chart was dictated using voice recognition software.  Despite best efforts to proofread,  errors can occur which can change the documentation meaning.         Final Clinical Impression(s) / ED Diagnoses Final diagnoses:  Sepsis, due to unspecified organism, unspecified whether acute organ dysfunction present ([place])  Neutropenia, unspecified type ([place])  Dyspnea, unspecified type  Pharyngitis, unspecified etiology    Rx / DC Orders ED Discharge Orders     None         Jeanell Sparrow, DO 04/07/22 1755

## 2022-04-07 NOTE — Assessment & Plan Note (Addendum)
Patient presents with respiratory symptoms.  CTA chest concerning for bronchiectasis bilaterally.  Felt to be postradiation change.  She is neutropenic and febrile.  Respiratory symptoms better but significant sore throat with odynophagia.  She also reports postnasal drip and fullness in the ear.  Blood and respiratory culture pending.  Neutropenia improving -Check full RVP -Continue IV cefepime given neutropenia. -Continue saline nebs, incentive telemetry, flutter valve, mucolytic's -Claritin, Flonase nasal spray and Magic mouthwash with lidocaine

## 2022-04-07 NOTE — Assessment & Plan Note (Signed)
P.o. Protonix 40 mg twice daily

## 2022-04-07 NOTE — Progress Notes (Signed)
Pt instructed on use of Flutter Valve.  Pt demonstrated with good effort and technique.  

## 2022-04-07 NOTE — Progress Notes (Signed)
A consult was received from an ED physician for cefepime per pharmacy dosing.  The patient's profile has been reviewed for ht/wt/allergies/indication/available labs.    Of note, amoxicillin is listed in allergy section with rxn noted as "throat closing.: Patient received ancef in the past.  A one time order has been placed for cefepime 2gm x1.Will ask Pt's RN to monitor her closely with first dose of cefepime.    Further antibiotics/pharmacy consults should be ordered by admitting physician if indicated.                       Thank you, Lynelle Doctor 04/07/2022  5:06 PM

## 2022-04-08 ENCOUNTER — Other Ambulatory Visit: Payer: Self-pay

## 2022-04-08 ENCOUNTER — Ambulatory Visit: Payer: BC Managed Care – PPO | Admitting: Rehabilitation

## 2022-04-08 ENCOUNTER — Encounter (HOSPITAL_COMMUNITY): Payer: Self-pay | Admitting: Internal Medicine

## 2022-04-08 DIAGNOSIS — G2581 Restless legs syndrome: Secondary | ICD-10-CM

## 2022-04-08 DIAGNOSIS — J701 Chronic and other pulmonary manifestations due to radiation: Secondary | ICD-10-CM | POA: Diagnosis present

## 2022-04-08 DIAGNOSIS — K219 Gastro-esophageal reflux disease without esophagitis: Secondary | ICD-10-CM | POA: Diagnosis present

## 2022-04-08 DIAGNOSIS — T451X5A Adverse effect of antineoplastic and immunosuppressive drugs, initial encounter: Secondary | ICD-10-CM | POA: Diagnosis present

## 2022-04-08 DIAGNOSIS — D638 Anemia in other chronic diseases classified elsewhere: Secondary | ICD-10-CM

## 2022-04-08 DIAGNOSIS — C50212 Malignant neoplasm of upper-inner quadrant of left female breast: Secondary | ICD-10-CM

## 2022-04-08 DIAGNOSIS — D6481 Anemia due to antineoplastic chemotherapy: Secondary | ICD-10-CM | POA: Diagnosis present

## 2022-04-08 DIAGNOSIS — F419 Anxiety disorder, unspecified: Secondary | ICD-10-CM | POA: Diagnosis present

## 2022-04-08 DIAGNOSIS — D709 Neutropenia, unspecified: Secondary | ICD-10-CM

## 2022-04-08 DIAGNOSIS — E669 Obesity, unspecified: Secondary | ICD-10-CM

## 2022-04-08 DIAGNOSIS — Z170 Estrogen receptor positive status [ER+]: Secondary | ICD-10-CM

## 2022-04-08 DIAGNOSIS — J471 Bronchiectasis with (acute) exacerbation: Secondary | ICD-10-CM | POA: Diagnosis present

## 2022-04-08 DIAGNOSIS — E876 Hypokalemia: Secondary | ICD-10-CM

## 2022-04-08 DIAGNOSIS — D701 Agranulocytosis secondary to cancer chemotherapy: Secondary | ICD-10-CM | POA: Diagnosis present

## 2022-04-08 DIAGNOSIS — Z9013 Acquired absence of bilateral breasts and nipples: Secondary | ICD-10-CM | POA: Diagnosis not present

## 2022-04-08 DIAGNOSIS — F32A Depression, unspecified: Secondary | ICD-10-CM | POA: Diagnosis present

## 2022-04-08 DIAGNOSIS — R918 Other nonspecific abnormal finding of lung field: Secondary | ICD-10-CM | POA: Diagnosis present

## 2022-04-08 DIAGNOSIS — R5081 Fever presenting with conditions classified elsewhere: Secondary | ICD-10-CM

## 2022-04-08 DIAGNOSIS — K449 Diaphragmatic hernia without obstruction or gangrene: Secondary | ICD-10-CM

## 2022-04-08 DIAGNOSIS — Z20822 Contact with and (suspected) exposure to covid-19: Secondary | ICD-10-CM | POA: Diagnosis present

## 2022-04-08 DIAGNOSIS — Z66 Do not resuscitate: Secondary | ICD-10-CM | POA: Diagnosis present

## 2022-04-08 DIAGNOSIS — Y842 Radiological procedure and radiotherapy as the cause of abnormal reaction of the patient, or of later complication, without mention of misadventure at the time of the procedure: Secondary | ICD-10-CM | POA: Diagnosis present

## 2022-04-08 DIAGNOSIS — Z87891 Personal history of nicotine dependence: Secondary | ICD-10-CM | POA: Diagnosis not present

## 2022-04-08 DIAGNOSIS — Z8572 Personal history of non-Hodgkin lymphomas: Secondary | ICD-10-CM | POA: Diagnosis not present

## 2022-04-08 DIAGNOSIS — Z6832 Body mass index (BMI) 32.0-32.9, adult: Secondary | ICD-10-CM | POA: Diagnosis not present

## 2022-04-08 HISTORY — DX: Obesity, unspecified: E66.9

## 2022-04-08 HISTORY — DX: Anemia in other chronic diseases classified elsewhere: D63.8

## 2022-04-08 HISTORY — DX: Hypokalemia: E87.6

## 2022-04-08 HISTORY — DX: Restless legs syndrome: G25.81

## 2022-04-08 LAB — RESPIRATORY PANEL BY PCR

## 2022-04-08 LAB — CBC WITH DIFFERENTIAL/PLATELET
Abs Immature Granulocytes: 0.07 10*3/uL (ref 0.00–0.07)
Basophils Absolute: 0.1 10*3/uL (ref 0.0–0.1)
Basophils Relative: 1 %
Eosinophils Absolute: 0 10*3/uL (ref 0.0–0.5)
Eosinophils Relative: 1 %
HCT: 31.7 % — ABNORMAL LOW (ref 36.0–46.0)
Hemoglobin: 10.1 g/dL — ABNORMAL LOW (ref 12.0–15.0)
Immature Granulocytes: 2 %
Lymphocytes Relative: 30 %
Lymphs Abs: 1.3 10*3/uL (ref 0.7–4.0)
MCH: 30.8 pg (ref 26.0–34.0)
MCHC: 31.9 g/dL (ref 30.0–36.0)
MCV: 96.6 fL (ref 80.0–100.0)
Monocytes Absolute: 2.1 10*3/uL — ABNORMAL HIGH (ref 0.1–1.0)
Monocytes Relative: 48 %
Neutro Abs: 0.8 10*3/uL — ABNORMAL LOW (ref 1.7–7.7)
Neutrophils Relative %: 18 %
Platelets: 360 10*3/uL (ref 150–400)
RBC: 3.28 MIL/uL — ABNORMAL LOW (ref 3.87–5.11)
RDW: 15.6 % — ABNORMAL HIGH (ref 11.5–15.5)
WBC: 4.4 10*3/uL (ref 4.0–10.5)
nRBC: 0.9 % — ABNORMAL HIGH (ref 0.0–0.2)

## 2022-04-08 LAB — RETICULOCYTES
Immature Retic Fract: 25.6 % — ABNORMAL HIGH (ref 2.3–15.9)
RBC.: 3.49 MIL/uL — ABNORMAL LOW (ref 3.87–5.11)
Retic Count, Absolute: 123.9 10*3/uL (ref 19.0–186.0)
Retic Ct Pct: 3.6 % — ABNORMAL HIGH (ref 0.4–3.1)

## 2022-04-08 LAB — IRON AND TIBC
Iron: 36 ug/dL (ref 28–170)
Saturation Ratios: 13 % (ref 10.4–31.8)
TIBC: 284 ug/dL (ref 250–450)
UIBC: 248 ug/dL

## 2022-04-08 LAB — BASIC METABOLIC PANEL
Anion gap: 7 (ref 5–15)
BUN: 8 mg/dL (ref 6–20)
CO2: 23 mmol/L (ref 22–32)
Calcium: 8.1 mg/dL — ABNORMAL LOW (ref 8.9–10.3)
Chloride: 108 mmol/L (ref 98–111)
Creatinine, Ser: 0.73 mg/dL (ref 0.44–1.00)
GFR, Estimated: >60 mL/min (ref >60)
Glucose, Bld: 155 mg/dL — ABNORMAL HIGH (ref 70–99)
Potassium: 3.3 mmol/L — ABNORMAL LOW (ref 3.5–5.1)
Sodium: 138 mmol/L (ref 135–145)

## 2022-04-08 LAB — FOLATE: Folate: 14.4 ng/mL (ref >5.9)

## 2022-04-08 LAB — EXPECTORATED SPUTUM ASSESSMENT W GRAM STAIN, RFLX TO RESP C

## 2022-04-08 LAB — FERRITIN: Ferritin: 112 ng/mL (ref 11–307)

## 2022-04-08 LAB — MAGNESIUM: Magnesium: 2.1 mg/dL (ref 1.7–2.4)

## 2022-04-08 LAB — VITAMIN B12: Vitamin B-12: 568 pg/mL (ref 180–914)

## 2022-04-08 LAB — HIV ANTIBODY (ROUTINE TESTING W REFLEX): HIV Screen 4th Generation wRfx: NONREACTIVE

## 2022-04-08 MED ORDER — MAGIC MOUTHWASH W/LIDOCAINE
5.0000 mL | Freq: Four times a day (QID) | ORAL | Status: DC
  Administered 2022-04-08 – 2022-04-09 (×5): 5 mL via ORAL
  Filled 2022-04-08 (×7): qty 5

## 2022-04-08 MED ORDER — ROPINIROLE HCL 1 MG PO TABS
0.5000 mg | ORAL_TABLET | Freq: Every day | ORAL | Status: DC
  Administered 2022-04-08: 0.5 mg via ORAL
  Filled 2022-04-08: qty 1

## 2022-04-08 MED ORDER — POTASSIUM CHLORIDE 20 MEQ PO PACK
40.0000 meq | PACK | ORAL | Status: AC
  Administered 2022-04-08 (×2): 40 meq via ORAL
  Filled 2022-04-08 (×2): qty 2

## 2022-04-08 MED ORDER — FLUTICASONE PROPIONATE 50 MCG/ACT NA SUSP
2.0000 | Freq: Every day | NASAL | Status: DC
  Administered 2022-04-08 – 2022-04-09 (×2): 2 via NASAL
  Filled 2022-04-08: qty 16

## 2022-04-08 MED ORDER — PANTOPRAZOLE SODIUM 40 MG PO TBEC
40.0000 mg | DELAYED_RELEASE_TABLET | Freq: Two times a day (BID) | ORAL | Status: DC
Start: 2022-04-08 — End: 2022-04-08

## 2022-04-08 MED ORDER — PANTOPRAZOLE SODIUM 40 MG PO TBEC
40.0000 mg | DELAYED_RELEASE_TABLET | Freq: Two times a day (BID) | ORAL | Status: DC
  Administered 2022-04-08 – 2022-04-09 (×3): 40 mg via ORAL
  Filled 2022-04-08 (×3): qty 1

## 2022-04-08 NOTE — Progress Notes (Signed)
SATURATION QUALIFICATIONS: (This note is used to comply with regulatory documentation for home oxygen)  Patient Saturations on Room Air at Rest = 95%  Patient Saturations on Room Air while Ambulating = 97%  Patient Saturations on 0 Liters of oxygen while Ambulating = 97%  Please briefly explain why patient needs home oxygen:

## 2022-04-08 NOTE — Progress Notes (Signed)
PT Cancellation Note / Screen  Patient Details Name: MARKEA RUZICH MRN: [id number] DOB: 1966/01/23   Cancelled Treatment:    Reason Eval/Treat Not Completed: PT screened, no needs identified, will sign off Pt ambulated with OT in hallway, and OT reports pt doesn't have PT needs at this time.  PT to sign off.   Myrtis Hopping [place] 04/08/2022, 11:31 AM Arlyce Dice, DPT Acute Rehabilitation Services Pager: [phone] Office: [phone]

## 2022-04-08 NOTE — Assessment & Plan Note (Addendum)
-   Potassium is improved and gone from 3.3 is now 4.1 -Continue monitor and replete as necessary -Repeat CMP within a week

## 2022-04-08 NOTE — TOC Initial Note (Signed)
Transition of Care [facility]) - Initial/Assessment Note    Patient Details  Name: NITASHA JEWEL MRN: [id number] Date of Birth: 11-Jan-1966  Transition of Care [facility]) CM/SW Contact:    Leeroy Cha, RN Phone Number: 04/08/2022, 7:53 AM  Clinical Narrative:                  Transition of Care [facility]) Screening Note   Patient Details  Name: DALLANA MAVITY Date of Birth: [birth year]   Transition of Care [facility]) CM/SW Contact:    Leeroy Cha, RN Phone Number: 04/08/2022, 7:53 AM    Transition of Care Department [facility]) has reviewed patient and no TOC needs have been identified at this time. We will continue to monitor patient advancement through interdisciplinary progression rounds. If new patient transition needs arise, please place a TOC consult.    Expected Discharge Plan: Home/Self Care Barriers to Discharge: No Barriers Identified   Patient Goals and CMS Choice Patient states their goals for this hospitalization and ongoing recovery are:: to return to my home and be well CMS Medicare.gov Compare Post Acute Care list provided to:: Patient    Expected Discharge Plan and Services Expected Discharge Plan: Home/Self Care   Discharge Planning Services: CM Consult   Living arrangements for the past 2 months: Single Family Home                                      Prior Living Arrangements/Services Living arrangements for the past 2 months: Single Family Home Lives with:: Spouse Patient language and need for interpreter reviewed:: Yes Do you feel safe going back to the place where you live?: Yes            Criminal Activity/Legal Involvement Pertinent to Current Situation/Hospitalization: No - Comment as needed  Activities of Daily Living Home Assistive Devices/Equipment: None ADL Screening (condition at time of admission) Patient's cognitive ability adequate to safely complete daily activities?: Yes Is the patient deaf or have difficulty hearing?:  No Does the patient have difficulty seeing, even when wearing glasses/contacts?: No Does the patient have difficulty concentrating, remembering, or making decisions?: No Patient able to express need for assistance with ADLs?: Yes Does the patient have difficulty dressing or bathing?: No Independently performs ADLs?: Yes (appropriate for developmental age) Does the patient have difficulty walking or climbing stairs?: No Weakness of Legs: None Weakness of Arms/Hands: None  Permission Sought/Granted                  Emotional Assessment Appearance:: Appears stated age     Orientation: : Oriented to Self, Oriented to Place, Oriented to  Time, Oriented to Situation Alcohol / Substance Use: Not Applicable Psych Involvement: No (comment)  Admission diagnosis:  Bronchiectasis with acute exacerbation (HCC) [J47.1] Neutropenia, unspecified type ([place]) [D70.9] Dyspnea, unspecified type [R06.00] Pharyngitis, unspecified etiology [J02.9] Sepsis, due to unspecified organism, unspecified whether acute organ dysfunction present [facility]) [A41.9] Patient Active Problem List   Diagnosis Date Noted   Bronchiectasis with acute exacerbation ([place]) 04/07/2022   Neutropenia ([place]) 04/07/2022   Pulmonary nodules 04/07/2022   Anxiety    Hiatal hernia with GERD    Invasive lobular carcinoma of right breast, stage 1 ([place]) 01/24/2022   Genetic testing 12/11/2021   Family history of breast cancer 11/29/2021   Malignant neoplasm of upper-inner quadrant of left breast in female, estrogen receptor positive ([place]) 11/26/2021   PCP:  Hague, Rosalyn Charters, MD Pharmacy:   [id number] [place], [place] - [address] [facility] [address]Phone: [phone]: [phone]    Social Determinants of Health (SDOH) Interventions    Readmission Risk Interventions     View : No data to display.

## 2022-04-08 NOTE — Evaluation (Signed)
Occupational Therapy Evaluation Patient Details Name: Erika Hodge MRN: [id number] DOB: 1966-03-13 Today's Date: 04/08/2022   History of Present Illness Pt si a 65 year olf female admitted 56 year old F with with SOB, DOE, productive cough, sore throat, fatigue, nausea and poor p.o. intake, and admitted for bronchiectasis and neutropenia.  CTA chest negative for PE but bronchiectasis in both upper lobes felt to be postradiation changes, and 3 small lung nodules. PMH of b/l breast cancer s/p b/l mastectomy and reconstruction in in 12/2021 on chemotherapy (last infusion 03/27/2022), NHL since age of 49, psoriasi   Clinical Impression   Pt greeted in bed with husband present, alert and oriented x4. She reports feeling generally weak, however agreeable to OT evaluation. Pt amb within room and in hallway approx 200' with intermittent pushing dynamap with MOD I. Simulated grooming and toilet transfers completed with supervision. Pt and husband report they can get equipment as needed, educated on [facility] techniques and use of DME for generalized weakness. Pt has no further OT Needs at this time. Please reconsult if there is a change in functional status.   Vitals are as follows:  Supine: spo2 98% on RA, HR 104 Amb 200' in hallway: SPO2 97%, HR 123 At rest, in bed after approx 1 minute: Spo2 98%, HR 107     Recommendations for follow up therapy are one component of a multi-disciplinary discharge planning process, led by the attending physician.  Recommendations may be updated based on patient status, additional functional criteria and insurance authorization.   Follow Up Recommendations  No OT follow up    Assistance Recommended at Discharge Intermittent Supervision/Assistance  Patient can return home with the following Assistance with cooking/housework    Functional Status Assessment  Patient has had a recent decline in their functional status and demonstrates the ability to make significant  improvements in function in a reasonable and predictable amount of time.  Equipment Recommendations  Tub/shower seat;Other (comment) (family will purchase if needed)    Recommendations for Other Services       Precautions / Restrictions Precautions Precautions: None Restrictions Weight Bearing Restrictions: No      Mobility Bed Mobility Overal bed mobility: Modified Independent             General bed mobility comments: slightly increased time    Transfers Overall transfer level: Modified independent Equipment used: None (pushed dynamap PRN)                      Balance Overall balance assessment: Needs assistance Sitting-balance support: Feet supported Sitting balance-Leahy Scale: Normal     Standing balance support: Single extremity supported Standing balance-Leahy Scale: Good                             ADL either performed or assessed with clinical judgement   ADL Overall ADL's : At baseline                                       General ADL Comments: supervision-MOD I for simulated toilet transfer, grooming at sink level; anticipate SET UP-MOD I for dressing     Vision Patient Visual Report: No change from baseline       Perception     Praxis      Pertinent Vitals/Pain Pain Assessment Pain Assessment: 0-10 Pain Score: 3  Pain Location: throat Pain Descriptors / Indicators: Discomfort Pain Intervention(s): Monitored during session     Hand Dominance     Extremity/Trunk Assessment Upper Extremity Assessment Upper Extremity Assessment: Overall WFL for tasks assessed   Lower Extremity Assessment Lower Extremity Assessment: Overall WFL for tasks assessed   Cervical / Trunk Assessment Cervical / Trunk Assessment: Normal   Communication Communication Communication: No difficulties   Cognition Arousal/Alertness: Awake/alert Behavior During Therapy: WFL for tasks assessed/performed Overall Cognitive  Status: Within Functional Limits for tasks assessed                                       General Comments       Exercises     Shoulder Instructions      Home Living Family/patient expects to be discharged to:: Private residence Living Arrangements: Spouse/significant other Available Help at Discharge: Family Type of Home: House Home Access: Level entry     Home Layout: Two level;Able to live on main level with bedroom/bathroom Alternate Level Stairs-Number of Steps: 12             Home Equipment: None (can get equipment as needed per pt and husband report)          Prior Functioning/Environment Prior Level of Function : Independent/Modified Independent             Mobility Comments: amb with no AD ADLs Comments: mod I in ADL; assist with IADLs as needed        OT Problem List:        OT Treatment/Interventions:      OT Goals(Current goals can be found in the care plan section) Acute Rehab OT Goals Patient Stated Goal: go home OT Goal Formulation: With patient/family Time For Goal Achievement: 04/22/22 Potential to Achieve Goals: Good  OT Frequency:      Co-evaluation              AM-PAC OT "6 Clicks" Daily Activity     Outcome Measure Help from another person eating meals?: None Help from another person taking care of personal grooming?: None Help from another person toileting, which includes using toliet, bedpan, or urinal?: None Help from another person bathing (including washing, rinsing, drying)?: None Help from another person to put on and taking off regular upper body clothing?: None Help from another person to put on and taking off regular lower body clothing?: None 6 Click Score: 24   End of Session Nurse Communication: Mobility status  Activity Tolerance: Patient tolerated treatment well Patient left: in bed;with call bell/phone within reach;with nursing/sitter in room;with family/visitor present  OT Visit  Diagnosis: Unsteadiness on feet (R26.81)                Time: 0355-9741 OT Time Calculation (min): 19 min Charges:  OT General Charges $OT Visit: 1 Visit OT Evaluation $OT Eval Low Complexity: 1 Low  Shanon Payor, OTD OTR/L  04/08/22, 11:53 AM

## 2022-04-08 NOTE — Assessment & Plan Note (Signed)
Ferritin is somewhat normal.   -Requip 0.5 mg at night

## 2022-04-08 NOTE — Progress Notes (Signed)
PROGRESS NOTE  Erika Hodge DVV:[id number] DOB: 07-08-1966   PCP: Bonnita Nasuti, MD  Patient is from: Home.  Lives with husband.  DOA: 04/07/2022 LOS: 0  Chief complaints Chief Complaint  Patient presents with   Sore Throat   Weakness    CA pt     Brief Narrative / Interim history: 56 year old F with PMH of b/l breast cancer s/p b/l mastectomy and reconstruction in in 12/2021 on chemotherapy (last infusion 03/27/2022), NHL since age of 59, psoriasis, anxiety and depression presenting with SOB, DOE, productive cough, sore throat, fatigue, nausea and poor p.o. intake, and admitted for bronchiectasis and neutropenia.  CTA chest negative for PE but bronchiectasis in both upper lobes felt to be postradiation changes, and 3 small lung nodules.  ANC was 400.  Blood cultures obtained.  She was started on cefepime and hypertonic saline nebs.    Subjective: Seen and examined earlier this morning.  No major events overnight of this morning.  Continues to endorse significant sore throat and odynophagia.  Also reports postnasal drip and bilateral ear pain.  Per husband, she was moving her legs a lot last night.  No history of RLS.  Objective: Vitals:   04/07/22 2047 04/08/22 0038 04/08/22 0913 04/08/22 1021  BP:  123/62 (!) 146/86   Pulse:  (!) 113 100   Resp:  19 18   Temp:  98.5 F (36.9 C) 98.1 F (36.7 C)   TempSrc:  Oral Oral   SpO2: 99% 98% 97% 97%  Weight:      Height:        Examination:  GENERAL: No apparent distress.  Nontoxic. HEENT: MMM.  Vision and hearing grossly intact.  NECK: Supple.  No apparent JVD.  No palpable lymphadenopathy or tenderness RESP:  No IWOB.  Fair aeration bilaterally.  Somewhat congested in the nose. CVS:  RRR. Heart sounds normal.  ABD/GI/GU: BS+. Abd soft, NTND.  MSK/EXT:  Moves extremities. No apparent deformity. No edema.  SKIN: no apparent skin lesion or wound NEURO: Awake, alert and oriented appropriately.  No apparent focal neuro  deficit. PSYCH: Calm. Normal affect.   Procedures:  None  Microbiology summarized: [id number] and influenza PCR nonreactive. RVP pending. Blood culture pending. Respiratory culture pending.  Assessment and Plan: * Bronchiectasis with acute exacerbation [facility]) Patient presents with respiratory symptoms.  CTA chest concerning for bronchiectasis bilaterally.  Felt to be postradiation change.  She is neutropenic and febrile.  Respiratory symptoms better but significant sore throat with odynophagia.  She also reports postnasal drip and fullness in the ear.  Blood and respiratory culture pending.  Neutropenia improving -Check full RVP -Continue IV cefepime given neutropenia. -Continue saline nebs, incentive telemetry, flutter valve, mucolytic's -Claritin, Flonase nasal spray and Magic mouthwash with lidocaine  Neutropenic fever (HCC) Spiked fever to 100.6 last night.  ANC 400>> 800.  Likely from chemotherapy. -Continue IV cefepime.  Follow cultures and RVP  Malignant neoplasm of upper-inner quadrant of left breast in female, estrogen receptor positive ([place]) S/p b/l  mastectomy with reconstruction 01/07/2022. On chemotherapy.  Last dose 03/27/2022.  -Oncology added to treatment team  Pulmonary nodules 3 small lung nodules noted on CTA chest.  Repeat CT chest in 3 months recommended -Patient and patient's husband aware of this.  Obesity (BMI 30-39.9) Body mass index is 32.37 kg/m.   Restless leg syndrome Ferritin is somewhat normal.   -Requip 0.5 mg at night  Anemia of chronic disease Recent Labs    11/27/21 0817 02/11/22  1343 02/20/22 1144 03/06/22 1152 03/27/22 1118 04/07/22 1312 04/08/22 0259  HGB 13.4 12.4 12.7 11.8* 11.5* 11.2* 10.1*  H&H relatively stable.  Anemia panel basically normal. -Monitor   Hypokalemia K3.3.  P.o. KCl 40x2  Hiatal hernia with GERD P.o. Protonix 40 mg twice daily  Anxiety Continue home Prozac and Xanax as needed.   DVT prophylaxis:   enoxaparin (LOVENOX) injection 40 mg Start: 04/07/22 2200  Code Status: DNR/DNI Family Communication: Updated patient's husband at bedside. Level of care: Telemetry Status is: Observation The patient will require care spanning > 2 midnights and should be moved to inpatient because: Bronchiectasis and neutropenic fever in immunocompromised patient requiring IV antibiotics   Final disposition: Likely home once medically stable Consultants:  Oncology  Sch Meds:  Scheduled Meds:  enoxaparin (LOVENOX) injection  40 mg Subcutaneous Q24H   FLUoxetine  20 mg Oral Daily   fluticasone  2 spray Each Nare Daily   guaiFENesin  600 mg Oral BID   magic mouthwash w/lidocaine  5 mL Oral QID   pantoprazole  40 mg Oral BID   potassium chloride  40 mEq Oral Q4H   rOPINIRole  0.5 mg Oral QHS   sodium chloride flush  3 mL Intravenous Q12H   sodium chloride HYPERTONIC  4 mL Nebulization BID   Continuous Infusions:  ceFEPime (MAXIPIME) IV 2 g (04/08/22 0852)   PRN Meds:.acetaminophen **OR** acetaminophen, ALPRAZolam, ondansetron **OR** ondansetron (ZOFRAN) IV, phenol, senna-docusate, zolpidem  Antimicrobials: Anti-infectives (From admission, onward)    Start     Dose/Rate Route Frequency Ordered Stop   04/08/22 0100  ceFEPIme (MAXIPIME) 2 g in sodium chloride 0.9 % 100 mL IVPB        2 g 200 mL/hr over 30 Minutes Intravenous Every 8 hours 04/07/22 1934     04/07/22 1715  ceFEPIme (MAXIPIME) 2 g in sodium chloride 0.9 % 100 mL IVPB        2 g 200 mL/hr over 30 Minutes Intravenous  Once 04/07/22 1706 04/07/22 1828        I have personally reviewed the following labs and images: CBC: Recent Labs  Lab 04/07/22 1312 04/08/22 0259  WBC 3.7* 4.4  NEUTROABS 0.4* 0.8*  HGB 11.2* 10.1*  HCT 35.4* 31.7*  MCV 95.9 96.6  PLT 395 360   BMP &GFR Recent Labs  Lab 04/07/22 1312 04/08/22 0259  NA 136 138  K 4.1 3.3*  CL 102 108  CO2 27 23  GLUCOSE 99 155*  BUN 7 8  CREATININE 0.65 0.73   CALCIUM 9.0 8.1*  MG  --  2.1   Estimated Creatinine Clearance: 82 mL/min (by C-G formula based on SCr of 0.73 mg/dL). Liver & Pancreas: No results for input(s): AST, ALT, ALKPHOS, BILITOT, PROT, ALBUMIN in the last 168 hours. No results for input(s): LIPASE, AMYLASE in the last 168 hours. No results for input(s): AMMONIA in the last 168 hours. Diabetic: No results for input(s): HGBA1C in the last 72 hours. No results for input(s): GLUCAP in the last 168 hours. Cardiac Enzymes: No results for input(s): CKTOTAL, CKMB, CKMBINDEX, TROPONINI in the last 168 hours. No results for input(s): PROBNP in the last 8760 hours. Coagulation Profile: No results for input(s): INR, PROTIME in the last 168 hours. Thyroid Function Tests: No results for input(s): TSH, T4TOTAL, FREET4, T3FREE, THYROIDAB in the last 72 hours. Lipid Profile: No results for input(s): CHOL, HDL, LDLCALC, TRIG, CHOLHDL, LDLDIRECT in the last 72 hours. Anemia Panel: Recent Labs  04/08/22 0907  VITAMINB12 568  FOLATE 14.4  FERRITIN 112  TIBC 284  IRON 36  RETICCTPCT 3.6*   Urine analysis:    Component Value Date/Time   COLORURINE STRAW (A) 04/07/2022 1717   APPEARANCEUR CLEAR 04/07/2022 1717   LABSPEC 1.004 (L) 04/07/2022 1717   PHURINE 7.0 04/07/2022 1717   GLUCOSEU NEGATIVE 04/07/2022 1717   HGBUR NEGATIVE 04/07/2022 1717   BILIRUBINUR NEGATIVE 04/07/2022 1717   KETONESUR NEGATIVE 04/07/2022 1717   PROTEINUR NEGATIVE 04/07/2022 1717   NITRITE NEGATIVE 04/07/2022 1717   LEUKOCYTESUR NEGATIVE 04/07/2022 1717   Sepsis Labs: Invalid input(s): PROCALCITONIN, [place]  Microbiology: Recent Results (from the past 240 hour(s))  Blood culture (routine x 2)     Status: None (Preliminary result)   Collection Time: 04/07/22  5:44 PM   Specimen: BLOOD  Result Value Ref Range Status   Specimen Description   Final    BLOOD SITE NOT SPECIFIED Performed at [facility], [address] [place], [place]  [address]    Special Requests   Final    BOTTLES DRAWN AEROBIC AND ANAEROBIC Blood Culture adequate volume Performed at [facility] [address] [place], [place] [address]    Culture   Final    NO GROWTH < 12 HOURS Performed at [facility] [address], [place], [place] [address]    Report Status PENDING  Incomplete  Resp Panel by RT-PCR (Flu A&B, Covid) Anterior Nasal Swab     Status: None   Collection Time: 04/07/22  6:01 PM   Specimen: Anterior Nasal Swab  Result Value Ref Range Status   SARS Coronavirus 2 by RT PCR NEGATIVE NEGATIVE Final    Comment: (NOTE) SARS-CoV-2 target nucleic acids are NOT DETECTED.  The SARS-CoV-2 RNA is generally detectable in upper respiratory specimens during the acute phase of infection. The lowest concentration of SARS-CoV-2 viral copies this assay can detect is 138 copies/mL. A negative result does not preclude SARS-Cov-2 infection and should not be used as the sole basis for treatment or other patient management decisions. A negative result may occur with  improper specimen collection/handling, submission of specimen other than nasopharyngeal swab, presence of viral mutation(s) within the areas targeted by this assay, and inadequate number of viral copies(<138 copies/mL). A negative result must be combined with clinical observations, patient history, and epidemiological information. The expected result is Negative.  Fact Sheet for Patients:  EntrepreneurPulse.com.au  Fact Sheet for Healthcare Providers:  IncredibleEmployment.be  This test is no t yet approved or cleared by the [place] FDA and  has been authorized for detection and/or diagnosis of SARS-CoV-2 by FDA under an Emergency Use Authorization (EUA). This EUA will remain  in effect (meaning this test can be used) for the duration of the COVID-19 declaration under Section 564(b)(1) of the Act, 21 U.S.C.section  360bbb-3(b)(1), unless the authorization is terminated  or revoked sooner.       Influenza A by PCR NEGATIVE NEGATIVE Final   Influenza B by PCR NEGATIVE NEGATIVE Final    Comment: (NOTE) The Xpert Xpress SARS-CoV-2/FLU/RSV plus assay is intended as an aid in the diagnosis of influenza from Nasopharyngeal swab specimens and should not be used as a sole basis for treatment. Nasal washings and aspirates are unacceptable for Xpert Xpress SARS-CoV-2/FLU/RSV testing.  Fact Sheet for Patients: EntrepreneurPulse.com.au  Fact Sheet for Healthcare Providers: IncredibleEmployment.be  This test is not yet approved or cleared by the [place] FDA and has been  authorized for detection and/or diagnosis of SARS-CoV-2 by FDA under an Emergency Use Authorization (EUA). This EUA will remain in effect (meaning this test can be used) for the duration of the COVID-19 declaration under Section 564(b)(1) of the Act, 21 U.S.C. section 360bbb-3(b)(1), unless the authorization is terminated or revoked.  Performed at [facility], [place] [address] [place], [place] [address]   Expectorated Sputum Assessment w Gram Stain, Rflx to Resp Cult     Status: None   Collection Time: 04/08/22  8:54 AM   Specimen: Expectorated Sputum  Result Value Ref Range Status   Specimen Description EXPECTORATED SPUTUM  Final   Special Requests NONE  Final   Sputum evaluation   Final    THIS SPECIMEN IS ACCEPTABLE FOR SPUTUM CULTURE Performed at [facility], [place] [address], [place], [place] [address]    Report Status 04/08/2022 FINAL  Final    Radiology Studies: CT Angio Chest PE W and/or Wo Contrast  Result Date: 04/07/2022 CLINICAL DATA:  Shortness of breath, pharyngitis and weakness for the past for days. Recent chemotherapy. Status post bilateral nipple sparing mastectomy and sentinel node biopsy for left breast cancer on 01/07/2022 with  implant reconstruction. 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 Previous mid chest radiation for non-Hodgkin's lymphoma in the 1980's. EXAM: CT ANGIOGRAPHY CHEST WITH CONTRAST TECHNIQUE: Multidetector CT imaging of the chest was performed using the standard protocol during bolus administration of intravenous contrast. Multiplanar CT image reconstructions and MIPs were obtained to evaluate the vascular anatomy. RADIATION DOSE REDUCTION: This exam was performed according to the departmental dose-optimization program which includes automated exposure control, adjustment of the mA and/or kV according to patient size and/or use of iterative reconstruction technique. CONTRAST:  52m OMNIPAQUE IOHEXOL 350 MG/ML SOLN COMPARISON:  Portable chest obtained earlier today. FINDINGS: Cardiovascular: Satisfactory opacification of the pulmonary arteries to the segmental level. No evidence of pulmonary embolism. Normal heart size. No pericardial effusion. Mediastinum/Nodes: Peripherally calcified exophytic nodule rising from the inferior aspect of the left lobe of the thyroid gland, extending into a substernal location. This measures 1.8 cm in maximum diameter on image number 45/7. No enlarged lymph nodes. Small hiatal hernia with fluid in the mid and distal esophagus. Lungs/Pleura: 7 x 6 mm noncalcified nodule in the left lower lobe on image number 56/8. 5 mm nodule in the medial aspect of the lingula on image number 80/8. 5 mm right lower lobe nodule on image number 82/8. Mild cylindrical bronchiectasis in the medial aspects of both upper lobes. Upper Abdomen: Unremarkable. Musculoskeletal: Bilateral breast implants mild thoracic and lower cervical spine degenerative changes. No evidence of bony metastatic disease. Review of the MIP images confirms the above findings. IMPRESSION: 1. No pulmonary emboli. 2. 3 small lung nodules, as  described above. The most suspicious is in the left lower lobe with a mean diameter of 6.5 mm. Given the history of bilateral invasive breast cancer, metastatic disease is a consideration. Therefore, recommend a follow-up chest CT with contrast in 3 months. This is probably too small to accurately characterize with PET-CT at this time. 3. Mild cylindrical bronchiectasis in the medial aspects of both upper lobes, most likely representing postradiation changes. 4. Small hiatal hernia with evidence of gastroesophageal reflux with associated fluid in the mid and distal esophagus. Electronically Signed   By: SClaudie ReveringM.D.   On: 04/07/2022 16:44   [facility]  [facility]  Result Date: 04/07/2022 CLINICAL DATA:  dib EXAM: PORTABLE CHEST 1 VIEW COMPARISON:  None Available. FINDINGS: No consolidation. No visible pleural effusions or pneumothorax. Cardiomediastinal silhouette is within normal limits. No displaced fracture. IMPRESSION: No evidence of acute cardiopulmonary disease. Electronically Signed   By: Margaretha Sheffield M.D.   On: 04/07/2022 14:09      Memorie Yokoyama T. [place]  If 7PM-7AM, please contact night-coverage [email or web address] 04/08/2022, 10:30 AM

## 2022-04-08 NOTE — Assessment & Plan Note (Signed)
Recent Labs    11/27/21 0817 02/11/22 1343 02/20/22 1144 03/06/22 1152 03/27/22 1118 04/07/22 1312 04/08/22 0259 04/09/22 0349  HGB 13.4 12.4 12.7 11.8* 11.5* 11.2* 10.1* 9.9*  H&H relatively stable but slowly dropping and ? Dilutional Drop.  Anemia panel basically normal. -Monitor in the outpatient setting and could be Anemia of Chronic Disease and related to Chemo -Follow up CBC within 1 week

## 2022-04-08 NOTE — Assessment & Plan Note (Signed)
Body mass index is 32.37 kg/m.

## 2022-04-09 DIAGNOSIS — D638 Anemia in other chronic diseases classified elsewhere: Secondary | ICD-10-CM | POA: Diagnosis not present

## 2022-04-09 DIAGNOSIS — J471 Bronchiectasis with (acute) exacerbation: Secondary | ICD-10-CM | POA: Diagnosis not present

## 2022-04-09 DIAGNOSIS — E876 Hypokalemia: Secondary | ICD-10-CM | POA: Diagnosis not present

## 2022-04-09 DIAGNOSIS — K449 Diaphragmatic hernia without obstruction or gangrene: Secondary | ICD-10-CM | POA: Diagnosis not present

## 2022-04-09 LAB — RENAL FUNCTION PANEL
Albumin: 3.2 g/dL — ABNORMAL LOW (ref 3.5–5.0)
Anion gap: 7 (ref 5–15)
BUN: 9 mg/dL (ref 6–20)
CO2: 23 mmol/L (ref 22–32)
Calcium: 8.6 mg/dL — ABNORMAL LOW (ref 8.9–10.3)
Chloride: 108 mmol/L (ref 98–111)
Creatinine, Ser: 0.72 mg/dL (ref 0.44–1.00)
GFR, Estimated: >60 mL/min (ref >60)
Glucose, Bld: 138 mg/dL — ABNORMAL HIGH (ref 70–99)
Phosphorus: 3.3 mg/dL (ref 2.5–4.6)
Potassium: 4.1 mmol/L (ref 3.5–5.1)
Sodium: 138 mmol/L (ref 135–145)

## 2022-04-09 LAB — CBC WITH DIFFERENTIAL/PLATELET
Abs Immature Granulocytes: 0.18 10*3/uL — ABNORMAL HIGH (ref 0.00–0.07)
Basophils Absolute: 0.1 10*3/uL (ref 0.0–0.1)
Basophils Relative: 2 %
Eosinophils Absolute: 0.1 10*3/uL (ref 0.0–0.5)
Eosinophils Relative: 1 %
HCT: 31.5 % — ABNORMAL LOW (ref 36.0–46.0)
Hemoglobin: 9.9 g/dL — ABNORMAL LOW (ref 12.0–15.0)
Immature Granulocytes: 4 %
Lymphocytes Relative: 30 %
Lymphs Abs: 1.3 10*3/uL (ref 0.7–4.0)
MCH: 30.5 pg (ref 26.0–34.0)
MCHC: 31.4 g/dL (ref 30.0–36.0)
MCV: 96.9 fL (ref 80.0–100.0)
Monocytes Absolute: 1.5 10*3/uL — ABNORMAL HIGH (ref 0.1–1.0)
Monocytes Relative: 36 %
Neutro Abs: 1.2 10*3/uL — ABNORMAL LOW (ref 1.7–7.7)
Neutrophils Relative %: 27 %
Platelets: 290 10*3/uL (ref 150–400)
RBC: 3.25 MIL/uL — ABNORMAL LOW (ref 3.87–5.11)
RDW: 15.8 % — ABNORMAL HIGH (ref 11.5–15.5)
WBC: 4.3 10*3/uL (ref 4.0–10.5)
nRBC: 0.7 % — ABNORMAL HIGH (ref 0.0–0.2)

## 2022-04-09 LAB — MAGNESIUM: Magnesium: 2.3 mg/dL (ref 1.7–2.4)

## 2022-04-09 MED ORDER — FLUTICASONE PROPIONATE 50 MCG/ACT NA SUSP: 2.0000 | Freq: Every day | NASAL | 2 refills | Status: DC

## 2022-04-09 MED ORDER — DOXYCYCLINE HYCLATE 100 MG PO TABS: 100.0000 mg | ORAL_TABLET | Freq: Two times a day (BID) | ORAL | Status: DC

## 2022-04-09 MED ORDER — ROPINIROLE HCL 0.5 MG PO TABS: 0.5000 mg | ORAL_TABLET | Freq: Every day | ORAL | 0 refills | Status: DC

## 2022-04-09 MED ORDER — MELATONIN 3 MG PO TABS
3.0000 mg | ORAL_TABLET | Freq: Once | ORAL | Status: AC
  Administered 2022-04-09: 3 mg via ORAL
  Filled 2022-04-09: qty 1

## 2022-04-09 MED ORDER — PHENOL 1.4 % MT LIQD: 1.0000 | OROMUCOSAL | 0 refills | Status: DC | PRN

## 2022-04-09 MED ORDER — DOXYCYCLINE HYCLATE 100 MG PO TABS
100.0000 mg | ORAL_TABLET | Freq: Two times a day (BID) | ORAL | 0 refills | Status: AC
Start: 2022-04-09 — End: 2022-04-14

## 2022-04-09 MED ORDER — SENNOSIDES-DOCUSATE SODIUM 8.6-50 MG PO TABS: 1.0000 | ORAL_TABLET | Freq: Every evening | ORAL | 0 refills | Status: DC | PRN

## 2022-04-09 MED ORDER — MAGIC MOUTHWASH W/LIDOCAINE: 5.0000 mL | Freq: Four times a day (QID) | ORAL | 0 refills | Status: DC | PRN

## 2022-04-09 MED ORDER — GUAIFENESIN ER 600 MG PO TB12: 600.0000 mg | ORAL_TABLET | Freq: Two times a day (BID) | ORAL | 0 refills | Status: AC

## 2022-04-09 NOTE — Progress Notes (Incomplete)
PROGRESS NOTE    Erika Hodge  YIR:[id number] DOB: 03-27-1966 DOA: 04/07/2022 PCP: Bonnita Nasuti, MD   Brief Narrative:  56 year old F with PMH of b/l breast cancer s/p b/l mastectomy and reconstruction in in 12/2021 on chemotherapy (last infusion 03/27/2022), NHL since age of 57, psoriasis, anxiety and depression presenting with SOB, DOE, productive cough, sore throat, fatigue, nausea and poor p.o. intake, and admitted for bronchiectasis and neutropenia.  CTA chest negative for PE but bronchiectasis in both upper lobes felt to be postradiation changes, and 3 small lung nodules.  ANC was 400.  Blood cultures obtained.  She was started on cefepime and hypertonic saline nebs.     Assessment and Plan: * Bronchiectasis with acute exacerbation [facility]) Patient presents with respiratory symptoms.  CTA chest concerning for bronchiectasis bilaterally.  Felt to be postradiation change.  She is neutropenic and febrile.  Respiratory symptoms better but significant sore throat with odynophagia.  She also reports postnasal drip and fullness in the ear.  Blood and respiratory culture pending.  Neutropenia improving -Check full RVP -Continue IV cefepime given neutropenia. -Continue saline nebs, incentive telemetry, flutter valve, mucolytic's -Claritin, Flonase nasal spray and Magic mouthwash with lidocaine  Neutropenic fever (HCC) Spiked fever to 100.6 last night.  ANC 400>> 800.  Likely from chemotherapy. -Continue IV cefepime.  Follow cultures and RVP  Malignant neoplasm of upper-inner quadrant of left breast in female, estrogen receptor positive ([place]) S/p b/l  mastectomy with reconstruction 01/07/2022. On chemotherapy.  Last dose 03/27/2022.  -Oncology added to treatment team  Pulmonary nodules 3 small lung nodules noted on CTA chest.  Repeat CT chest in 3 months recommended -Patient and patient's husband aware of this.  Obesity (BMI 30-39.9) Body mass index is 32.37 kg/m.   Restless leg  syndrome Ferritin is somewhat normal.   -Requip 0.5 mg at night  Anemia of chronic disease Recent Labs    11/27/21 0817 02/11/22 1343 02/20/22 1144 03/06/22 1152 03/27/22 1118 04/07/22 1312 04/08/22 0259  HGB 13.4 12.4 12.7 11.8* 11.5* 11.2* 10.1*  H&H relatively stable.  Anemia panel basically normal. -Monitor   Hypokalemia K3.3.  P.o. KCl 40x2  Hiatal hernia with GERD P.o. Protonix 40 mg twice daily  Anxiety Continue home Prozac and Xanax as needed.    Hypoalbuminemia  -Patient's Albumin Level is now 3.2   Normocytic Anemia -   ***  DVT prophylaxis: enoxaparin (LOVENOX) injection 40 mg Start: 04/07/22 2200    Code Status: DNR Family Communication:   Disposition Plan:  Level of care: Telemetry Status is: Inpatient {Inpatient:23812}    Consultants:  ***  Procedures:  ***  Antimicrobials:  Anti-infectives (From admission, onward)    Start     Dose/Rate Route Frequency Ordered Stop   04/08/22 0100  ceFEPIme (MAXIPIME) 2 g in sodium chloride 0.9 % 100 mL IVPB        2 g 200 mL/hr over 30 Minutes Intravenous Every 8 hours 04/07/22 1934     04/07/22 1715  ceFEPIme (MAXIPIME) 2 g in sodium chloride 0.9 % 100 mL IVPB        2 g 200 mL/hr over 30 Minutes Intravenous  Once 04/07/22 1706 04/07/22 1828        Subjective: ***  Objective: Vitals:   04/08/22 1530 04/08/22 2007 04/09/22 0536 04/09/22 0740  BP: 132/60 (!) 149/76 136/77   Pulse: (!) 103 98 87   Resp: '20 20 20   '$ Temp: 98.3 F (36.8 C) 98.2 F (36.8 C)  97.7 F (36.5 C)   TempSrc: Oral Oral Oral   SpO2: 98% 99% 98% 99%  Weight:      Height:        Intake/Output Summary (Last 24 hours) at 04/09/2022 0804 Last data filed at 04/09/2022 0600 Gross per 24 hour  Intake 2164.1 ml  Output --  Net 2164.1 ml   Filed Weights   04/07/22 2038  Weight: 82.9 kg    Examination: Physical Exam:  Constitutional: WN/WD, NAD and appears calm and comfortable Eyes: PERRL, lids and  conjunctivae normal, sclerae anicteric  ENMT: External Ears, Nose appear normal. Grossly normal hearing. Mucous membranes are moist. Posterior pharynx clear of any exudate or lesions. Normal dentition.  Neck: Appears normal, supple, no cervical masses, normal ROM, no appreciable thyromegaly Respiratory: Clear to auscultation bilaterally, no wheezing, rales, rhonchi or crackles. Normal respiratory effort and patient is not tachypenic. No accessory muscle use.  Cardiovascular: RRR, no murmurs / rubs / gallops. S1 and S2 auscultated. No extremity edema. 2+ pedal pulses. No carotid bruits.  Abdomen: Soft, non-tender, non-distended. No masses palpated. No appreciable hepatosplenomegaly. Bowel sounds positive.  GU: Deferred. Musculoskeletal: No clubbing / cyanosis of digits/nails. No joint deformity upper and lower extremities. Good ROM, no contractures. Normal strength and muscle tone.  Skin: No rashes, lesions, ulcers. No induration; Warm and dry.  Neurologic: CN 2-12 grossly intact with no focal deficits. Sensation intact in all 4 Extremities, DTR normal. Strength 5/5 in all 4. Romberg sign cerebellar reflexes not assessed.  Psychiatric: Normal judgment and insight. Alert and oriented x 3. Normal mood and appropriate affect.   Data Reviewed: I have personally reviewed following labs and imaging studies  CBC: Recent Labs  Lab 04/07/22 1312 04/08/22 0259 04/09/22 0349  WBC 3.7* 4.4 4.3  NEUTROABS 0.4* 0.8* 1.2*  HGB 11.2* 10.1* 9.9*  HCT 35.4* 31.7* 31.5*  MCV 95.9 96.6 96.9  PLT 395 360 676   Basic Metabolic Panel: Recent Labs  Lab 04/07/22 1312 04/08/22 0259 04/09/22 0349  NA 136 138 138  K 4.1 3.3* 4.1  CL 102 108 108  CO2 '27 23 23  '$ GLUCOSE 99 155* 138*  BUN '7 8 9  '$ CREATININE 0.65 0.73 0.72  CALCIUM 9.0 8.1* 8.6*  MG  --  2.1 2.3  PHOS  --   --  3.3   GFR: Estimated Creatinine Clearance: 82 mL/min (by C-G formula based on SCr of 0.72 mg/dL). Liver Function Tests: Recent  Labs  Lab 04/09/22 0349  ALBUMIN 3.2*   No results for input(s): LIPASE, AMYLASE in the last 168 hours. No results for input(s): AMMONIA in the last 168 hours. Coagulation Profile: No results for input(s): INR, PROTIME in the last 168 hours. Cardiac Enzymes: No results for input(s): CKTOTAL, CKMB, CKMBINDEX, TROPONINI in the last 168 hours. BNP (last 3 results) No results for input(s): PROBNP in the last 8760 hours. HbA1C: No results for input(s): HGBA1C in the last 72 hours. CBG: No results for input(s): GLUCAP in the last 168 hours. Lipid Profile: No results for input(s): CHOL, HDL, LDLCALC, TRIG, CHOLHDL, LDLDIRECT in the last 72 hours. Thyroid Function Tests: No results for input(s): TSH, T4TOTAL, FREET4, T3FREE, THYROIDAB in the last 72 hours. Anemia Panel: Recent Labs    04/08/22 0907  VITAMINB12 568  FOLATE 14.4  FERRITIN 112  TIBC 284  IRON 36  RETICCTPCT 3.6*   Sepsis Labs: Recent Labs  Lab 04/07/22 1801 04/07/22 2107  LATICACIDVEN 1.1 0.8    Recent Results (  from the past 240 hour(s))  Blood culture (routine x 2)     Status: None (Preliminary result)   Collection Time: 04/07/22  5:44 PM   Specimen: BLOOD  Result Value Ref Range Status   Specimen Description   Final    BLOOD SITE NOT SPECIFIED Performed at [facility], [address], [place], [place] [address]    Special Requests   Final    BOTTLES DRAWN AEROBIC AND ANAEROBIC Blood Culture adequate volume Performed at [facility] [address], [place], [place] [address]    Culture   Final    NO GROWTH < 12 HOURS Performed at [facility] [address], [place], [place] [address]    Report Status PENDING  Incomplete  Resp Panel by RT-PCR (Flu A&B, Covid) Anterior Nasal Swab     Status: None   Collection Time: 04/07/22  6:01 PM   Specimen: Anterior Nasal Swab  Result Value Ref Range Status   SARS Coronavirus 2 by RT PCR NEGATIVE NEGATIVE Final    Comment:  (NOTE) SARS-CoV-2 target nucleic acids are NOT DETECTED.  The SARS-CoV-2 RNA is generally detectable in upper respiratory specimens during the acute phase of infection. The lowest concentration of SARS-CoV-2 viral copies this assay can detect is 138 copies/mL. A negative result does not preclude SARS-Cov-2 infection and should not be used as the sole basis for treatment or other patient management decisions. A negative result may occur with  improper specimen collection/handling, submission of specimen other than nasopharyngeal swab, presence of viral mutation(s) within the areas targeted by this assay, and inadequate number of viral copies(<138 copies/mL). A negative result must be combined with clinical observations, patient history, and epidemiological information. The expected result is Negative.  Fact Sheet for Patients:  EntrepreneurPulse.com.au  Fact Sheet for Healthcare Providers:  IncredibleEmployment.be  This test is no t yet approved or cleared by the [place] FDA and  has been authorized for detection and/or diagnosis of SARS-CoV-2 by FDA under an Emergency Use Authorization (EUA). This EUA will remain  in effect (meaning this test can be used) for the duration of the COVID-19 declaration under Section 564(b)(1) of the Act, 21 U.S.C.section 360bbb-3(b)(1), unless the authorization is terminated  or revoked sooner.       Influenza A by PCR NEGATIVE NEGATIVE Final   Influenza B by PCR NEGATIVE NEGATIVE Final    Comment: (NOTE) The Xpert Xpress SARS-CoV-2/FLU/RSV plus assay is intended as an aid in the diagnosis of influenza from Nasopharyngeal swab specimens and should not be used as a sole basis for treatment. Nasal washings and aspirates are unacceptable for Xpert Xpress SARS-CoV-2/FLU/RSV testing.  Fact Sheet for Patients: EntrepreneurPulse.com.au  Fact Sheet for Healthcare  Providers: IncredibleEmployment.be  This test is not yet approved or cleared by the [place] FDA and has been authorized for detection and/or diagnosis of SARS-CoV-2 by FDA under an Emergency Use Authorization (EUA). This EUA will remain in effect (meaning this test can be used) for the duration of the COVID-19 declaration under Section 564(b)(1) of the Act, 21 U.S.C. section 360bbb-3(b)(1), unless the authorization is terminated or revoked.  Performed at [facility], [place] [address], [place], [place] [address]   Expectorated Sputum Assessment w Gram Stain, Rflx to Resp Cult     Status: None   Collection Time: 04/08/22  8:54 AM   Specimen: Expectorated Sputum  Result Value Ref Range Status   Specimen Description EXPECTORATED SPUTUM  Final  Special Requests NONE  Final   Sputum evaluation   Final    THIS SPECIMEN IS ACCEPTABLE FOR SPUTUM CULTURE Performed at [facility] [address], [place], [place] [address]    Report Status 04/08/2022 FINAL  Final  Culture, Respiratory w Gram Stain     Status: None (Preliminary result)   Collection Time: 04/08/22  8:54 AM  Result Value Ref Range Status   Specimen Description   Final    EXPECTORATED SPUTUM Performed at [place] [address], [place], [place] [address]    Special Requests   Final    NONE Reflexed from [id number] Performed at [facility], [place] [address] [place], [place] [address]    Gram Stain   Final    NO WBC SEEN RARE GRAM POSITIVE COCCI IN CLUSTERS RARE GRAM POSITIVE RODS RARE GRAM NEGATIVE COCCI Performed at [facility], [place] [address], [place], [place] [address]    Culture PENDING  Incomplete   Report Status PENDING  Incomplete  Respiratory (~20 pathogens) panel by PCR     Status: None   Collection Time: 04/08/22  9:14 AM   Specimen: Nasopharyngeal Swab; Respiratory  Result Value Ref Range Status    Adenovirus NOT DETECTED NOT DETECTED Final   Coronavirus 229E NOT DETECTED NOT DETECTED Final    Comment: (NOTE) The Coronavirus on the Respiratory Panel, DOES NOT test for the novel  Coronavirus (2019 nCoV)    Coronavirus HKU1 NOT DETECTED NOT DETECTED Final   Coronavirus NL63 NOT DETECTED NOT DETECTED Final   Coronavirus OC43 NOT DETECTED NOT DETECTED Final   Metapneumovirus NOT DETECTED NOT DETECTED Final   Rhinovirus / Enterovirus NOT DETECTED NOT DETECTED Final   Influenza A NOT DETECTED NOT DETECTED Final   Influenza B NOT DETECTED NOT DETECTED Final   Parainfluenza Virus 1 NOT DETECTED NOT DETECTED Final   Parainfluenza Virus 2 NOT DETECTED NOT DETECTED Final   Parainfluenza Virus 3 NOT DETECTED NOT DETECTED Final   Parainfluenza Virus 4 NOT DETECTED NOT DETECTED Final   Respiratory Syncytial Virus NOT DETECTED NOT DETECTED Final   Bordetella pertussis NOT DETECTED NOT DETECTED Final   Bordetella Parapertussis NOT DETECTED NOT DETECTED Final   Chlamydophila pneumoniae NOT DETECTED NOT DETECTED Final   Mycoplasma pneumoniae NOT DETECTED NOT DETECTED Final    Comment: Performed at [facility], [place], [place], [place] [address]     Radiology Studies: CT Angio Chest PE W and/or Wo Contrast  Result Date: 04/07/2022 CLINICAL DATA:  Shortness of breath, pharyngitis and weakness for the past for days. Recent chemotherapy. Status post bilateral nipple sparing mastectomy and sentinel node biopsy for left breast cancer on 01/07/2022 with implant reconstruction. 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 Previous mid chest radiation for non-Hodgkin's lymphoma in the 1980's. EXAM: CT ANGIOGRAPHY CHEST WITH CONTRAST TECHNIQUE: Multidetector CT imaging of the chest was performed using the standard protocol during bolus administration of intravenous contrast. Multiplanar CT image  reconstructions and MIPs were obtained to evaluate the vascular anatomy. RADIATION DOSE REDUCTION: This exam was performed according to the departmental dose-optimization program which includes automated exposure control, adjustment of the mA and/or kV according to patient size and/or use of iterative reconstruction technique. CONTRAST:  59m OMNIPAQUE IOHEXOL 350 MG/ML SOLN COMPARISON:  Portable chest obtained earlier today. FINDINGS: Cardiovascular: Satisfactory opacification of the pulmonary arteries to the  segmental level. No evidence of pulmonary embolism. Normal heart size. No pericardial effusion. Mediastinum/Nodes: Peripherally calcified exophytic nodule rising from the inferior aspect of the left lobe of the thyroid gland, extending into a substernal location. This measures 1.8 cm in maximum diameter on image number 45/7. No enlarged lymph nodes. Small hiatal hernia with fluid in the mid and distal esophagus. Lungs/Pleura: 7 x 6 mm noncalcified nodule in the left lower lobe on image number 56/8. 5 mm nodule in the medial aspect of the lingula on image number 80/8. 5 mm right lower lobe nodule on image number 82/8. Mild cylindrical bronchiectasis in the medial aspects of both upper lobes. Upper Abdomen: Unremarkable. Musculoskeletal: Bilateral breast implants mild thoracic and lower cervical spine degenerative changes. No evidence of bony metastatic disease. Review of the MIP images confirms the above findings. IMPRESSION: 1. No pulmonary emboli. 2. 3 small lung nodules, as described above. The most suspicious is in the left lower lobe with a mean diameter of 6.5 mm. Given the history of bilateral invasive breast cancer, metastatic disease is a consideration. Therefore, recommend a follow-up chest CT with contrast in 3 months. This is probably too small to accurately characterize with PET-CT at this time. 3. Mild cylindrical bronchiectasis in the medial aspects of both upper lobes, most likely representing  postradiation changes. 4. Small hiatal hernia with evidence of gastroesophageal reflux with associated fluid in the mid and distal esophagus. Electronically Signed   By: Claudie Revering M.D.   On: 04/07/2022 16:44   DG Chest Port 1 View  Result Date: 04/07/2022 CLINICAL DATA:  dib EXAM: PORTABLE CHEST 1 VIEW COMPARISON:  None Available. FINDINGS: No consolidation. No visible pleural effusions or pneumothorax. Cardiomediastinal silhouette is within normal limits. No displaced fracture. IMPRESSION: No evidence of acute cardiopulmonary disease. Electronically Signed   By: Margaretha Sheffield M.D.   On: 04/07/2022 14:09     Scheduled Meds:  enoxaparin (LOVENOX) injection  40 mg Subcutaneous Q24H   FLUoxetine  20 mg Oral Daily   fluticasone  2 spray Each Nare Daily   guaiFENesin  600 mg Oral BID   magic mouthwash w/lidocaine  5 mL Oral QID   pantoprazole  40 mg Oral BID   rOPINIRole  0.5 mg Oral QHS   sodium chloride flush  3 mL Intravenous Q12H   sodium chloride HYPERTONIC  4 mL Nebulization BID   Continuous Infusions:  ceFEPime (MAXIPIME) IV 2 g (04/09/22 0041)     LOS: 1 day    Time spent: *** minutes spent on chart review, discussion with nursing staff, consultants, updating family and interview/physical exam; more than 50% of that time was spent in counseling and/or coordination of care.   Kerney Elbe, DO [facility] Available via Epic secure chat 7am-7pm After these hours, please refer to coverage provider listed on [email or web address] 04/09/2022, 8:04 AM

## 2022-04-09 NOTE — Progress Notes (Signed)
Discharge instructions and printed prescriptions provided to and reviewed with patient and patient's husband.  Both verbalized understanding.  PIVs and cardiac monitoring removed.  Patient escorted to main entrance with belongings for transport home with husband.  Angie Fava, RN

## 2022-04-09 NOTE — Plan of Care (Signed)

## 2022-04-09 NOTE — TOC Transition Note (Signed)
Transition of Care [facility]) - CM/SW Discharge Note   Patient Details  Name: Erika Hodge MRN: [id number] Date of Birth: 1966-10-22  [facility] of [facility]) CM/SW Contact:  Leeroy Cha, RN Phone Number: 04/09/2022, 12:40 PM   Clinical Narrative:    Nelly Rout eturn home with no toc needs.   Final next level of care: Home/Self Care Barriers to Discharge: No Barriers Identified   Patient Goals and CMS Choice Patient states their goals for this hospitalization and ongoing recovery are:: to return to my home and be well CMS Medicare.gov Compare Post Acute Care list provided to:: Patient    Discharge Placement                       Discharge Plan and Services   Discharge Planning Services: CM Consult                                 Social Determinants of Health (SDOH) Interventions     Readmission Risk Interventions     View : No data to display.

## 2022-04-09 NOTE — Progress Notes (Signed)
Nutrition Brief Note  Consult received for poor PO intake.   Wt Readings from Last 15 Encounters:  04/07/22 82.9 kg  03/27/22 81 kg  03/06/22 79.6 kg  02/20/22 78.5 kg  02/12/22 78.4 kg  01/24/22 80.3 kg  01/07/22 79.7 kg  11/27/21 79 kg    Body mass index is 32.37 kg/m. Patient meets criteria for obesity based on current BMI. No information documented in the edema section of flow sheet. Skin WDL.   Current diet order is Regular. She consumed 80% of breakfast, 100% of lunch, and 80% of dinner on 5/30 (total of 1568 kcal and 51 grams protein) and 100% of breakfast today (597 kcal and 33 grams protein).  Labs and medications reviewed.   Patient sitting up on window seat. She shares that she is going home later today. Patient denies poor appetite and states "I'm eating fine." She confirms oral/swallowing pain and that she plans to consume soft foods after returning home. Encouraged items such as milkshakes to increase kcal and protein intake and which may be soothing for pain.   She denies any nutrition-related questions or concerns for returning home. Patient aware to let RN know if questions or concerns arise and RD will gladly return to assist.   Patient with hx of bilateral breast cancer s/p bilateral mastectomy and reconstruction in 12/2021 undergoing chemo (last: 03/27/22).   No nutrition interventions warranted at this time. If nutrition issues arise, please re-consult RD.       Jarome Matin, MS, RD, LDN Registered Dietitian II Inpatient Clinical Nutrition RD pager # and on-call/weekend pager # available in [facility]

## 2022-04-09 NOTE — Discharge Summary (Signed)
Physician Discharge Summary   Patient: Erika Hodge MRN: [id number] DOB: 1966/05/10  Admit date:     04/07/2022  Discharge date: 04/09/22  Discharge Physician: Raiford Noble, DO   PCP: Bonnita Nasuti, MD   Recommendations at discharge:   Follow up with PCP within 1-2 weeks and repeat CBC,CMP,Mag Phos within 1 week Follow up with Medical Oncology within 1-2 weeks Follow up with Pulmonary in the outpatient setting; Appointment scheduled for 04/16/2022 with Dr. Erin Fulling for 3:30 pm and follow up final SPUTUM Cx obtained while hospitalized  Repeat CXR in 1-2 weeks   Discharge Diagnoses: Principal Problem:   Bronchiectasis with acute exacerbation ([place]) Active Problems:   Malignant neoplasm of upper-inner quadrant of left breast in female, estrogen receptor positive ([place])   Neutropenic fever ([place])   Pulmonary nodules   Anxiety   Hiatal hernia with GERD   Hypokalemia   Anemia of chronic disease   Restless leg syndrome   Obesity (BMI 30-39.9)  Resolved Problems:   * No resolved hospital problems. *  Hospital Course: 56 year old F with PMH of b/l breast cancer s/p b/l mastectomy and reconstruction in in 12/2021 on chemotherapy (last infusion 03/27/2022), NHL since age of 33, psoriasis, anxiety and depression presenting with SOB, DOE, productive cough, sore throat, fatigue, nausea and poor p.o. intake, and admitted for bronchiectasis and neutropenia.  CTA chest negative for PE but bronchiectasis in both upper lobes felt to be postradiation changes, and 3 small lung nodules.  ANC was 400.  Blood cultures obtained.  She was started on cefepime and hypertonic saline nebs.   Her cefepime was changed to p.o. doxycycline and she is improved.  She is feels much better and she has been afebrile for last 24 hours and ANC is improving.  Medical oncology recommended outpatient follow-up and we will however refer to pulmonary for further evaluation of her lung nodules.  She is medically stable to be  discharged at this time.  Assessment and Plan: * Bronchiectasis with acute exacerbation ([place]) -Patient presents with respiratory symptoms.  CTA chest concerning for bronchiectasis bilaterally.   -Felt to be postradiation change.  She is neutropenic and febrile.   -Respiratory symptoms better but significant sore throat with odynophagia.  She also reports postnasal drip and fullness in the ear.  Blood and respiratory culture Negative.  Neutropenia improving -Check full RVP -Continue IV cefepime given neutropenia and change to po Doxy x5 days -Continue saline nebs, incentive telemetry, flutter valve, mucolytic's -Claritin, Flonase nasal spray and Magic mouthwash with lidocaine at D/C -Follow up with Oncology and Pulmonary appointment set up for next week  -Repeat CXR in 1-2 weeks   Neutropenic fever ([place]) -Spiked fever to 100.6 the night before last.  ANC 400>> 800 -> 1200.  Likely from chemotherapy. -Continued IV cefepime and changed to po Doxycycline x5 more days.  Follow cultures and RVP -Respiratory virus panel was negative cultures x2 showed no growth to date -Sputum Cx done and showed  Gram Stain NO WBC SEEN  RARE GRAM POSITIVE COCCI IN CLUSTERS  RARE GRAM POSITIVE RODS  RARE GRAM NEGATIVE COCCI   Culture FEW STAPHYLOCOCCUS AUREUS  SUSCEPTIBILITIES TO FOLLOW   -Initiated on doxycycline can follow-up in outpatient setting with pulmonary and change as necessary  Malignant neoplasm of upper-inner quadrant of left breast in female, estrogen receptor positive ([place]) -S/p b/l  mastectomy with reconstruction 01/07/2022. On chemotherapy.   -Last dose 03/27/2022.  -Oncology added to treatment team and I spoke with Dr. Burr Medico  who will follow up with the patient in outpatient setting.  Dr. Burr Medico is out of the country and states that she will need to be seen by one of her partners in the coming weeks  Pulmonary nodules -3 small lung nodules noted on CTA chest.  -Repeat CT chest in 3 months  recommended -Patient and patient's husband aware of this and pulmonary referral has been made and she has an appointment on 04/16/2022 with Dr. Erin Fulling at 3:30 PM  Obesity (BMI 16-10.9) -Complicates overall prognosis and care -Estimated body mass index is 32.37 kg/m as calculated from the following:   Height as of this encounter: '5\' 3"'$  (1.6 m).   Weight as of this encounter: 82.9 kg.  -Weight Loss and Dietary Counseling given   Restless leg syndrome -Ferritin is somewhat normal.   -Initiated Requip 0.5 mg at night which we will continue at discharge  Anemia of chronic disease Recent Labs    11/27/21 0817 02/11/22 1343 02/20/22 1144 03/06/22 1152 03/27/22 1118 04/07/22 1312 04/08/22 0259 04/09/22 0349  HGB 13.4 12.4 12.7 11.8* 11.5* 11.2* 10.1* 9.9*  H&H relatively stable but slowly dropping and ? Dilutional Drop.  Anemia panel basically normal. -Monitor in the outpatient setting and could be Anemia of Chronic Disease and related to Chemo -Follow up CBC within 1 week    Hypokalemia - Potassium is improved and gone from 3.3 is now 4.1 -Continue monitor and replete as necessary -Repeat CMP within a week  Hiatal hernia with GERD -C/w P.o. Protonix 40 mg twice daily  Anxiety -Continue home Prozac and Xanax as needed.  Pain control - [employer] Controlled Substance Reporting System database was reviewed. and patient was instructed, not to drive, operate heavy machinery, perform activities at heights, swimming or participation in water activities or provide baby-sitting services while on Pain, Sleep and Anxiety Medications; until their outpatient Physician has advised to do so again. Also recommended to not to take more than prescribed Pain, Sleep and Anxiety Medications.   Consultants: Discussed with Oncology  Procedures performed: None   Disposition: Home Diet recommendation:  Cardiac diet DISCHARGE MEDICATION: Allergies as of 04/09/2022       Reactions   Amoxicillin  Rash, Other (See Comments)   Throat closing. Tolerates Cefepime   Ibuprofen Other (See Comments), Nausea And Vomiting, Nausea Only   Ciprofloxacin Other (See Comments)   Facial swelling and throat swelling   Codeine Nausea Only        Medication List     TAKE these medications    acetaminophen 500 MG tablet Commonly known as: TYLENOL Take 1,000 mg by mouth every 6 (six) hours as needed for mild pain or fever.   ALPRAZolam 0.5 MG tablet Commonly known as: XANAX Take 0.5 mg by mouth daily as needed for anxiety or sleep.   ASHWAGANDHA PO Take 1 tablet by mouth daily.   dexamethasone 4 MG tablet Commonly known as: DECADRON Take 1 tablet (4 mg total) by mouth 2 (two) times daily. Start the day before Taxotere. Then change to one tablet daily after chemo for 3 days.   doxycycline 100 MG tablet Commonly known as: VIBRA-TABS Take 1 tablet (100 mg total) by mouth every 12 (twelve) hours for 5 days.   Enstilar 0.005-0.064 % Foam Generic drug: Calcipotriene-Betameth Diprop Apply 1 application topically as needed (apply to psoriasis area as needed).   FLUoxetine 20 MG capsule Commonly known as: PROZAC Take 20 mg by mouth daily.   fluticasone 50 MCG/ACT nasal spray  Commonly known as: FLONASE Place 2 sprays into both nostrils daily. Start taking on: April 10, 2022   guaiFENesin 600 MG 12 hr tablet Commonly known as: MUCINEX Take 1 tablet (600 mg total) by mouth 2 (two) times daily for 5 days.   loratadine 10 MG tablet Commonly known as: CLARITIN Take 10 mg by mouth daily.   magic mouthwash w/lidocaine Soln Take 5 mLs by mouth 4 (four) times daily as needed for mouth pain.   omeprazole 40 MG capsule Commonly known as: PRILOSEC Take 40 mg by mouth daily.   ondansetron 8 MG tablet Commonly known as: Zofran Take 1 tablet (8 mg total) by mouth 2 (two) times daily as needed for refractory nausea / vomiting. Start on day 3 after chemo.   phenol 1.4 % Liqd Commonly known  as: CHLORASEPTIC Use as directed 1 spray in the mouth or throat as needed for throat irritation / pain.   prochlorperazine 10 MG tablet Commonly known as: COMPAZINE Take 1 tablet (10 mg total) by mouth every 6 (six) hours as needed (Nausea or vomiting).   rOPINIRole 0.5 MG tablet Commonly known as: REQUIP Take 1 tablet (0.5 mg total) by mouth at bedtime.   senna-docusate 8.6-50 MG tablet Commonly known as: Senokot-S Take 1 tablet by mouth at bedtime as needed for mild constipation.   zolpidem 10 MG tablet Commonly known as: AMBIEN Take 10 mg by mouth at bedtime as needed for sleep.        Follow-up Information     Hague, Rosalyn Charters, MD. Call.   Specialty: Internal Medicine Why: Follow up within 1-2 weeks Contact information: [address] [place] [address] [phone]         Freddi Starr, MD. Call.   Specialty: Pulmonary Disease Why: Follow up within 1-2 weeks; Appointment scheduled for 04/16/22 at 3:30 pm Contact information: [address] [place] [address] [phone]         Truitt Merle, MD. Call.   Specialties: Hematology, Oncology Why: Follow up within 1-2 weeks Contact information: [place] [address] [phone]                Discharge Exam: Danley Danker Weights   04/07/22 2038  Weight: 82.9 kg   Vitals:   04/09/22 0536 04/09/22 0740  BP: 136/77   Pulse: 87   Resp: 20   Temp: 97.7 F (36.5 C)   SpO2: 98% 99%   Examination: Physical Exam:  Constitutional: WN/WD obese Caucasian female in NAD Respiratory: Diminished to auscultation bilaterally, no wheezing, rales, rhonchi or crackles. Normal respiratory effort and patient is not tachypenic. No accessory muscle use.  Cardiovascular: RRR, no murmurs / rubs / gallops. S1 and S2 auscultated.   Abdomen: Soft, non-tender, Distended. Bowel sounds positive.  GU: Deferred. Musculoskeletal: No clubbing / cyanosis of digits/nails.  No joint deformity upper and lower extremities.  Neurologic: CN 2-12 grossly intact with no focal deficits. Romberg sign and cerebellar reflexes not assessed.  Psychiatric: Normal judgment and insight. Alert and oriented x 3. Normal mood and appropriate affect.   Condition at discharge: stable  The results of significant diagnostics from this hospitalization (including imaging, microbiology, ancillary and laboratory) are listed below for reference.   Imaging Studies: CT Angio Chest PE W and/or Wo Contrast  Result Date: 04/07/2022 CLINICAL DATA:  Shortness of breath, pharyngitis and weakness for the past for days. Recent chemotherapy. Status post bilateral nipple sparing mastectomy and sentinel node biopsy  for left breast cancer on 01/07/2022 with implant reconstruction. 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 Previous mid chest radiation for non-Hodgkin's lymphoma in the 1980's. EXAM: CT ANGIOGRAPHY CHEST WITH CONTRAST TECHNIQUE: Multidetector CT imaging of the chest was performed using the standard protocol during bolus administration of intravenous contrast. Multiplanar CT image reconstructions and MIPs were obtained to evaluate the vascular anatomy. RADIATION DOSE REDUCTION: This exam was performed according to the departmental dose-optimization program which includes automated exposure control, adjustment of the mA and/or kV according to patient size and/or use of iterative reconstruction technique. CONTRAST:  13m OMNIPAQUE IOHEXOL 350 MG/ML SOLN COMPARISON:  Portable chest obtained earlier today. FINDINGS: Cardiovascular: Satisfactory opacification of the pulmonary arteries to the segmental level. No evidence of pulmonary embolism. Normal heart size. No pericardial effusion. Mediastinum/Nodes: Peripherally calcified exophytic nodule rising from the inferior aspect of the left lobe of the thyroid  gland, extending into a substernal location. This measures 1.8 cm in maximum diameter on image number 45/7. No enlarged lymph nodes. Small hiatal hernia with fluid in the mid and distal esophagus. Lungs/Pleura: 7 x 6 mm noncalcified nodule in the left lower lobe on image number 56/8. 5 mm nodule in the medial aspect of the lingula on image number 80/8. 5 mm right lower lobe nodule on image number 82/8. Mild cylindrical bronchiectasis in the medial aspects of both upper lobes. Upper Abdomen: Unremarkable. Musculoskeletal: Bilateral breast implants mild thoracic and lower cervical spine degenerative changes. No evidence of bony metastatic disease. Review of the MIP images confirms the above findings. IMPRESSION: 1. No pulmonary emboli. 2. 3 small lung nodules, as described above. The most suspicious is in the left lower lobe with a mean diameter of 6.5 mm. Given the history of bilateral invasive breast cancer, metastatic disease is a consideration. Therefore, recommend a follow-up chest CT with contrast in 3 months. This is probably too small to accurately characterize with PET-CT at this time. 3. Mild cylindrical bronchiectasis in the medial aspects of both upper lobes, most likely representing postradiation changes. 4. Small hiatal hernia with evidence of gastroesophageal reflux with associated fluid in the mid and distal esophagus. Electronically Signed   By: SClaudie ReveringM.D.   On: 04/07/2022 16:44   DG Chest Port 1 View  Result Date: 04/07/2022 CLINICAL DATA:  dib EXAM: PORTABLE CHEST 1 VIEW COMPARISON:  None Available. FINDINGS: No consolidation. No visible pleural effusions or pneumothorax. Cardiomediastinal silhouette is within normal limits. No displaced fracture. IMPRESSION: No evidence of acute cardiopulmonary disease. Electronically Signed   By: FMargaretha SheffieldM.D.   On: 04/07/2022 14:09    Microbiology: Results for orders placed or performed during the hospital encounter of 04/07/22  Blood  culture (routine x 2)     Status: None (Preliminary result)   Collection Time: 04/07/22  5:44 PM   Specimen: BLOOD  Result Value Ref Range Status   Specimen Description   Final    BLOOD SITE NOT SPECIFIED Performed at [id number], [place] [id number]   Special Requests   Final    BOTTLES DRAWN AEROBIC AND ANAEROBIC Blood Culture adequate volume Performed at [id number], [place] [address]   Culture   Final    NO GROWTH 2 DAYS Performed at [facility] [place] [address]   Report Status PENDING  Incomplete  Resp Panel by RT-PCR (Flu A&B, Covid) Anterior Nasal Swab     Status: None   Collection Time: 04/07/22  6:01 PM   Specimen: Anterior Nasal Swab  Result Value Ref Range Status   SARS Coronavirus 2 by RT PCR NEGATIVE NEGATIVE Final    Comment: (NOTE) SARS-CoV-2 target nucleic acids are NOT DETECTED.  The SARS-CoV-2 RNA is generally detectable in upper respiratory specimens during the acute phase of infection. The lowest concentration of SARS-CoV-2 viral copies this assay can detect is 138 copies/mL. A negative result does not preclude SARS-Cov-2 infection and should not be used as the sole basis for treatment or other patient management decisions. A negative result may occur with  improper specimen collection/handling, submission of specimen other than nasopharyngeal swab, presence of viral mutation(s) within the areas targeted by this assay, and inadequate number of viral copies(<138 copies/mL). A negative result must be combined with clinical observations, patient history, and epidemiological information. The expected result is Negative.  Fact Sheet for Patients:  EntrepreneurPulse.com.au  Fact Sheet for Healthcare Providers:  IncredibleEmployment.be  This test is no t yet approved or cleared by the [place] FDA and  has  been authorized for detection and/or diagnosis of SARS-CoV-2 by FDA under an Emergency Use Authorization (EUA). This EUA will remain  in effect (meaning this test can be used) for the duration of the COVID-19 declaration under Section 564(b)(1) of the Act, 21 U.S.C.section 360bbb-3(b)(1), unless the authorization is terminated  or revoked sooner.       Influenza A by PCR NEGATIVE NEGATIVE Final   Influenza B by PCR NEGATIVE NEGATIVE Final    Comment: (NOTE) The Xpert Xpress SARS-CoV-2/FLU/RSV plus assay is intended as an aid in the diagnosis of influenza from Nasopharyngeal swab specimens and should not be used as a sole basis for treatment. Nasal washings and aspirates are unacceptable for Xpert Xpress SARS-CoV-2/FLU/RSV testing.  Fact Sheet for Patients: EntrepreneurPulse.com.au  Fact Sheet for Healthcare Providers: IncredibleEmployment.be  This test is not yet approved or cleared by the [place] FDA and has been authorized for detection and/or diagnosis of SARS-CoV-2 by FDA under an Emergency Use Authorization (EUA). This EUA will remain in effect (meaning this test can be used) for the duration of the COVID-19 declaration under Section 564(b)(1) of the Act, 21 U.S.C. section 360bbb-3(b)(1), unless the authorization is terminated or revoked.  Performed at [facility], [place] [address] [place], [place] [address]   Expectorated Sputum Assessment w Gram Stain, Rflx to Resp Cult     Status: None   Collection Time: 04/08/22  8:54 AM   Specimen: Expectorated Sputum  Result Value Ref Range Status   Specimen Description EXPECTORATED SPUTUM  Final   Special Requests NONE  Final   Sputum evaluation   Final    THIS SPECIMEN IS ACCEPTABLE FOR SPUTUM CULTURE Performed at [facility], [place] [address] [place], [place] [address]    Report Status 04/08/2022 FINAL  Final  Culture, Respiratory w Gram Stain      Status: None (Preliminary result)   Collection Time: 04/08/22  8:54 AM  Result Value Ref Range Status   Specimen Description   Final    EXPECTORATED SPUTUM Performed at [place] [address], [place], [place] [address]    Special Requests   Final    NONE Reflexed from [id number] Performed at [facility], [place] [address], [place], [place] [address]    Gram Stain  Final    NO WBC SEEN RARE GRAM POSITIVE COCCI IN CLUSTERS RARE GRAM POSITIVE RODS RARE GRAM NEGATIVE COCCI    Culture   Final    FEW STAPHYLOCOCCUS AUREUS SUSCEPTIBILITIES TO FOLLOW Performed at [facility], [place] [address], [place], [place] [address]    Report Status PENDING  Incomplete  Respiratory (~20 pathogens) panel by PCR     Status: None   Collection Time: 04/08/22  9:14 AM   Specimen: Nasopharyngeal Swab; Respiratory  Result Value Ref Range Status   Adenovirus NOT DETECTED NOT DETECTED Final   Coronavirus 229E NOT DETECTED NOT DETECTED Final    Comment: (NOTE) The Coronavirus on the Respiratory Panel, DOES NOT test for the novel  Coronavirus (2019 nCoV)    Coronavirus HKU1 NOT DETECTED NOT DETECTED Final   Coronavirus NL63 NOT DETECTED NOT DETECTED Final   Coronavirus OC43 NOT DETECTED NOT DETECTED Final   Metapneumovirus NOT DETECTED NOT DETECTED Final   Rhinovirus / Enterovirus NOT DETECTED NOT DETECTED Final   Influenza A NOT DETECTED NOT DETECTED Final   Influenza B NOT DETECTED NOT DETECTED Final   Parainfluenza Virus 1 NOT DETECTED NOT DETECTED Final   Parainfluenza Virus 2 NOT DETECTED NOT DETECTED Final   Parainfluenza Virus 3 NOT DETECTED NOT DETECTED Final   Parainfluenza Virus 4 NOT DETECTED NOT DETECTED Final   Respiratory Syncytial Virus NOT DETECTED NOT DETECTED Final   Bordetella pertussis NOT DETECTED NOT DETECTED Final   Bordetella Parapertussis NOT DETECTED NOT DETECTED Final   Chlamydophila pneumoniae NOT DETECTED NOT DETECTED Final    Mycoplasma pneumoniae NOT DETECTED NOT DETECTED Final    Comment: Performed at [facility], [place]. [address] [place], [place] [address]    Labs: CBC: Recent Labs  Lab 04/07/22 1312 04/08/22 0259 04/09/22 0349  WBC 3.7* 4.4 4.3  NEUTROABS 0.4* 0.8* 1.2*  HGB 11.2* 10.1* 9.9*  HCT 35.4* 31.7* 31.5*  MCV 95.9 96.6 96.9  PLT 395 360 950   Basic Metabolic Panel: Recent Labs  Lab 04/07/22 1312 04/08/22 0259 04/09/22 0349  NA 136 138 138  K 4.1 3.3* 4.1  CL 102 108 108  CO2 '27 23 23  '$ GLUCOSE 99 155* 138*  BUN '7 8 9  '$ CREATININE 0.65 0.73 0.72  CALCIUM 9.0 8.1* 8.6*  MG  --  2.1 2.3  PHOS  --   --  3.3   Liver Function Tests: Recent Labs  Lab 04/09/22 0349  ALBUMIN 3.2*   CBG: No results for input(s): GLUCAP in the last 168 hours.  Discharge time spent: greater than 30 minutes.  Signed: Raiford Noble, DO [facility] 04/09/2022

## 2022-04-10 LAB — CULTURE, RESPIRATORY W GRAM STAIN: Gram Stain: NONE SEEN

## 2022-04-12 LAB — CULTURE, BLOOD (ROUTINE X 2)
Culture: NO GROWTH
Special Requests: ADEQUATE

## 2022-04-14 ENCOUNTER — Ambulatory Visit: Payer: BC Managed Care – PPO | Attending: Surgery | Admitting: Physical Therapy

## 2022-04-14 ENCOUNTER — Telehealth: Payer: Self-pay

## 2022-04-14 ENCOUNTER — Encounter: Payer: Self-pay | Admitting: Physical Therapy

## 2022-04-14 DIAGNOSIS — Z483 Aftercare following surgery for neoplasm: Secondary | ICD-10-CM | POA: Insufficient documentation

## 2022-04-14 DIAGNOSIS — R293 Abnormal posture: Secondary | ICD-10-CM | POA: Insufficient documentation

## 2022-04-14 DIAGNOSIS — C50212 Malignant neoplasm of upper-inner quadrant of left female breast: Secondary | ICD-10-CM | POA: Insufficient documentation

## 2022-04-14 DIAGNOSIS — Z170 Estrogen receptor positive status [ER+]: Secondary | ICD-10-CM | POA: Insufficient documentation

## 2022-04-14 NOTE — Therapy (Signed)
OUTPATIENT PHYSICAL THERAPY SOZO SCREENING NOTE   Patient Name: Erika Hodge MRN: [id number] DOB:[birth year], 56 y.o., female Today's Date: 04/14/2022  PCP: Bonnita Nasuti, MD REFERRING PROVIDER: Erroll Luna, MD   PT End of Session - 04/14/22 1412     Visit Number 12    Number of Visits 15    PT Start Time 1405    PT Stop Time 1412    PT Time Calculation (min) 7 min    Activity Tolerance Patient tolerated treatment well    Behavior During Therapy [facility] for tasks assessed/performed             Past Medical History:  Diagnosis Date   Anxiety    Complication of anesthesia    HA   Non Hodgkin's lymphoma ([place])    Psoriasis    Past Surgical History:  Procedure Laterality Date   BREAST BIOPSY Left 11/20/2021   BREAST RECONSTRUCTION WITH PLACEMENT OF TISSUE EXPANDER AND ALLODERM Bilateral 01/07/2022   Procedure: BILATERAL BREAST RECONSTRUCTION WITH PLACEMENT OF TISSUE EXPANDERS AND ALLODERM;  Surgeon: Irene Limbo, MD;  Location: [facility];  Service: Plastics;  Laterality: Bilateral;   NIPPLE SPARING MASTECTOMY Right 01/07/2022   Procedure: RIGHT NIPPLE SPARING MASTECTOMY;  Surgeon: Erroll Luna, MD;  Location: [facility];  Service: General;  Laterality: Right;   NIPPLE SPARING MASTECTOMY WITH SENTINEL LYMPH NODE BIOPSY Left 01/07/2022   Procedure: LEFT NIPPLE SPARING MASTECTOMY WITH SENTINEL LYMPH NODE BIOPSY;  Surgeon: Erroll Luna, MD;  Location: [facility];  Service: General;  Laterality: Left;   TUBAL LIGATION     Patient Active Problem List   Diagnosis Date Noted   Hypokalemia 04/08/2022   Anemia of chronic disease 04/08/2022   Restless leg syndrome 04/08/2022   Obesity (BMI 30-39.9) 04/08/2022   Bronchiectasis with acute exacerbation ([place]) 04/07/2022   Neutropenic fever ([place]) 04/07/2022   Pulmonary nodules 04/07/2022   Anxiety    Hiatal hernia with GERD    Invasive lobular carcinoma of right breast, stage  1 ([place]) 01/24/2022   Genetic testing 12/11/2021   Family history of breast cancer 11/29/2021   Malignant neoplasm of upper-inner quadrant of left breast in female, estrogen receptor positive ([place]) 11/26/2021    REFERRING DIAG: bilateral breast cancer at risk for lymphedema  THERAPY DIAG:  Aftercare following surgery for neoplasm  PERTINENT HISTORY:  Patient was diagnosed on 09/10/2021 with 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 Two negative axillary nodes were removed on the left side. An incidental finding of right breast invasive lobular carcinoma was also found and is ER/PR positive and HER2 negative. It is weakly ER positive, PR negative and HER2 negative with a Ki67 of 20%. She has a left wrist plate from a previous surgery in 2006 and a history of non-Hodgkins Lymphoma in 1985.  PRECAUTIONS: bilateral UE Lymphedema risk; recently hospitalized for respiratory issues  SUBJECTIVE: Pt here for SOZO screen  PAIN:  Are you having pain? No  SOZO SCREENING: Patient was assessed today using the SOZO machine to determine the lymphedema index score. This was compared to her baseline score. It was determined that she is within the recommended range when compared to her baseline and no further action is needed at this time. She will continue SOZO screenings. These are done every 3 months for 2 years post operatively followed by every 6 months for 2 years, and  then annually.   L-DEX FLOWSHEETS - 04/14/22 1400       L-DEX LYMPHEDEMA SCREENING   Measurement Type Bilateral    L-DEX MEASUREMENT EXTREMITY Upper Extremity    POSITION  Standing    DOMINANT SIDE Right    At Risk Side --    BASELINE SCORE (UNILATERAL) --    L-DEX SCORE (UNILATERAL) --    VALUE CHANGE (UNILAT) --    BASELINE RIGHT 2.7    BASELINE LEFT 2.3    RIGHT SIDE SCORE 1.6    LEFT SIDE SCORE 1.4     VALUE CHANGE RIGHT -1.1    VALUE CHANGE LEFT -0.9             Annia Friendly, PT 04/14/22 2:18 PM

## 2022-04-14 NOTE — Telephone Encounter (Signed)
Pt called concerned about her upcoming appt on Thursday.  Pt stated she was just d/c from hospital and is taking Abx for the next 5 days and feels she may need more Abx.  Pt wanted to know if Dr. Burr Medico feels she should get treatment on Thursday and if she would be starting her new treatment on Thursday?  Pt also would like for Dr. Burr Medico to prescribe more Abx.  Pt is requesting if someone would please give her a call before her appts on Thursday 04/17/2022.  Pt is scheduled to see Dede Query, PA-C on Thursday; therefore, included Murray Hodgkins on message.

## 2022-04-16 ENCOUNTER — Ambulatory Visit: Payer: BC Managed Care – PPO

## 2022-04-16 ENCOUNTER — Institutional Professional Consult (permissible substitution): Payer: BC Managed Care – PPO | Admitting: Pulmonary Disease

## 2022-04-16 DIAGNOSIS — R293 Abnormal posture: Secondary | ICD-10-CM

## 2022-04-16 DIAGNOSIS — Z483 Aftercare following surgery for neoplasm: Secondary | ICD-10-CM | POA: Diagnosis present

## 2022-04-16 DIAGNOSIS — Z170 Estrogen receptor positive status [ER+]: Secondary | ICD-10-CM | POA: Diagnosis present

## 2022-04-16 DIAGNOSIS — C50212 Malignant neoplasm of upper-inner quadrant of left female breast: Secondary | ICD-10-CM | POA: Diagnosis present

## 2022-04-16 NOTE — Patient Instructions (Signed)

## 2022-04-16 NOTE — Therapy (Signed)
OUTPATIENT PHYSICAL THERAPY TREATMENT NOTE   Patient Name: Erika Hodge MRN: [id number] DOB:September 12, 1966, 56 y.o., female Today's Date: 04/16/2022  PCP: Bonnita Nasuti, MD REFERRING PROVIDER: Erroll Luna, MD  END OF SESSION:   PT End of Session - 04/16/22 1300     Visit Number 13    Number of Visits 15    Date for PT Re-Evaluation 04/23/22    PT Start Time 4010    PT Stop Time 1357    PT Time Calculation (min) 54 min              Past Medical History:  Diagnosis Date   Anxiety    Complication of anesthesia    HA   Non Hodgkin's lymphoma ([place])    Psoriasis    Past Surgical History:  Procedure Laterality Date   BREAST BIOPSY Left 11/20/2021   BREAST RECONSTRUCTION WITH PLACEMENT OF TISSUE EXPANDER AND ALLODERM Bilateral 01/07/2022   Procedure: BILATERAL BREAST RECONSTRUCTION WITH PLACEMENT OF TISSUE EXPANDERS AND ALLODERM;  Surgeon: Irene Limbo, MD;  Location: [place];  Service: Plastics;  Laterality: Bilateral;   NIPPLE SPARING MASTECTOMY Right 01/07/2022   Procedure: RIGHT NIPPLE SPARING MASTECTOMY;  Surgeon: Erroll Luna, MD;  Location: [place];  Service: General;  Laterality: Right;   NIPPLE SPARING MASTECTOMY WITH SENTINEL LYMPH NODE BIOPSY Left 01/07/2022   Procedure: LEFT NIPPLE SPARING MASTECTOMY WITH SENTINEL LYMPH NODE BIOPSY;  Surgeon: Erroll Luna, MD;  Location: [place];  Service: General;  Laterality: Left;   TUBAL LIGATION     Patient Active Problem List   Diagnosis Date Noted   Hypokalemia 04/08/2022   Anemia of chronic disease 04/08/2022   Restless leg syndrome 04/08/2022   Obesity (BMI 30-39.9) 04/08/2022   Bronchiectasis with acute exacerbation ([place]) 04/07/2022   Neutropenic fever ([place]) 04/07/2022   Pulmonary nodules 04/07/2022   Anxiety    Hiatal hernia with GERD    Invasive lobular carcinoma of right breast, stage 1 ([place]) 01/24/2022   Genetic testing 12/11/2021   Family  history of breast cancer 11/29/2021   Malignant neoplasm of upper-inner quadrant of left breast in female, estrogen receptor positive ([place]) 11/26/2021    REFERRING DIAG: s/p bilateral Mastectomies with left SLNB  THERAPY DIAG:  Aftercare following surgery for neoplasm  Abnormal posture  Malignant neoplasm of upper-inner quadrant of left breast in female, estrogen receptor positive ([place])  PERTINENT HISTORY: Patient was diagnosed on 09/10/2021 with 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 Two negative axillary nodes were removed on the left side. An incidental finding of right breast invasive lobular carcinoma was also found and is ER/PR positive and HER2 negative. It is weakly ER positive, PR negative and HER2 negative with a Ki67 of 20%. She has a left wrist plate from a previous surgery in 2006 and a history of non-Hodgkins Lymphoma in 1985.  PRECAUTIONS: left lymphedema risk, hx of Non-Hodgkins Lymphoma, left wrist plate from prior surgery  SUBJECTIVE:  I have my next infusion this Friday. Hands are still pink but no longer hurting. I was hospitalized for 2 days after the last infusion.I haven't done as much as I should.  I feel a little swollen under the arm. Marland Kitchen   PAIN:  Are you having pain? No  02/03/2022 OBSERVATIONS:            Right breast with increased edema compared to  left. Left inferior breast incision appears to be slightly macerated. Non-stick pad applied under each breast to reduce fluid leakage. Left axillary incision appears to be healing well.    POSTURE:  Forward head, rounded shoulders   LYMPHEDEMA ASSESSMENT:    A/PROM Right 11/27/2021 Left 11/27/2021 Right 02/03/2022 Left  02/03/2022 Right 02/21/22 Left 02/21/22 Right 03/03/2022 Left 03/03/2022 Right 03/26/22 Left  03/26/22  Shoulder extension 38 48 39 32 61 54 51 58    Shoulder flexion  157 152 100 51 148 115 163 143  155  Shoulder abduction 165 163 60 58 169 120 179 133  165  Shoulder internal rotation 67 62 63 NT 54 55 68 64    Shoulder external rotation 77 77 84 NT 90 78 97 88                            (Blank rows = not tested)    LYMPHEDEMA ASSESSMENTS:    LANDMARK RIGHT 11/27/2021 LEFT 11/27/2021 RIGHT 02/03/2022 LEFT 02/03/2022  10 cm proximal to olecranon process 32 31.9 31.8 31.5  Olecranon process 27 27 27.7 26.4  10 cm proximal to ulnar styloid process 23.7 23 23.3 22.7  Just proximal to ulnar styloid process 16.4 16.8 16.1 16.7  Across hand at thumb web space 19 18.7 19 18.8  At base of 2nd digit 6.8 7.5 6.7 7.5  (Blank rows = not tested)   Treatment:  04/16/2022 Soft tissue mobilization to left UT, pectorals and lats in supine and UT and scapular area in right S/L.Marland Kitchen   PROM to left shoulder flexion, abduction, and D2, ER MLD briefly: bilateral supraclavicular, 5 diaphragmatic breaths, left inguinal LN's and Left axillo-inguinal pathway and posterior interaxillary pathwaydirecting fluid away from lateral trunk repeating pathway and ending with inguinal LN's. LTR x 5 to the right,Supine scapular series with yellow band x 5 except sword.   03/26/22 Soft tissue mobilization to left UT, pectorals and lats in supine and UT and scapular area in right S/L.Lt scapular mobilization with sig tightness and decreased movement here limiting abduction in sidelying.   PROM to left shoulder flexion, abduction, and D2. Multiple VC's to pt to relax her arm, and significant guarding with twitching noted in lat and pectoralis MLD briefly: bilateral supraclavicular, 5 diaphragmatic breaths, left inguinal LN's and Left axillo-inguinal pathway directing fluid away from lateral trunk repeating pathway and ending with inguinal LN's.  TE:  LTR x 5 with goal post arms  Supine dowel flexion x 8  Supine protraction/retraction x 5 with sig vcs and tcs needed  Practiced scapular clock in  seated which pt had difficulty isolating   03/24/22: MANUAL Soft tissue mobilization to left UT, pectorals and lats in supine and UT and scapular area in right S/L. PROM to left shoulder flexion, abduction, and D2. Multiple VC's to pt to relax her arm, improved with practice MLD: bilateral supraclavicular, 5 diaphragmatic breaths, left inguinal LN's and Left axillo-inguinal pathway directing fluid away from lateral trunk repeating pathway and ending with inguinal LN's. Pt did not practice secondary to right hand red and blistered from "chemo hand"  03/17/2022   Pts right hand very blistered from chemo. Took photo and messaged MD as pt wanted to know what to put on it. MANUAL Soft tissue mobilization to left UT, pectorals and lats in supine and Ut and scapular area in right SL.Marland Kitchen PROM to left shoulder flexion, scaption, abduction, IR and ER. Multiple VC's to  pt to relax her arm, improved with practice MLD bilateral supraclavicular, 5 diaphragmatic breaths, left inguinal LN's and Left axillo-inguinal pathway directing fluid away from lateral trunk repeating pathway and ending with inguinal LN's. Pt did not practice secondary to right hand red and blistered from "chemo hand" Lower trunk rotation bilaterally x 5 with arms outstretched and with goal post position            Surgery type/Date: 01/07/2022 Number of lymph nodes removed: 2 on left Current/past treatment (chemo, radiation, hormone therapy): Radiation to chest for Non-hodgkins lymphoma in 1985 Other symptoms:  Heaviness/tightness Yes Pain Yes Pitting edema No Infections No Decreased scar mobility Yes Stemmer sign No     PATIENT EDUCATION:  04/16/2022 Education details: Supine scapular series except sword Person educated: Patient  Education method: Explanation, demonstration, handout Education comprehension: verbalized, demonstrated understanding, and pt returned demonstration      HOME EXERCISE PROGRAM:  Reviewed previously given  post op HEP. Performed supine clasped hands flexion and stargazer today with improved ease 4/14- issued supine dowel flexion and abduction , 03/05/2022 Lower trunk rotation with arms outstretched; 03/10/22: Self MLD to Lt lateral trunk, 03/26/22 scapular clock movements  04/16/2022 Supine scapular series except sword x 5. Gave yellow band ASSESSMENT:   CLINICAL IMPRESSION: Pt has not been seen in PT since 03/26/2022 due to infusion and hospitalization. She was tight and tender throughout the left upper quarter and has some visible swelling at the lateral trunk. She did well with supine scapular series after practicing a few reps but required VC's and demonstration to correct.    Pt will benefit from skilled therapeutic intervention to improve on the following deficits: Decreased knowledge of precautions, impaired UE functional use, pain, decreased ROM, postural dysfunction.    PT treatment/interventions: ADL/Self care home management, Therapeutic exercises, Therapeutic activity, Neuromuscular re-education, Balance training, Gait training, Patient/Family education, and Joint mobilization         GOALS: Goals reviewed with patient? Yes   LONG TERM GOALS:  (STG=LTG)   GOALS Name Target Date Goal status  1 Pt will demonstrate she has regained full shoulder ROM and function post operatively compared to baselines.  Baseline: 03/31/2022 MET  2 Patient will increase bilateral shoulder flexion to be >/= 140 degrees for increased ease reaching overhead.   03/03/2022 MET 03/03/2022  3 Patient will increase bilateral shoulder abduction to be >/= 150 degrees for increased ability to obtain radiation positioning. 03/31/2022 MET  4 Patient will improve her DASH score to be </= 10 for improved overall UE function. 03/31/2022 Ongoing 41% today 18% on 03/26/22  5 Pt will be able to perform sidelying abduction with proper scapular movement to limit impingement 04/23/22 NEW        PLAN: PT FREQUENCY/DURATION:  1x/week for 4 weeks   PLAN FOR NEXT SESSION: Add scapular movement and protraction/retraction work, Trial of supine scapular series with yellow theraband when blister on Rt hand improved; cont gentle PROM, STM to Lt  axillary and periscapular area; cont and review MLD to left inferior axillary region for pain/swelling, cont ball roll up wall, modified downward dog on wall        Claris Pong, PT 04/16/2022, 1:59 PM    Self manual lymph drainage: Perform this sequence at least once a day.  Only give enough pressure to your skin to make the skin move.  Hug yourself.  Do circles at your neck just above your collarbones.  Repeat this 10 times  Diaphragmatic -  Supine   Inhale through nose making navel move out toward hands. Exhale through puckered lips, hands follow navel in. Repeat _5__ times. Rest _10__ seconds between repeats.  LEG: Inguinal Nodes Stimulation   With small finger side of hand against hip crease on involved side, gently perform circles at the crease. Repeat __10_ times.   Copyright  [employer]HI. All rights reserved.  Axilla to Inguinal Nodes - Sweep   On involved side, pump _4__ times from armpit along side of trunk to hip crease.      Finish by doing the circles  same side groin.  Cancer Rehab [phone]

## 2022-04-17 ENCOUNTER — Other Ambulatory Visit: Payer: Self-pay | Admitting: Physician Assistant

## 2022-04-17 MED FILL — Dexamethasone Sodium Phosphate Inj 100 MG/10ML: INTRAMUSCULAR | Qty: 1 | Status: AC

## 2022-04-18 ENCOUNTER — Other Ambulatory Visit: Payer: Self-pay

## 2022-04-18 ENCOUNTER — Encounter: Payer: Self-pay | Admitting: *Deleted

## 2022-04-18 ENCOUNTER — Inpatient Hospital Stay: Payer: BC Managed Care – PPO

## 2022-04-18 ENCOUNTER — Inpatient Hospital Stay: Payer: BC Managed Care – PPO | Attending: Hematology | Admitting: Physician Assistant

## 2022-04-18 VITALS — BP 152/76 | HR 92 | Temp 97.9°F | Resp 18 | Ht 63.0 in | Wt 181.6 lb

## 2022-04-18 DIAGNOSIS — Z170 Estrogen receptor positive status [ER+]: Secondary | ICD-10-CM

## 2022-04-18 DIAGNOSIS — Z5189 Encounter for other specified aftercare: Secondary | ICD-10-CM | POA: Diagnosis not present

## 2022-04-18 DIAGNOSIS — C50919 Malignant neoplasm of unspecified site of unspecified female breast: Secondary | ICD-10-CM

## 2022-04-18 DIAGNOSIS — Z5111 Encounter for antineoplastic chemotherapy: Secondary | ICD-10-CM | POA: Diagnosis present

## 2022-04-18 DIAGNOSIS — C50911 Malignant neoplasm of unspecified site of right female breast: Secondary | ICD-10-CM | POA: Diagnosis not present

## 2022-04-18 DIAGNOSIS — D72829 Elevated white blood cell count, unspecified: Secondary | ICD-10-CM

## 2022-04-18 DIAGNOSIS — R0602 Shortness of breath: Secondary | ICD-10-CM | POA: Diagnosis not present

## 2022-04-18 DIAGNOSIS — Z171 Estrogen receptor negative status [ER-]: Secondary | ICD-10-CM | POA: Diagnosis not present

## 2022-04-18 DIAGNOSIS — C50212 Malignant neoplasm of upper-inner quadrant of left female breast: Secondary | ICD-10-CM

## 2022-04-18 LAB — CBC WITH DIFFERENTIAL (CANCER CENTER ONLY)
Abs Immature Granulocytes: 0.06 10*3/uL (ref 0.00–0.07)
Basophils Absolute: 0.1 10*3/uL (ref 0.0–0.1)
Basophils Relative: 0 %
Eosinophils Absolute: 0 10*3/uL (ref 0.0–0.5)
Eosinophils Relative: 0 %
HCT: 33.4 % — ABNORMAL LOW (ref 36.0–46.0)
Hemoglobin: 10.6 g/dL — ABNORMAL LOW (ref 12.0–15.0)
Immature Granulocytes: 0 %
Lymphocytes Relative: 13 %
Lymphs Abs: 1.9 10*3/uL (ref 0.7–4.0)
MCH: 29.5 pg (ref 26.0–34.0)
MCHC: 31.7 g/dL (ref 30.0–36.0)
MCV: 93 fL (ref 80.0–100.0)
Monocytes Absolute: 1.5 10*3/uL — ABNORMAL HIGH (ref 0.1–1.0)
Monocytes Relative: 10 %
Neutro Abs: 11.6 10*3/uL — ABNORMAL HIGH (ref 1.7–7.7)
Neutrophils Relative %: 77 %
Platelet Count: 447 10*3/uL — ABNORMAL HIGH (ref 150–400)
RBC: 3.59 MIL/uL — ABNORMAL LOW (ref 3.87–5.11)
RDW: 16.8 % — ABNORMAL HIGH (ref 11.5–15.5)
WBC Count: 15.2 10*3/uL — ABNORMAL HIGH (ref 4.0–10.5)
nRBC: 0 % (ref 0.0–0.2)

## 2022-04-18 LAB — CMP (CANCER CENTER ONLY)
ALT: 13 U/L (ref 0–44)
AST: 14 U/L — ABNORMAL LOW (ref 15–41)
Albumin: 4.2 g/dL (ref 3.5–5.0)
Alkaline Phosphatase: 63 U/L (ref 38–126)
Anion gap: 8 (ref 5–15)
BUN: 14 mg/dL (ref 6–20)
CO2: 25 mmol/L (ref 22–32)
Calcium: 9.9 mg/dL (ref 8.9–10.3)
Chloride: 104 mmol/L (ref 98–111)
Creatinine: 0.8 mg/dL (ref 0.44–1.00)
GFR, Estimated: >60 mL/min (ref >60)
Glucose, Bld: 105 mg/dL — ABNORMAL HIGH (ref 70–99)
Potassium: 4.1 mmol/L (ref 3.5–5.1)
Sodium: 137 mmol/L (ref 135–145)
Total Bilirubin: 0.3 mg/dL (ref 0.3–1.2)
Total Protein: 7.3 g/dL (ref 6.5–8.1)

## 2022-04-18 MED ORDER — DOXYCYCLINE HYCLATE 100 MG PO TABS: 100.0000 mg | ORAL_TABLET | Freq: Two times a day (BID) | ORAL | 0 refills | Status: AC

## 2022-04-20 ENCOUNTER — Encounter: Payer: Self-pay | Admitting: Hematology

## 2022-04-20 MED ORDER — ALBUTEROL SULFATE HFA 108 (90 BASE) MCG/ACT IN AERS: 2.0000 | INHALATION_SPRAY | Freq: Four times a day (QID) | RESPIRATORY_TRACT | 2 refills | Status: DC | PRN

## 2022-04-20 NOTE — Progress Notes (Signed)
[place]   [phone] [phone]   Clinic Follow up Note   Patient Care Team: Bonnita Nasuti, MD as PCP - General (Internal Medicine) Erroll Luna, MD as Consulting Physician (General Surgery) Truitt Merle, MD as Consulting Physician (Hematology) Gery Pray, MD as Consulting Physician (Radiation Oncology) Mauro Kaufmann, RN as Oncology Nurse Navigator Rockwell Germany, RN as Oncology Nurse Navigator  Date of Service:  04/20/2022  CHIEF COMPLAINT: f/u of bilateral breast cancer  CURRENT THERAPY:  Adjuvant TC, q21d, starting 02/12/22  SUMMARY OF ONCOLOGIC HISTORY: Oncology History Overview Note   Cancer Staging  Invasive lobular carcinoma of right breast, stage 1 ([place]) Staging form: Breast, AJCC 8th Edition - Clinical stage from 01/07/2022: Stage Unknown (cT1c, cNX, cM0, G1, ER+, PR+, HER2-) - Signed by Truitt Merle, MD on 01/24/2022  Malignant neoplasm of upper-inner quadrant of left breast in female, estrogen receptor positive ([place]) Staging form: Breast, AJCC 8th Edition - Clinical stage from 11/20/2021: Stage IA (cT1c, cN0, cM0, G2, ER+, PR-, HER2-) - Signed by Truitt Merle, MD on 11/26/2021 - Pathologic stage from 01/07/2022: Stage IIA (pT2, pN0, cM0, G2, ER-, PR-, HER2-) - Signed by Truitt Merle, MD on 01/24/2022     Malignant neoplasm of upper-inner quadrant of left breast in female, estrogen receptor positive ([place])  11/05/2021 Mammogram   CLINICAL DATA:  Screening recall for a possible left breast asymmetry.   EXAM: DIGITAL DIAGNOSTIC UNILATERAL LEFT MAMMOGRAM WITH TOMOSYNTHESIS AND CAD; ULTRASOUND LEFT BREAST LIMITED  IMPRESSION: 1. Irregular 1.2 cm mass in the left breast at 11 o'clock, 2 cm the nipple, suspicious for malignancy.   11/20/2021 Cancer Staging   Staging form: Breast, AJCC 8th Edition - Clinical stage from 11/20/2021: Stage IA (cT1c, cN0, cM0, G2, ER+, PR-, HER2-) - Signed by Truitt Merle, MD on 11/26/2021 Stage prefix: Initial  diagnosis Histologic grading system: 3 grade system   11/20/2021 Initial Biopsy   Diagnosis Breast, left, needle core biopsy, 11 o'clock, ribbon clip - INVASIVE DUCTAL CARCINOMA - DUCTAL CARCINOMA IN SITU - SEE COMMENT Microscopic Comment based on the biopsy, the carcinoma appears Nottingham grade 2 of 3 and measures 0.6 cm in greatest linear extent.  PROGNOSTIC INDICATORS Results: The tumor cells are NEGATIVE for Her2 (1+). Estrogen Receptor: 10%, POSITIVE, WEAK STAINING INTENSITY Progesterone Receptor: 0%, NEGATIVE Proliferation Marker Ki67: 20%   11/26/2021 Initial Diagnosis   Malignant neoplasm of upper-inner quadrant of left breast in female, estrogen receptor positive ([place])   11/27/2021 Genetic Testing   [facility] was Negative. Report date is 12/11/2021.  The CancerNext gene panel offered by [employer] includes sequencing, rearrangement analysis, and RNA analysis for the following 36 genes:   APC, ATM, AXIN2, BARD1, BMPR1A, BRCA1, BRCA2, BRIP1, CDH1, CDK4, CDKN2A, CHEK2, DICER1, HOXB13, EPCAM, GREM1, MLH1, MSH2, MSH3, MSH6, MUTYH, NBN, NF1, NTHL1, PALB2, PMS2, POLD1, POLE, PTEN, RAD51C, RAD51D, RECQL, SMAD4, SMARCA4, STK11, and TP53.    01/07/2022 Cancer Staging   Staging form: Breast, AJCC 8th Edition - Pathologic stage from 01/07/2022: Stage IIA (pT2, pN0, cM0, G2, ER-, PR-, HER2-) - Signed by Truitt Merle, MD on 01/24/2022 Stage prefix: Initial diagnosis Histologic grading system: 3 grade system Residual tumor (R): R0 - None   01/07/2022 Definitive Surgery   FINAL MICROSCOPIC DIAGNOSIS:   A. BREAST, RIGHT NIPPLE, BIOPSY:  - Benign breast tissue.  - No malignancy identified.   B. BREAST, LEFT NIPPLE, BIOPSY:  - Benign breast tissue.  - No malignancy identified.   C. BREAST,  RIGHT, MASTECTOMY:  - Invasive lobular carcinoma, 1.1 cm.  - Ductal carcinoma in situ, 1 cm.  - Invasive carcinoma focally extends to the anterior margin.  - Fibrocystic changes with usual  ductal hyperplasia and calcifications.  - Sclerosing adenosis and fibroadenomatoid change.  - See oncology table and comment.   D. LYMPH NODE, LEFT AXILLARY, SENTINEL, EXCISION:  - One lymph node negative for metastatic carcinoma (0/1).   E. LYMPH NODE, LEFT AXILLARY, SENTINEL, EXCISION:  - One lymph node negative for metastatic carcinoma (0/1).   F. BREAST, LEFT, MASTECTOMY:  - Invasive and in situ ductal carcinoma, 2.1 cm.  - Margins negative for carcinoma.  - See oncology table.    01/07/2022 Receptors her2   F1. PROGNOSTIC INDICATOR RESULTS:   The tumor cells are NEGATIVE for Her2 (0).   Estrogen Receptor: NEGATIVE  Progesterone Receptor: NEGATIVE  Proliferation Marker Ki-67: 10%    02/12/2022 -  Chemotherapy   Patient is on Treatment Plan : BREAST TC q21d     Invasive lobular carcinoma of right breast, stage 1 ([place])  11/27/2021 Genetic Testing   [facility] [facility] was Negative. Report date is 12/11/2021.  The CancerNext gene panel offered by [employer] includes sequencing, rearrangement analysis, and RNA analysis for the following 36 genes:   APC, ATM, AXIN2, BARD1, BMPR1A, BRCA1, BRCA2, BRIP1, CDH1, CDK4, CDKN2A, CHEK2, DICER1, HOXB13, EPCAM, GREM1, MLH1, MSH2, MSH3, MSH6, MUTYH, NBN, NF1, NTHL1, PALB2, PMS2, POLD1, POLE, PTEN, RAD51C, RAD51D, RECQL, SMAD4, SMARCA4, STK11, and TP53.    01/07/2022 Cancer Staging   Staging form: Breast, AJCC 8th Edition - Clinical stage from 01/07/2022: Stage Unknown (cT1c, cNX, cM0, G1, ER+, PR+, HER2-) - Signed by Truitt Merle, MD on 01/24/2022 Stage prefix: Initial diagnosis Histologic grading system: 3 grade system   01/07/2022 Definitive Surgery   FINAL MICROSCOPIC DIAGNOSIS:   A. BREAST, RIGHT NIPPLE, BIOPSY:  - Benign breast tissue.  - No malignancy identified.   B. BREAST, LEFT NIPPLE, BIOPSY:  - Benign breast tissue.  - No malignancy identified.   C. BREAST, RIGHT, MASTECTOMY:  - Invasive lobular carcinoma, 1.1 cm.  - Ductal  carcinoma in situ, 1 cm.  - Invasive carcinoma focally extends to the anterior margin.  - Fibrocystic changes with usual ductal hyperplasia and calcifications.  - Sclerosing adenosis and fibroadenomatoid change.  - See oncology table and comment.   D. LYMPH NODE, LEFT AXILLARY, SENTINEL, EXCISION:  - One lymph node negative for metastatic carcinoma (0/1).   E. LYMPH NODE, LEFT AXILLARY, SENTINEL, EXCISION:  - One lymph node negative for metastatic carcinoma (0/1).   F. BREAST, LEFT, MASTECTOMY:  - Invasive and in situ ductal carcinoma, 2.1 cm.  - Margins negative for carcinoma.  - See oncology table.    01/07/2022 Receptors her2   C7.  PROGNOSTIC INDICATOR RESULTS:   The tumor cells are NEGATIVE for Her2 (1+).   Estrogen Receptor: POSITIVE, 30% WEAK STAINING INTENSITY  Progesterone Receptor: POSITIVE, 80% MODERATE STAINING INTENSITY  Proliferation Marker Ki-67: <5%    01/24/2022 Initial Diagnosis   Invasive lobular carcinoma of right breast, stage 1 ([place])      INTERVAL HISTORY:  TAVON CORRIHER is here for a follow up of breast cancer. She was last seen by Dr/ Burr Medico on 5/18//23. She presents to the clinic accompanied by her husband. Since the last visit, she was admitted from 04/07/2022-04/09/2022 acute exacerbation of bronchiectasis.   Ms. Mcevoy reports that her energy levels have improved some since hospital discharge but she is  still feeling fatigued. Her appetite is unchanged without any weight loss. She continues to have shortness of breath with exertion. She still has some post nasal drip and dry cough. She denies nausea, vomiting or abdominal pain. Her bowel habits are unchanged. She denies easy bruising or signs of active bleeding. She has no other complaints.    All other systems were reviewed with the patient and are negative.  MEDICAL HISTORY:  Past Medical History:  Diagnosis Date   Anxiety    Complication of anesthesia    HA   Non Hodgkin's lymphoma ([place])     Psoriasis     SURGICAL HISTORY: Past Surgical History:  Procedure Laterality Date   BREAST BIOPSY Left 11/20/2021   BREAST RECONSTRUCTION WITH PLACEMENT OF TISSUE EXPANDER AND ALLODERM Bilateral 01/07/2022   Procedure: BILATERAL BREAST RECONSTRUCTION WITH PLACEMENT OF TISSUE EXPANDERS AND ALLODERM;  Surgeon: Anyely Cunning Limbo, MD;  Location: [facility];  Service: Plastics;  Laterality: Bilateral;   NIPPLE SPARING MASTECTOMY Right 01/07/2022   Procedure: RIGHT NIPPLE SPARING MASTECTOMY;  Surgeon: Erroll Luna, MD;  Location: [facility];  Service: General;  Laterality: Right;   NIPPLE SPARING MASTECTOMY WITH SENTINEL LYMPH NODE BIOPSY Left 01/07/2022   Procedure: LEFT NIPPLE SPARING MASTECTOMY WITH SENTINEL LYMPH NODE BIOPSY;  Surgeon: Erroll Luna, MD;  Location: [facility];  Service: General;  Laterality: Left;   TUBAL LIGATION      I have reviewed the social history and family history with the patient and they are unchanged from previous note.  ALLERGIES:  is allergic to amoxicillin, ibuprofen, ciprofloxacin, and codeine.  MEDICATIONS:  Current Outpatient Medications  Medication Sig Dispense Refill   acetaminophen (TYLENOL) 500 MG tablet Take 1,000 mg by mouth every 6 (six) hours as needed for mild pain or fever.     albuterol (VENTOLIN HFA) 108 (90 Base) MCG/ACT inhaler Inhale 2 puffs into the lungs every 6 (six) hours as needed for wheezing or shortness of breath. 8 g 2   ALPRAZolam (XANAX) 0.5 MG tablet Take 0.5 mg by mouth daily as needed for anxiety or sleep.     ASHWAGANDHA PO Take 1 tablet by mouth daily.     Calcipotriene-Betameth Diprop (ENSTILAR) 0.005-0.064 % FOAM Apply 1 application topically as needed (apply to psoriasis area as needed).     dexamethasone (DECADRON) 4 MG tablet Take 1 tablet (4 mg total) by mouth 2 (two) times daily. Start the day before Taxotere. Then change to one tablet daily after chemo for 3 days. 30  tablet 1   doxycycline (VIBRA-TABS) 100 MG tablet Take 1 tablet (100 mg total) by mouth 2 (two) times daily for 7 days. 14 tablet 0   FLUoxetine (PROZAC) 20 MG capsule Take 20 mg by mouth daily.     fluticasone (FLONASE) 50 MCG/ACT nasal spray Place 2 sprays into both nostrils daily. 15.8 mL 2   loratadine (CLARITIN) 10 MG tablet Take 10 mg by mouth daily.     magic mouthwash w/lidocaine SOLN Take 5 mLs by mouth 4 (four) times daily as needed for mouth pain. 240 mL 0   omeprazole (PRILOSEC) 40 MG capsule Take 40 mg by mouth daily.     ondansetron (ZOFRAN) 8 MG tablet Take 1 tablet (8 mg total) by mouth 2 (two) times daily as needed for refractory nausea / vomiting. Start on day 3 after chemo. 30 tablet 1   rOPINIRole (REQUIP) 0.5 MG tablet Take 1 tablet (0.5 mg total) by mouth at  bedtime. 30 tablet 0   senna-docusate (SENOKOT-S) 8.6-50 MG tablet Take 1 tablet by mouth at bedtime as needed for mild constipation. 30 tablet 0   zolpidem (AMBIEN) 10 MG tablet Take 10 mg by mouth at bedtime as needed for sleep.     phenol (CHLORASEPTIC) 1.4 % LIQD Use as directed 1 spray in the mouth or throat as needed for throat irritation / pain. (Patient not taking: Reported on 04/18/2022) 236 mL 0   prochlorperazine (COMPAZINE) 10 MG tablet Take 1 tablet (10 mg total) by mouth every 6 (six) hours as needed (Nausea or vomiting). (Patient not taking: Reported on 04/18/2022) 30 tablet 1   No current facility-administered medications for this visit.    PHYSICAL EXAMINATION: ECOG PERFORMANCE STATUS: 1 - Symptomatic but completely ambulatory  Vitals:   04/18/22 0952  BP: (!) 152/76  Pulse: 92  Resp: 18  Temp: 97.9 F (36.6 C)  SpO2: 94%   Wt Readings from Last 3 Encounters:  04/18/22 181 lb 9.6 oz (82.4 kg)  04/07/22 182 lb 12.2 oz (82.9 kg)  03/27/22 178 lb 9.6 oz (81 kg)     GENERAL:alert, no distress and comfortable SKIN: skin color normal, no rashes or significant lesions EYES: normal, Conjunctiva are  pink and non-injected, sclera clear  RESP: Clear to auscultation in all lung field. No wheezing, crackles.  CARDIO: Heart sounds normal. RRR.  NEURO: alert & oriented x 3 with fluent speech  LABORATORY DATA:  I have reviewed the data as listed    Latest Ref Rng & Units 04/18/2022    9:41 AM 04/09/2022    3:49 AM 04/08/2022    2:59 AM  CBC  WBC 4.0 - 10.5 K/uL 15.2  4.3  4.4   Hemoglobin 12.0 - 15.0 g/dL 10.6  9.9  10.1   Hematocrit 36.0 - 46.0 % 33.4  31.5  31.7   Platelets 150 - 400 K/uL 447  290  360         Latest Ref Rng & Units 04/18/2022    9:41 AM 04/09/2022    3:49 AM 04/08/2022    2:59 AM  CMP  Glucose 70 - 99 mg/dL 105  138  155   BUN 6 - 20 mg/dL 14  9  8    Creatinine 0.44 - 1.00 mg/dL 0.80  0.72  0.73   Sodium 135 - 145 mmol/L 137  138  138   Potassium 3.5 - 5.1 mmol/L 4.1  4.1  3.3   Chloride 98 - 111 mmol/L 104  108  108   CO2 22 - 32 mmol/L 25  23  23    Calcium 8.9 - 10.3 mg/dL 9.9  8.6  8.1   Total Protein 6.5 - 8.1 g/dL 7.3     Total Bilirubin 0.3 - 1.2 mg/dL 0.3     Alkaline Phos 38 - 126 U/L 63     AST 15 - 41 U/L 14     ALT 0 - 44 U/L 13         RADIOGRAPHIC STUDIES: I have personally reviewed the radiological images as listed and agreed with the findings in the report. No results found.    ASSESSMENT & PLAN:  Erika Hodge is a 56 y.o. post-menopausal female with   1. 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 -she opted to proceed with b/l mastectomies  with reconstruction on 01/07/22 with Dr. Brantley Stage and Dr. Iran Planas. Pathology revealed b/l breast cancers. Left breast showed 2.1 cm invasive and in situ ductal carcinoma. Margins and lymph nodes negative (0/2). Repeated ER/PR/HER2 were all negative (ER 10% weak (+) on biopsy) -she developed left IMF necrotic tissue, excised by Dr. Iran Planas on  01/27/22. -because she was likely treated with adriamycin for her lymphoma, she is not a candidate for more of this medicine. Therefore, I recommend 6 cycles of TC (docetaxel and cytoxan). She declined port placement.  -she began TC on 02/12/22. She tolerated well overall except for moderate constipation. She experienced increased side effects after C2 expecially skin rash, fatigue and diarrhea.  but upon discussion, she tells me she only took the decadron for one day after treatment. I advised her to take for 5 days to see how she does. -Dose reduced docetaxel from 75 mg/m2 to 65 mg/m2 due to her poor tolerance starting Cycle 3.    2. Invasive lobular carcinoma of right breast, stage I pT1c, ER weakly+/PR+/Her2-, and DCIS  -incidental finding on b/l mastectomies on 01/07/22 for left breast cancer. Pathology from right breast showed: 1.1 cm invasive lobular carcinoma, grade 1, in LIQ; 1 cm DCIS, intermediate grade, in UIQ. ILC showed ER 30% weakly positive, PR 80% moderately positive, Her2 negative. ILC focally involves adjacent anterior margin.  -we will discuss antiestrogen therapy after she finishes chemo.   3. 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 -per pt, she also received three years of systemic treatment. She reports she was treated at [facility]. Her records were too old to be received, but she likely received adriamycin during that time.   4. Psoriasis  -previously on Tremfya injections, was told by dermatology that she could not restart due to h/o cancer. -I do not recommend immunotherapy because of this. -followed by Dr. Abner Greenspan with [facility], per pt  5. Hospitalization: Neutropenia/Acute exacerbation of bronchiectasis  -Will discuss resuming GSCF injection with Dr. Burr Medico    PLAN:  -Labs from today reviewed. WBC elevated at 15.2, Hgb 10.6, Plt 447.  -As patient is still recovering from recent hospitalization, recommend  to hold chemotherapy today and reschedule for next week. Advised to closely monitor for fevers and signs of infeciton. Patient is afebrile today.  -Sent another course of doxycycline x 7 days. Sent albuterol inhalter as well.  -Scheduled for pulmonary consultation on 04/23/2022 -RTC on 04/25/2022 for labs, f/u visit with Dr. Burr Medico and cycle 4 of chemotherapy   No orders of the defined types were placed in this encounter.  All questions were answered. The patient knows to call the clinic with any problems, questions or concerns. No barriers to learning was detected.  I have spent a total of 30 minutes minutes of face-to-face and non-face-to-face time, preparing to see the patient,  performing a medically appropriate examination, counseling and educating the patient, ordering medications/tests/procedures,  documenting clinical information in the electronic health record,  and care coordination.   Dede Query PA-C Dept of Hematology and [place] at [facility] Phone: [phone]

## 2022-04-23 ENCOUNTER — Encounter: Payer: Self-pay | Admitting: Internal Medicine

## 2022-04-23 ENCOUNTER — Ambulatory Visit: Payer: BC Managed Care – PPO | Admitting: Internal Medicine

## 2022-04-23 ENCOUNTER — Ambulatory Visit: Payer: BC Managed Care – PPO

## 2022-04-23 VITALS — BP 110/60 | HR 98 | Temp 97.5°F | Ht 63.0 in | Wt 180.0 lb

## 2022-04-23 DIAGNOSIS — C50911 Malignant neoplasm of unspecified site of right female breast: Secondary | ICD-10-CM

## 2022-04-23 DIAGNOSIS — R911 Solitary pulmonary nodule: Secondary | ICD-10-CM | POA: Diagnosis not present

## 2022-04-23 DIAGNOSIS — C50212 Malignant neoplasm of upper-inner quadrant of left female breast: Secondary | ICD-10-CM

## 2022-04-23 DIAGNOSIS — R0602 Shortness of breath: Secondary | ICD-10-CM | POA: Diagnosis not present

## 2022-04-23 DIAGNOSIS — R293 Abnormal posture: Secondary | ICD-10-CM

## 2022-04-23 DIAGNOSIS — Z483 Aftercare following surgery for neoplasm: Secondary | ICD-10-CM

## 2022-04-23 DIAGNOSIS — C50912 Malignant neoplasm of unspecified site of left female breast: Secondary | ICD-10-CM | POA: Diagnosis not present

## 2022-04-23 MED ORDER — ALBUTEROL SULFATE (2.5 MG/3ML) 0.083% IN NEBU: 2.5000 mg | INHALATION_SOLUTION | Freq: Four times a day (QID) | RESPIRATORY_TRACT | 6 refills | Status: DC | PRN

## 2022-04-23 NOTE — Therapy (Signed)
OUTPATIENT PHYSICAL THERAPY TREATMENT NOTE   Patient Name: Erika Hodge MRN: [id number] DOB:03/13/1966, 56 y.o., female Today's Date: 04/23/2022  PCP: Bonnita Nasuti, MD REFERRING PROVIDER: Erroll Luna, MD  END OF SESSION:   PT End of Session - 04/23/22 1254     Visit Number 14    Number of Visits 20    Date for PT Re-Evaluation 06/04/22    PT Start Time 1257    PT Stop Time 1400    PT Time Calculation (min) 63 min    Activity Tolerance Patient tolerated treatment well    Behavior During Therapy WFL for tasks assessed/performed              Past Medical History:  Diagnosis Date   Anxiety    Complication of anesthesia    HA   Non Hodgkin's lymphoma ([place])    Psoriasis    Past Surgical History:  Procedure Laterality Date   BREAST BIOPSY Left 11/20/2021   BREAST RECONSTRUCTION WITH PLACEMENT OF TISSUE EXPANDER AND ALLODERM Bilateral 01/07/2022   Procedure: BILATERAL BREAST RECONSTRUCTION WITH PLACEMENT OF TISSUE EXPANDERS AND ALLODERM;  Surgeon: Irene Limbo, MD;  Location: [facility];  Service: Plastics;  Laterality: Bilateral;   NIPPLE SPARING MASTECTOMY Right 01/07/2022   Procedure: RIGHT NIPPLE SPARING MASTECTOMY;  Surgeon: Erroll Luna, MD;  Location: [facility];  Service: General;  Laterality: Right;   NIPPLE SPARING MASTECTOMY WITH SENTINEL LYMPH NODE BIOPSY Left 01/07/2022   Procedure: LEFT NIPPLE SPARING MASTECTOMY WITH SENTINEL LYMPH NODE BIOPSY;  Surgeon: Erroll Luna, MD;  Location: [facility];  Service: General;  Laterality: Left;   TUBAL LIGATION     Patient Active Problem List   Diagnosis Date Noted   Hypokalemia 04/08/2022   Anemia of chronic disease 04/08/2022   Restless leg syndrome 04/08/2022   Obesity (BMI 30-39.9) 04/08/2022   Bronchiectasis with acute exacerbation ([place]) 04/07/2022   Neutropenic fever ([place]) 04/07/2022   Pulmonary nodules 04/07/2022   Anxiety    Hiatal hernia with  GERD    Invasive lobular carcinoma of right breast, stage 1 ([place]) 01/24/2022   Genetic testing 12/11/2021   Family history of breast cancer 11/29/2021   Malignant neoplasm of upper-inner quadrant of left breast in female, estrogen receptor positive ([place]) 11/26/2021    REFERRING DIAG: s/p bilateral Mastectomies with left SLNB  THERAPY DIAG:  Aftercare following surgery for neoplasm  Abnormal posture  Malignant neoplasm of upper-inner quadrant of left breast in female, estrogen receptor positive ([place])  PERTINENT HISTORY: Patient was diagnosed on 09/10/2021 with 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 Two negative axillary nodes were removed on the left side. An incidental finding of right breast invasive lobular carcinoma was also found and is ER/PR positive and HER2 negative. It is weakly ER positive, PR negative and HER2 negative with a Ki67 of 20%. She has a left wrist plate from a previous surgery in 2006 and a history of non-Hodgkins Lymphoma in 1985.  PRECAUTIONS: left lymphedema risk, hx of Non-Hodgkins Lymphoma, left wrist plate from prior surgery  SUBJECTIVE:   I didn't have the infusion last week because they put me on antibiotics and my counts were down.  I have it this Friday. I have been working on scapular motion and that feels really good. I stay really sore at lateral back and it feels better with the compression bra on.  PAIN:  Are you having pain? No  02/03/2022 OBSERVATIONS:            Right breast with increased edema compared to left. Left inferior breast incision appears to be slightly macerated. Non-stick pad applied under each breast to reduce fluid leakage. Left axillary incision appears to be healing well.    POSTURE:  Forward head, rounded shoulders   LYMPHEDEMA ASSESSMENT:    A/PROM Right 11/27/2021 Left 11/27/2021 Right 02/03/2022  Left  02/03/2022 Right 02/21/22 Left 02/21/22 Right 03/03/2022 Left 03/03/2022 Right 03/26/22 Left  03/26/22 Right  04/23/2022 Left 04/23/2022  Shoulder extension 38 48 39 32 61 54 51 58   60 52  Shoulder flexion 157 152 100 51 148 115 163 143  155 167 159  Shoulder abduction 165 163 60 58 169 120 179 133  165 180 179  Shoulder internal rotation 67 62 63 NT 54 55 68 64   68 66  Shoulder external rotation 77 77 84 NT 90 78 97 88   99 95                          (Blank rows = not tested)    LYMPHEDEMA ASSESSMENTS:    LANDMARK RIGHT 11/27/2021 LEFT 11/27/2021 RIGHT 02/03/2022 LEFT 02/03/2022  10 cm proximal to olecranon process 32 31.9 31.8 31.5  Olecranon process 27 27 27.7 26.4  10 cm proximal to ulnar styloid process 23.7 23 23.3 22.7  Just proximal to ulnar styloid process 16.4 16.8 16.1 16.7  Across hand at thumb web space 19 18.7 19 18.8  At base of 2nd digit 6.8 7.5 6.7 7.5  (Blank rows = not tested)   Treatment: 04/23/2022 Left trunk observed for swelling but none noted today. Pt was tender in left lats and feels very muscular.  Assessed shoulder ROM with very good results bilaterally. Soft tissue mobilization to left UT, pectorals and lats in supine and  UT and scapular area in right S/L.Marland Kitchen Instructed pt in standing theraband;Scapular retraction, shoulder extension, bilateral ER with yellow x 10, standing shoulder elevation x10. Also right SB stretch with left arm overhead  standing or right SL,and left pectoral stretch on wall. Pt was given illustrated instructions    04/16/2022 Soft tissue mobilization to left UT, pectorals and lats in supine and UT and scapular area in right S/L.Marland Kitchen   PROM to left shoulder flexion, abduction, and D2, ER MLD briefly: bilateral supraclavicular, 5 diaphragmatic breaths, left inguinal LN's and Left axillo-inguinal pathway and posterior interaxillary pathwaydirecting fluid away from lateral trunk repeating pathway and ending with inguinal LN's. LTR x 5 to  the right,Supine scapular series with yellow band x 5 except sword.   03/26/22 Soft tissue mobilization to left UT, pectorals and lats in supine and UT and scapular area in right S/L.Lt scapular mobilization with sig tightness and decreased movement here limiting abduction in sidelying.   PROM to left shoulder flexion, abduction, and D2. Multiple VC's to pt to relax her arm, and significant guarding with twitching noted in lat and pectoralis MLD briefly: bilateral supraclavicular, 5 diaphragmatic breaths, left inguinal LN's and Left axillo-inguinal pathway directing fluid away from lateral trunk repeating pathway and ending with inguinal LN's.  TE:  LTR x 5 with goal post arms  Supine dowel flexion x 8  Supine protraction/retraction x 5 with sig vcs and tcs needed  Practiced scapular clock in seated which pt had difficulty isolating   03/24/22: MANUAL Soft tissue  mobilization to left UT, pectorals and lats in supine and UT and scapular area in right S/L. PROM to left shoulder flexion, abduction, and D2. Multiple VC's to pt to relax her arm, improved with practice MLD: bilateral supraclavicular, 5 diaphragmatic breaths, left inguinal LN's and Left axillo-inguinal pathway directing fluid away from lateral trunk repeating pathway and ending with inguinal LN's. Pt did not practice secondary to right hand red and blistered from "chemo hand"  03/17/2022   Pts right hand very blistered from chemo. Took photo and messaged MD as pt wanted to know what to put on it. MANUAL Soft tissue mobilization to left UT, pectorals and lats in supine and Ut and scapular area in right SL.Marland Kitchen PROM to left shoulder flexion, scaption, abduction, IR and ER. Multiple VC's to pt to relax her arm, improved with practice MLD bilateral supraclavicular, 5 diaphragmatic breaths, left inguinal LN's and Left axillo-inguinal pathway directing fluid away from lateral trunk repeating pathway and ending with inguinal LN's. Pt did not  practice secondary to right hand red and blistered from "chemo hand" Lower trunk rotation bilaterally x 5 with arms outstretched and with goal post position            Surgery type/Date: 01/07/2022 Number of lymph nodes removed: 2 on left Current/past treatment (chemo, radiation, hormone therapy): Radiation to chest for Non-hodgkins lymphoma in 1985 Other symptoms:  Heaviness/tightness Yes Pain Yes Pitting edema No Infections No Decreased scar mobility Yes Stemmer sign No     PATIENT EDUCATION:  Access Code: [email or web address] URL: [email or web address] Date: 04/23/2022 Prepared by: Cheral Almas  Exercises - Single Arm Doorway Pec Stretch at 60 Elevation  - 1 x daily - 7 x weekly - 1 sets - 3 reps - 15 hold Also standing scapular retraction, shoulder extension and bilateral ER x 10, shoulder elevation x 10- Chest Stretch with Shoulder Squeeze  - 1 x daily - 7 x weekly - 5 reps - 5-10 hold  Person educated: Patient  Education method: Explanation, demonstration, handout Education comprehension: verbalized, demonstrated understanding, and pt returned demonstration      HOME EXERCISE PROGRAM:  Reviewed previously given post op HEP. Performed supine clasped hands flexion and stargazer today with improved ease 4/14- issued supine dowel flexion and abduction , 03/05/2022 Lower trunk rotation with arms outstretched; 03/10/22: Self MLD to Lt lateral trunk, 03/26/22 scapular clock movements  04/16/2022 Supine scapular series except sword x 5. Gave yellow band 04/23/2022 Standing scapular retraction, shoulder extension, bilateral ER with yellow, elevation with back against wall, SB stretch with arm overhead, and single arm pec stretch. ASSESSMENT:   CLINICAL IMPRESSION: Pt has shown excellent improvement in bilateral shoulder ROM. She is still limited with left shoulder extension with complaints of tightness. She has multiple tender points in left scapular region, and lats . HEP was updated  today for strengthening/stretching and pt was able to return demonstrate all. She has chemo on Friday and will skip a week so she can feel better. She will benefit from continued skilled PT for several weeks to alleviate tightness/tenderness and ROM limitation and to improve functional use of her arms.    Pt will benefit from skilled therapeutic intervention to improve on the following deficits: Decreased knowledge of precautions, impaired UE functional use, pain, decreased ROM, postural dysfunction.    PT treatment/interventions: ADL/Self care home management, Therapeutic exercises, Therapeutic activity, Neuromuscular re-education, Balance training, Gait training, Patient/Family education, and Joint mobilization         GOALS: Goals reviewed with  patient? Yes   LONG TERM GOALS:  (STG=LTG)   GOALS Name Target Date Goal status  1 Pt will demonstrate she has regained full shoulder ROM and function post operatively compared to baselines.  Baseline: 03/31/2022 MET for ROM except left extension  2 Patient will increase bilateral shoulder flexion to be >/= 140 degrees for increased ease reaching overhead.   03/03/2022 MET 03/03/2022  3 Patient will increase bilateral shoulder abduction to be >/= 150 degrees for increased ability to obtain radiation positioning. 03/31/2022 MET  4 Patient will improve her DASH score to be </= 10 for improved overall UE function. 03/31/2022 Ongoing 41% today 18% on 03/26/22 22% 04/23/2022  5 Pt will be able to perform sidelying abduction with proper scapular movement to limit impingement 04/23/22 ongoing        PLAN: PT FREQUENCY/DURATION: 1x/week for 6 weeks, avoiding weeks with chemo   PLAN FOR NEXT SESSION: Add scapular movement and protraction/retraction work, cont gentle PROM, STM to Lt  axillary and periscapular area; cont ball roll up wall, modified downward dog on wall  progress strength and use of Ue's, Add reach to cabinet, ball rolls on  wall       Claris Pong, PT 04/23/2022, 3:21 PM    Self manual lymph drainage: Perform this sequence at least once a day.  Only give enough pressure to your skin to make the skin move.  Hug yourself.  Do circles at your neck just above your collarbones.  Repeat this 10 times  Diaphragmatic - Supine   Inhale through nose making navel move out toward hands. Exhale through puckered lips, hands follow navel in. Repeat _5__ times. Rest _10__ seconds between repeats.  LEG: Inguinal Nodes Stimulation   With small finger side of hand against hip crease on involved side, gently perform circles at the crease. Repeat __10_ times.   Copyright  VHI. All rights reserved.  Axilla to Inguinal Nodes - Sweep   On involved side, pump _4__ times from armpit along side of trunk to hip crease.      Finish by doing the circles  same side groin.  Cancer Rehab [phone]

## 2022-04-23 NOTE — Patient Instructions (Addendum)
Please schedule follow up scheduled with myself in 3 months.  If my schedule is not open yet, we will contact you with a reminder closer to that time. Please call [phone] if you haven't heard from [place] a month before.   Before your next visit I would like you to have: CT [facility] - to follow up on nodule. We will schedule and call you.  Take the albuterol nebulizer every 4 to 6 hours as needed for wheezing or shortness of breath. You can also take it 15 minutes before exercise or exertional activity. Side effects include heart racing or pounding, jitters or anxiety. If you have a history of an irregular heart rhythm, it can make this worse. Can also give some patients a hard time sleeping.  If you feel you're taking it multiple times a day and it's helping you - call me sooner and I will order breathing testing.

## 2022-04-23 NOTE — Progress Notes (Signed)
Erika Hodge    [id number]    1966/01/07  Primary Care Physician:Hague, Rosalyn Charters, MD  Referring Physician: Bonnita Nasuti, MD [address] [place],  [place] [address] Reason for Consultation: pulmonary nodule Date of Consultation: 04/23/2022  Chief complaint:   Chief Complaint  Patient presents with   [facility] 2 weeks ago and there was some on lungs on CT and they wanted her to F/u.     HPI: KINSLY HILD is a 56 y.o. woman with a history of triple negative breast cancer s/p bilateral mastectomy in Feb 2023 (still on chemotherapy) who presents for new patient evaluation.   Has history of NHL s/p radiation.   She had a recent admission end of May for bronchiectasis with exacerbation in the setting of febrile neutropenia. She was treated with antibiotics and discharged home on oral agents. She is being sent here for further evaluation of an abnormal CT Chest which showed bilateral pulmonary nodules the largest of which was 6.5 mm in size.   Feeling more short of breath. She does have some wheezing and has an albuterol inhaler but she's not sure if it helps. She does think albuterol nebs help more. Denies coughing or chest tightness.    She has three more treatments left on her chemo.   No childhood respiratory disease. Or asthma.  She had bronchitis once 7 years ago.   Never hospitalized for breathing prior to this.   She wonders if she is allergic to the cat. She does take a claritin daily for seasonal allergies. 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 3 dogs and cat.  Smoking history: previously 0.5 ppd x 10 years = 5 pack years.   Social History   Occupational History   Not on file  Tobacco Use   Smoking status: Former    Packs/day: 0.50    Years: 15.00    Total pack years: 7.50    Types:  Cigarettes    Quit date: 2016    Years since quitting: 7.4    Passive exposure: Never   Smokeless tobacco: Never  Substance and Sexual Activity   Alcohol use: Yes    Comment: occas   Drug use: Never   Sexual activity: Not on file    Relevant family history:  Family History  Problem Relation Age of Onset   Breast cancer Mother        dx. 37s, metastasized   Throat cancer Father        dx. 57s   Cancer Maternal Uncle        unknown type   Lung disease Neg Hx     Past Medical History:  Diagnosis Date   Anxiety    Complication of anesthesia    HA   Non Hodgkin's lymphoma ([place])    Psoriasis     Past Surgical History:  Procedure Laterality Date   BREAST BIOPSY Left 11/20/2021   BREAST RECONSTRUCTION WITH PLACEMENT OF TISSUE EXPANDER AND ALLODERM Bilateral 01/07/2022   Procedure: BILATERAL BREAST RECONSTRUCTION WITH PLACEMENT OF TISSUE EXPANDERS AND ALLODERM;  Surgeon: Irene Limbo, MD;  Location: [facility];  Service: Plastics;  Laterality: Bilateral;   NIPPLE SPARING MASTECTOMY Right 01/07/2022   Procedure: RIGHT NIPPLE SPARING MASTECTOMY;  Surgeon: Erroll Luna, MD;  Location: [facility];  Service: General;  Laterality: Right;   NIPPLE SPARING MASTECTOMY WITH SENTINEL LYMPH NODE BIOPSY Left 01/07/2022   Procedure: LEFT NIPPLE SPARING MASTECTOMY WITH SENTINEL LYMPH NODE BIOPSY;  Surgeon: Erroll Luna, MD;  Location: [facility];  Service: General;  Laterality: Left;   TUBAL LIGATION       Physical Exam: Blood pressure 110/60, pulse 98, temperature (!) 97.5 F (36.4 C), temperature source Oral, height '5\' 3"'$  (1.6 m), weight 180 lb (81.6 kg), SpO2 98 %. Gen:      No acute distress, chemo related alopecia, fatigued ENT:  no nasal polyps, mucus membranes moist Lungs:    No increased respiratory effort, symmetric chest wall excursion, clear to auscultation bilaterally, no wheezes or crackles CV:         Regular rate and  rhythm; no murmurs, rubs, or gallops.  No pedal edema Abd:      + bowel sounds; soft, non-tender; no distension MSK: no acute synovitis of DIP or PIP joints, no mechanics hands.  Skin:      Warm and dry; no rashes Neuro: normal speech, no focal facial asymmetry Psych: alert and oriented x3, normal mood and affect   Data Reviewed/Medical Decision Making:  Independent interpretation of tests: Imaging:  Review of patient's CT Chest Angio May 2023 images revealed midl bilateral bronchiectasis. Pulmonary nodules as noted below. The patient's images have been independently reviewed by me.        No data to display          Labs:  Lab Results  Component Value Date   WBC 15.2 (H) 04/18/2022   HGB 10.6 (L) 04/18/2022   HCT 33.4 (L) 04/18/2022   MCV 93.0 04/18/2022   PLT 447 (H) 04/18/2022   Lab Results  Component Value Date   NA 137 04/18/2022   K 4.1 04/18/2022   CL 104 04/18/2022   CO2 25 04/18/2022     Immunization status:   There is no immunization history on file for this patient.   I reviewed prior external note(s) from Oncology, hospital stay  I reviewed the result(s) of the labs and imaging as noted above.   I have ordered CT Chest  Assessment:  Pulmonary nodules Breast cancer s/p bilateral mastectomy  Shortness of breath/fatigue  Plan/Recommendations: Trial of albuterol nebulizer for shrotness of breath. Unclear if this is related to fatigue. She will try it and if symptoms don't improve we will get PFTs.  Repeat CT Chest in 3 months. Nodule differential includes reactive, inflammatory, infectious or malignant.  Will see her back after this.   Fleischner Society Guidelines 2017 MacMahon H, Naidich DP, Goo JM, et al. Guidelines for management of incidental pulmonary nodules detected on CT Images: From the Fleischner Society. Radiology 2017; [id number]. Copyright  2017 Radiological Society of [place].  Evaluation of the incidental solid pulmonary nodule  in adults Nodule size (mm) Low (<5%) cancer risk High (>65%) or moderate (5 to 65%) cancer risk  Solitary  <6 No routine follow-up Optional CT at 12 months  6 to 8 CT at 6 to 12 months, then consider CT at 18 to 24 months CT at 6 to 12 months, then CT at 18 to 24 months  >8 CT at 3 months, then at 9 and 24 months FDG PET/CT, biopsy or resection  Multiple (evaluation based on largest nodule)  <6 No routine follow-up Optional CT at 12 months  ?6 CT at 3 to 6 months, then consider  CT at 18 to 24 months CT at 3 to 6 months, then CT at 57 to 24 months   Not applicable to patients age <35 years, in lung cancer screening, with immunosuppression, known pulmonary disease or symptoms or active primary cancer. Chest CT performed without contrast as contiguous 1 mm sections using low dose technique. Growing or FDG-avid nodules should undergo biopsy or resection. Growth is defined as >1.5 mm increase. Nodules unchanged for >2 years are benign.   CT: computed tomography; FDG: 18-fluorodeoxyglucose; PET: positron emission tomography.    Return to Care: Return in about 3 months (around 07/24/2022).  Lenice Llamas, MD Pulmonary and [place]  CC: Hague, Rosalyn Charters, MD

## 2022-04-24 MED FILL — Dexamethasone Sodium Phosphate Inj 100 MG/10ML: INTRAMUSCULAR | Qty: 1 | Status: AC

## 2022-04-25 ENCOUNTER — Inpatient Hospital Stay: Payer: BC Managed Care – PPO

## 2022-04-25 ENCOUNTER — Other Ambulatory Visit: Payer: Self-pay

## 2022-04-25 ENCOUNTER — Inpatient Hospital Stay (HOSPITAL_BASED_OUTPATIENT_CLINIC_OR_DEPARTMENT_OTHER): Payer: BC Managed Care – PPO | Admitting: Hematology

## 2022-04-25 ENCOUNTER — Encounter: Payer: Self-pay | Admitting: Hematology

## 2022-04-25 VITALS — BP 130/68 | HR 97 | Temp 98.0°F | Resp 19 | Ht 63.0 in | Wt 180.5 lb

## 2022-04-25 DIAGNOSIS — C50212 Malignant neoplasm of upper-inner quadrant of left female breast: Secondary | ICD-10-CM

## 2022-04-25 DIAGNOSIS — Z170 Estrogen receptor positive status [ER+]: Secondary | ICD-10-CM | POA: Diagnosis not present

## 2022-04-25 DIAGNOSIS — C50911 Malignant neoplasm of unspecified site of right female breast: Secondary | ICD-10-CM | POA: Diagnosis not present

## 2022-04-25 DIAGNOSIS — C50919 Malignant neoplasm of unspecified site of unspecified female breast: Secondary | ICD-10-CM

## 2022-04-25 LAB — CBC WITH DIFFERENTIAL (CANCER CENTER ONLY)
Abs Immature Granulocytes: 0.04 10*3/uL (ref 0.00–0.07)
Basophils Absolute: 0 10*3/uL (ref 0.0–0.1)
Basophils Relative: 0 %
Eosinophils Absolute: 0.1 10*3/uL (ref 0.0–0.5)
Eosinophils Relative: 1 %
HCT: 34.4 % — ABNORMAL LOW (ref 36.0–46.0)
Hemoglobin: 11 g/dL — ABNORMAL LOW (ref 12.0–15.0)
Immature Granulocytes: 0 %
Lymphocytes Relative: 14 %
Lymphs Abs: 1.4 10*3/uL (ref 0.7–4.0)
MCH: 30.1 pg (ref 26.0–34.0)
MCHC: 32 g/dL (ref 30.0–36.0)
MCV: 94 fL (ref 80.0–100.0)
Monocytes Absolute: 1.2 10*3/uL — ABNORMAL HIGH (ref 0.1–1.0)
Monocytes Relative: 12 %
Neutro Abs: 7 10*3/uL (ref 1.7–7.7)
Neutrophils Relative %: 73 %
Platelet Count: 467 10*3/uL — ABNORMAL HIGH (ref 150–400)
RBC: 3.66 MIL/uL — ABNORMAL LOW (ref 3.87–5.11)
RDW: 16.5 % — ABNORMAL HIGH (ref 11.5–15.5)
WBC Count: 9.7 10*3/uL (ref 4.0–10.5)
nRBC: 0 % (ref 0.0–0.2)

## 2022-04-25 LAB — CMP (CANCER CENTER ONLY)
ALT: 12 U/L (ref 0–44)
AST: 13 U/L — ABNORMAL LOW (ref 15–41)
Albumin: 4.2 g/dL (ref 3.5–5.0)
Alkaline Phosphatase: 67 U/L (ref 38–126)
Anion gap: 9 (ref 5–15)
BUN: 11 mg/dL (ref 6–20)
CO2: 23 mmol/L (ref 22–32)
Calcium: 9.9 mg/dL (ref 8.9–10.3)
Chloride: 104 mmol/L (ref 98–111)
Creatinine: 0.73 mg/dL (ref 0.44–1.00)
GFR, Estimated: >60 mL/min (ref >60)
Glucose, Bld: 137 mg/dL — ABNORMAL HIGH (ref 70–99)
Potassium: 3.7 mmol/L (ref 3.5–5.1)
Sodium: 136 mmol/L (ref 135–145)
Total Bilirubin: 0.3 mg/dL (ref 0.3–1.2)
Total Protein: 7.4 g/dL (ref 6.5–8.1)

## 2022-04-25 MED ORDER — SODIUM CHLORIDE 0.9 % IV SOLN
65.0000 mg/m2 | Freq: Once | INTRAVENOUS | Status: AC
  Administered 2022-04-25: 120 mg via INTRAVENOUS
  Filled 2022-04-25: qty 12

## 2022-04-25 MED ORDER — PALONOSETRON HCL INJECTION 0.25 MG/5ML
0.2500 mg | Freq: Once | INTRAVENOUS | Status: AC
  Administered 2022-04-25: 0.25 mg via INTRAVENOUS
  Filled 2022-04-25: qty 5

## 2022-04-25 MED ORDER — SODIUM CHLORIDE 0.9 % IV SOLN
600.0000 mg/m2 | Freq: Once | INTRAVENOUS | Status: AC
  Administered 2022-04-25: 1140 mg via INTRAVENOUS
  Filled 2022-04-25: qty 57

## 2022-04-25 MED ORDER — SODIUM CHLORIDE 0.9 % IV SOLN: Freq: Once | INTRAVENOUS | Status: AC

## 2022-04-25 MED ORDER — OXYCODONE HCL 5 MG PO TABS: 2.5000 mg | ORAL_TABLET | Freq: Three times a day (TID) | ORAL | 0 refills | Status: DC | PRN

## 2022-04-25 MED ORDER — SODIUM CHLORIDE 0.9 % IV SOLN
10.0000 mg | Freq: Once | INTRAVENOUS | Status: AC
  Administered 2022-04-25: 10 mg via INTRAVENOUS
  Filled 2022-04-25: qty 10

## 2022-04-25 NOTE — Progress Notes (Signed)
[place]   [phone] Fax:([phone]) [phone]   Clinic Follow up Note   Patient Care Team: Bonnita Nasuti, MD as PCP - General (Internal Medicine) Erroll Luna, MD as Consulting Physician (General Surgery) Truitt Merle, MD as Consulting Physician (Hematology) Gery Pray, MD as Consulting Physician (Radiation Oncology) Mauro Kaufmann, RN as Oncology Nurse Navigator Rockwell Germany, RN as Oncology Nurse Navigator  Date of Service:  04/25/2022  CHIEF COMPLAINT: f/u of bilateral breast cancer  CURRENT THERAPY:  Adjuvant TC, q21d, starting 02/12/22  ASSESSMENT & PLAN:  Erika Hodge is a 56 y.o. post-menopausal female with   1. 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 -she opted to proceed with b/l mastectomies with reconstruction on 01/07/22 with Dr. Brantley Stage and Dr. Iran Planas. Pathology revealed b/l breast cancers. Left breast showed 2.1 cm invasive and in situ ductal carcinoma. Margins and lymph nodes negative (0/2). Repeated ER/PR/HER2 were all negative (ER 10% weak (+) on biopsy) -she developed left IMF necrotic tissue, excised by Dr. Iran Planas on 01/27/22. -because she was likely treated with adriamycin for her lymphoma, she is not a candidate for more of this medicine. Therefore, I recommend 6 cycles of TC (docetaxel and cytoxan). She declined port placement.  -she began TC on 02/12/22. She tolerates fairly overall with moderate fatigue and shortness of breath. She also experienced intense bone pain after GCF-S injection and it was held for cycle 3. She was hospitalized after C3 with neutropenia and sepsis.  I reviewed her hospital course.  She has recovered well now  -labs reviewed, overall stable.  I recommend adding G-CSF back after chemo to reduce her risk of infection.  I will call in oxycodone for her to use  for several days after GCF-S injection. -Patient had many questions about other treatment options for her triple negative breast cancer, including immunotherapy (she wanted to try), capecitabine etc.  We discussed in details. -We also discussed cancer surveillance after she completes adjuvant chemotherapy   2. Invasive lobular carcinoma of right breast, stage I pT1c, ER weakly+/PR+/Her2-, and DCIS  -incidental finding on b/l mastectomies on 01/07/22 for left breast cancer. Pathology from right breast showed: 1.1 cm invasive lobular carcinoma, grade 1, in LIQ; 1 cm DCIS, intermediate grade, in UIQ. ILC showed ER 30% weakly positive, PR 80% moderately positive, Her2 negative. ILC focally involves adjacent anterior margin.  -we will discuss antiestrogen therapy after she finishes chemo.  She reluctant to try.   3. 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 -per pt, she also received three years of systemic treatment. She reports she was treated at [facility]. Her records were too old to be received, but she likely received adriamycin during that time.   4. Psoriasis  -previously on Tremfya injections, was told by dermatology that she could not restart due to h/o cancer. -I do not recommend immunotherapy because of this. -followed by Dr. Abner Greenspan with [facility], per pt     PLAN:  -proceed with C4 TC today at same dose as cycle 3, and restart Udenyca after chemo. -I called in oxycodone for her G-CSF related to bone pain. -lab, f/u, and TC in 3 and 6 weeks   No problem-specific Assessment & Plan notes found for this encounter.   SUMMARY OF ONCOLOGIC HISTORY: Oncology History  Overview Note   Cancer Staging  Invasive lobular carcinoma of right breast, stage 1 ([place]) Staging form: Breast, AJCC 8th Edition - Clinical stage from 01/07/2022: Stage Unknown (cT1c, cNX, cM0, G1, ER+, PR+, HER2-) - Signed by Truitt Merle, MD on  01/24/2022  Malignant neoplasm of upper-inner quadrant of left breast in female, estrogen receptor positive ([place]) Staging form: Breast, AJCC 8th Edition - Clinical stage from 11/20/2021: Stage IA (cT1c, cN0, cM0, G2, ER+, PR-, HER2-) - Signed by Truitt Merle, MD on 11/26/2021 - Pathologic stage from 01/07/2022: Stage IIA (pT2, pN0, cM0, G2, ER-, PR-, HER2-) - Signed by Truitt Merle, MD on 01/24/2022     Malignant neoplasm of upper-inner quadrant of left breast in female, estrogen receptor positive ([place])  11/05/2021 Mammogram   CLINICAL DATA:  Screening recall for a possible left breast asymmetry.   EXAM: DIGITAL DIAGNOSTIC UNILATERAL LEFT MAMMOGRAM WITH TOMOSYNTHESIS AND CAD; ULTRASOUND LEFT BREAST LIMITED  IMPRESSION: 1. Irregular 1.2 cm mass in the left breast at 11 o'clock, 2 cm the nipple, suspicious for malignancy.   11/20/2021 Cancer Staging   Staging form: Breast, AJCC 8th Edition - Clinical stage from 11/20/2021: Stage IA (cT1c, cN0, cM0, G2, ER+, PR-, HER2-) - Signed by Truitt Merle, MD on 11/26/2021 Stage prefix: Initial diagnosis Histologic grading system: 3 grade system   11/20/2021 Initial Biopsy   Diagnosis Breast, left, needle core biopsy, 11 o'clock, ribbon clip - INVASIVE DUCTAL CARCINOMA - DUCTAL CARCINOMA IN SITU - SEE COMMENT Microscopic Comment based on the biopsy, the carcinoma appears Nottingham grade 2 of 3 and measures 0.6 cm in greatest linear extent.  PROGNOSTIC INDICATORS Results: The tumor cells are NEGATIVE for Her2 (1+). Estrogen Receptor: 10%, POSITIVE, WEAK STAINING INTENSITY Progesterone Receptor: 0%, NEGATIVE Proliferation Marker Ki67: 20%   11/26/2021 Initial Diagnosis   Malignant neoplasm of upper-inner quadrant of left breast in female, estrogen receptor positive ([place])   11/27/2021 Genetic Testing   [facility] [facility] was Negative. Report date is 12/11/2021.  The CancerNext gene panel offered by [employer] includes sequencing, rearrangement  analysis, and RNA analysis for the following 36 genes:   APC, ATM, AXIN2, BARD1, BMPR1A, BRCA1, BRCA2, BRIP1, CDH1, CDK4, CDKN2A, CHEK2, DICER1, HOXB13, EPCAM, GREM1, MLH1, MSH2, MSH3, MSH6, MUTYH, NBN, NF1, NTHL1, PALB2, PMS2, POLD1, POLE, PTEN, RAD51C, RAD51D, RECQL, SMAD4, SMARCA4, STK11, and TP53.    01/07/2022 Cancer Staging   Staging form: Breast, AJCC 8th Edition - Pathologic stage from 01/07/2022: Stage IIA (pT2, pN0, cM0, G2, ER-, PR-, HER2-) - Signed by Truitt Merle, MD on 01/24/2022 Stage prefix: Initial diagnosis Histologic grading system: 3 grade system Residual tumor (R): R0 - None   01/07/2022 Definitive Surgery   FINAL MICROSCOPIC DIAGNOSIS:   A. BREAST, RIGHT NIPPLE, BIOPSY:  - Benign breast tissue.  - No malignancy identified.   B. BREAST, LEFT NIPPLE, BIOPSY:  - Benign breast tissue.  - No malignancy identified.   C. BREAST, RIGHT, MASTECTOMY:  - Invasive lobular carcinoma, 1.1 cm.  - Ductal carcinoma in situ, 1 cm.  - Invasive carcinoma focally extends to the anterior margin.  - Fibrocystic changes with usual ductal hyperplasia and calcifications.  - Sclerosing adenosis and fibroadenomatoid change.  - See oncology table and comment.   D. LYMPH NODE, LEFT AXILLARY, SENTINEL, EXCISION:  - One lymph node negative for metastatic carcinoma (0/1).   E. LYMPH NODE, LEFT AXILLARY, SENTINEL, EXCISION:  - One lymph node negative for metastatic carcinoma (0/1).   F. BREAST, LEFT, MASTECTOMY:  - Invasive and  in situ ductal carcinoma, 2.1 cm.  - Margins negative for carcinoma.  - See oncology table.    01/07/2022 Receptors her2   F1. PROGNOSTIC INDICATOR RESULTS:   The tumor cells are NEGATIVE for Her2 (0).   Estrogen Receptor: NEGATIVE  Progesterone Receptor: NEGATIVE  Proliferation Marker Ki-67: 10%    02/12/2022 -  Chemotherapy   Patient is on Treatment Plan : BREAST TC q21d     Invasive lobular carcinoma of right breast, stage 1 ([place])  11/27/2021 Genetic Testing    [employer] [facility] was Negative. Report date is 12/11/2021.  The CancerNext gene panel offered by [employer] includes sequencing, rearrangement analysis, and RNA analysis for the following 36 genes:   APC, ATM, AXIN2, BARD1, BMPR1A, BRCA1, BRCA2, BRIP1, CDH1, CDK4, CDKN2A, CHEK2, DICER1, HOXB13, EPCAM, GREM1, MLH1, MSH2, MSH3, MSH6, MUTYH, NBN, NF1, NTHL1, PALB2, PMS2, POLD1, POLE, PTEN, RAD51C, RAD51D, RECQL, SMAD4, SMARCA4, STK11, and TP53.    01/07/2022 Cancer Staging   Staging form: Breast, AJCC 8th Edition - Clinical stage from 01/07/2022: Stage Unknown (cT1c, cNX, cM0, G1, ER+, PR+, HER2-) - Signed by Truitt Merle, MD on 01/24/2022 Stage prefix: Initial diagnosis Histologic grading system: 3 grade system   01/07/2022 Definitive Surgery   FINAL MICROSCOPIC DIAGNOSIS:   A. BREAST, RIGHT NIPPLE, BIOPSY:  - Benign breast tissue.  - No malignancy identified.   B. BREAST, LEFT NIPPLE, BIOPSY:  - Benign breast tissue.  - No malignancy identified.   C. BREAST, RIGHT, MASTECTOMY:  - Invasive lobular carcinoma, 1.1 cm.  - Ductal carcinoma in situ, 1 cm.  - Invasive carcinoma focally extends to the anterior margin.  - Fibrocystic changes with usual ductal hyperplasia and calcifications.  - Sclerosing adenosis and fibroadenomatoid change.  - See oncology table and comment.   D. LYMPH NODE, LEFT AXILLARY, SENTINEL, EXCISION:  - One lymph node negative for metastatic carcinoma (0/1).   E. LYMPH NODE, LEFT AXILLARY, SENTINEL, EXCISION:  - One lymph node negative for metastatic carcinoma (0/1).   F. BREAST, LEFT, MASTECTOMY:  - Invasive and in situ ductal carcinoma, 2.1 cm.  - Margins negative for carcinoma.  - See oncology table.    01/07/2022 Receptors her2   C7.  PROGNOSTIC INDICATOR RESULTS:   The tumor cells are NEGATIVE for Her2 (1+).   Estrogen Receptor: POSITIVE, 30% WEAK STAINING INTENSITY  Progesterone Receptor: POSITIVE, 80% MODERATE STAINING INTENSITY  Proliferation Marker  Ki-67: <5%    01/24/2022 Initial Diagnosis   Invasive lobular carcinoma of right breast, stage 1 ([place])      INTERVAL HISTORY:  [facility] is here for a follow up of breast cancer. She was last seen by PA Murray Hodgkins on 04/18/22. She presents to the clinic accompanied by her husband. She reports she is feeling better.    All other systems were reviewed with the patient and are negative.  MEDICAL HISTORY:  Past Medical History:  Diagnosis Date   Anxiety    Complication of anesthesia    HA   Non Hodgkin's lymphoma ([place])    Psoriasis     SURGICAL HISTORY: Past Surgical History:  Procedure Laterality Date   BREAST BIOPSY Left 11/20/2021   BREAST RECONSTRUCTION WITH PLACEMENT OF TISSUE EXPANDER AND ALLODERM Bilateral 01/07/2022   Procedure: BILATERAL BREAST RECONSTRUCTION WITH PLACEMENT OF TISSUE EXPANDERS AND ALLODERM;  Surgeon: Irene Limbo, MD;  Location: [facility];  Service: Plastics;  Laterality: Bilateral;   NIPPLE SPARING MASTECTOMY Right 01/07/2022   Procedure: RIGHT NIPPLE SPARING MASTECTOMY;  Surgeon: Erroll Luna, MD;  Location: [place];  Service: General;  Laterality: Right;   NIPPLE SPARING MASTECTOMY WITH SENTINEL LYMPH NODE BIOPSY Left 01/07/2022   Procedure: LEFT NIPPLE SPARING MASTECTOMY WITH SENTINEL LYMPH NODE BIOPSY;  Surgeon: Erroll Luna, MD;  Location: [place];  Service: General;  Laterality: Left;   TUBAL LIGATION      I have reviewed the social history and family history with the patient and they are unchanged from previous note.  ALLERGIES:  is allergic to amoxicillin, ibuprofen, ciprofloxacin, and codeine.  MEDICATIONS:  Current Outpatient Medications  Medication Sig Dispense Refill   oxyCODONE (OXY IR/ROXICODONE) 5 MG immediate release tablet Take 0.5-1 tablets (2.5-5 mg total) by mouth every 8 (eight) hours as needed for severe pain. 20 tablet 0   acetaminophen (TYLENOL) 500 MG tablet Take  1,000 mg by mouth every 6 (six) hours as needed for mild pain or fever.     albuterol (PROVENTIL) (2.5 MG/3ML) 0.083% nebulizer solution Take 3 mLs (2.5 mg total) by nebulization every 6 (six) hours as needed for wheezing or shortness of breath. 75 mL 6   albuterol (VENTOLIN HFA) 108 (90 Base) MCG/ACT inhaler Inhale 2 puffs into the lungs every 6 (six) hours as needed for wheezing or shortness of breath. (Patient not taking: Reported on 04/23/2022) 8 g 2   ALPRAZolam (XANAX) 0.5 MG tablet Take 0.5 mg by mouth daily as needed for anxiety or sleep.     ASHWAGANDHA PO Take 1 tablet by mouth daily.     Calcipotriene-Betameth Diprop (ENSTILAR) 0.005-0.064 % FOAM Apply 1 application topically as needed (apply to psoriasis area as needed).     dexamethasone (DECADRON) 4 MG tablet Take 1 tablet (4 mg total) by mouth 2 (two) times daily. Start the day before Taxotere. Then change to one tablet daily after chemo for 3 days. 30 tablet 1   doxycycline (VIBRA-TABS) 100 MG tablet Take 1 tablet (100 mg total) by mouth 2 (two) times daily for 7 days. 14 tablet 0   FLUoxetine (PROZAC) 20 MG capsule Take 20 mg by mouth daily.     fluticasone (FLONASE) 50 MCG/ACT nasal spray Place 2 sprays into both nostrils daily. 15.8 mL 2   loratadine (CLARITIN) 10 MG tablet Take 10 mg by mouth daily.     omeprazole (PRILOSEC) 40 MG capsule Take 40 mg by mouth daily.     ondansetron (ZOFRAN) 8 MG tablet Take 1 tablet (8 mg total) by mouth 2 (two) times daily as needed for refractory nausea / vomiting. Start on day 3 after chemo. 30 tablet 1   prochlorperazine (COMPAZINE) 10 MG tablet Take 1 tablet (10 mg total) by mouth every 6 (six) hours as needed (Nausea or vomiting). 30 tablet 1   rOPINIRole (REQUIP) 0.5 MG tablet Take 1 tablet (0.5 mg total) by mouth at bedtime. 30 tablet 0   senna-docusate (SENOKOT-S) 8.6-50 MG tablet Take 1 tablet by mouth at bedtime as needed for mild constipation. 30 tablet 0   zolpidem (AMBIEN) 10 MG tablet  Take 10 mg by mouth at bedtime as needed for sleep.     No current facility-administered medications for this visit.    PHYSICAL EXAMINATION: ECOG PERFORMANCE STATUS: 1 - Symptomatic but completely ambulatory  Vitals:   04/25/22 1311  BP: 130/68  Pulse: 97  Resp: 19  Temp: 98 F (36.7 C)  SpO2: 100%   Wt Readings from Last 3 Encounters:  04/25/22 180 lb 8 oz (81.9  kg)  04/23/22 180 lb (81.6 kg)  04/18/22 181 lb 9.6 oz (82.4 kg)     GENERAL:alert, no distress and comfortable SKIN: skin color normal, no rashes or significant lesions EYES: normal, Conjunctiva are pink and non-injected, sclera clear  NEURO: alert & oriented x 3 with fluent speech  LABORATORY DATA:  I have reviewed the data as listed    Latest Ref Rng & Units 04/25/2022   12:14 PM 04/18/2022    9:41 AM 04/09/2022    3:49 AM  CBC  WBC 4.0 - 10.5 K/uL 9.7  15.2  4.3   Hemoglobin 12.0 - 15.0 g/dL 11.0  10.6  9.9   Hematocrit 36.0 - 46.0 % 34.4  33.4  31.5   Platelets 150 - 400 K/uL 467  447  290         Latest Ref Rng & Units 04/25/2022   12:14 PM 04/18/2022    9:41 AM 04/09/2022    3:49 AM  CMP  Glucose 70 - 99 mg/dL 137  105  138   BUN 6 - 20 mg/dL 11  14  9    Creatinine 0.44 - 1.00 mg/dL 0.73  0.80  0.72   Sodium 135 - 145 mmol/L 136  137  138   Potassium 3.5 - 5.1 mmol/L 3.7  4.1  4.1   Chloride 98 - 111 mmol/L 104  104  108   CO2 22 - 32 mmol/L 23  25  23    Calcium 8.9 - 10.3 mg/dL 9.9  9.9  8.6   Total Protein 6.5 - 8.1 g/dL 7.4  7.3    Total Bilirubin 0.3 - 1.2 mg/dL 0.3  0.3    Alkaline Phos 38 - 126 U/L 67  63    AST 15 - 41 U/L 13  14    ALT 0 - 44 U/L 12  13        RADIOGRAPHIC STUDIES: I have personally reviewed the radiological images as listed and agreed with the findings in the report. No results found.    No orders of the defined types were placed in this encounter.  All questions were answered. The patient knows to call the clinic with any problems, questions or concerns. No  barriers to learning was detected. The total time spent in the appointment was 40 minutes.     Truitt Merle, MD 04/25/2022   I, Wilburn Mylar, am acting as scribe for Truitt Merle, MD.   I have reviewed the above documentation for accuracy and completeness, and I agree with the above.

## 2022-04-25 NOTE — Patient Instructions (Signed)
[facility] MEDICAL [facility]COLOG[facility]  Discharge Instructions: Thank you for choosing [facility] to provide your oncology and hematology care.   If you have a lab appointment with the Cancer Center, please go directly to the Cancer Center and check in at the registration area.   Wear comfortable clothing and clothing appropriate for easy access to any Portacath or PICC line.   We strive to give you quality time with your provider. You may need to reschedule your appointment if you arrive late (15 or more minutes).  Arriving late affects you and other patients whose appointments are after yours.  Also, if you miss three or more appointments without notifying the office, you may be dismissed from the clinic at the provider's discretion.      For prescription refill requests, have your pharmacy contact our office and allow 72 hours for refills to be completed.    Today you received the following chemotherapy and/or immunotherapy agents : Taxotere, Cytoxan     To help prevent nausea and vomiting after your treatment, we encourage you to take your nausea medication as directed.  BELOW ARE SYMPTOMS THAT SHOULD BE REPORTED IMMEDIATELY: *FEVER GREATER THAN 100.4 F (38 C) OR HIGHER *CHILLS OR SWEATING *NAUSEA AND VOMITING THAT IS NOT CONTROLLED WITH YOUR NAUSEA MEDICATION *UNUSUAL SHORTNESS OF BREATH *UNUSUAL BRUISING OR BLEEDING *URINARY PROBLEMS (pain or burning when urinating, or frequent urination) *BOWEL PROBLEMS (unusual diarrhea, constipation, pain near the anus) TENDERNESS IN MOUTH AND THROAT WITH OR WITHOUT PRESENCE OF ULCERS (sore throat, sores in mouth, or a toothache) UNUSUAL RASH, SWELLING OR PAIN  UNUSUAL VAGINAL DISCHARGE OR ITCHING   Items with * indicate a potential emergency and should be followed up as soon as possible or go to the Emergency Department if any problems should occur.  Please show the CHEMOTHERAPY ALERT CARD or IMMUNOTHERAPY ALERT CARD at  check-in to the Emergency Department and triage nurse.  Should you have questions after your visit or need to cancel or reschedule your appointment, please contact [facility]  Dept: [phone]  and follow the prompts.  Office hours are 8:00 a.m. to 4:30 p.m. Monday - Friday. Please note that voicemails left after 4:00 p.m. may not be returned until the following business day.  We are closed weekends and major holidays. You have access to a nurse at all times for urgent questions. Please call the main number to the clinic Dept: [phone] and follow the prompts.   For any non-urgent questions, you may also contact your provider using MyChart. We now offer e-Visits for anyone 18 and older to request care online for non-urgent symptoms. For details visit [email or web address].   Also download the MyChart app! Go to the app store, search "MyChart", open the app, select [facility], and log in with your MyChart username and password.  Due to Covid, a mask is required upon entering the hospital/clinic. If you do not have a mask, one will be given to you upon arrival. For doctor visits, patients may have 1 support person aged 18 or older with them. For treatment visits, patients cannot have anyone with them due to current Covid guidelines and our immunocompromised population.   

## 2022-04-28 ENCOUNTER — Other Ambulatory Visit: Payer: Self-pay

## 2022-04-28 ENCOUNTER — Inpatient Hospital Stay: Payer: BC Managed Care – PPO

## 2022-04-28 ENCOUNTER — Encounter: Payer: Self-pay | Admitting: *Deleted

## 2022-04-28 VITALS — BP 154/92 | HR 98 | Temp 98.3°F | Resp 18

## 2022-04-28 DIAGNOSIS — C50212 Malignant neoplasm of upper-inner quadrant of left female breast: Secondary | ICD-10-CM | POA: Diagnosis not present

## 2022-04-28 DIAGNOSIS — Z170 Estrogen receptor positive status [ER+]: Secondary | ICD-10-CM

## 2022-04-28 MED ORDER — PEGFILGRASTIM-CBQV 6 MG/0.6ML ~~LOC~~ SOSY
6.0000 mg | PREFILLED_SYRINGE | Freq: Once | SUBCUTANEOUS | Status: AC
  Administered 2022-04-28: 6 mg via SUBCUTANEOUS
  Filled 2022-04-28: qty 0.6

## 2022-05-07 ENCOUNTER — Ambulatory Visit: Payer: BC Managed Care – PPO

## 2022-05-07 DIAGNOSIS — Z483 Aftercare following surgery for neoplasm: Secondary | ICD-10-CM

## 2022-05-07 DIAGNOSIS — R293 Abnormal posture: Secondary | ICD-10-CM

## 2022-05-07 DIAGNOSIS — C50212 Malignant neoplasm of upper-inner quadrant of left female breast: Secondary | ICD-10-CM

## 2022-05-07 NOTE — Therapy (Signed)
OUTPATIENT PHYSICAL THERAPY TREATMENT NOTE   Patient Name: Erika Hodge MRN: [id number] DOB:17-Jul-1966, 56 y.o., female Today's Date: 05/07/2022  PCP: Bonnita Nasuti, MD REFERRING PROVIDER: Erroll Luna, MD  END OF SESSION:   PT End of Session - 05/07/22 1302     Visit Number 15    Number of Visits 20    Date for PT Re-Evaluation 06/04/22    PT Start Time 4158    PT Stop Time 1356    PT Time Calculation (min) 53 min    Activity Tolerance Patient tolerated treatment well    Behavior During Therapy [facility] for tasks assessed/performed              Past Medical History:  Diagnosis Date   Anxiety    Complication of anesthesia    HA   Non Hodgkin's lymphoma ([place])    Psoriasis    Past Surgical History:  Procedure Laterality Date   BREAST BIOPSY Left 11/20/2021   BREAST RECONSTRUCTION WITH PLACEMENT OF TISSUE EXPANDER AND ALLODERM Bilateral 01/07/2022   Procedure: BILATERAL BREAST RECONSTRUCTION WITH PLACEMENT OF TISSUE EXPANDERS AND ALLODERM;  Surgeon: Irene Limbo, MD;  Location: [place];  Service: Plastics;  Laterality: Bilateral;   NIPPLE SPARING MASTECTOMY Right 01/07/2022   Procedure: RIGHT NIPPLE SPARING MASTECTOMY;  Surgeon: Erroll Luna, MD;  Location: [place];  Service: General;  Laterality: Right;   NIPPLE SPARING MASTECTOMY WITH SENTINEL LYMPH NODE BIOPSY Left 01/07/2022   Procedure: LEFT NIPPLE SPARING MASTECTOMY WITH SENTINEL LYMPH NODE BIOPSY;  Surgeon: Erroll Luna, MD;  Location: [place];  Service: General;  Laterality: Left;   TUBAL LIGATION     Patient Active Problem List   Diagnosis Date Noted   Hypokalemia 04/08/2022   Anemia of chronic disease 04/08/2022   Restless leg syndrome 04/08/2022   Obesity (BMI 30-39.9) 04/08/2022   Bronchiectasis with acute exacerbation ([place]) 04/07/2022   Neutropenic fever ([place]) 04/07/2022   Pulmonary nodules 04/07/2022   Anxiety    Hiatal hernia with  GERD    Invasive lobular carcinoma of right breast, stage 1 (Erika Hodge) 01/24/2022   Genetic testing 12/11/2021   Family history of breast cancer 11/29/2021   Malignant neoplasm of upper-inner quadrant of left breast in female, estrogen receptor positive (Erika Hodge) 11/26/2021    REFERRING DIAG: s/p bilateral Mastectomies with left SLNB  THERAPY DIAG:  Aftercare following surgery for neoplasm  Abnormal posture  Malignant neoplasm of upper-inner quadrant of left breast in female, estrogen receptor positive (Erika Hodge)  PERTINENT HISTORY: Patient was diagnosed on 09/10/2021 with 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 Two negative axillary nodes were removed on the left side. An incidental finding of right breast invasive lobular carcinoma was also found and is ER/PR positive and HER2 negative. It is weakly ER positive, PR negative and HER2 negative with a Ki67 of 20%. She has a left wrist plate from a previous surgery in 2006 and a history of non-Hodgkins Lymphoma in 1985.  PRECAUTIONS: left lymphedema risk, hx of Non-Hodgkins Lymphoma, left wrist plate from prior surgery  SUBJECTIVE:   I had the 2nd infusion Monday. It makes my bones achy especially the larger bones. Next one is July 7th or 9th. I feel so short of breath. I am having the drainage in my throat again. Marland Kitchen PAIN:  Are you having pain? No, just achy  02/03/2022 OBSERVATIONS:  Right breast with increased edema compared to left. Left inferior breast incision appears to be slightly macerated. Non-stick pad applied under each breast to reduce fluid leakage. Left axillary incision appears to be healing well.    POSTURE:  Forward head, rounded shoulders   LYMPHEDEMA ASSESSMENT:    A/PROM Right 11/27/2021 Left 11/27/2021 Right 02/03/2022 Left  02/03/2022 Right 02/21/22 Left 02/21/22 Right 03/03/2022  Left 03/03/2022 Right 03/26/22 Left  03/26/22 Right  04/23/2022 Left 04/23/2022  Shoulder extension 38 48 39 32 61 54 51 58   60 52  Shoulder flexion 157 152 100 51 148 115 163 143  155 167 159  Shoulder abduction 165 163 60 58 169 120 179 133  165 180 179  Shoulder internal rotation 67 62 63 NT 54 55 68 64   68 66  Shoulder external rotation 77 77 84 NT 90 78 97 88   99 95                          (Blank rows = not tested)    LYMPHEDEMA ASSESSMENTS:    LANDMARK RIGHT 11/27/2021 LEFT 11/27/2021 RIGHT 02/03/2022 LEFT 02/03/2022  10 cm proximal to olecranon process 32 31.9 31.8 31.5  Olecranon process 27 27 27.7 26.4  10 cm proximal to ulnar styloid process 23.7 23 23.3 22.7  Just proximal to ulnar styloid process 16.4 16.8 16.1 16.7  Across hand at thumb web space 19 18.7 19 18.8  At base of 2nd digit 6.8 7.5 6.7 7.5  (Blank rows = not tested)   Treatment: 05/07/2022 Soft tissue mobilization to left UT, pectorals and lats in supine and UT and scapular area in right S/L.Marland Kitchen PROM left shoulder flexion, abd, ER. AROM Supine flexion, scaption and horizontal abduction x 5. Supine alphabet x 1, supine serratus punch x 15 Reviewed standing theraband;Scapular retraction, shoulder extension, bilateral ER with yellow x 10, standing shoulder elevation x10. Also right SB stretch with left arm overhead  standing or right SL,and left pectoral stretch on wall. Pt was given illustrated instructions    04/23/2022 Left trunk observed for swelling but none noted today. Pt was tender in left lats and feels very muscular.  Assessed shoulder ROM with very good results bilaterally. Soft tissue mobilization to left UT, pectorals and lats in supine and  UT and scapular area in right S/L.Marland Kitchen Instructed pt in standing theraband;Scapular retraction, shoulder extension, bilateral ER with yellow x 10, standing shoulder elevation x10. Also right SB stretch with left arm overhead  standing or right SL,and left pectoral  stretch on wall. Pt was given illustrated instructions    04/16/2022 Soft tissue mobilization to left UT, pectorals and lats in supine and UT and scapular area in right S/L.Marland Kitchen   PROM to left shoulder flexion, abduction, and D2, ER MLD briefly: bilateral supraclavicular, 5 diaphragmatic breaths, left inguinal LN's and Left axillo-inguinal pathway and posterior interaxillary pathwaydirecting fluid away from lateral trunk repeating pathway and ending with inguinal LN's. LTR x 5 to the right,Supine scapular series with yellow band x 5 except sword.   03/26/22 Soft tissue mobilization to left UT, pectorals and lats in supine and UT and scapular area in right S/L.Lt scapular mobilization with sig tightness and decreased movement here limiting abduction in sidelying.   PROM to left shoulder flexion, abduction, and D2. Multiple VC's to pt to relax her arm, and significant guarding with twitching noted in lat and pectoralis MLD briefly: bilateral supraclavicular, 5 diaphragmatic  breaths, left inguinal LN's and Left axillo-inguinal pathway directing fluid away from lateral trunk repeating pathway and ending with inguinal LN's.  TE:  LTR x 5 with goal post arms  Supine dowel flexion x 8  Supine protraction/retraction x 5 with sig vcs and tcs needed  Practiced scapular clock in seated which pt had difficulty isolating             Surgery type/Date: 01/07/2022 Number of lymph nodes removed: 2 on left Current/past treatment (chemo, radiation, hormone therapy): Radiation to chest for Non-hodgkins lymphoma in 1985 Other symptoms:  Heaviness/tightness Yes Pain Yes Pitting edema No Infections No Decreased scar mobility Yes Stemmer sign No     PATIENT EDUCATION:   Access Code: [facility] URL: [email or web address] Date: 05/07/2022 Prepared by: Cheral Almas  Exercises - Supine Shoulder Alphabet  - 1 x daily - 7 x weekly - 1 sets - 1 reps - Supine Single Arm Scapular Protraction  - 1 x  daily - 7 x weekly - 1-2 sets - 10 reps   Access Code: [email or web address] URL: [email or web address] Date: 04/23/2022 Prepared by: Cheral Almas  Exercises - Single Arm Doorway Pec Stretch at 60 Elevation  - 1 x daily - 7 x weekly - 1 sets - 3 reps - 15 hold Also standing scapular retraction, shoulder extension and bilateral ER x 10, shoulder elevation x 10- Chest Stretch with Shoulder Squeeze  - 1 x daily - 7 x weekly - 5 reps - 5-10 hold  Person educated: Patient  Education method: Explanation, demonstration, handout Education comprehension: verbalized, demonstrated understanding, and pt returned demonstration      HOME EXERCISE PROGRAM:  Reviewed previously given post op HEP. Performed supine clasped hands flexion and stargazer today with improved ease 4/14- issued supine dowel flexion and abduction , 03/05/2022 Lower trunk rotation with arms outstretched; 03/10/22: Self MLD to Lt lateral trunk, 03/26/22 scapular clock movements  04/16/2022 Supine scapular series except sword x 5. Gave yellow band 04/23/2022 Standing scapular retraction, shoulder extension, bilateral ER with yellow, elevation with back against wall, SB stretch with arm overhead, and single arm pec stretch. ASSESSMENT:   CLINICAL IMPRESSION: Pt continues with tightness in the left pectorals and scapular area. She did loosen up with exercises and was able to do exercises with fatigue only and occasional VC's for proper form. Updated HEP with serratus punch and supine alphabet.  Pt will benefit from skilled therapeutic intervention to improve on the following deficits: Decreased knowledge of precautions, impaired UE functional use, pain, decreased ROM, postural dysfunction.    PT treatment/interventions: ADL/Self care home management, Therapeutic exercises, Therapeutic activity, Neuromuscular re-education, Balance training, Gait training, Patient/Family education, and Joint mobilization         GOALS: Goals reviewed  with patient? Yes   LONG TERM GOALS:  (STG=LTG)   GOALS Name Target Date Goal status  1 Pt will demonstrate she has regained full shoulder ROM and function post operatively compared to baselines.  Baseline: 03/31/2022 MET for ROM except left extension  2 Patient will increase bilateral shoulder flexion to be >/= 140 degrees for increased ease reaching overhead.   03/03/2022 MET 03/03/2022  3 Patient will increase bilateral shoulder abduction to be >/= 150 degrees for increased ability to obtain radiation positioning. 03/31/2022 MET  4 Patient will improve her DASH score to be </= 10 for improved overall UE function. 03/31/2022 Ongoing 41% today 18% on 03/26/22 22% 04/23/2022  5 Pt will be able to perform sidelying abduction with proper scapular movement  to limit impingement 04/23/22 ongoing        PLAN: PT FREQUENCY/DURATION: 1x/week for 6 weeks, avoiding weeks with chemo   PLAN FOR NEXT SESSION: Add scapular movement and protraction/retraction work, cont gentle PROM, STM to Lt  axillary and periscapular area; cont ball roll up wall, modified downward dog on wall  progress strength and use of Ue's, Add reach to cabinet, ball rolls on wall       Claris Pong, PT 05/07/2022, 1:59 PM    Self manual lymph drainage: Perform this sequence at least once a day.  Only give enough pressure to your skin to make the skin move.  Hug yourself.  Do circles at your neck just above your collarbones.  Repeat this 10 times  Diaphragmatic - Supine   Inhale through nose making navel move out toward hands. Exhale through puckered lips, hands follow navel in. Repeat _5__ times. Rest _10__ seconds between repeats.  LEG: Inguinal Nodes Stimulation   With small finger side of hand against hip crease on involved side, gently perform circles at the crease. Repeat __10_ times.   Copyright  VHI. All rights reserved.  Axilla to Inguinal Nodes - Sweep   On involved side, pump _4__ times from armpit  along side of trunk to hip crease.      Finish by doing the circles  same side groin.  Cancer Rehab [phone]

## 2022-05-08 ENCOUNTER — Ambulatory Visit: Payer: BC Managed Care – PPO

## 2022-05-08 ENCOUNTER — Ambulatory Visit: Payer: BC Managed Care – PPO | Admitting: Hematology

## 2022-05-08 ENCOUNTER — Other Ambulatory Visit: Payer: BC Managed Care – PPO

## 2022-05-14 ENCOUNTER — Ambulatory Visit: Payer: BC Managed Care – PPO | Attending: Surgery

## 2022-05-14 DIAGNOSIS — C50212 Malignant neoplasm of upper-inner quadrant of left female breast: Secondary | ICD-10-CM | POA: Insufficient documentation

## 2022-05-14 DIAGNOSIS — R293 Abnormal posture: Secondary | ICD-10-CM | POA: Diagnosis present

## 2022-05-14 DIAGNOSIS — M25612 Stiffness of left shoulder, not elsewhere classified: Secondary | ICD-10-CM | POA: Insufficient documentation

## 2022-05-14 DIAGNOSIS — Z483 Aftercare following surgery for neoplasm: Secondary | ICD-10-CM | POA: Diagnosis present

## 2022-05-14 DIAGNOSIS — Z170 Estrogen receptor positive status [ER+]: Secondary | ICD-10-CM | POA: Insufficient documentation

## 2022-05-14 DIAGNOSIS — M6281 Muscle weakness (generalized): Secondary | ICD-10-CM | POA: Diagnosis present

## 2022-05-14 NOTE — Therapy (Signed)
OUTPATIENT PHYSICAL THERAPY TREATMENT NOTE   Patient Name: Erika Hodge MRN: [id number] DOB:07/10/1966, 56 y.o., female Today's Date: 05/14/2022  PCP: Bonnita Nasuti, MD REFERRING PROVIDER: Erroll Luna, MD  END OF SESSION:   PT End of Session - 05/14/22 1400     Visit Number 16    Number of Visits 20    Date for PT Re-Evaluation 06/04/22    PT Start Time 3086    PT Stop Time 1456    PT Time Calculation (min) 58 min    Activity Tolerance Patient tolerated treatment well    Behavior During Therapy [facility] for tasks assessed/performed              Past Medical History:  Diagnosis Date   Anxiety    Complication of anesthesia    HA   Non Hodgkin's lymphoma ([place])    Psoriasis    Past Surgical History:  Procedure Laterality Date   BREAST BIOPSY Left 11/20/2021   BREAST RECONSTRUCTION WITH PLACEMENT OF TISSUE EXPANDER AND ALLODERM Bilateral 01/07/2022   Procedure: BILATERAL BREAST RECONSTRUCTION WITH PLACEMENT OF TISSUE EXPANDERS AND ALLODERM;  Surgeon: Irene Limbo, MD;  Location: [place];  Service: Plastics;  Laterality: Bilateral;   NIPPLE SPARING MASTECTOMY Right 01/07/2022   Procedure: RIGHT NIPPLE SPARING MASTECTOMY;  Surgeon: Erroll Luna, MD;  Location: [place];  Service: General;  Laterality: Right;   NIPPLE SPARING MASTECTOMY WITH SENTINEL LYMPH NODE BIOPSY Left 01/07/2022   Procedure: LEFT NIPPLE SPARING MASTECTOMY WITH SENTINEL LYMPH NODE BIOPSY;  Surgeon: Erroll Luna, MD;  Location: [place];  Service: General;  Laterality: Left;   TUBAL LIGATION     Patient Active Problem List   Diagnosis Date Noted   Hypokalemia 04/08/2022   Anemia of chronic disease 04/08/2022   Restless leg syndrome 04/08/2022   Obesity (BMI 30-39.9) 04/08/2022   Bronchiectasis with acute exacerbation ([place]) 04/07/2022   Neutropenic fever ([place]) 04/07/2022   Pulmonary nodules 04/07/2022   Anxiety    Hiatal hernia with  GERD    Invasive lobular carcinoma of right breast, stage 1 ([place]) 01/24/2022   Genetic testing 12/11/2021   Family history of breast cancer 11/29/2021   Malignant neoplasm of upper-inner quadrant of left breast in female, estrogen receptor positive ([place]) 11/26/2021    REFERRING DIAG: s/p bilateral Mastectomies with left SLNB  THERAPY DIAG:  Aftercare following surgery for neoplasm  Abnormal posture  Malignant neoplasm of upper-inner quadrant of left breast in female, estrogen receptor positive ([place])  PERTINENT HISTORY: Patient was diagnosed on 09/10/2021 with 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 Two negative axillary nodes were removed on the left side. An incidental finding of right breast invasive lobular carcinoma was also found and is ER/PR positive and HER2 negative. It is weakly ER positive, PR negative and HER2 negative with a Ki67 of 20%. She has a left wrist plate from a previous surgery in 2006 and a history of non-Hodgkins Lymphoma in 1985.  PRECAUTIONS: left lymphedema risk, hx of Non-Hodgkins Lymphoma, left wrist plate from prior surgery  SUBJECTIVE:  I feel pretty good this week but it's right before an infusion so it's to be expected. I've done my exercises more this week since I've been feeling able.   Marland Kitchen PAIN:  Are you having pain? No  02/03/2022 OBSERVATIONS:  Right breast with increased edema compared to left. Left inferior breast incision appears to be slightly macerated. Non-stick pad applied under each breast to reduce fluid leakage. Left axillary incision appears to be healing well.    POSTURE:  Forward head, rounded shoulders   LYMPHEDEMA ASSESSMENT:    A/PROM Right 11/27/2021 Left 11/27/2021 Right 02/03/2022 Left  02/03/2022 Right 02/21/22 Left 02/21/22 Right 03/03/2022 Left 03/03/2022 Right 03/26/22 Left  03/26/22 Right   04/23/2022 Left 04/23/2022  Shoulder extension 38 48 39 32 61 54 51 58   60 52  Shoulder flexion 157 152 100 51 148 115 163 143  155 167 159  Shoulder abduction 165 163 60 58 169 120 179 133  165 180 179  Shoulder internal rotation 67 62 63 NT 54 55 68 64   68 66  Shoulder external rotation 77 77 84 NT 90 78 97 88   99 95                          (Blank rows = not tested)    LYMPHEDEMA ASSESSMENTS:    LANDMARK RIGHT 11/27/2021 LEFT 11/27/2021 RIGHT 02/03/2022 LEFT 02/03/2022  10 cm proximal to olecranon process 32 31.9 31.8 31.5  Olecranon process 27 27 27.7 26.4  10 cm proximal to ulnar styloid process 23.7 23 23.3 22.7  Just proximal to ulnar styloid process 16.4 16.8 16.1 16.7  Across hand at thumb web space 19 18.7 19 18.8  At base of 2nd digit 6.8 7.5 6.7 7.5  (Blank rows = not tested)   Treatment: 05/14/22: Therapeutic Exs Ball roll up wall into flexion x10 and then Lt UE abd x5 returning therapist demo then reports UE fatigue so stopped Forearms on wall with yellow theraband for scapular retraction x10 each, seated after due to fatigue, then forearm walking up wall x5 each returning therapist demo for each.  Manual Therapy Soft tissue mobilization to left, pectorals and lats in supine, then in Rt S/L with cocoa butter for STm to medial scapular border and UT where pt palpably very tight and tender to touch at UT PROM left shoulder flexion, abd, and D2 to pts available end motions which did improve by end of session and she reported feeling less tightness after session  05/07/2022 Soft tissue mobilization to left UT, pectorals and lats in supine and UT and scapular area in right S/L.Marland Kitchen PROM left shoulder flexion, abd, ER. AROM Supine flexion, scaption and horizontal abduction x 5. Supine alphabet x 1, supine serratus punch x 15 Reviewed standing theraband;Scapular retraction, shoulder extension, bilateral ER with yellow x 10, standing shoulder elevation x10. Also right SB stretch  with left arm overhead  standing or right SL,and left pectoral stretch on wall. Pt was given illustrated instructions  04/23/2022 Left trunk observed for swelling but none noted today. Pt was tender in left lats and feels very muscular.  Assessed shoulder ROM with very good results bilaterally. Soft tissue mobilization to left UT, pectorals and lats in supine and UT and scapular area in right S/L.Marland Kitchen Instructed pt in standing theraband;Scapular retraction, shoulder extension, bilateral ER with yellow x 10, standing shoulder elevation x10. Also right SB stretch with left arm overhead  standing or right SL,and left pectoral stretch on wall. Pt was given illustrated instructions    04/16/2022 Soft tissue mobilization to left UT, pectorals and lats in supine and UT and scapular area in right S/L.Marland Kitchen   PROM to left shoulder flexion, abduction,  and D2, ER MLD briefly: bilateral supraclavicular, 5 diaphragmatic breaths, left inguinal LN's and Left axillo-inguinal pathway and posterior interaxillary pathwaydirecting fluid away from lateral trunk repeating pathway and ending with inguinal LN's. LTR x 5 to the right,Supine scapular series with yellow band x 5 except sword.              Surgery type/Date: 01/07/2022 Number of lymph nodes removed: 2 on left Current/past treatment (chemo, radiation, hormone therapy): Radiation to chest for Non-hodgkins lymphoma in 1985 Other symptoms:  Heaviness/tightness Yes Pain Yes Pitting edema No Infections No Decreased scar mobility Yes Stemmer sign No     PATIENT EDUCATION:   Access Code: [facility] URL: [email or web address] Date: 05/07/2022 Prepared by: Cheral Almas  Exercises - Supine Shoulder Alphabet  - 1 x daily - 7 x weekly - 1 sets - 1 reps - Supine Single Arm Scapular Protraction  - 1 x daily - 7 x weekly - 1-2 sets - 10 reps   Access Code: [email or web address] URL: [email or web address] Date: 04/23/2022 Prepared by: Cheral Almas  Exercises - Single Arm Doorway Pec Stretch at 60 Elevation  - 1 x daily - 7 x weekly - 1 sets - 3 reps - 15 hold Also standing scapular retraction, shoulder extension and bilateral ER x 10, shoulder elevation x 10- Chest Stretch with Shoulder Squeeze  - 1 x daily - 7 x weekly - 5 reps - 5-10 hold  Person educated: Patient  Education method: Explanation, demonstration, handout Education comprehension: verbalized, demonstrated understanding, and pt returned demonstration      HOME EXERCISE PROGRAM:  Reviewed previously given post op HEP. Performed supine clasped hands flexion and stargazer today with improved ease 4/14- issued supine dowel flexion and abduction , 03/05/2022 Lower trunk rotation with arms outstretched; 03/10/22: Self MLD to Lt lateral trunk, 03/26/22 scapular clock movements  04/16/2022 Supine scapular series except sword x 5. Gave yellow band 04/23/2022 Standing scapular retraction, shoulder extension, bilateral ER with yellow, elevation with back against wall, SB stretch with arm overhead, and single arm pec stretch. ASSESSMENT:   CLINICAL IMPRESSION: Pt continues with tightness in the left pectorals and scapular area that initially limits her Lt shoulder A/ROM. However, this improved well by end of session after scapular strengthening exercises that she was challenged by and continued manual therapy as noted above.   Pt will benefit from skilled therapeutic intervention to improve on the following deficits: Decreased knowledge of precautions, impaired UE functional use, pain, decreased ROM, postural dysfunction.    PT treatment/interventions: ADL/Self care home management, Therapeutic exercises, Therapeutic activity, Neuromuscular re-education, Balance training, Gait training, Patient/Family education, and Joint mobilization         GOALS: Goals reviewed with patient? Yes   LONG TERM GOALS:  (STG=LTG)   GOALS Name Target Date Goal status  1 Pt will demonstrate she  has regained full shoulder ROM and function post operatively compared to baselines.  Baseline: 03/31/2022 MET for ROM except left extension  2 Patient will increase bilateral shoulder flexion to be >/= 140 degrees for increased ease reaching overhead.   03/03/2022 MET 03/03/2022  3 Patient will increase bilateral shoulder abduction to be >/= 150 degrees for increased ability to obtain radiation positioning. 03/31/2022 MET  4 Patient will improve her DASH score to be </= 10 for improved overall UE function. 03/31/2022 Ongoing 41% today 18% on 03/26/22 22% 04/23/2022  5 Pt will be able to perform sidelying abduction with proper scapular movement to limit impingement 04/23/22 ongoing  PLAN: PT FREQUENCY/DURATION: 1x/week for 6 weeks, avoiding weeks with chemo   PLAN FOR NEXT SESSION: Add scapular movement and protraction/retraction work, cont forearms on wall activities as pt was challenged by this and can add to HEP when able; cont gentle PROM, STM to Lt  axillary and periscapular area; cont ball roll up wall, modified downward dog on wall  progress strength and use of UE's, Add reach to cabinet       Otelia Limes, PTA 05/14/2022, 3:01 PM    Self manual lymph drainage: Perform this sequence at least once a day.  Only give enough pressure to your skin to make the skin move.  Hug yourself.  Do circles at your neck just above your collarbones.  Repeat this 10 times  Diaphragmatic - Supine   Inhale through nose making navel move out toward hands. Exhale through puckered lips, hands follow navel in. Repeat _5__ times. Rest _10__ seconds between repeats.  LEG: Inguinal Nodes Stimulation   With small finger side of hand against hip crease on involved side, gently perform circles at the crease. Repeat __10_ times.   Copyright  VHI. All rights reserved.  Axilla to Inguinal Nodes - Sweep   On involved side, pump _4__ times from armpit along side of trunk to hip crease.       Finish by doing the circles  same side groin.  Cancer Rehab [phone]

## 2022-05-16 ENCOUNTER — Inpatient Hospital Stay (HOSPITAL_BASED_OUTPATIENT_CLINIC_OR_DEPARTMENT_OTHER): Payer: BC Managed Care – PPO | Admitting: Hematology

## 2022-05-16 ENCOUNTER — Other Ambulatory Visit: Payer: Self-pay

## 2022-05-16 ENCOUNTER — Encounter: Payer: Self-pay | Admitting: *Deleted

## 2022-05-16 ENCOUNTER — Inpatient Hospital Stay: Payer: BC Managed Care – PPO | Attending: Hematology

## 2022-05-16 ENCOUNTER — Encounter: Payer: Self-pay | Admitting: Hematology

## 2022-05-16 ENCOUNTER — Inpatient Hospital Stay: Payer: BC Managed Care – PPO

## 2022-05-16 VITALS — BP 136/74 | HR 90 | Temp 98.1°F | Resp 18 | Ht 63.0 in | Wt 178.5 lb

## 2022-05-16 DIAGNOSIS — Z5111 Encounter for antineoplastic chemotherapy: Secondary | ICD-10-CM | POA: Diagnosis present

## 2022-05-16 DIAGNOSIS — C50919 Malignant neoplasm of unspecified site of unspecified female breast: Secondary | ICD-10-CM

## 2022-05-16 DIAGNOSIS — C50212 Malignant neoplasm of upper-inner quadrant of left female breast: Secondary | ICD-10-CM

## 2022-05-16 DIAGNOSIS — C50911 Malignant neoplasm of unspecified site of right female breast: Secondary | ICD-10-CM

## 2022-05-16 DIAGNOSIS — R112 Nausea with vomiting, unspecified: Secondary | ICD-10-CM | POA: Diagnosis not present

## 2022-05-16 DIAGNOSIS — Z170 Estrogen receptor positive status [ER+]: Secondary | ICD-10-CM | POA: Diagnosis not present

## 2022-05-16 DIAGNOSIS — Z171 Estrogen receptor negative status [ER-]: Secondary | ICD-10-CM | POA: Insufficient documentation

## 2022-05-16 DIAGNOSIS — Z79899 Other long term (current) drug therapy: Secondary | ICD-10-CM | POA: Insufficient documentation

## 2022-05-16 LAB — CBC WITH DIFFERENTIAL (CANCER CENTER ONLY)
Abs Immature Granulocytes: 0.04 10*3/uL (ref 0.00–0.07)
Basophils Absolute: 0.1 10*3/uL (ref 0.0–0.1)
Basophils Relative: 2 %
Eosinophils Absolute: 0.1 10*3/uL (ref 0.0–0.5)
Eosinophils Relative: 1 %
HCT: 31 % — ABNORMAL LOW (ref 36.0–46.0)
Hemoglobin: 9.7 g/dL — ABNORMAL LOW (ref 12.0–15.0)
Immature Granulocytes: 1 %
Lymphocytes Relative: 16 %
Lymphs Abs: 1.2 10*3/uL (ref 0.7–4.0)
MCH: 29.4 pg (ref 26.0–34.0)
MCHC: 31.3 g/dL (ref 30.0–36.0)
MCV: 93.9 fL (ref 80.0–100.0)
Monocytes Absolute: 1.3 10*3/uL — ABNORMAL HIGH (ref 0.1–1.0)
Monocytes Relative: 17 %
Neutro Abs: 4.8 10*3/uL (ref 1.7–7.7)
Neutrophils Relative %: 63 %
Platelet Count: 737 10*3/uL — ABNORMAL HIGH (ref 150–400)
RBC: 3.3 MIL/uL — ABNORMAL LOW (ref 3.87–5.11)
RDW: 17.2 % — ABNORMAL HIGH (ref 11.5–15.5)
WBC Count: 7.6 10*3/uL (ref 4.0–10.5)
nRBC: 0 % (ref 0.0–0.2)

## 2022-05-16 LAB — CMP (CANCER CENTER ONLY)
ALT: 10 U/L (ref 0–44)
AST: 11 U/L — ABNORMAL LOW (ref 15–41)
Albumin: 4 g/dL (ref 3.5–5.0)
Alkaline Phosphatase: 87 U/L (ref 38–126)
Anion gap: 8 (ref 5–15)
BUN: 14 mg/dL (ref 6–20)
CO2: 26 mmol/L (ref 22–32)
Calcium: 10 mg/dL (ref 8.9–10.3)
Chloride: 104 mmol/L (ref 98–111)
Creatinine: 0.69 mg/dL (ref 0.44–1.00)
GFR, Estimated: >60 mL/min (ref >60)
Glucose, Bld: 105 mg/dL — ABNORMAL HIGH (ref 70–99)
Potassium: 3.8 mmol/L (ref 3.5–5.1)
Sodium: 138 mmol/L (ref 135–145)
Total Bilirubin: 0.2 mg/dL — ABNORMAL LOW (ref 0.3–1.2)
Total Protein: 7.6 g/dL (ref 6.5–8.1)

## 2022-05-16 MED ORDER — SODIUM CHLORIDE 0.9 % IV SOLN
10.0000 mg | Freq: Once | INTRAVENOUS | Status: AC
  Administered 2022-05-16: 10 mg via INTRAVENOUS
  Filled 2022-05-16: qty 10

## 2022-05-16 MED ORDER — SODIUM CHLORIDE 0.9 % IV SOLN: Freq: Once | INTRAVENOUS | Status: AC

## 2022-05-16 MED ORDER — HEPARIN SOD (PORK) LOCK FLUSH 100 UNIT/ML IV SOLN: 500.0000 [IU] | Freq: Once | INTRAVENOUS | Status: DC | PRN

## 2022-05-16 MED ORDER — SODIUM CHLORIDE 0.9 % IV SOLN
65.0000 mg/m2 | Freq: Once | INTRAVENOUS | Status: AC
  Administered 2022-05-16: 120 mg via INTRAVENOUS
  Filled 2022-05-16: qty 12

## 2022-05-16 MED ORDER — SODIUM CHLORIDE 0.9 % IV SOLN
600.0000 mg/m2 | Freq: Once | INTRAVENOUS | Status: AC
  Administered 2022-05-16: 1140 mg via INTRAVENOUS
  Filled 2022-05-16: qty 57

## 2022-05-16 MED ORDER — SODIUM CHLORIDE 0.9% FLUSH: 10.0000 mL | INTRAVENOUS | Status: DC | PRN

## 2022-05-16 MED ORDER — PALONOSETRON HCL INJECTION 0.25 MG/5ML
0.2500 mg | Freq: Once | INTRAVENOUS | Status: AC
  Administered 2022-05-16: 0.25 mg via INTRAVENOUS
  Filled 2022-05-16: qty 5

## 2022-05-16 NOTE — Patient Instructions (Signed)
[facility] MEDICAL [facility]COLOGY  Discharge Instructions: Thank you for choosing [facility] to provide your oncology and hematology care.   If you have a lab appointment with the Cancer Center, please go directly to the Cancer Center and check in at the registration area.   Wear comfortable clothing and clothing appropriate for easy access to any Portacath or PICC line.   We strive to give you quality time with your provider. You may need to reschedule your appointment if you arrive late (15 or more minutes).  Arriving late affects you and other patients whose appointments are after yours.  Also, if you miss three or more appointments without notifying the office, you may be dismissed from the clinic at the provider's discretion.      For prescription refill requests, have your pharmacy contact our office and allow 72 hours for refills to be completed.    Today you received the following chemotherapy and/or immunotherapy agents : Taxotere, Cytoxan     To help prevent nausea and vomiting after your treatment, we encourage you to take your nausea medication as directed.  BELOW ARE SYMPTOMS THAT SHOULD BE REPORTED IMMEDIATELY: *FEVER GREATER THAN 100.4 F (38 C) OR HIGHER *CHILLS OR SWEATING *NAUSEA AND VOMITING THAT IS NOT CONTROLLED WITH YOUR NAUSEA MEDICATION *UNUSUAL SHORTNESS OF BREATH *UNUSUAL BRUISING OR BLEEDING *URINARY PROBLEMS (pain or burning when urinating, or frequent urination) *BOWEL PROBLEMS (unusual diarrhea, constipation, pain near the anus) TENDERNESS IN MOUTH AND THROAT WITH OR WITHOUT PRESENCE OF ULCERS (sore throat, sores in mouth, or a toothache) UNUSUAL RASH, SWELLING OR PAIN  UNUSUAL VAGINAL DISCHARGE OR ITCHING   Items with * indicate a potential emergency and should be followed up as soon as possible or go to the Emergency Department if any problems should occur.  Please show the CHEMOTHERAPY ALERT CARD or IMMUNOTHERAPY ALERT CARD at  check-in to the Emergency Department and triage nurse.  Should you have questions after your visit or need to cancel or reschedule your appointment, please contact [facility]  Dept: [phone]  and follow the prompts.  Office hours are 8:00 a.m. to 4:30 p.m. Monday - Friday. Please note that voicemails left after 4:00 p.m. may not be returned until the following business day.  We are closed weekends and major holidays. You have access to a nurse at all times for urgent questions. Please call the main number to the clinic Dept: [phone] and follow the prompts.   For any non-urgent questions, you may also contact your provider using MyChart. We now offer e-Visits for anyone 18 and older to request care online for non-urgent symptoms. For details visit [email or web address].   Also download the MyChart app! Go to the app store, search "MyChart", open the app, select [facility], and log in with your MyChart username and password.  Due to Covid, a mask is required upon entering the hospital/clinic. If you do not have a mask, one will be given to you upon arrival. For doctor visits, patients may have 1 support Cosandra Plouffe aged 55 or older with them. For treatment visits, patients cannot have anyone with them due to current Covid guidelines and our immunocompromised population.   

## 2022-05-16 NOTE — Progress Notes (Signed)
[place]   [phone] [phone]   Clinic Follow up Note   Patient Care Team: Bonnita Nasuti, MD as PCP - General (Internal Medicine) Erroll Luna, MD as Consulting Physician (General Surgery) Truitt Merle, MD as Consulting Physician (Hematology) Gery Pray, MD as Consulting Physician (Radiation Oncology) Mauro Kaufmann, RN as Oncology Nurse Navigator Rockwell Germany, RN as Oncology Nurse Navigator  Date of Service:  05/16/2022  CHIEF COMPLAINT: f/u of bilateral breast cancer  CURRENT THERAPY:  Adjuvant TC, q21d, starting 02/12/22  ASSESSMENT & PLAN:  Erika Hodge is a 56 y.o. post-menopausal female with   1. 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 -she opted to proceed with b/l mastectomies with reconstruction on 01/07/22 with Dr. Brantley Stage and Dr. Iran Planas. Pathology revealed b/l breast cancers. Left breast showed 2.1 cm invasive and in situ ductal carcinoma. Margins and lymph nodes negative (0/2). Repeated ER/PR/HER2 were all negative (ER 10% weak (+) on biopsy) -she developed left IMF necrotic tissue, excised by Dr. Iran Planas on 01/27/22. -because she was likely treated with adriamycin for her lymphoma, she is not a candidate for more of this medicine. Therefore, I recommend 6 cycles of TC (docetaxel and cytoxan). She declined port placement.  -she began TC on 02/12/22. She tolerates fairly overall with moderate fatigue and shortness of breath. She also experienced intense bone pain after GCF-S injection and it was held for cycle 3. She was hospitalized after C3 with neutropenia and sepsis.  I reviewed her hospital course.  She has recovered well now  -labs reviewed, hgb 9.7, plt 737k. Plan to continue GCF-S despite her side effects to prevent another hospital admission; she agrees.    2.  Invasive lobular carcinoma of right breast, stage I pT1c, ER weakly+/PR+/Her2-, and DCIS  -incidental finding on b/l mastectomies on 01/07/22 for left breast cancer. Pathology from right breast showed: 1.1 cm invasive lobular carcinoma, grade 1, in LIQ; 1 cm DCIS, intermediate grade, in UIQ. ILC showed ER 30% weakly positive, PR 80% moderately positive, Her2 negative. ILC focally involves adjacent anterior margin.  -we will discuss antiestrogen therapy after she finishes chemo.  She reluctant to try.   3. 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 -per pt, she also received three years of systemic treatment. She reports she was treated at [facility]. Her records were too old to be received, but she likely received adriamycin during that time.   4. Psoriasis  -previously on Tremfya injections, was told by dermatology that she could not restart due to h/o cancer. -I do not recommend immunotherapy because of this. -followed by Dr. Abner Greenspan with [facility], per pt     PLAN:  -proceed with C5 TC today at same dose  -Udenyca on day 3, she will try higher dose oxycodone for injection related pain  -lab, f/u, and final TC in 3 weeks (last cycle)    No problem-specific Assessment & Plan notes found for this encounter.   SUMMARY OF ONCOLOGIC HISTORY: Oncology History Overview Note   Cancer Staging  Invasive lobular carcinoma of right breast, stage 1 ([place]) Staging form: Breast, AJCC 8th Edition - Clinical stage from 01/07/2022: Stage Unknown (cT1c, cNX, cM0, G1, ER+, PR+, HER2-) - Signed by Truitt Merle, MD on 01/24/2022  Malignant neoplasm of upper-inner quadrant  of left breast in female, estrogen receptor positive ([place]) Staging form: Breast, AJCC 8th Edition - Clinical stage from 11/20/2021: Stage IA (cT1c, cN0, cM0, G2, ER+, PR-, HER2-) - Signed by Truitt Merle, MD on 11/26/2021 - Pathologic stage from 01/07/2022: Stage IIA (pT2, pN0, cM0, G2,  ER-, PR-, HER2-) - Signed by Truitt Merle, MD on 01/24/2022     Malignant neoplasm of upper-inner quadrant of left breast in female, estrogen receptor positive ([place])  11/05/2021 Mammogram   CLINICAL DATA:  Screening recall for a possible left breast asymmetry.   EXAM: DIGITAL DIAGNOSTIC UNILATERAL LEFT MAMMOGRAM WITH TOMOSYNTHESIS AND CAD; ULTRASOUND LEFT BREAST LIMITED  IMPRESSION: 1. Irregular 1.2 cm mass in the left breast at 11 o'clock, 2 cm the nipple, suspicious for malignancy.   11/20/2021 Cancer Staging   Staging form: Breast, AJCC 8th Edition - Clinical stage from 11/20/2021: Stage IA (cT1c, cN0, cM0, G2, ER+, PR-, HER2-) - Signed by Truitt Merle, MD on 11/26/2021 Stage prefix: Initial diagnosis Histologic grading system: 3 grade system   11/20/2021 Initial Biopsy   Diagnosis Breast, left, needle core biopsy, 11 o'clock, ribbon clip - INVASIVE DUCTAL CARCINOMA - DUCTAL CARCINOMA IN SITU - SEE COMMENT Microscopic Comment based on the biopsy, the carcinoma appears Nottingham grade 2 of 3 and measures 0.6 cm in greatest linear extent.  PROGNOSTIC INDICATORS Results: The tumor cells are NEGATIVE for Her2 (1+). Estrogen Receptor: 10%, POSITIVE, WEAK STAINING INTENSITY Progesterone Receptor: 0%, NEGATIVE Proliferation Marker Ki67: 20%   11/26/2021 Initial Diagnosis   Malignant neoplasm of upper-inner quadrant of left breast in female, estrogen receptor positive ([place])   11/27/2021 Genetic Testing   [employer] Cancer[facility] was Negative. Report date is 12/11/2021.  The CancerNext gene panel offered by [employer] includes sequencing, rearrangement analysis, and RNA analysis for the following 36 genes:   APC, ATM, AXIN2, BARD1, BMPR1A, BRCA1, BRCA2, BRIP1, CDH1, CDK4, CDKN2A, CHEK2, DICER1, HOXB13, EPCAM, GREM1, MLH1, MSH2, MSH3, MSH6, MUTYH, NBN, NF1, NTHL1, PALB2, PMS2, POLD1, POLE, PTEN, RAD51C, RAD51D, RECQL, SMAD4, SMARCA4, STK11, and TP53.    01/07/2022 Cancer Staging   Staging form:  Breast, AJCC 8th Edition - Pathologic stage from 01/07/2022: Stage IIA (pT2, pN0, cM0, G2, ER-, PR-, HER2-) - Signed by Truitt Merle, MD on 01/24/2022 Stage prefix: Initial diagnosis Histologic grading system: 3 grade system Residual tumor (R): R0 - None   01/07/2022 Definitive Surgery   FINAL MICROSCOPIC DIAGNOSIS:   A. BREAST, RIGHT NIPPLE, BIOPSY:  - Benign breast tissue.  - No malignancy identified.   B. BREAST, LEFT NIPPLE, BIOPSY:  - Benign breast tissue.  - No malignancy identified.   C. BREAST, RIGHT, MASTECTOMY:  - Invasive lobular carcinoma, 1.1 cm.  - Ductal carcinoma in situ, 1 cm.  - Invasive carcinoma focally extends to the anterior margin.  - Fibrocystic changes with usual ductal hyperplasia and calcifications.  - Sclerosing adenosis and fibroadenomatoid change.  - See oncology table and comment.   D. LYMPH NODE, LEFT AXILLARY, SENTINEL, EXCISION:  - One lymph node negative for metastatic carcinoma (0/1).   E. LYMPH NODE, LEFT AXILLARY, SENTINEL, EXCISION:  - One lymph node negative for metastatic carcinoma (0/1).   F. BREAST, LEFT, MASTECTOMY:  - Invasive and in situ ductal carcinoma, 2.1 cm.  - Margins negative for carcinoma.  - See oncology table.    01/07/2022 Receptors her2   F1. PROGNOSTIC INDICATOR RESULTS:   The tumor cells are NEGATIVE for Her2 (0).   Estrogen Receptor: NEGATIVE  Progesterone Receptor: NEGATIVE  Proliferation  Marker Ki-67: 10%    02/12/2022 -  Chemotherapy   Patient is on Treatment Plan : BREAST TC q21d     Invasive lobular carcinoma of right breast, stage 1 ([place])  11/27/2021 Genetic Testing   [employer] [facility] was Negative. Report date is 12/11/2021.  The CancerNext gene panel offered by [employer] includes sequencing, rearrangement analysis, and RNA analysis for the following 36 genes:   APC, ATM, AXIN2, BARD1, BMPR1A, BRCA1, BRCA2, BRIP1, CDH1, CDK4, CDKN2A, CHEK2, DICER1, HOXB13, EPCAM, GREM1, MLH1, MSH2, MSH3, MSH6, MUTYH, NBN,  NF1, NTHL1, PALB2, PMS2, POLD1, POLE, PTEN, RAD51C, RAD51D, RECQL, SMAD4, SMARCA4, STK11, and TP53.    01/07/2022 Cancer Staging   Staging form: Breast, AJCC 8th Edition - Clinical stage from 01/07/2022: Stage Unknown (cT1c, cNX, cM0, G1, ER+, PR+, HER2-) - Signed by Truitt Merle, MD on 01/24/2022 Stage prefix: Initial diagnosis Histologic grading system: 3 grade system   01/07/2022 Definitive Surgery   FINAL MICROSCOPIC DIAGNOSIS:   A. BREAST, RIGHT NIPPLE, BIOPSY:  - Benign breast tissue.  - No malignancy identified.   B. BREAST, LEFT NIPPLE, BIOPSY:  - Benign breast tissue.  - No malignancy identified.   C. BREAST, RIGHT, MASTECTOMY:  - Invasive lobular carcinoma, 1.1 cm.  - Ductal carcinoma in situ, 1 cm.  - Invasive carcinoma focally extends to the anterior margin.  - Fibrocystic changes with usual ductal hyperplasia and calcifications.  - Sclerosing adenosis and fibroadenomatoid change.  - See oncology table and comment.   D. LYMPH NODE, LEFT AXILLARY, SENTINEL, EXCISION:  - One lymph node negative for metastatic carcinoma (0/1).   E. LYMPH NODE, LEFT AXILLARY, SENTINEL, EXCISION:  - One lymph node negative for metastatic carcinoma (0/1).   F. BREAST, LEFT, MASTECTOMY:  - Invasive and in situ ductal carcinoma, 2.1 cm.  - Margins negative for carcinoma.  - See oncology table.    01/07/2022 Receptors her2   C7.  PROGNOSTIC INDICATOR RESULTS:   The tumor cells are NEGATIVE for Her2 (1+).   Estrogen Receptor: POSITIVE, 30% WEAK STAINING INTENSITY  Progesterone Receptor: POSITIVE, 80% MODERATE STAINING INTENSITY  Proliferation Marker Ki-67: <5%    01/24/2022 Initial Diagnosis   Invasive lobular carcinoma of right breast, stage 1 ([place])      INTERVAL HISTORY:  CARALINE DEUTSCHMAN is here for a follow up of breast cancer. She was last seen by me on 04/25/22. She presents to the clinic accompanied by her husband. She tolerated last cycle poorly, with nausea and vomiting. They  also report she again had awful side effects from GCF-S injection.   All other systems were reviewed with the patient and are negative.  MEDICAL HISTORY:  Past Medical History:  Diagnosis Date   Anxiety    Complication of anesthesia    HA   Non Hodgkin's lymphoma ([place])    Psoriasis     SURGICAL HISTORY: Past Surgical History:  Procedure Laterality Date   BREAST BIOPSY Left 11/20/2021   BREAST RECONSTRUCTION WITH PLACEMENT OF TISSUE EXPANDER AND ALLODERM Bilateral 01/07/2022   Procedure: BILATERAL BREAST RECONSTRUCTION WITH PLACEMENT OF TISSUE EXPANDERS AND ALLODERM;  Surgeon: Irene Limbo, MD;  Location: [facility];  Service: Plastics;  Laterality: Bilateral;   NIPPLE SPARING MASTECTOMY Right 01/07/2022   Procedure: RIGHT NIPPLE SPARING MASTECTOMY;  Surgeon: Erroll Luna, MD;  Location: [facility];  Service: General;  Laterality: Right;   NIPPLE SPARING MASTECTOMY WITH SENTINEL LYMPH NODE BIOPSY Left 01/07/2022   Procedure: LEFT NIPPLE SPARING MASTECTOMY WITH SENTINEL  LYMPH NODE BIOPSY;  Surgeon: Erroll Luna, MD;  Location: [facility];  Service: General;  Laterality: Left;   TUBAL LIGATION      I have reviewed the social history and family history with the patient and they are unchanged from previous note.  ALLERGIES:  is allergic to amoxicillin, ibuprofen, ciprofloxacin, and codeine.  MEDICATIONS:  Current Outpatient Medications  Medication Sig Dispense Refill   acetaminophen (TYLENOL) 500 MG tablet Take 1,000 mg by mouth every 6 (six) hours as needed for mild pain or fever.     albuterol (PROVENTIL) (2.5 MG/3ML) 0.083% nebulizer solution Take 3 mLs (2.5 mg total) by nebulization every 6 (six) hours as needed for wheezing or shortness of breath. 75 mL 6   albuterol (VENTOLIN HFA) 108 (90 Base) MCG/ACT inhaler Inhale 2 puffs into the lungs every 6 (six) hours as needed for wheezing or shortness of breath. (Patient not taking:  Reported on 04/23/2022) 8 g 2   ALPRAZolam (XANAX) 0.5 MG tablet Take 0.5 mg by mouth daily as needed for anxiety or sleep.     ASHWAGANDHA PO Take 1 tablet by mouth daily.     Calcipotriene-Betameth Diprop (ENSTILAR) 0.005-0.064 % FOAM Apply 1 application topically as needed (apply to psoriasis area as needed).     dexamethasone (DECADRON) 4 MG tablet Take 1 tablet (4 mg total) by mouth 2 (two) times daily. Start the day before Taxotere. Then change to one tablet daily after chemo for 3 days. 30 tablet 1   FLUoxetine (PROZAC) 20 MG capsule Take 20 mg by mouth daily.     fluticasone (FLONASE) 50 MCG/ACT nasal spray Place 2 sprays into both nostrils daily. 15.8 mL 2   loratadine (CLARITIN) 10 MG tablet Take 10 mg by mouth daily.     omeprazole (PRILOSEC) 40 MG capsule Take 40 mg by mouth daily.     ondansetron (ZOFRAN) 8 MG tablet Take 1 tablet (8 mg total) by mouth 2 (two) times daily as needed for refractory nausea / vomiting. Start on day 3 after chemo. 30 tablet 1   oxyCODONE (OXY IR/ROXICODONE) 5 MG immediate release tablet Take 0.5-1 tablets (2.5-5 mg total) by mouth every 8 (eight) hours as needed for severe pain. 20 tablet 0   prochlorperazine (COMPAZINE) 10 MG tablet Take 1 tablet (10 mg total) by mouth every 6 (six) hours as needed (Nausea or vomiting). 30 tablet 1   rOPINIRole (REQUIP) 0.5 MG tablet Take 1 tablet (0.5 mg total) by mouth at bedtime. 30 tablet 0   senna-docusate (SENOKOT-S) 8.6-50 MG tablet Take 1 tablet by mouth at bedtime as needed for mild constipation. 30 tablet 0   zolpidem (AMBIEN) 10 MG tablet Take 10 mg by mouth at bedtime as needed for sleep.     No current facility-administered medications for this visit.   Facility-Administered Medications Ordered in Other Visits  Medication Dose Route Frequency Provider Last Rate Last Admin   cyclophosphamide (CYTOXAN) 1,140 mg in sodium chloride 0.9 % 250 mL chemo infusion  600 mg/m2 (Treatment Plan Recorded) Intravenous Once  Truitt Merle, MD       DOCEtaxel (TAXOTERE) 120 mg in sodium chloride 0.9 % 250 mL chemo infusion  65 mg/m2 (Treatment Plan Recorded) Intravenous Once Truitt Merle, MD 262 mL/hr at 05/16/22 1538 120 mg at 05/16/22 1538   heparin lock flush 100 unit/mL  500 Units Intracatheter Once PRN Truitt Merle, MD       sodium chloride flush (NS) 0.9 % injection 10 mL  10 mL Intracatheter PRN Truitt Merle, MD        PHYSICAL EXAMINATION: ECOG PERFORMANCE STATUS: 1 - Symptomatic but completely ambulatory  Vitals:   05/16/22 1334  BP: 136/74  Pulse: 90  Resp: 18  Temp: 98.1 F (36.7 C)  SpO2: 99%   Wt Readings from Last 3 Encounters:  05/16/22 178 lb 8 oz (81 kg)  04/25/22 180 lb 8 oz (81.9 kg)  04/23/22 180 lb (81.6 kg)     GENERAL:alert, no distress and comfortable SKIN: skin color normal, no rashes or significant lesions EYES: normal, Conjunctiva are pink and non-injected, sclera clear  NEURO: alert & oriented x 3 with fluent speech  LABORATORY DATA:  I have reviewed the data as listed    Latest Ref Rng & Units 05/16/2022    1:13 PM 04/25/2022   12:14 PM 04/18/2022    9:41 AM  CBC  WBC 4.0 - 10.5 K/uL 7.6  9.7  15.2   Hemoglobin 12.0 - 15.0 g/dL 9.7  11.0  10.6   Hematocrit 36.0 - 46.0 % 31.0  34.4  33.4   Platelets 150 - 400 K/uL 737  467  447         Latest Ref Rng & Units 05/16/2022    1:13 PM 04/25/2022   12:14 PM 04/18/2022    9:41 AM  CMP  Glucose 70 - 99 mg/dL 105  137  105   BUN 6 - 20 mg/dL _0 Creatinine 0.44 - 1.00 mg/dL 0.69  0.73  0.80   Sodium 135 - 145 mmol/L 138  136  137   Potassium 3.5 - 5.1 mmol/L 3.8  3.7  4.1   Chloride 98 - 111 mmol/L 104  104  104   CO2 22 - 32 mmol/L _1 Calcium 8.9 - 10.3 mg/dL 10.0  9.9  9.9   Total Protein 6.5 - 8.1 g/dL 7.6  7.4  7.3   Total Bilirubin 0.3 - 1.2 mg/dL 0.2  0.3  0.3   Alkaline Phos 38 - 126 U/L 87  67  63   AST 15 - 41 U/L _2 ALT 0 - 44 U/L _3 RADIOGRAPHIC STUDIES: I have personally  reviewed the radiological images as listed and agreed with the findings in the report. No results found.    No orders of the defined types were placed in this encounter.  All questions were answered. The patient knows to call the clinic with any problems, questions or concerns. No barriers to learning was detected. The total time spent in the appointment was 30 minutes.     Truitt Merle, MD 05/16/2022   I, Wilburn Mylar, am acting as scribe for Truitt Merle, MD.   I have reviewed the above documentation for accuracy and completeness, and I agree with the above.

## 2022-05-17 ENCOUNTER — Other Ambulatory Visit: Payer: Self-pay | Admitting: Hematology

## 2022-05-17 DIAGNOSIS — D649 Anemia, unspecified: Secondary | ICD-10-CM

## 2022-05-19 ENCOUNTER — Telehealth: Payer: Self-pay | Admitting: Hematology

## 2022-05-19 ENCOUNTER — Inpatient Hospital Stay: Payer: BC Managed Care – PPO

## 2022-05-19 ENCOUNTER — Other Ambulatory Visit: Payer: Self-pay

## 2022-05-19 VITALS — BP 138/74 | HR 101 | Temp 98.3°F | Resp 18

## 2022-05-19 DIAGNOSIS — Z5111 Encounter for antineoplastic chemotherapy: Secondary | ICD-10-CM | POA: Diagnosis not present

## 2022-05-19 DIAGNOSIS — Z170 Estrogen receptor positive status [ER+]: Secondary | ICD-10-CM

## 2022-05-19 MED ORDER — PEGFILGRASTIM-CBQV 6 MG/0.6ML ~~LOC~~ SOSY
6.0000 mg | PREFILLED_SYRINGE | Freq: Once | SUBCUTANEOUS | Status: AC
  Administered 2022-05-19: 6 mg via SUBCUTANEOUS
  Filled 2022-05-19: qty 0.6

## 2022-05-19 NOTE — Telephone Encounter (Signed)
Scheduled follow-up appointments per 7/7 los. Patient is aware.

## 2022-05-21 ENCOUNTER — Encounter: Payer: BC Managed Care – PPO | Admitting: Rehabilitation

## 2022-05-26 ENCOUNTER — Encounter: Payer: Self-pay | Admitting: *Deleted

## 2022-05-28 ENCOUNTER — Encounter: Payer: Self-pay | Admitting: Physical Therapy

## 2022-05-28 ENCOUNTER — Ambulatory Visit: Payer: BC Managed Care – PPO | Admitting: Physical Therapy

## 2022-05-28 DIAGNOSIS — Z483 Aftercare following surgery for neoplasm: Secondary | ICD-10-CM

## 2022-05-28 DIAGNOSIS — R293 Abnormal posture: Secondary | ICD-10-CM

## 2022-05-28 DIAGNOSIS — C50212 Malignant neoplasm of upper-inner quadrant of left female breast: Secondary | ICD-10-CM

## 2022-05-28 NOTE — Therapy (Signed)
OUTPATIENT PHYSICAL THERAPY TREATMENT NOTE   Patient Name: Erika Hodge MRN: [id number] DOB:1966-10-29, 56 y.o., female Today's Date: 05/28/2022  PCP: Bonnita Nasuti, MD REFERRING PROVIDER: Bonnita Nasuti, MD  END OF SESSION:   PT End of Session - 05/28/22 1303     Visit Number 17    Number of Visits 20    Date for PT Re-Evaluation 06/04/22    PT Start Time 1302    PT Stop Time 1354    PT Time Calculation (min) 52 min    Activity Tolerance Patient tolerated treatment well    Behavior During Therapy [facility] for tasks assessed/performed              Past Medical History:  Diagnosis Date   Anxiety    Complication of anesthesia    HA   Non Hodgkin's lymphoma ([place])    Psoriasis    Past Surgical History:  Procedure Laterality Date   BREAST BIOPSY Left 11/20/2021   BREAST RECONSTRUCTION WITH PLACEMENT OF TISSUE EXPANDER AND ALLODERM Bilateral 01/07/2022   Procedure: BILATERAL BREAST RECONSTRUCTION WITH PLACEMENT OF TISSUE EXPANDERS AND ALLODERM;  Surgeon: Irene Limbo, MD;  Location: [place];  Service: Plastics;  Laterality: Bilateral;   NIPPLE SPARING MASTECTOMY Right 01/07/2022   Procedure: RIGHT NIPPLE SPARING MASTECTOMY;  Surgeon: Erroll Luna, MD;  Location: [place];  Service: General;  Laterality: Right;   NIPPLE SPARING MASTECTOMY WITH SENTINEL LYMPH NODE BIOPSY Left 01/07/2022   Procedure: LEFT NIPPLE SPARING MASTECTOMY WITH SENTINEL LYMPH NODE BIOPSY;  Surgeon: Erroll Luna, MD;  Location: [place];  Service: General;  Laterality: Left;   TUBAL LIGATION     Patient Active Problem List   Diagnosis Date Noted   Hypokalemia 04/08/2022   Anemia of chronic disease 04/08/2022   Restless leg syndrome 04/08/2022   Obesity (BMI 30-39.9) 04/08/2022   Bronchiectasis with acute exacerbation ([place]) 04/07/2022   Neutropenic fever ([place]) 04/07/2022   Pulmonary nodules 04/07/2022   Anxiety    Hiatal hernia with  GERD    Invasive lobular carcinoma of right breast, stage 1 ([place]) 01/24/2022   Genetic testing 12/11/2021   Family history of breast cancer 11/29/2021   Malignant neoplasm of upper-inner quadrant of left breast in female, estrogen receptor positive ([place]) 11/26/2021    REFERRING DIAG: s/p bilateral Mastectomies with left SLNB  THERAPY DIAG:  Aftercare following surgery for neoplasm  Abnormal posture  Malignant neoplasm of upper-inner quadrant of left breast in female, estrogen receptor positive ([place])  PERTINENT HISTORY: Patient was diagnosed on 09/10/2021 with 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 Two negative axillary nodes were removed on the left side. An incidental finding of right breast invasive lobular carcinoma was also found and is ER/PR positive and HER2 negative. It is weakly ER positive, PR negative and HER2 negative with a Ki67 of 20%. She has a left wrist plate from a previous surgery in 2006 and a history of non-Hodgkins Lymphoma in 1985.  PRECAUTIONS: left lymphedema risk, hx of Non-Hodgkins Lymphoma, left wrist plate from prior surgery  SUBJECTIVE:  I feel pretty good this week but it's right before an infusion so it's to be expected. I've done my exercises more this week since I've been feeling able.   Marland Kitchen PAIN:  Are you having pain? Yes: NPRS scale: 1/10 Pain location: whole body achiness Pain description: achy Aggravating factors:  exercising Relieving factors: rest  02/03/2022 OBSERVATIONS:            Right breast with increased edema compared to left. Left inferior breast incision appears to be slightly macerated. Non-stick pad applied under each breast to reduce fluid leakage. Left axillary incision appears to be healing well.    POSTURE:  Forward head, rounded shoulders   LYMPHEDEMA ASSESSMENT:    A/PROM Right 11/27/2021  Left 11/27/2021 Right 02/03/2022 Left  02/03/2022 Right 02/21/22 Left 02/21/22 Right 03/03/2022 Left 03/03/2022 Right 03/26/22 Left  03/26/22 Right  04/23/2022 Left 04/23/2022  Shoulder extension 38 48 39 32 61 54 51 58   60 52  Shoulder flexion 157 152 100 51 148 115 163 143  155 167 159  Shoulder abduction 165 163 60 58 169 120 179 133  165 180 179  Shoulder internal rotation 67 62 63 NT 54 55 68 64   68 66  Shoulder external rotation 77 77 84 NT 90 78 97 88   99 95                          (Blank rows = not tested)    LYMPHEDEMA ASSESSMENTS:    LANDMARK RIGHT 11/27/2021 LEFT 11/27/2021 RIGHT 02/03/2022 LEFT 02/03/2022  10 cm proximal to olecranon process 32 31.9 31.8 31.5  Olecranon process 27 27 27.7 26.4  10 cm proximal to ulnar styloid process 23.7 23 23.3 22.7  Just proximal to ulnar styloid process 16.4 16.8 16.1 16.7  Across hand at thumb web space 19 18.7 19 18.8  At base of 2nd digit 6.8 7.5 6.7 7.5  (Blank rows = not tested)   Treatment:  05/28/22: Therapeutic Exs Ball roll up wall into flexion x10 and then Lt UE abd x10 returning therapist demo with pt feeling fatigue by the end of this Forearms on wall with yellow theraband for scapular retraction x10 each, then forearm walking up wall x5 with pt reporting increased fatigue with this Manual Therapy Soft tissue mobilization to left, pectorals and lats in supine, then in Rt S/L with cocoa butter for STM to medial scapular border and UT where pt palpably very tight PROM left shoulder flexion, abd, and D2 to pts available end motions which did improve by end of session, pt required frequent cues to relax 05/14/22: Therapeutic Exs Ball roll up wall into flexion x10 and then Lt UE abd x5 returning therapist demo then reports UE fatigue so stopped Forearms on wall with yellow theraband for scapular retraction x10 each, seated after due to fatigue, then forearm walking up wall x5 each returning therapist demo for each.  Manual  Therapy Soft tissue mobilization to left, pectorals and lats in supine, then in Rt S/L with cocoa butter for STm to medial scapular border and UT where pt palpably very tight and tender to touch at UT PROM left shoulder flexion, abd, and D2 to pts available end motions which did improve by end of session and she reported feeling less tightness after session  05/07/2022 Soft tissue mobilization to left UT, pectorals and lats in supine and UT and scapular area in right S/L.Marland Kitchen PROM left shoulder flexion, abd, ER. AROM Supine flexion, scaption and horizontal abduction x 5. Supine alphabet x 1, supine serratus punch x 15 Reviewed standing theraband;Scapular retraction, shoulder extension, bilateral ER with yellow x 10, standing shoulder elevation x10. Also right SB stretch with left arm overhead  standing or right SL,and left pectoral stretch  on wall. Pt was given illustrated instructions  04/23/2022 Left trunk observed for swelling but none noted today. Pt was tender in left lats and feels very muscular.  Assessed shoulder ROM with very good results bilaterally. Soft tissue mobilization to left UT, pectorals and lats in supine and UT and scapular area in right S/L.Marland Kitchen Instructed pt in standing theraband;Scapular retraction, shoulder extension, bilateral ER with yellow x 10, standing shoulder elevation x10. Also right SB stretch with left arm overhead  standing or right SL,and left pectoral stretch on wall. Pt was given illustrated instructions             Surgery type/Date: 01/07/2022 Number of lymph nodes removed: 2 on left Current/past treatment (chemo, radiation, hormone therapy): Radiation to chest for Non-hodgkins lymphoma in 1985 Other symptoms:  Heaviness/tightness Yes Pain Yes Pitting edema No Infections No Decreased scar mobility Yes Stemmer sign No     PATIENT EDUCATION:   Access Code: [facility] URL: [email or web address] Date: 05/07/2022 Prepared by: Cheral Almas  Exercises - Supine Shoulder Alphabet  - 1 x daily - 7 x weekly - 1 sets - 1 reps - Supine Single Arm Scapular Protraction  - 1 x daily - 7 x weekly - 1-2 sets - 10 reps   Access Code: [email or web address] URL: [email or web address] Date: 04/23/2022 Prepared by: Cheral Almas  Exercises - Single Arm Doorway Pec Stretch at 60 Elevation  - 1 x daily - 7 x weekly - 1 sets - 3 reps - 15 hold Also standing scapular retraction, shoulder extension and bilateral ER x 10, shoulder elevation x 10- Chest Stretch with Shoulder Squeeze  - 1 x daily - 7 x weekly - 5 reps - 5-10 hold  Person educated: Patient  Education method: Explanation, demonstration, handout Education comprehension: verbalized, demonstrated understanding, and pt returned demonstration      HOME EXERCISE PROGRAM:  Reviewed previously given post op HEP. Performed supine clasped hands flexion and stargazer today with improved ease 4/14- issued supine dowel flexion and abduction , 03/05/2022 Lower trunk rotation with arms outstretched; 03/10/22: Self MLD to Lt lateral trunk, 03/26/22 scapular clock movements  04/16/2022 Supine scapular series except sword x 5. Gave yellow band 04/23/2022 Standing scapular retraction, shoulder extension, bilateral ER with yellow, elevation with back against wall, SB stretch with arm overhead, and single arm pec stretch. ASSESSMENT:   CLINICAL IMPRESSION: Pt still limited with AROM of L shoulder. She guards during PROM and requires v/c to relax. She recently had a treatment so she was more short of breath during exercises but was able to complete more reps of ball up the wall and scapular retraction today and had less recovery periods compared to last session.   Pt will benefit from skilled therapeutic intervention to improve on the following deficits: Decreased knowledge of precautions, impaired UE functional use, pain, decreased ROM, postural dysfunction.    PT treatment/interventions: ADL/Self  care home management, Therapeutic exercises, Therapeutic activity, Neuromuscular re-education, Balance training, Gait training, Patient/Family education, and Joint mobilization         GOALS: Goals reviewed with patient? Yes   LONG TERM GOALS:  (STG=LTG)   GOALS Name Target Date Goal status  1 Pt will demonstrate she has regained full shoulder ROM and function post operatively compared to baselines.  Baseline: 03/31/2022 MET for ROM except left extension  2 Patient will increase bilateral shoulder flexion to be >/= 140 degrees for increased ease reaching overhead.   03/03/2022 MET 03/03/2022  3 Patient will increase bilateral shoulder  abduction to be >/= 150 degrees for increased ability to obtain radiation positioning. 03/31/2022 MET  4 Patient will improve her DASH score to be </= 10 for improved overall UE function. 03/31/2022 Ongoing 41% today 18% on 03/26/22 22% 04/23/2022  5 Pt will be able to perform sidelying abduction with proper scapular movement to limit impingement 04/23/22 ongoing        PLAN: PT FREQUENCY/DURATION: 1x/week for 6 weeks, avoiding weeks with chemo   PLAN FOR NEXT SESSION: Add scapular movement and protraction/retraction work, cont forearms on wall activities as pt was challenged by this and can add to HEP when able; cont gentle PROM, STM to Lt  axillary and periscapular area; cont ball roll up wall, modified downward dog on wall  progress strength and use of UE's, Add reach to cabinet       [employer], PT 05/28/2022, 2:04 PM

## 2022-06-02 ENCOUNTER — Other Ambulatory Visit: Payer: Self-pay

## 2022-06-04 ENCOUNTER — Ambulatory Visit: Payer: BC Managed Care – PPO | Admitting: Physical Therapy

## 2022-06-04 ENCOUNTER — Encounter: Payer: Self-pay | Admitting: Physical Therapy

## 2022-06-04 DIAGNOSIS — R293 Abnormal posture: Secondary | ICD-10-CM

## 2022-06-04 DIAGNOSIS — Z483 Aftercare following surgery for neoplasm: Secondary | ICD-10-CM | POA: Diagnosis not present

## 2022-06-04 DIAGNOSIS — M25612 Stiffness of left shoulder, not elsewhere classified: Secondary | ICD-10-CM

## 2022-06-04 DIAGNOSIS — C50212 Malignant neoplasm of upper-inner quadrant of left female breast: Secondary | ICD-10-CM

## 2022-06-04 DIAGNOSIS — M6281 Muscle weakness (generalized): Secondary | ICD-10-CM

## 2022-06-04 NOTE — Therapy (Signed)
OUTPATIENT PHYSICAL THERAPY TREATMENT NOTE   Patient Name: Erika Hodge MRN: [id number] DOB:Mar 27, 1966, 56 y.o., female Today's Date: 06/04/2022  PCP: Bonnita Nasuti, MD REFERRING PROVIDER: Erroll Luna, MD  END OF SESSION:   PT End of Session - 06/04/22 1305     Visit Number 18    Number of Visits 26    Date for PT Re-Evaluation 07/16/22   on hold 1sst 2 weeks due to chemo   PT Start Time 1303    PT Stop Time 1354    PT Time Calculation (min) 51 min    Activity Tolerance Patient tolerated treatment well    Behavior During Therapy [facility] for tasks assessed/performed              Past Medical History:  Diagnosis Date   Anxiety    Complication of anesthesia    HA   Non Hodgkin's lymphoma ([place])    Psoriasis    Past Surgical History:  Procedure Laterality Date   BREAST BIOPSY Left 11/20/2021   BREAST RECONSTRUCTION WITH PLACEMENT OF TISSUE EXPANDER AND ALLODERM Bilateral 01/07/2022   Procedure: BILATERAL BREAST RECONSTRUCTION WITH PLACEMENT OF TISSUE EXPANDERS AND ALLODERM;  Surgeon: Irene Limbo, MD;  Location: [place];  Service: Plastics;  Laterality: Bilateral;   NIPPLE SPARING MASTECTOMY Right 01/07/2022   Procedure: RIGHT NIPPLE SPARING MASTECTOMY;  Surgeon: Erroll Luna, MD;  Location: [place];  Service: General;  Laterality: Right;   NIPPLE SPARING MASTECTOMY WITH SENTINEL LYMPH NODE BIOPSY Left 01/07/2022   Procedure: LEFT NIPPLE SPARING MASTECTOMY WITH SENTINEL LYMPH NODE BIOPSY;  Surgeon: Erroll Luna, MD;  Location: [place];  Service: General;  Laterality: Left;   TUBAL LIGATION     Patient Active Problem List   Diagnosis Date Noted   Hypokalemia 04/08/2022   Anemia of chronic disease 04/08/2022   Restless leg syndrome 04/08/2022   Obesity (BMI 30-39.9) 04/08/2022   Bronchiectasis with acute exacerbation ([place]) 04/07/2022   Neutropenic fever ([place]) 04/07/2022   Pulmonary nodules 04/07/2022    Anxiety    Hiatal hernia with GERD    Invasive lobular carcinoma of right breast, stage 1 ([place]) 01/24/2022   Genetic testing 12/11/2021   Family history of breast cancer 11/29/2021   Malignant neoplasm of upper-inner quadrant of left breast in female, estrogen receptor positive ([place]) 11/26/2021    REFERRING DIAG: s/p bilateral Mastectomies with left SLNB  THERAPY DIAG:  Stiffness of left shoulder, not elsewhere classified  Muscle weakness (generalized)  Aftercare following surgery for neoplasm  Abnormal posture  Malignant neoplasm of upper-inner quadrant of left breast in female, estrogen receptor positive ([place])  PERTINENT HISTORY: Patient was diagnosed on 09/10/2021 with 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 Two negative axillary nodes were removed on the left side. An incidental finding of right breast invasive lobular carcinoma was also found and is ER/PR positive and HER2 negative. It is weakly ER positive, PR negative and HER2 negative with a Ki67 of 20%. She has a left wrist plate from a previous surgery in 2006 and a history of non-Hodgkins Lymphoma in 1985.  PRECAUTIONS: left lymphedema risk, hx of Non-Hodgkins Lymphoma, left wrist plate from prior surgery  SUBJECTIVE:  The shoulder is doing good. I think it is getting easier to move.   Marland Kitchen PAIN:  Are you having pain? No  02/03/2022 OBSERVATIONS:  Right breast with increased edema compared to left. Left inferior breast incision appears to be slightly macerated. Non-stick pad applied under each breast to reduce fluid leakage. Left axillary incision appears to be healing well.    POSTURE:  Forward head, rounded shoulders   LYMPHEDEMA ASSESSMENT:    A/PROM Right 11/27/2021 Left 11/27/2021 Right 02/03/2022 Left  02/03/2022 Right 02/21/22 Left 02/21/22 Right 03/03/2022 Left 03/03/2022  Right 03/26/22 Left  03/26/22 Right  04/23/2022 Left 04/23/2022 L 06/04/22  Shoulder extension 38 48 39 32 61 54 51 58   60 52 66  Shoulder flexion 157 152 100 51 148 115 163 143  155 167 159 165  Shoulder abduction 165 163 60 58 169 120 179 133  165 180 179 150  Shoulder internal rotation 67 62 63 NT 54 55 68 64   68 66 82  Shoulder external rotation 77 77 84 NT 90 78 97 88   99 95 90                          (Blank rows = not tested)    LYMPHEDEMA ASSESSMENTS:    LANDMARK RIGHT 11/27/2021 LEFT 11/27/2021 RIGHT 02/03/2022 LEFT 02/03/2022  10 cm proximal to olecranon process 32 31.9 31.8 31.5  Olecranon process 27 27 27.7 26.4  10 cm proximal to ulnar styloid process 23.7 23 23.3 22.7  Just proximal to ulnar styloid process 16.4 16.8 16.1 16.7  Across hand at thumb web space 19 18.7 19 18.8  At base of 2nd digit 6.8 7.5 6.7 7.5  (Blank rows = not tested)   Treatment:  05/28/22: Therapeutic Exs Ball roll up wall into flexion x10 and then Lt UE abd x10 returning therapist demo with pt feeling fatigue by the end of this Forearms on wall for scapular retraction x10 each, then forearm walking up wall x5 with pt reporting increased fatigue with this Manual Therapy Soft tissue mobilization to left lats and serratus in R S/[place] to medial scapular border where pt palpably very tight PROM left shoulder abduction while performing MFR to area of cording in L axilla Scapular mobilization in to protraction and retraction in R S/L with pt very tight with limited mobility at first but this improved with manual therapy 05/28/22: Therapeutic Exs Ball roll up wall into flexion x10 and then Lt UE abd x10 returning therapist demo with pt feeling fatigue by the end of this Forearms on wall with yellow theraband for scapular retraction x10 each, then forearm walking up wall x5 with pt reporting increased fatigue with this Manual Therapy Soft tissue mobilization to left, pectorals and lats in supine, then in  Rt S/L with cocoa butter for STM to medial scapular border and UT where pt palpably very tight PROM left shoulder flexion, abd, and D2 to pts available end motions which did improve by end of session, pt required frequent cues to relax 05/14/22: Therapeutic Exs Ball roll up wall into flexion x10 and then Lt UE abd x5 returning therapist demo then reports UE fatigue so stopped Forearms on wall with yellow theraband for scapular retraction x10 each, seated after due to fatigue, then forearm walking up wall x5 each returning therapist demo for each.  Manual Therapy Soft tissue mobilization to left, pectorals and lats in supine, then in Rt S/L with cocoa butter for STm to medial scapular border and UT where pt palpably very tight and tender to touch at UT PROM left shoulder flexion, abd,  and D2 to pts available end motions which did improve by end of session and she reported feeling less tightness after session  05/07/2022 Soft tissue mobilization to left UT, pectorals and lats in supine and UT and scapular area in right S/L.Marland Kitchen PROM left shoulder flexion, abd, ER. AROM Supine flexion, scaption and horizontal abduction x 5. Supine alphabet x 1, supine serratus punch x 15 Reviewed standing theraband;Scapular retraction, shoulder extension, bilateral ER with yellow x 10, standing shoulder elevation x10. Also right SB stretch with left arm overhead  standing or right SL,and left pectoral stretch on wall. Pt was given illustrated instructions  04/23/2022 Left trunk observed for swelling but none noted today. Pt was tender in left lats and feels very muscular.  Assessed shoulder ROM with very good results bilaterally. Soft tissue mobilization to left UT, pectorals and lats in supine and UT and scapular area in right S/L.Marland Kitchen Instructed pt in standing theraband;Scapular retraction, shoulder extension, bilateral ER with yellow x 10, standing shoulder elevation x10. Also right SB stretch with left arm overhead   standing or right SL,and left pectoral stretch on wall. Pt was given illustrated instructions             Surgery type/Date: 01/07/2022 Number of lymph nodes removed: 2 on left Current/past treatment (chemo, radiation, hormone therapy): Radiation to chest for Non-hodgkins lymphoma in 1985 Other symptoms:  Heaviness/tightness Yes Pain Yes Pitting edema No Infections No Decreased scar mobility Yes Stemmer sign No     PATIENT EDUCATION:   Access Code: [facility] URL: [email or web address] Date: 05/07/2022 Prepared by: Cheral Almas  Exercises - Supine Shoulder Alphabet  - 1 x daily - 7 x weekly - 1 sets - 1 reps - Supine Single Arm Scapular Protraction  - 1 x daily - 7 x weekly - 1-2 sets - 10 reps   Access Code: [email or web address] URL: [email or web address] Date: 04/23/2022 Prepared by: Cheral Almas  Exercises - Single Arm Doorway Pec Stretch at 60 Elevation  - 1 x daily - 7 x weekly - 1 sets - 3 reps - 15 hold Also standing scapular retraction, shoulder extension and bilateral ER x 10, shoulder elevation x 10- Chest Stretch with Shoulder Squeeze  - 1 x daily - 7 x weekly - 5 reps - 5-10 hold  Person educated: Patient  Education method: Explanation, demonstration, handout Education comprehension: verbalized, demonstrated understanding, and pt returned demonstration      HOME EXERCISE PROGRAM:  Reviewed previously given post op HEP. Performed supine clasped hands flexion and stargazer today with improved ease 4/14- issued supine dowel flexion and abduction , 03/05/2022 Lower trunk rotation with arms outstretched; 03/10/22: Self MLD to Lt lateral trunk, 03/26/22 scapular clock movements  04/16/2022 Supine scapular series except sword x 5. Gave yellow band 04/23/2022 Standing scapular retraction, shoulder extension, bilateral ER with yellow, elevation with back against wall, SB stretch with arm overhead, and single arm pec stretch. ASSESSMENT:   CLINICAL  IMPRESSION: Assessed pt's progress towards goals in therapy. She demonstrates decreased L shoulder abduction today most likely due to development of a new cord in her left axilla due to overuse at work last night. Spent time stretching this area today. Also focused on scapular mobilization as pt has very little scapular mobility. Pt would benefit from skilled PT services to improve L shoulder abduction, decrease tightness and improve scapular mobility and strength.   Pt will benefit from skilled therapeutic intervention to improve on the following deficits: Decreased knowledge of precautions, impaired UE functional use,  pain, decreased ROM, postural dysfunction.    PT treatment/interventions: ADL/Self care home management, Therapeutic exercises, Therapeutic activity, Neuromuscular re-education, Balance training, Gait training, Patient/Family education, and Joint mobilization         GOALS: Goals reviewed with patient? Yes   LONG TERM GOALS:  (STG=LTG)   GOALS Name Target Date Goal status  1 Pt will demonstrate she has regained full shoulder ROM and function post operatively compared to baselines.  Baseline: 03/31/2022 MET for ROM except left extension  2 Patient will increase bilateral shoulder flexion to be >/= 140 degrees for increased ease reaching overhead.   03/03/2022 MET 03/03/2022  3 Patient will increase bilateral shoulder abduction to be >/= 150 degrees for increased ability to obtain radiation positioning. 03/31/2022 MET  4 Patient will improve her DASH score to be </= 10 for improved overall UE function. 03/31/2022 Ongoing 41% today 18% on 03/26/22 22% 04/23/2022 18% 06/04/22  5 Pt will be able to perform sidelying abduction with proper scapular movement to limit impingement 04/23/22 Ongoing - still having pain with this as of 06/04/22        PLAN: PT FREQUENCY/DURATION: 1x/week for 6 weeks, avoiding weeks with chemo   PLAN FOR NEXT SESSION: contscapular movement and  protraction/retraction work, cont forearms on wall activities as pt was challenged by this and can add to HEP when able; cont gentle PROM, STM to Lt  axillary and periscapular area; cont ball roll up wall, modified downward dog on wall  progress strength and use of UE's, Add reach to cabinet       [employer], PT 06/04/2022, 2:02 PM

## 2022-06-05 ENCOUNTER — Other Ambulatory Visit: Payer: Self-pay

## 2022-06-05 DIAGNOSIS — Z170 Estrogen receptor positive status [ER+]: Secondary | ICD-10-CM

## 2022-06-05 DIAGNOSIS — D649 Anemia, unspecified: Secondary | ICD-10-CM

## 2022-06-05 MED FILL — Dexamethasone Sodium Phosphate Inj 100 MG/10ML: INTRAMUSCULAR | Qty: 1 | Status: AC

## 2022-06-06 ENCOUNTER — Inpatient Hospital Stay: Payer: BC Managed Care – PPO

## 2022-06-06 ENCOUNTER — Encounter: Payer: Self-pay | Admitting: *Deleted

## 2022-06-06 ENCOUNTER — Other Ambulatory Visit: Payer: Self-pay

## 2022-06-06 ENCOUNTER — Inpatient Hospital Stay (HOSPITAL_BASED_OUTPATIENT_CLINIC_OR_DEPARTMENT_OTHER): Payer: BC Managed Care – PPO | Admitting: Hematology

## 2022-06-06 ENCOUNTER — Encounter: Payer: Self-pay | Admitting: Hematology

## 2022-06-06 VITALS — BP 134/70 | HR 98 | Resp 18 | Wt 181.1 lb

## 2022-06-06 VITALS — Temp 97.6°F

## 2022-06-06 DIAGNOSIS — Z170 Estrogen receptor positive status [ER+]: Secondary | ICD-10-CM | POA: Diagnosis not present

## 2022-06-06 DIAGNOSIS — Z5111 Encounter for antineoplastic chemotherapy: Secondary | ICD-10-CM | POA: Diagnosis not present

## 2022-06-06 DIAGNOSIS — C50911 Malignant neoplasm of unspecified site of right female breast: Secondary | ICD-10-CM

## 2022-06-06 DIAGNOSIS — C50212 Malignant neoplasm of upper-inner quadrant of left female breast: Secondary | ICD-10-CM | POA: Diagnosis not present

## 2022-06-06 DIAGNOSIS — C50919 Malignant neoplasm of unspecified site of unspecified female breast: Secondary | ICD-10-CM

## 2022-06-06 DIAGNOSIS — D649 Anemia, unspecified: Secondary | ICD-10-CM

## 2022-06-06 LAB — CBC WITH DIFFERENTIAL (CANCER CENTER ONLY)
Abs Immature Granulocytes: 0.01 10*3/uL (ref 0.00–0.07)
Basophils Absolute: 0.2 10*3/uL — ABNORMAL HIGH (ref 0.0–0.1)
Basophils Relative: 4 %
Eosinophils Absolute: 0.1 10*3/uL (ref 0.0–0.5)
Eosinophils Relative: 2 %
HCT: 30.3 % — ABNORMAL LOW (ref 36.0–46.0)
Hemoglobin: 9.6 g/dL — ABNORMAL LOW (ref 12.0–15.0)
Immature Granulocytes: 0 %
Lymphocytes Relative: 22 %
Lymphs Abs: 1.2 10*3/uL (ref 0.7–4.0)
MCH: 29.6 pg (ref 26.0–34.0)
MCHC: 31.7 g/dL (ref 30.0–36.0)
MCV: 93.5 fL (ref 80.0–100.0)
Monocytes Absolute: 1 10*3/uL (ref 0.1–1.0)
Monocytes Relative: 20 %
Neutro Abs: 2.7 10*3/uL (ref 1.7–7.7)
Neutrophils Relative %: 52 %
Platelet Count: 598 10*3/uL — ABNORMAL HIGH (ref 150–400)
RBC: 3.24 MIL/uL — ABNORMAL LOW (ref 3.87–5.11)
RDW: 18.3 % — ABNORMAL HIGH (ref 11.5–15.5)
WBC Count: 5.2 10*3/uL (ref 4.0–10.5)
nRBC: 0 % (ref 0.0–0.2)

## 2022-06-06 LAB — FERRITIN: Ferritin: 111 ng/mL (ref 11–307)

## 2022-06-06 LAB — CMP (CANCER CENTER ONLY)
ALT: 12 U/L (ref 0–44)
AST: 16 U/L (ref 15–41)
Albumin: 3.8 g/dL (ref 3.5–5.0)
Alkaline Phosphatase: 79 U/L (ref 38–126)
Anion gap: 6 (ref 5–15)
BUN: 15 mg/dL (ref 6–20)
CO2: 27 mmol/L (ref 22–32)
Calcium: 9.3 mg/dL (ref 8.9–10.3)
Chloride: 105 mmol/L (ref 98–111)
Creatinine: 0.76 mg/dL (ref 0.44–1.00)
GFR, Estimated: >60 mL/min (ref >60)
Glucose, Bld: 92 mg/dL (ref 70–99)
Potassium: 4.2 mmol/L (ref 3.5–5.1)
Sodium: 138 mmol/L (ref 135–145)
Total Bilirubin: 0.3 mg/dL (ref 0.3–1.2)
Total Protein: 7.1 g/dL (ref 6.5–8.1)

## 2022-06-06 LAB — IRON AND IRON BINDING CAPACITY (CC-WL,HP ONLY)
Iron: 70 ug/dL (ref 28–170)
Saturation Ratios: 22 % (ref 10.4–31.8)
TIBC: 316 ug/dL (ref 250–450)
UIBC: 246 ug/dL (ref 148–442)

## 2022-06-06 MED ORDER — PALONOSETRON HCL INJECTION 0.25 MG/5ML
0.2500 mg | Freq: Once | INTRAVENOUS | Status: AC
  Administered 2022-06-06: 0.25 mg via INTRAVENOUS
  Filled 2022-06-06: qty 5

## 2022-06-06 MED ORDER — ROPINIROLE HCL 0.5 MG PO TABS: 0.5000 mg | ORAL_TABLET | Freq: Every day | ORAL | 0 refills | Status: DC

## 2022-06-06 MED ORDER — SODIUM CHLORIDE 0.9 % IV SOLN
10.0000 mg | Freq: Once | INTRAVENOUS | Status: AC
  Administered 2022-06-06: 10 mg via INTRAVENOUS
  Filled 2022-06-06: qty 10

## 2022-06-06 MED ORDER — SODIUM CHLORIDE 0.9 % IV SOLN: Freq: Once | INTRAVENOUS | Status: AC

## 2022-06-06 MED ORDER — SODIUM CHLORIDE 0.9 % IV SOLN
65.0000 mg/m2 | Freq: Once | INTRAVENOUS | Status: AC
  Administered 2022-06-06: 120 mg via INTRAVENOUS
  Filled 2022-06-06: qty 12

## 2022-06-06 MED ORDER — SODIUM CHLORIDE 0.9 % IV SOLN
600.0000 mg/m2 | Freq: Once | INTRAVENOUS | Status: AC
  Administered 2022-06-06: 1140 mg via INTRAVENOUS
  Filled 2022-06-06: qty 57

## 2022-06-06 NOTE — Progress Notes (Signed)
[place]   [phone]) [phone] [phone]) [phone]   Clinic Follow up Note   Patient Care Team: Bonnita Nasuti, MD as PCP - General (Internal Medicine) Erroll Luna, MD as Consulting Physician (General Surgery) Truitt Merle, MD as Consulting Physician (Hematology) Gery Pray, MD as Consulting Physician (Radiation Oncology) Mauro Kaufmann, RN as Oncology Nurse Navigator Rockwell Germany, RN as Oncology Nurse Navigator  Date of Service:  06/06/2022  CHIEF COMPLAINT: f/u of bilateral breast cancer  CURRENT THERAPY:  Adjuvant TC, q21d, starting 02/12/22  ASSESSMENT & PLAN:  [facility] is a 56 y.o. post-menopausal female with   1. 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 -she opted to proceed with b/l mastectomies with reconstruction on 01/07/22 with Dr. Brantley Stage and Dr. Iran Planas. Pathology revealed b/l breast cancers. Left breast showed 2.1 cm invasive and in situ ductal carcinoma. Margins and lymph nodes negative (0/2). Repeated ER/PR/HER2 were all negative (ER 10% weak (+) on biopsy) -she developed left IMF necrotic tissue, excised by Dr. Iran Planas on 01/27/22. -because she was likely treated with adriamycin for her lymphoma, she is not a candidate for more of this medicine. Therefore, I recommend 6 cycles of TC (docetaxel and cytoxan). She declined port placement.  -she began TC on 02/12/22. She tolerates fairly overall with moderate fatigue and shortness of breath. She also experienced intense bone pain after GCF-S injection, and it was held for cycle 3. She was hospitalized after C3 with neutropenia and sepsis.   -labs reviewed, hgb stable at 9.6, plt [id number]. She endorses taking prenatal vitamins. She will not need GCF-S injection as this is her last cycle.  I again reviewed neutropenia precautions. -No adjuvant  radiation recommended due to her previous radiation for lymphoma. -She will follow-up with her plastic surgeon Dr. Iran Planas next week, is eager to have bilateral tissue expander removal and implant placement.   2. Invasive lobular carcinoma of right breast, stage I pT1c, ER weakly+/PR+/Her2-, and DCIS  -incidental finding on b/l mastectomies on 01/07/22 for left breast cancer. Pathology from right breast showed: 1.1 cm invasive lobular carcinoma, grade 1, in LIQ; 1 cm DCIS, intermediate grade, in UIQ. ILC showed ER 30% weakly positive, PR 80% moderately positive, Her2 negative. ILC focally involves adjacent anterior margin.  -we again discuss antiestrogen therapy today.  I reviewed benefit and potential side effects.  She is reluctant to try. Given her fear of side effects, especially arthralgia, I recommend tamoxifen, and I will let her start at low dose, per her request.   3. 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 -per pt, she also received three years of systemic treatment. She reports she was treated at [facility]. Her records were too old to be received, but she likely received adriamycin during that time.   4. Psoriasis  -previously on Tremfya injections, was told by dermatology that she could not restart due to h/o cancer. -I do not recommend immunotherapy because of this. -followed by Dr. Abner Greenspan with [facility], per pt     PLAN:  -proceed with final TC today at same dose, no GCSF after this cycle  -lab and f/u in 1 month, plan to let her try tamoxifen after next visit, will start at low-dose 10 mg daily   No problem-specific Assessment & Plan notes found  for this encounter.   SUMMARY OF ONCOLOGIC HISTORY: Oncology History Overview Note   Cancer Staging  Invasive lobular carcinoma of right breast, stage 1 ([place]) Staging form: Breast, AJCC 8th Edition - Clinical stage from 01/07/2022: Stage Unknown (cT1c, cNX, cM0,  G1, ER+, PR+, HER2-) - Signed by Truitt Merle, MD on 01/24/2022  Malignant neoplasm of upper-inner quadrant of left breast in female, estrogen receptor positive ([place]) Staging form: Breast, AJCC 8th Edition - Clinical stage from 11/20/2021: Stage IA (cT1c, cN0, cM0, G2, ER+, PR-, HER2-) - Signed by Truitt Merle, MD on 11/26/2021 - Pathologic stage from 01/07/2022: Stage IIA (pT2, pN0, cM0, G2, ER-, PR-, HER2-) - Signed by Truitt Merle, MD on 01/24/2022     Malignant neoplasm of upper-inner quadrant of left breast in female, estrogen receptor positive ([place])  11/05/2021 Mammogram   CLINICAL DATA:  Screening recall for a possible left breast asymmetry.   EXAM: DIGITAL DIAGNOSTIC UNILATERAL LEFT MAMMOGRAM WITH TOMOSYNTHESIS AND CAD; ULTRASOUND LEFT BREAST LIMITED  IMPRESSION: 1. Irregular 1.2 cm mass in the left breast at 11 o'clock, 2 cm the nipple, suspicious for malignancy.   11/20/2021 Cancer Staging   Staging form: Breast, AJCC 8th Edition - Clinical stage from 11/20/2021: Stage IA (cT1c, cN0, cM0, G2, ER+, PR-, HER2-) - Signed by Truitt Merle, MD on 11/26/2021 Stage prefix: Initial diagnosis Histologic grading system: 3 grade system   11/20/2021 Initial Biopsy   Diagnosis Breast, left, needle core biopsy, 11 o'clock, ribbon clip - INVASIVE DUCTAL CARCINOMA - DUCTAL CARCINOMA IN SITU - SEE COMMENT Microscopic Comment based on the biopsy, the carcinoma appears Nottingham grade 2 of 3 and measures 0.6 cm in greatest linear extent.  PROGNOSTIC INDICATORS Results: The tumor cells are NEGATIVE for Her2 (1+). Estrogen Receptor: 10%, POSITIVE, WEAK STAINING INTENSITY Progesterone Receptor: 0%, NEGATIVE Proliferation Marker Ki67: 20%   11/26/2021 Initial Diagnosis   Malignant neoplasm of upper-inner quadrant of left breast in female, estrogen receptor positive ([place])   11/27/2021 Genetic Testing   [facility] CancerNext was Negative. Report date is 12/11/2021.  The CancerNext gene panel offered by [employer] includes sequencing, rearrangement analysis, and RNA analysis for the following 36 genes:   APC, ATM, AXIN2, BARD1, BMPR1A, BRCA1, BRCA2, BRIP1, CDH1, CDK4, CDKN2A, CHEK2, DICER1, HOXB13, EPCAM, GREM1, MLH1, MSH2, MSH3, MSH6, MUTYH, NBN, NF1, NTHL1, PALB2, PMS2, POLD1, POLE, PTEN, RAD51C, RAD51D, RECQL, SMAD4, SMARCA4, STK11, and TP53.    01/07/2022 Cancer Staging   Staging form: Breast, AJCC 8th Edition - Pathologic stage from 01/07/2022: Stage IIA (pT2, pN0, cM0, G2, ER-, PR-, HER2-) - Signed by Truitt Merle, MD on 01/24/2022 Stage prefix: Initial diagnosis Histologic grading system: 3 grade system Residual tumor (R): R0 - None   01/07/2022 Definitive Surgery   FINAL MICROSCOPIC DIAGNOSIS:   A. BREAST, RIGHT NIPPLE, BIOPSY:  - Benign breast tissue.  - No malignancy identified.   B. BREAST, LEFT NIPPLE, BIOPSY:  - Benign breast tissue.  - No malignancy identified.   C. BREAST, RIGHT, MASTECTOMY:  - Invasive lobular carcinoma, 1.1 cm.  - Ductal carcinoma in situ, 1 cm.  - Invasive carcinoma focally extends to the anterior margin.  - Fibrocystic changes with usual ductal hyperplasia and calcifications.  - Sclerosing adenosis and fibroadenomatoid change.  - See oncology table and comment.   D. LYMPH NODE, LEFT AXILLARY, SENTINEL, EXCISION:  - One lymph node negative for metastatic carcinoma (0/1).   E. LYMPH NODE, LEFT AXILLARY, SENTINEL, EXCISION:  - One lymph node negative for metastatic carcinoma (  0/1).   F. BREAST, LEFT, MASTECTOMY:  - Invasive and in situ ductal carcinoma, 2.1 cm.  - Margins negative for carcinoma.  - See oncology table.    01/07/2022 Receptors her2   F1. PROGNOSTIC INDICATOR RESULTS:   The tumor cells are NEGATIVE for Her2 (0).   Estrogen Receptor: NEGATIVE  Progesterone Receptor: NEGATIVE  Proliferation Marker Ki-67: 10%    02/12/2022 -  Chemotherapy   Patient is on Treatment Plan : BREAST TC q21d     Invasive lobular carcinoma of right breast,  stage 1 ([place])  11/27/2021 Genetic Testing   [facility] Cancer[facility] was Negative. Report date is 12/11/2021.  The CancerNext gene panel offered by [employer] includes sequencing, rearrangement analysis, and RNA analysis for the following 36 genes:   APC, ATM, AXIN2, BARD1, BMPR1A, BRCA1, BRCA2, BRIP1, CDH1, CDK4, CDKN2A, CHEK2, DICER1, HOXB13, EPCAM, GREM1, MLH1, MSH2, MSH3, MSH6, MUTYH, NBN, NF1, NTHL1, PALB2, PMS2, POLD1, POLE, PTEN, RAD51C, RAD51D, RECQL, SMAD4, SMARCA4, STK11, and TP53.    01/07/2022 Cancer Staging   Staging form: Breast, AJCC 8th Edition - Clinical stage from 01/07/2022: Stage Unknown (cT1c, cNX, cM0, G1, ER+, PR+, HER2-) - Signed by Truitt Merle, MD on 01/24/2022 Stage prefix: Initial diagnosis Histologic grading system: 3 grade system   01/07/2022 Definitive Surgery   FINAL MICROSCOPIC DIAGNOSIS:   A. BREAST, RIGHT NIPPLE, BIOPSY:  - Benign breast tissue.  - No malignancy identified.   B. BREAST, LEFT NIPPLE, BIOPSY:  - Benign breast tissue.  - No malignancy identified.   C. BREAST, RIGHT, MASTECTOMY:  - Invasive lobular carcinoma, 1.1 cm.  - Ductal carcinoma in situ, 1 cm.  - Invasive carcinoma focally extends to the anterior margin.  - Fibrocystic changes with usual ductal hyperplasia and calcifications.  - Sclerosing adenosis and fibroadenomatoid change.  - See oncology table and comment.   D. LYMPH NODE, LEFT AXILLARY, SENTINEL, EXCISION:  - One lymph node negative for metastatic carcinoma (0/1).   E. LYMPH NODE, LEFT AXILLARY, SENTINEL, EXCISION:  - One lymph node negative for metastatic carcinoma (0/1).   F. BREAST, LEFT, MASTECTOMY:  - Invasive and in situ ductal carcinoma, 2.1 cm.  - Margins negative for carcinoma.  - See oncology table.    01/07/2022 Receptors her2   C7.  PROGNOSTIC INDICATOR RESULTS:   The tumor cells are NEGATIVE for Her2 (1+).   Estrogen Receptor: POSITIVE, 30% WEAK STAINING INTENSITY  Progesterone Receptor: POSITIVE, 80%  MODERATE STAINING INTENSITY  Proliferation Marker Ki-67: <5%    01/24/2022 Initial Diagnosis   Invasive lobular carcinoma of right breast, stage 1 ([place])      INTERVAL HISTORY:  ICA DAYE is here for a follow up of breast cancer. She was last seen by me on 05/16/22. She presents to the clinic accompanied by her husband. They report it takes her 14-16 days to recover well. She reports she had fatigue, shortness of breath, and bone pain from GCF-S.   All other systems were reviewed with the patient and are negative.  MEDICAL HISTORY:  Past Medical History:  Diagnosis Date   Anxiety    Complication of anesthesia    HA   Non Hodgkin's lymphoma ([place])    Psoriasis     SURGICAL HISTORY: Past Surgical History:  Procedure Laterality Date   BREAST BIOPSY Left 11/20/2021   BREAST RECONSTRUCTION WITH PLACEMENT OF TISSUE EXPANDER AND ALLODERM Bilateral 01/07/2022   Procedure: BILATERAL BREAST RECONSTRUCTION WITH PLACEMENT OF TISSUE EXPANDERS AND ALLODERM;  Surgeon: Irene Limbo, MD;  Location: [facility];  Service: Plastics;  Laterality: Bilateral;   NIPPLE SPARING MASTECTOMY Right 01/07/2022   Procedure: RIGHT NIPPLE SPARING MASTECTOMY;  Surgeon: Erroll Luna, MD;  Location: [place];  Service: General;  Laterality: Right;   NIPPLE SPARING MASTECTOMY WITH SENTINEL LYMPH NODE BIOPSY Left 01/07/2022   Procedure: LEFT NIPPLE SPARING MASTECTOMY WITH SENTINEL LYMPH NODE BIOPSY;  Surgeon: Erroll Luna, MD;  Location: [place];  Service: General;  Laterality: Left;   TUBAL LIGATION      I have reviewed the social history and family history with the patient and they are unchanged from previous note.  ALLERGIES:  is allergic to amoxicillin, ibuprofen, ciprofloxacin, and codeine.  MEDICATIONS:  Current Outpatient Medications  Medication Sig Dispense Refill   acetaminophen (TYLENOL) 500 MG tablet Take 1,000 mg by mouth every 6 (six)  hours as needed for mild pain or fever.     albuterol (PROVENTIL) (2.5 MG/3ML) 0.083% nebulizer solution Take 3 mLs (2.5 mg total) by nebulization every 6 (six) hours as needed for wheezing or shortness of breath. 75 mL 6   albuterol (VENTOLIN HFA) 108 (90 Base) MCG/ACT inhaler Inhale 2 puffs into the lungs every 6 (six) hours as needed for wheezing or shortness of breath. (Patient not taking: Reported on 04/23/2022) 8 g 2   ALPRAZolam (XANAX) 0.5 MG tablet Take 0.5 mg by mouth daily as needed for anxiety or sleep.     ASHWAGANDHA PO Take 1 tablet by mouth daily.     Calcipotriene-Betameth Diprop (ENSTILAR) 0.005-0.064 % FOAM Apply 1 application topically as needed (apply to psoriasis area as needed).     dexamethasone (DECADRON) 4 MG tablet Take 1 tablet (4 mg total) by mouth 2 (two) times daily. Start the day before Taxotere. Then change to one tablet daily after chemo for 3 days. 30 tablet 1   FLUoxetine (PROZAC) 20 MG capsule Take 20 mg by mouth daily.     fluticasone (FLONASE) 50 MCG/ACT nasal spray Place 2 sprays into both nostrils daily. 15.8 mL 2   loratadine (CLARITIN) 10 MG tablet Take 10 mg by mouth daily.     omeprazole (PRILOSEC) 40 MG capsule Take 40 mg by mouth daily.     ondansetron (ZOFRAN) 8 MG tablet Take 1 tablet (8 mg total) by mouth 2 (two) times daily as needed for refractory nausea / vomiting. Start on day 3 after chemo. 30 tablet 1   oxyCODONE (OXY IR/ROXICODONE) 5 MG immediate release tablet Take 0.5-1 tablets (2.5-5 mg total) by mouth every 8 (eight) hours as needed for severe pain. 20 tablet 0   prochlorperazine (COMPAZINE) 10 MG tablet Take 1 tablet (10 mg total) by mouth every 6 (six) hours as needed (Nausea or vomiting). 30 tablet 1   rOPINIRole (REQUIP) 0.5 MG tablet Take 1 tablet (0.5 mg total) by mouth at bedtime. 30 tablet 0   senna-docusate (SENOKOT-S) 8.6-50 MG tablet Take 1 tablet by mouth at bedtime as needed for mild constipation. 30 tablet 0   zolpidem (AMBIEN)  10 MG tablet Take 10 mg by mouth at bedtime as needed for sleep.     No current facility-administered medications for this visit.    PHYSICAL EXAMINATION: ECOG PERFORMANCE STATUS: 1 - Symptomatic but completely ambulatory  Vitals:   06/06/22 1213  BP: 134/70  Pulse: 98  Resp: 18  SpO2: 97%   Wt Readings from Last 3 Encounters:  06/06/22 181 lb 2 oz (82.2 kg)  05/16/22 178 lb  8 oz (81 kg)  04/25/22 180 lb 8 oz (81.9 kg)     GENERAL:alert, no distress and comfortable SKIN: skin color normal, no rashes or significant lesions EYES: normal, Conjunctiva are pink and non-injected, sclera clear  NEURO: alert & oriented x 3 with fluent speech  LABORATORY DATA:  I have reviewed the data as listed    Latest Ref Rng & Units 06/06/2022   11:42 AM 05/16/2022    1:13 PM 04/25/2022   12:14 PM  CBC  WBC 4.0 - 10.5 K/uL 5.2  7.6  9.7   Hemoglobin 12.0 - 15.0 g/dL 9.6  9.7  11.0   Hematocrit 36.0 - 46.0 % 30.3  31.0  34.4   Platelets 150 - 400 K/uL 598  737  467         Latest Ref Rng & Units 06/06/2022   11:42 AM 05/16/2022    1:13 PM 04/25/2022   12:14 PM  CMP  Glucose 70 - 99 mg/dL 92  105  137   BUN 6 - 20 mg/dL _0 Creatinine 0.44 - 1.00 mg/dL 0.76  0.69  0.73   Sodium 135 - 145 mmol/L 138  138  136   Potassium 3.5 - 5.1 mmol/L 4.2  3.8  3.7   Chloride 98 - 111 mmol/L 105  104  104   CO2 22 - 32 mmol/L _1 Calcium 8.9 - 10.3 mg/dL 9.3  10.0  9.9   Total Protein 6.5 - 8.1 g/dL 7.1  7.6  7.4   Total Bilirubin 0.3 - 1.2 mg/dL 0.3  0.2  0.3   Alkaline Phos 38 - 126 U/L 79  87  67   AST 15 - 41 U/L _2 ALT 0 - 44 U/L _3 RADIOGRAPHIC STUDIES: I have personally reviewed the radiological images as listed and agreed with the findings in the report. No results found.    No orders of the defined types were placed in this encounter.  All questions were answered. The patient knows to call the clinic with any problems, questions or concerns.  No barriers to learning was detected. The total time spent in the appointment was 30 minutes.     Truitt Merle, MD 06/06/2022   I, Wilburn Mylar, am acting as scribe for Truitt Merle, MD.   I have reviewed the above documentation for accuracy and completeness, and I agree with the above.

## 2022-06-06 NOTE — Patient Instructions (Signed)
[facility]  Discharge Instructions: Thank you for choosing [facility] to provide your oncology and hematology care.   If you have a lab appointment with the Cancer Center, please go directly to the Cancer Center and check in at the registration area.   Wear comfortable clothing and clothing appropriate for easy access to any Portacath or PICC line.   We strive to give you quality time with your provider. You may need to reschedule your appointment if you arrive late (15 or more minutes).  Arriving late affects you and other patients whose appointments are after yours.  Also, if you miss three or more appointments without notifying the office, you may be dismissed from the clinic at the provider's discretion.      For prescription refill requests, have your pharmacy contact our office and allow 72 hours for refills to be completed.    Today you received the following chemotherapy and/or immunotherapy agents : Taxotere, Cytoxan     To help prevent nausea and vomiting after your treatment, we encourage you to take your nausea medication as directed.  BELOW ARE SYMPTOMS THAT SHOULD BE REPORTED IMMEDIATELY: *FEVER GREATER THAN 100.4 F (38 C) OR HIGHER *CHILLS OR SWEATING *NAUSEA AND VOMITING THAT IS NOT CONTROLLED WITH YOUR NAUSEA MEDICATION *UNUSUAL SHORTNESS OF BREATH *UNUSUAL BRUISING OR BLEEDING *URINARY PROBLEMS (pain or burning when urinating, or frequent urination) *BOWEL PROBLEMS (unusual diarrhea, constipation, pain near the anus) TENDERNESS IN MOUTH AND THROAT WITH OR WITHOUT PRESENCE OF ULCERS (sore throat, sores in mouth, or a toothache) UNUSUAL RASH, SWELLING OR PAIN  UNUSUAL VAGINAL DISCHARGE OR ITCHING   Items with * indicate a potential emergency and should be followed up as soon as possible or go to the Emergency Department if any problems should occur.  Please show the CHEMOTHERAPY ALERT CARD or IMMUNOTHERAPY ALERT CARD at  check-in to the Emergency Department and triage nurse.  Should you have questions after your visit or need to cancel or reschedule your appointment, please contact [facility]  Dept: [phone]  and follow the prompts.  Office hours are 8:00 a.m. to 4:30 p.m. Monday - Friday. Please note that voicemails left after 4:00 p.m. may not be returned until the following business day.  We are closed weekends and major holidays. You have access to a nurse at all times for urgent questions. Please call the main number to the clinic Dept: [phone] and follow the prompts.   For any non-urgent questions, you may also contact your provider using MyChart. We now offer e-Visits for anyone 18 and older to request care online for non-urgent symptoms. For details visit [email or web address].   Also download the MyChart app! Go to the app store, search "MyChart", open the app, select [facility], and log in with your MyChart username and password.  Due to Covid, a mask is required upon entering the hospital/clinic. If you do not have a mask, one will be given to you upon arrival. For doctor visits, patients may have 1 support person aged 18 or older with them. For treatment visits, patients cannot have anyone with them due to current Covid guidelines and our immunocompromised population.   

## 2022-06-09 ENCOUNTER — Inpatient Hospital Stay: Payer: BC Managed Care – PPO

## 2022-06-11 ENCOUNTER — Telehealth: Payer: Self-pay | Admitting: Hematology

## 2022-06-11 NOTE — Telephone Encounter (Signed)
Scheduled follow-up appointment per 7/28 los. Patient is aware. 

## 2022-06-23 ENCOUNTER — Ambulatory Visit: Payer: BC Managed Care – PPO | Attending: Surgery

## 2022-06-23 VITALS — Wt 184.4 lb

## 2022-06-23 DIAGNOSIS — M25612 Stiffness of left shoulder, not elsewhere classified: Secondary | ICD-10-CM | POA: Insufficient documentation

## 2022-06-23 DIAGNOSIS — C50212 Malignant neoplasm of upper-inner quadrant of left female breast: Secondary | ICD-10-CM | POA: Insufficient documentation

## 2022-06-23 DIAGNOSIS — R293 Abnormal posture: Secondary | ICD-10-CM | POA: Insufficient documentation

## 2022-06-23 DIAGNOSIS — Z483 Aftercare following surgery for neoplasm: Secondary | ICD-10-CM | POA: Insufficient documentation

## 2022-06-23 DIAGNOSIS — Z170 Estrogen receptor positive status [ER+]: Secondary | ICD-10-CM | POA: Insufficient documentation

## 2022-06-23 DIAGNOSIS — M6281 Muscle weakness (generalized): Secondary | ICD-10-CM | POA: Insufficient documentation

## 2022-06-23 NOTE — Therapy (Signed)
OUTPATIENT PHYSICAL THERAPY SOZO SCREENING NOTE   Patient Name: E[facility] MRN: [id number] DOB:02/12/1966, 56 y.o., female Today's Date: 06/23/2022  PCP: Hague, Imran P, MD REFERRING PROVIDER: Cornett, Thomas, MD   PT End of Session - 06/23/22 1058     Visit Number 18   # unchanged due to screen only   PT Start Time 1056    PT Stop Time 1106    PT Time Calculation (min) 10 min    Activity Tolerance Patient tolerated treatment well    Behavior During Therapy WFL for tasks assessed/performed             Past Medical History:  Diagnosis Date   Anxiety    Complication of anesthesia    HA   Non Hodgkin's lymphoma ([facility])    Psoriasis    Past Surgical History:  Procedure Laterality Date   BREAST BIOPSY Left 11/20/2021   BREAST RECONSTRUCTION WITH PLACEMENT OF TISSUE EXPANDER AND ALLODERM Bilateral 01/07/2022   Procedure: BILATERAL BREAST RECONSTRUCTION WITH PLACEMENT OF TISSUE EXPANDERS AND ALLODERM;  Surgeon: Thimmappa, Brinda, MD;  Location: [facility];  Service: Plastics;  Laterality: Bilateral;   NIPPLE SPARING MASTECTOMY Right 01/07/2022   Procedure: RIGHT NIPPLE SPARING MASTECTOMY;  Surgeon: Cornett, Thomas, MD;  Location: [facility];  Service: General;  Laterality: Right;   NIPPLE SPARING MASTECTOMY WITH SENTINEL LYMPH NODE BIOPSY Left 01/07/2022   Procedure: LEFT NIPPLE SPARING MASTECTOMY WITH SENTINEL LYMPH NODE BIOPSY;  Surgeon: Cornett, Thomas, MD;  Location: [facility];  Service: General;  Laterality: Left;   TUBAL LIGATION     Patient Active Problem List   Diagnosis Date Noted   Hypokalemia 04/08/2022   Anemia of chronic disease 04/08/2022   Restless leg syndrome 04/08/2022   Obesity (BMI 30-39.9) 04/08/2022   Bronchiectasis with acute exacerbation (HCC) 04/07/2022   Neutropenic fever (HCC) 04/07/2022   Pulmonary nodules 04/07/2022   Anxiety    Hiatal hernia with GERD    Invasive lobular carcinoma of right  breast, stage 1 ([facility]) 01/24/2022   Genetic testing 12/11/2021   Family history of breast cancer 11/29/2021   Malignant neoplasm of upper-inner quadrant of left breast in female, estrogen receptor positive ([facility]) 11/26/2021    REFERRING DIAG: left breast cancer at risk for lymphedema  THERAPY DIAG: Aftercare following surgery for neoplasm  PERTINENT HISTORY: Patient was diagnosed on 09/10/2021 with 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 Two negative axillary nodes were removed on the left side. An incidental finding of right breast invasive lobular carcinoma was also found and is ER/PR positive and HER2 negative. It is weakly ER positive, PR negative and HER2 negative with a Ki67 of 20%. She has a left wrist plate from a previous surgery in 2006 and a history of non-Hodgkins Lymphoma in 1985.             PRECAUTIONS: left UE Lymphedema risk, None  SUBJECTIVE: Pt returns for her 3 month L-Dex screen.   PAIN:  Are you having pain? No  SOZO SCREENING: Patient was assessed today using the SOZO machine to determine the lymphedema index score. This was compared to her baseline score. It was determined that she is within the recommended range when compared to her baseline and no further action is needed at this time. She will continue SOZO screenings. These are done every 3 months for 2 years post operatively   followed by every 6 months for 2 years, and then annually.   L-DEX FLOWSHEETS - 06/23/22 1100       L-DEX LYMPHEDEMA SCREENING   Measurement Type Unilateral    L-DEX MEASUREMENT EXTREMITY Upper Extremity    POSITION  Standing    DOMINANT SIDE Right    At Risk Side Left    BASELINE SCORE (UNILATERAL) 6.4    L-DEX SCORE (UNILATERAL) 1.9    VALUE CHANGE (UNILAT) -4.5               Otelia Limes, PTA 06/23/2022, 11:08 AM

## 2022-06-25 ENCOUNTER — Other Ambulatory Visit: Payer: Self-pay

## 2022-06-25 ENCOUNTER — Ambulatory Visit: Payer: BC Managed Care – PPO

## 2022-06-25 DIAGNOSIS — C50212 Malignant neoplasm of upper-inner quadrant of left female breast: Secondary | ICD-10-CM

## 2022-06-25 DIAGNOSIS — Z483 Aftercare following surgery for neoplasm: Secondary | ICD-10-CM

## 2022-06-25 DIAGNOSIS — R293 Abnormal posture: Secondary | ICD-10-CM

## 2022-06-25 DIAGNOSIS — M6281 Muscle weakness (generalized): Secondary | ICD-10-CM

## 2022-06-25 DIAGNOSIS — M25612 Stiffness of left shoulder, not elsewhere classified: Secondary | ICD-10-CM | POA: Diagnosis present

## 2022-06-25 DIAGNOSIS — Z170 Estrogen receptor positive status [ER+]: Secondary | ICD-10-CM | POA: Diagnosis present

## 2022-06-25 NOTE — Therapy (Signed)
OUTPATIENT PHYSICAL THERAPY TREATMENT NOTE   Patient Name: Erika Hodge MRN: [id number] DOB:1966-02-16, 56 y.o., female Today's Date: 06/25/2022  PCP: Bonnita Nasuti, MD REFERRING PROVIDER: Erroll Luna, MD  END OF SESSION:   PT End of Session - 06/25/22 1600     Visit Number 19    Number of Visits 26    Date for PT Re-Evaluation 07/16/22    PT Start Time 0998    PT Stop Time 1653    PT Time Calculation (min) 49 min    Activity Tolerance Patient tolerated treatment well    Behavior During Therapy WFL for tasks assessed/performed              Past Medical History:  Diagnosis Date   Anxiety    Complication of anesthesia    HA   Non Hodgkin's lymphoma ([place])    Psoriasis    Past Surgical History:  Procedure Laterality Date   BREAST BIOPSY Left 11/20/2021   BREAST RECONSTRUCTION WITH PLACEMENT OF TISSUE EXPANDER AND ALLODERM Bilateral 01/07/2022   Procedure: BILATERAL BREAST RECONSTRUCTION WITH PLACEMENT OF TISSUE EXPANDERS AND ALLODERM;  Surgeon: Irene Limbo, MD;  Location: [place];  Service: Plastics;  Laterality: Bilateral;   NIPPLE SPARING MASTECTOMY Right 01/07/2022   Procedure: RIGHT NIPPLE SPARING MASTECTOMY;  Surgeon: Erroll Luna, MD;  Location: [place];  Service: General;  Laterality: Right;   NIPPLE SPARING MASTECTOMY WITH SENTINEL LYMPH NODE BIOPSY Left 01/07/2022   Procedure: LEFT NIPPLE SPARING MASTECTOMY WITH SENTINEL LYMPH NODE BIOPSY;  Surgeon: Erroll Luna, MD;  Location: [place];  Service: General;  Laterality: Left;   TUBAL LIGATION     Patient Active Problem List   Diagnosis Date Noted   Hypokalemia 04/08/2022   Anemia of chronic disease 04/08/2022   Restless leg syndrome 04/08/2022   Obesity (BMI 30-39.9) 04/08/2022   Bronchiectasis with acute exacerbation ([place]) 04/07/2022   Neutropenic fever ([place]) 04/07/2022   Pulmonary nodules 04/07/2022   Anxiety    Hiatal hernia with  GERD    Invasive lobular carcinoma of right breast, stage 1 ([place]) 01/24/2022   Genetic testing 12/11/2021   Family history of breast cancer 11/29/2021   Malignant neoplasm of upper-inner quadrant of left breast in female, estrogen receptor positive ([place]) 11/26/2021    REFERRING DIAG: s/p bilateral Mastectomies with left SLNB  THERAPY DIAG:  Aftercare following surgery for neoplasm  Stiffness of left shoulder, not elsewhere classified  Muscle weakness (generalized)  Abnormal posture  Malignant neoplasm of upper-inner quadrant of left breast in female, estrogen receptor positive ([place])  PERTINENT HISTORY: Patient was diagnosed on 09/10/2021 with 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 Two negative axillary nodes were removed on the left side. An incidental finding of right breast invasive lobular carcinoma was also found and is ER/PR positive and HER2 negative. It is weakly ER positive, PR negative and HER2 negative with a Ki67 of 20%. She has a left wrist plate from a previous surgery in 2006 and a history of non-Hodgkins Lymphoma in 1985.  PRECAUTIONS: left lymphedema risk, hx of Non-Hodgkins Lymphoma, left wrist plate from prior surgery  SUBJECTIVE: Very tight and sore at left lateral trunk and it feels swollen.  I put my compression bra on and it seems to help. I am  scheduled for surgery  for expander exhange on 07/15/2022.  I have blood work on  August 28.  Marland Kitchen PAIN:  Are you having pain? No  02/03/2022 OBSERVATIONS:            Right breast with increased edema compared to left. Left inferior breast incision appears to be slightly macerated. Non-stick pad applied under each breast to reduce fluid leakage. Left axillary incision appears to be healing well.    POSTURE:  Forward head, rounded shoulders   LYMPHEDEMA ASSESSMENT:    A/PROM Right 11/27/2021  Left 11/27/2021 Right 02/03/2022 Left  02/03/2022 Right 02/21/22 Left 02/21/22 Right 03/03/2022 Left 03/03/2022 Right 03/26/22 Left  03/26/22 Right  04/23/2022 Left 04/23/2022 L 06/04/22  Shoulder extension 38 48 39 32 61 54 51 58   60 52 66  Shoulder flexion 157 152 100 51 148 115 163 143  155 167 159 165  Shoulder abduction 165 163 60 58 169 120 179 133  165 180 179 150  Shoulder internal rotation 67 62 63 NT 54 55 68 64   68 66 82  Shoulder external rotation 77 77 84 NT 90 78 97 88   99 95 90                          (Blank rows = not tested)    LYMPHEDEMA ASSESSMENTS:    LANDMARK RIGHT 11/27/2021 LEFT 11/27/2021 RIGHT 02/03/2022 LEFT 02/03/2022  10 cm proximal to olecranon process 32 31.9 31.8 31.5  Olecranon process 27 27 27.7 26.4  10 cm proximal to ulnar styloid process 23.7 23 23.3 22.7  Just proximal to ulnar styloid process 16.4 16.8 16.1 16.7  Across hand at thumb web space 19 18.7 19 18.8  At base of 2nd digit 6.8 7.5 6.7 7.5  (Blank rows = not tested)   Treatment:  06/25/2022 Ball rolls up wall flexion x 10, abd x 5, Right and left standing SB stretch x 10, LTR stretch to the left x 5, clasped hands flexion x 3 Soft tissue mobilization to left lats and serratus in R S/Land to medial scapular border where pt palpably very tight and tender PROM left shoulder flexion and scaption while performing MFR to L axilla. MLD to short neck, right axillary and left inguinal LN's, anterior interaxillary anastamosis, left axillo inguinal pathway and in SL to posterior interaxillary pathway and left anterior axillo-inguinal pathway with emphasis at left lateral trunk area of mild swelling.  05/28/22: Therapeutic Exs Ball roll up wall into flexion x10 and then Lt UE abd x10 returning therapist demo with pt feeling fatigue by the end of this Forearms on wall for scapular retraction x10 each, then forearm walking up wall x5 with pt reporting increased fatigue with this Manual Therapy Soft tissue  mobilization to left lats and serratus in R S/Land to medial scapular border where pt palpably very tight PROM left shoulder abduction while performing MFR to area of cording in L axilla Scapular mobilization in to protraction and retraction in R S/L with pt very tight with limited mobility at first but this improved with manual therapy 05/28/22: Therapeutic Exs Ball roll up wall into flexion x10 and then Lt UE abd x10 returning therapist demo with pt feeling fatigue by the end of this Forearms on wall with yellow theraband for scapular retraction x10 each, then forearm walking up wall x5 with pt reporting increased fatigue with this Manual Therapy Soft tissue mobilization to left, pectorals and lats in supine, then in Rt S/L with cocoa butter for STM to medial scapular  border and UT where pt palpably very tight PROM left shoulder flexion, abd, and D2 to pts available end motions which did improve by end of session, pt required frequent cues to relax 05/14/22: Therapeutic Exs Ball roll up wall into flexion x10 and then Lt UE abd x5 returning therapist demo then reports UE fatigue so stopped Forearms on wall with yellow theraband for scapular retraction x10 each, seated after due to fatigue, then forearm walking up wall x5 each returning therapist demo for each.  Manual Therapy Soft tissue mobilization to left, pectorals and lats in supine, then in Rt S/L with cocoa butter for STm to medial scapular border and UT where pt palpably very tight and tender to touch at UT PROM left shoulder flexion, abd, and D2 to pts available end motions which did improve by end of session and she reported feeling less tightness after session  05/07/2022 Soft tissue mobilization to left UT, pectorals and lats in supine and UT and scapular area in right S/L.Marland Kitchen PROM left shoulder flexion, abd, ER. AROM Supine flexion, scaption and horizontal abduction x 5. Supine alphabet x 1, supine serratus punch x 15 Reviewed standing  theraband;Scapular retraction, shoulder extension, bilateral ER with yellow x 10, standing shoulder elevation x10. Also right SB stretch with left arm overhead  standing or right SL,and left pectoral stretch on wall. Pt was given illustrated instructions  04/23/2022 Left trunk observed for swelling but none noted today. Pt was tender in left lats and feels very muscular.  Assessed shoulder ROM with very good results bilaterally. Soft tissue mobilization to left UT, pectorals and lats in supine and UT and scapular area in right S/L.Marland Kitchen Instructed pt in standing theraband;Scapular retraction, shoulder extension, bilateral ER with yellow x 10, standing shoulder elevation x10. Also right SB stretch with left arm overhead  standing or right SL,and left pectoral stretch on wall. Pt was given illustrated instructions             Surgery type/Date: 01/07/2022 Number of lymph nodes removed: 2 on left Current/past treatment (chemo, radiation, hormone therapy): Radiation to chest for Non-hodgkins lymphoma in 1985 Other symptoms:  Heaviness/tightness Yes Pain Yes Pitting edema No Infections No Decreased scar mobility Yes Stemmer sign No     PATIENT EDUCATION:   Access Code: [facility] URL: [email or web address] Date: 05/07/2022 Prepared by: Cheral Almas  Exercises - Supine Shoulder Alphabet  - 1 x daily - 7 x weekly - 1 sets - 1 reps - Supine Single Arm Scapular Protraction  - 1 x daily - 7 x weekly - 1-2 sets - 10 reps   Access Code: [email or web address] URL: [email or web address] Date: 04/23/2022 Prepared by: Cheral Almas  Exercises - Single Arm Doorway Pec Stretch at 60 Elevation  - 1 x daily - 7 x weekly - 1 sets - 3 reps - 15 hold Also standing scapular retraction, shoulder extension and bilateral ER x 10, shoulder elevation x 10- Chest Stretch with Shoulder Squeeze  - 1 x daily - 7 x weekly - 5 reps - 5-10 hold  Person educated: Patient  Education method: Explanation,  demonstration, handout Education comprehension: verbalized, demonstrated understanding, and pt returned demonstration      HOME EXERCISE PROGRAM:  Reviewed previously given post op HEP. Performed supine clasped hands flexion and stargazer today with improved ease 4/14- issued supine dowel flexion and abduction , 03/05/2022 Lower trunk rotation with arms outstretched; 03/10/22: Self MLD to Lt lateral trunk, 03/26/22 scapular clock movements  04/16/2022 Supine scapular series except sword  x 5. Gave yellow band 04/23/2022 Standing scapular retraction, shoulder extension, bilateral ER with yellow, elevation with back against wall, SB stretch with arm overhead, and single arm pec stretch. ASSESSMENT:   CLINICAL IMPRESSION: Pt came in today with new complaint of swelling at the left lateral trunk. Performed soft tissue mobilization to tight areas and PROM for left shoulder with VC's to relax. Initiated MLD to left lateral trunk. Pts shoulder is mildly restricted and pt was advised to resume stretches at home.  Pt will benefit from skilled therapeutic intervention to improve on the following deficits: Decreased knowledge of precautions, impaired UE functional use, pain, decreased ROM, postural dysfunction.    PT treatment/interventions: ADL/Self care home management, Therapeutic exercises, Therapeutic activity, Neuromuscular re-education, Balance training, Gait training, Patient/Family education, and Joint mobilization         GOALS: Goals reviewed with patient? Yes   LONG TERM GOALS:  (STG=LTG)   GOALS Name Target Date Goal status  1 Pt will demonstrate she has regained full shoulder ROM and function post operatively compared to baselines.  Baseline: 03/31/2022 MET for ROM except left extension  2 Patient will increase bilateral shoulder flexion to be >/= 140 degrees for increased ease reaching overhead.   03/03/2022 MET 03/03/2022  3 Patient will increase bilateral shoulder abduction to be >/= 150  degrees for increased ability to obtain radiation positioning. 03/31/2022 MET  4 Patient will improve her DASH score to be </= 10 for improved overall UE function. 03/31/2022 Ongoing 41% today 18% on 03/26/22 22% 04/23/2022 18% 06/04/22  5 Pt will be able to perform sidelying abduction with proper scapular movement to limit impingement 04/23/22 Ongoing - still having pain with this as of 06/04/22        PLAN: PT FREQUENCY/DURATION: 1x/week for 6 weeks, avoiding weeks with chemo   PLAN FOR NEXT SESSION: contscapular movement and protraction/retraction work, cont forearms on wall activities as pt was challenged by this and can add to HEP when able; cont gentle PROM, STM to Lt  axillary and periscapular area; cont ball roll up wall, modified downward dog on wall  progress strength and use of UE's, Add reach to cabinet       Claris Pong, PT 06/25/2022, 5:05 PM

## 2022-06-28 ENCOUNTER — Other Ambulatory Visit: Payer: Self-pay | Admitting: Physician Assistant

## 2022-06-28 ENCOUNTER — Other Ambulatory Visit: Payer: Self-pay | Admitting: Hematology

## 2022-07-01 ENCOUNTER — Ambulatory Visit: Payer: BC Managed Care – PPO

## 2022-07-01 DIAGNOSIS — C50212 Malignant neoplasm of upper-inner quadrant of left female breast: Secondary | ICD-10-CM

## 2022-07-01 DIAGNOSIS — M6281 Muscle weakness (generalized): Secondary | ICD-10-CM

## 2022-07-01 DIAGNOSIS — M25612 Stiffness of left shoulder, not elsewhere classified: Secondary | ICD-10-CM

## 2022-07-01 DIAGNOSIS — R293 Abnormal posture: Secondary | ICD-10-CM

## 2022-07-01 DIAGNOSIS — Z483 Aftercare following surgery for neoplasm: Secondary | ICD-10-CM | POA: Diagnosis not present

## 2022-07-01 NOTE — Therapy (Signed)
OUTPATIENT PHYSICAL THERAPY TREATMENT NOTE   Patient Name: Erika Hodge MRN: [id number] DOB:May 06, 1966, 56 y.o., female Today's Date: 07/01/2022  PCP: Bonnita Nasuti, MD REFERRING PROVIDER: Erroll Luna, MD  END OF SESSION:   PT End of Session - 07/01/22 1259     Visit Number 19    Number of Visits 26    Date for PT Re-Evaluation 07/16/22    PT Start Time 1302    PT Stop Time 1356    PT Time Calculation (min) 54 min    Activity Tolerance Patient tolerated treatment well    Behavior During Therapy WFL for tasks assessed/performed              Past Medical History:  Diagnosis Date   Anxiety    Complication of anesthesia    HA   Non Hodgkin's lymphoma ([place])    Psoriasis    Past Surgical History:  Procedure Laterality Date   BREAST BIOPSY Left 11/20/2021   BREAST RECONSTRUCTION WITH PLACEMENT OF TISSUE EXPANDER AND ALLODERM Bilateral 01/07/2022   Procedure: BILATERAL BREAST RECONSTRUCTION WITH PLACEMENT OF TISSUE EXPANDERS AND ALLODERM;  Surgeon: Irene Limbo, MD;  Location: [place];  Service: Plastics;  Laterality: Bilateral;   NIPPLE SPARING MASTECTOMY Right 01/07/2022   Procedure: RIGHT NIPPLE SPARING MASTECTOMY;  Surgeon: Erroll Luna, MD;  Location: [place];  Service: General;  Laterality: Right;   NIPPLE SPARING MASTECTOMY WITH SENTINEL LYMPH NODE BIOPSY Left 01/07/2022   Procedure: LEFT NIPPLE SPARING MASTECTOMY WITH SENTINEL LYMPH NODE BIOPSY;  Surgeon: Erroll Luna, MD;  Location: [place];  Service: General;  Laterality: Left;   TUBAL LIGATION     Patient Active Problem List   Diagnosis Date Noted   Hypokalemia 04/08/2022   Anemia of chronic disease 04/08/2022   Restless leg syndrome 04/08/2022   Obesity (BMI 30-39.9) 04/08/2022   Bronchiectasis with acute exacerbation ([place]) 04/07/2022   Neutropenic fever ([place]) 04/07/2022   Pulmonary nodules 04/07/2022   Anxiety    Hiatal hernia with  GERD    Invasive lobular carcinoma of right breast, stage 1 ([place]) 01/24/2022   Genetic testing 12/11/2021   Family history of breast cancer 11/29/2021   Malignant neoplasm of upper-inner quadrant of left breast in female, estrogen receptor positive ([place]) 11/26/2021    REFERRING DIAG: s/p bilateral Mastectomies with left SLNB  THERAPY DIAG:  Aftercare following surgery for neoplasm  Stiffness of left shoulder, not elsewhere classified  Muscle weakness (generalized)  Abnormal posture  Malignant neoplasm of upper-inner quadrant of left breast in female, estrogen receptor positive ([place])  PERTINENT HISTORY: Patient was diagnosed on 09/10/2021 with 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 Two negative axillary nodes were removed on the left side. An incidental finding of right breast invasive lobular carcinoma was also found and is ER/PR positive and HER2 negative. It is weakly ER positive, PR negative and HER2 negative with a Ki67 of 20%. She has a left wrist plate from a previous surgery in 2006 and a history of non-Hodgkins Lymphoma in 1985.  PRECAUTIONS: left lymphedema risk, hx of Non-Hodgkins Lymphoma, left wrist plate from prior surgery  SUBJECTIVE: I felt really good when I left here.  I am still tender under my arm but swelling seems to be better   PAIN:  Are you having pain? No  02/03/2022 OBSERVATIONS:  Right breast with increased edema compared to left. Left inferior breast incision appears to be slightly macerated. Non-stick pad applied under each breast to reduce fluid leakage. Left axillary incision appears to be healing well.    POSTURE:  Forward head, rounded shoulders   LYMPHEDEMA ASSESSMENT:    A/PROM Right 11/27/2021 Left 11/27/2021 Right 02/03/2022 Left  02/03/2022 Right 02/21/22 Left 02/21/22 Right 03/03/2022 Left 03/03/2022  Right 03/26/22 Left  03/26/22 Right  04/23/2022 Left 04/23/2022 L 06/04/22  Shoulder extension 38 48 39 32 61 54 51 58   60 52 66  Shoulder flexion 157 152 100 51 148 115 163 143  155 167 159 165  Shoulder abduction 165 163 60 58 169 120 179 133  165 180 179 150  Shoulder internal rotation 67 62 63 NT 54 55 68 64   68 66 82  Shoulder external rotation 77 77 84 NT 90 78 97 88   99 95 90                          (Blank rows = not tested)    LYMPHEDEMA ASSESSMENTS:    LANDMARK RIGHT 11/27/2021 LEFT 11/27/2021 RIGHT 02/03/2022 LEFT 02/03/2022  10 cm proximal to olecranon process 32 31.9 31.8 31.5  Olecranon process 27 27 27.7 26.4  10 cm proximal to ulnar styloid process 23.7 23 23.3 22.7  Just proximal to ulnar styloid process 16.4 16.8 16.1 16.7  Across hand at thumb web space 19 18.7 19 18.8  At base of 2nd digit 6.8 7.5 6.7 7.5  (Blank rows = not tested)   Treatment: 07/01/2022 Progressed ROM and strength Ball rolls up wall flexion x 10, abd x 5, Right and left standing SB stretch x 5, standing triceps stretch x 3,  Standing lat stretch x 3, Elbow flexion and triceps kick back 3# x 10, jobes flexion and scaption 1 # x 10, Elbow flexion and extension with red band x 5, scapular retraction and shoulder extension red band x 10 Updated HEP with all pictures of exercises. Soft tissue mobilization to left pecs,lats and serratus in supine and R S/Land to medial scapular border where pt palpably very tight and tender PROM left shoulder flexion and scaption while performing MFR to L axilla 06/25/2022 Ball rolls up wall flexion x 10, abd x 5, Right and left standing SB stretch x 10, LTR stretch to the left x 5, clasped hands flexion x 3 Soft tissue mobilization to left lats and serratus in R S/Land to medial scapular border where pt palpably very tight and tender PROM left shoulder flexion and scaption while performing MFR to L axilla. MLD to short neck, right axillary and left inguinal LN's,  anterior interaxillary anastamosis, left axillo inguinal pathway and in SL to posterior interaxillary pathway and left anterior axillo-inguinal pathway with emphasis at left lateral trunk area of mild swelling.  05/28/22: Therapeutic Exs Ball roll up wall into flexion x10 and then Lt UE abd x10 returning therapist demo with pt feeling fatigue by the end of this Forearms on wall for scapular retraction x10 each, then forearm walking up wall x5 with pt reporting increased fatigue with this Manual Therapy Soft tissue mobilization to left lats and serratus in R S/Land to medial scapular border where pt palpably very tight PROM left shoulder abduction while performing MFR to area of cording in L axilla Scapular mobilization in to protraction and retraction in R S/L with pt very tight with  limited mobility at first but this improved with manual therapy 05/28/22: Therapeutic Exs Ball roll up wall into flexion x10 and then Lt UE abd x10 returning therapist demo with pt feeling fatigue by the end of this Forearms on wall with yellow theraband for scapular retraction x10 each, then forearm walking up wall x5 with pt reporting increased fatigue with this Manual Therapy Soft tissue mobilization to left, pectorals and lats in supine, then in Rt S/L with cocoa butter for STM to medial scapular border and UT where pt palpably very tight PROM left shoulder flexion, abd, and D2 to pts available end motions which did improve by end of session, pt required frequent cues to relax 05/14/22: Therapeutic Exs Ball roll up wall into flexion x10 and then Lt UE abd x5 returning therapist demo then reports UE fatigue so stopped Forearms on wall with yellow theraband for scapular retraction x10 each, seated after due to fatigue, then forearm walking up wall x5 each returning therapist demo for each.             Surgery type/Date: 01/07/2022 Number of lymph nodes removed: 2 on left Current/past treatment (chemo, radiation,  hormone therapy): Radiation to chest for Non-hodgkins lymphoma in 1985 Other symptoms:  Heaviness/tightness Yes Pain Yes Pitting edema No Infections No Decreased scar mobility Yes Stemmer sign No     PATIENT EDUCATION:  07/01/2022 SR, shoulder ext, bilateral ER wih red, elbow flex and ext with red, jobes flex and scaption 1#. Pt given illustrated and written instructions and able to return demonstrate  Access Code: [facility] URL: [email or web address] Date: 05/07/2022 Prepared by: Cheral Almas  Exercises - Supine Shoulder Alphabet  - 1 x daily - 7 x weekly - 1 sets - 1 reps - Supine Single Arm Scapular Protraction  - 1 x daily - 7 x weekly - 1-2 sets - 10 reps   Access Code: [email or web address] URL: [email or web address] Date: 04/23/2022 Prepared by: Cheral Almas  Exercises - Single Arm Doorway Pec Stretch at 60 Elevation  - 1 x daily - 7 x weekly - 1 sets - 3 reps - 15 hold Also standing scapular retraction, shoulder extension and bilateral ER x 10, shoulder elevation x 10- Chest Stretch with Shoulder Squeeze  - 1 x daily - 7 x weekly - 5 reps - 5-10 hold  Person educated: Patient  Education method: Explanation, demonstration, handout Education comprehension: verbalized, demonstrated understanding, and pt returned demonstration      HOME EXERCISE PROGRAM:  Reviewed previously given post op HEP. Performed supine clasped hands flexion and stargazer today with improved ease 4/14- issued supine dowel flexion and abduction , 03/05/2022 Lower trunk rotation with arms outstretched; 03/10/22: Self MLD to Lt lateral trunk, 03/26/22 scapular clock movements  04/16/2022 Supine scapular series except sword x 5. Gave yellow band 04/23/2022 Standing scapular retraction, shoulder extension, bilateral ER with yellow, elevation with back against wall, SB stretch with arm overhead, and single arm pec stretch. ASSESSMENT:   CLINICAL IMPRESSION: Pt felt good after last session.  Progressed stretches and strengthening for HEP and ended with soft tissue mobilization. Pt continues with tenderness inferior to left axilla. She required occasional VC's with exs but did very well overall.  Pt will benefit from skilled therapeutic intervention to improve on the following deficits: Decreased knowledge of precautions, impaired UE functional use, pain, decreased ROM, postural dysfunction.    PT treatment/interventions: ADL/Self care home management, Therapeutic exercises, Therapeutic activity, Neuromuscular re-education, Balance training, Gait training, Patient/Family education, and Joint mobilization  GOALS: Goals reviewed with patient? Yes   LONG TERM GOALS:  (STG=LTG)   GOALS Name Target Date Goal status  1 Pt will demonstrate she has regained full shoulder ROM and function post operatively compared to baselines.  Baseline: 03/31/2022 MET for ROM except left extension  2 Patient will increase bilateral shoulder flexion to be >/= 140 degrees for increased ease reaching overhead.   03/03/2022 MET 03/03/2022  3 Patient will increase bilateral shoulder abduction to be >/= 150 degrees for increased ability to obtain radiation positioning. 03/31/2022 MET  4 Patient will improve her DASH score to be </= 10 for improved overall UE function. 03/31/2022 Ongoing 41% today 18% on 03/26/22 22% 04/23/2022 18% 06/04/22  5 Pt will be able to perform sidelying abduction with proper scapular movement to limit impingement 04/23/22 Ongoing - still having pain with this as of 06/04/22        PLAN: PT FREQUENCY/DURATION: 1x/week for 6 weeks, avoiding weeks with chemo   PLAN FOR NEXT SESSION: contscapular movement and protraction/retraction work, ; cont gentle PROM, STM to Lt  axillary and periscapular area;  strength and flexibilityand use of UE's, Add reach to cabinet       Claris Pong, PT 07/01/2022, 1:58 PM

## 2022-07-03 ENCOUNTER — Other Ambulatory Visit: Payer: Self-pay

## 2022-07-03 ENCOUNTER — Ambulatory Visit: Payer: BC Managed Care – PPO

## 2022-07-04 ENCOUNTER — Encounter (HOSPITAL_BASED_OUTPATIENT_CLINIC_OR_DEPARTMENT_OTHER): Payer: Self-pay | Admitting: Plastic Surgery

## 2022-07-04 ENCOUNTER — Other Ambulatory Visit: Payer: Self-pay

## 2022-07-08 ENCOUNTER — Ambulatory Visit: Payer: BC Managed Care – PPO

## 2022-07-08 DIAGNOSIS — M25612 Stiffness of left shoulder, not elsewhere classified: Secondary | ICD-10-CM

## 2022-07-08 DIAGNOSIS — R293 Abnormal posture: Secondary | ICD-10-CM

## 2022-07-08 DIAGNOSIS — Z483 Aftercare following surgery for neoplasm: Secondary | ICD-10-CM | POA: Diagnosis not present

## 2022-07-08 DIAGNOSIS — Z170 Estrogen receptor positive status [ER+]: Secondary | ICD-10-CM

## 2022-07-08 DIAGNOSIS — M6281 Muscle weakness (generalized): Secondary | ICD-10-CM

## 2022-07-08 NOTE — Therapy (Signed)
OUTPATIENT PHYSICAL THERAPY TREATMENT NOTE   Patient Name: Erika Hodge MRN: [id number] DOB:February 10, 1966, 56 y.o., female Today's Date: 07/08/2022  PCP: Bonnita Nasuti, MD REFERRING PROVIDER: Erroll Luna, MD  END OF SESSION:   PT End of Session - 07/08/22 1258     Visit Number 20    Number of Visits 26    Date for PT Re-Evaluation 07/16/22    PT Start Time 1302    PT Stop Time 1353    PT Time Calculation (min) 51 min    Activity Tolerance Patient tolerated treatment well    Behavior During Therapy WFL for tasks assessed/performed              Past Medical History:  Diagnosis Date   Anxiety    Complication of anesthesia    HA   Dyspnea    due to chemo   GERD (gastroesophageal reflux disease)    Non Hodgkin's lymphoma ([place])    Psoriasis    Past Surgical History:  Procedure Laterality Date   BREAST BIOPSY Left 11/20/2021   BREAST RECONSTRUCTION WITH PLACEMENT OF TISSUE EXPANDER AND ALLODERM Bilateral 01/07/2022   Procedure: BILATERAL BREAST RECONSTRUCTION WITH PLACEMENT OF TISSUE EXPANDERS AND ALLODERM;  Surgeon: Irene Limbo, MD;  Location: [facility];  Service: Plastics;  Laterality: Bilateral;   NIPPLE SPARING MASTECTOMY Right 01/07/2022   Procedure: RIGHT NIPPLE SPARING MASTECTOMY;  Surgeon: Erroll Luna, MD;  Location: [facility];  Service: General;  Laterality: Right;   NIPPLE SPARING MASTECTOMY WITH SENTINEL LYMPH NODE BIOPSY Left 01/07/2022   Procedure: LEFT NIPPLE SPARING MASTECTOMY WITH SENTINEL LYMPH NODE BIOPSY;  Surgeon: Erroll Luna, MD;  Location: [facility];  Service: General;  Laterality: Left;   TUBAL LIGATION     Patient Active Problem List   Diagnosis Date Noted   Hypokalemia 04/08/2022   Anemia of chronic disease 04/08/2022   Restless leg syndrome 04/08/2022   Obesity (BMI 30-39.9) 04/08/2022   Bronchiectasis with acute exacerbation ([place]) 04/07/2022   Neutropenic fever ([place])  04/07/2022   Pulmonary nodules 04/07/2022   Anxiety    Hiatal hernia with GERD    Invasive lobular carcinoma of right breast, stage 1 ([place]) 01/24/2022   Genetic testing 12/11/2021   Family history of breast cancer 11/29/2021   Malignant neoplasm of upper-inner quadrant of left breast in female, estrogen receptor positive ([place]) 11/26/2021    REFERRING DIAG: s/p bilateral Mastectomies with left SLNB  THERAPY DIAG:  Aftercare following surgery for neoplasm  Stiffness of left shoulder, not elsewhere classified  Muscle weakness (generalized)  Abnormal posture  Malignant neoplasm of upper-inner quadrant of left breast in female, estrogen receptor positive ([place])  PERTINENT HISTORY: Patient was diagnosed on 09/10/2021 with 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 Two negative axillary nodes were removed on the left side. An incidental finding of right breast invasive lobular carcinoma was also found and is ER/PR positive and HER2 negative. It is weakly ER positive, PR negative and HER2 negative with a Ki67 of 20%. She has a left wrist plate from a previous surgery in 2006 and a history of non-Hodgkins Lymphoma in 1985.  PRECAUTIONS: left lymphedema risk, hx of Non-Hodgkins Lymphoma, left wrist plate from prior surgery  SUBJECTIVE: I can feel the plastic on the left expander but I see the Plastic Surgeon tomorrow.  My shoulder blade area feels sore and my UT  is really tight. I felt pretty good after last visit.   PAIN:  Are you having pain? No  02/03/2022 OBSERVATIONS:            Right breast with increased edema compared to left. Left inferior breast incision appears to be slightly macerated. Non-stick pad applied under each breast to reduce fluid leakage. Left axillary incision appears to be healing well.    POSTURE:  Forward head, rounded shoulders    LYMPHEDEMA ASSESSMENT:    A/PROM Right 11/27/2021 Left 11/27/2021 Right 02/03/2022 Left  02/03/2022 Right 02/21/22 Left 02/21/22 Right 03/03/2022 Left 03/03/2022 Right 03/26/22 Left  03/26/22 Right  04/23/2022 Left 04/23/2022 L 06/04/22  Shoulder extension 38 48 39 32 61 54 51 58   60 52 66  Shoulder flexion 157 152 100 51 148 115 163 143  155 167 159 165  Shoulder abduction 165 163 60 58 169 120 179 133  165 180 179 150  Shoulder internal rotation 67 62 63 NT 54 55 68 64   68 66 82  Shoulder external rotation 77 77 84 NT 90 78 97 88   99 95 90                          (Blank rows = not tested)    LYMPHEDEMA ASSESSMENTS:    LANDMARK RIGHT 11/27/2021 LEFT 11/27/2021 RIGHT 02/03/2022 LEFT 02/03/2022  10 cm proximal to olecranon process 32 31.9 31.8 31.5  Olecranon process 27 27 27.7 26.4  10 cm proximal to ulnar styloid process 23.7 23 23.3 22.7  Just proximal to ulnar styloid process 16.4 16.8 16.1 16.7  Across hand at thumb web space 19 18.7 19 18.8  At base of 2nd digit 6.8 7.5 6.7 7.5  (Blank rows = not tested)   Treatment: 07/08/2022  Ball rolls flex x 10, abd x 5 Scapular retraction, shoulder extension, and bilateral ER with red x 10, Elbow flexion and extension with red x 10, Jobes flexion  and scaption 2 # x 5, 1# x 5 Posterior shoulder stretch x 2 B, triceps stretch x 2, Sidebending stretch x 2 ea side Soft tissue mobilization to left UT,pecs,lats and serratus in supine and R S/Land to medial scapular border with cocoabutter  07/01/2022 Progressed ROM and strength Ball rolls up wall flexion x 10, abd x 5, Right and left standing SB stretch x 5, standing triceps stretch x 3,  Standing lat stretch x 3, Elbow flexion and triceps kick back 3# x 10, jobes flexion and scaption 1 # x 10, Elbow flexion and extension with red band x 5, scapular retraction and shoulder extension red band x 10 Updated HEP with all pictures of exercises. Soft tissue mobilization to left pecs,lats and  serratus in supine and R S/Land to medial scapular border where pt palpably very tight and tender PROM left shoulder flexion and scaption while performing MFR to L axilla 06/25/2022 Ball rolls up wall flexion x 10, abd x 5, Right and left standing SB stretch x 10, LTR stretch to the left x 5, clasped hands flexion x 3 Soft tissue mobilization to left lats and serratus in R S/Land to medial scapular border where pt palpably very tight and tender PROM left shoulder flexion and scaption while performing MFR to L axilla. MLD to short neck, right axillary and left inguinal LN's, anterior interaxillary anastamosis, left axillo inguinal pathway and in SL to posterior interaxillary pathway and left anterior axillo-inguinal pathway with  emphasis at left lateral trunk area of mild swelling.  05/28/22: Therapeutic Exs Ball roll up wall into flexion x10 and then Lt UE abd x10 returning therapist demo with pt feeling fatigue by the end of this Forearms on wall for scapular retraction x10 each, then forearm walking up wall x5 with pt reporting increased fatigue with this Manual Therapy Soft tissue mobilization to left lats and serratus in R S/Land to medial scapular border where pt palpably very tight PROM left shoulder abduction while performing MFR to area of cording in L axilla Scapular mobilization in to protraction and retraction in R S/L with pt very tight with limited mobility at first but this improved with manual therapy Surgery type/Date: 01/07/2022 Number of lymph nodes removed: 2 on left Current/past treatment (chemo, radiation, hormone therapy): Radiation to chest for Non-hodgkins lymphoma in 1985 Other symptoms:  Heaviness/tightness Yes Pain Yes Pitting edema No Infections No Decreased scar mobility Yes Stemmer sign No     PATIENT EDUCATION:  07/01/2022 SR, shoulder ext, bilateral ER wih red, elbow flex and ext with red, jobes flex and scaption 1#. Pt given illustrated and written  instructions and able to return demonstrate  Access Code: [facility] URL: [email or web address] Date: 05/07/2022 Prepared by: Cheral Almas  Exercises - Supine Shoulder Alphabet  - 1 x daily - 7 x weekly - 1 sets - 1 reps - Supine Single Arm Scapular Protraction  - 1 x daily - 7 x weekly - 1-2 sets - 10 reps   Access Code: [email or web address] URL: [email or web address] Date: 04/23/2022 Prepared by: Cheral Almas  Exercises - Single Arm Doorway Pec Stretch at 60 Elevation  - 1 x daily - 7 x weekly - 1 sets - 3 reps - 15 hold Also standing scapular retraction, shoulder extension and bilateral ER x 10, shoulder elevation x 10- Chest Stretch with Shoulder Squeeze  - 1 x daily - 7 x weekly - 5 reps - 5-10 hold  Person educated: Patient  Education method: Explanation, demonstration, handout Education comprehension: verbalized, demonstrated understanding, and pt returned demonstration      HOME EXERCISE PROGRAM:  Reviewed previously given post op HEP. Performed supine clasped hands flexion and stargazer today with improved ease 4/14- issued supine dowel flexion and abduction , 03/05/2022 Lower trunk rotation with arms outstretched; 03/10/22: Self MLD to Lt lateral trunk, 03/26/22 scapular clock movements  04/16/2022 Supine scapular series except sword x 5. Gave yellow band 04/23/2022 Standing scapular retraction, shoulder extension, bilateral ER with yellow, elevation with back against wall, SB stretch with arm overhead, and single arm pec stretch. ASSESSMENT:   CLINICAL IMPRESSION: Pt felt good after last session.  Continued stretches and strengthening and ended with soft tissue mobilization. Pt continues with tightness/tenderness in Left UT, Medial scapula and inferior to left axilla. She required occasional VC's with exs but did very well overall. She fatigues easily with Jobes exs. But felt good after rx  Pt will benefit from skilled therapeutic intervention to improve on the  following deficits: Decreased knowledge of precautions, impaired UE functional use, pain, decreased ROM, postural dysfunction.    PT treatment/interventions: ADL/Self care home management, Therapeutic exercises, Therapeutic activity, Neuromuscular re-education, Balance training, Gait training, Patient/Family education, and Joint mobilization         GOALS: Goals reviewed with patient? Yes   LONG TERM GOALS:  (STG=LTG)   GOALS Name Target Date Goal status  1 Pt will demonstrate she has regained full shoulder ROM and function post operatively compared to baselines.  Baseline:  03/31/2022 MET for ROM except left extension  2 Patient will increase bilateral shoulder flexion to be >/= 140 degrees for increased ease reaching overhead.   03/03/2022 MET 03/03/2022  3 Patient will increase bilateral shoulder abduction to be >/= 150 degrees for increased ability to obtain radiation positioning. 03/31/2022 MET  4 Patient will improve her DASH score to be </= 10 for improved overall UE function. 03/31/2022 Ongoing 41% today 18% on 03/26/22 22% 04/23/2022 18% 06/04/22  5 Pt will be able to perform sidelying abduction with proper scapular movement to limit impingement 04/23/22 Ongoing - still having pain with this as of 06/04/22        PLAN: PT FREQUENCY/DURATION: 1x/week for 6 weeks, avoiding weeks with chemo   PLAN FOR NEXT SESSION: contscapular movement and protraction/retraction work, ; cont gentle PROM, STM to Lt  axillary and periscapular area;  strength and flexibilityand use of UE's, Add reach to [place], PT 07/08/2022, 1:55 PM

## 2022-07-09 ENCOUNTER — Encounter: Payer: Self-pay | Admitting: Hematology

## 2022-07-09 ENCOUNTER — Inpatient Hospital Stay: Payer: BC Managed Care – PPO

## 2022-07-09 ENCOUNTER — Other Ambulatory Visit: Payer: Self-pay

## 2022-07-09 ENCOUNTER — Inpatient Hospital Stay: Payer: BC Managed Care – PPO | Attending: Hematology | Admitting: Hematology

## 2022-07-09 VITALS — BP 147/71 | HR 97 | Temp 98.1°F | Resp 18 | Ht 63.0 in | Wt 185.8 lb

## 2022-07-09 DIAGNOSIS — Z171 Estrogen receptor negative status [ER-]: Secondary | ICD-10-CM | POA: Insufficient documentation

## 2022-07-09 DIAGNOSIS — C50911 Malignant neoplasm of unspecified site of right female breast: Secondary | ICD-10-CM | POA: Diagnosis not present

## 2022-07-09 DIAGNOSIS — Z170 Estrogen receptor positive status [ER+]: Secondary | ICD-10-CM | POA: Insufficient documentation

## 2022-07-09 DIAGNOSIS — Z8572 Personal history of non-Hodgkin lymphomas: Secondary | ICD-10-CM | POA: Insufficient documentation

## 2022-07-09 DIAGNOSIS — C50212 Malignant neoplasm of upper-inner quadrant of left female breast: Secondary | ICD-10-CM | POA: Insufficient documentation

## 2022-07-09 DIAGNOSIS — C50919 Malignant neoplasm of unspecified site of unspecified female breast: Secondary | ICD-10-CM

## 2022-07-09 LAB — CBC WITH DIFFERENTIAL (CANCER CENTER ONLY)
Abs Immature Granulocytes: 0.01 10*3/uL (ref 0.00–0.07)
Basophils Absolute: 0.1 10*3/uL (ref 0.0–0.1)
Basophils Relative: 2 %
Eosinophils Absolute: 0.5 10*3/uL (ref 0.0–0.5)
Eosinophils Relative: 9 %
HCT: 36.3 % (ref 36.0–46.0)
Hemoglobin: 11.4 g/dL — ABNORMAL LOW (ref 12.0–15.0)
Immature Granulocytes: 0 %
Lymphocytes Relative: 23 %
Lymphs Abs: 1.3 10*3/uL (ref 0.7–4.0)
MCH: 29 pg (ref 26.0–34.0)
MCHC: 31.4 g/dL (ref 30.0–36.0)
MCV: 92.4 fL (ref 80.0–100.0)
Monocytes Absolute: 0.9 10*3/uL (ref 0.1–1.0)
Monocytes Relative: 16 %
Neutro Abs: 2.9 10*3/uL (ref 1.7–7.7)
Neutrophils Relative %: 50 %
Platelet Count: 405 10*3/uL — ABNORMAL HIGH (ref 150–400)
RBC: 3.93 MIL/uL (ref 3.87–5.11)
RDW: 17.2 % — ABNORMAL HIGH (ref 11.5–15.5)
WBC Count: 5.8 10*3/uL (ref 4.0–10.5)
nRBC: 0 % (ref 0.0–0.2)

## 2022-07-09 LAB — CMP (CANCER CENTER ONLY)
ALT: 17 U/L (ref 0–44)
AST: 22 U/L (ref 15–41)
Albumin: 4.4 g/dL (ref 3.5–5.0)
Alkaline Phosphatase: 59 U/L (ref 38–126)
Anion gap: 4 — ABNORMAL LOW (ref 5–15)
BUN: 16 mg/dL (ref 6–20)
CO2: 30 mmol/L (ref 22–32)
Calcium: 9.9 mg/dL (ref 8.9–10.3)
Chloride: 105 mmol/L (ref 98–111)
Creatinine: 0.77 mg/dL (ref 0.44–1.00)
GFR, Estimated: >60 mL/min (ref >60)
Glucose, Bld: 98 mg/dL (ref 70–99)
Potassium: 4.4 mmol/L (ref 3.5–5.1)
Sodium: 139 mmol/L (ref 135–145)
Total Bilirubin: 0.3 mg/dL (ref 0.3–1.2)
Total Protein: 7.1 g/dL (ref 6.5–8.1)

## 2022-07-09 MED ORDER — ROPINIROLE HCL 1 MG PO TABS: 1.0000 mg | ORAL_TABLET | Freq: Every day | ORAL | 0 refills | Status: DC

## 2022-07-09 NOTE — H&P (Signed)
Subjective:     Patient ID: Erika Hodge is a 56 y.o. female.   Follow-up       6 months postop. Completed adjuvant chemotherapy 7.28.23. Course complicated by neutropenia sepsis after cycle 3 hospital admission. Hb 11.4, plt 404 8.30.23   Presented following screening MMG with left breast abnormality. Diagnostic MMG/US showed mass in the left breast at 11 o'clock, 2 cmfn, 1.2 x 0.9 x 1.1 cm. Axilla normal. Biopsy showed IDC with DCIS, ER+ weak, PR-, Her2-.   PMH significant for NHL age 56 and received chest RT. PMH also significant for psoriasis previously on Tremfaya. Not candidate for immunotherapy for cancer breast if she resumes this.   Given above patient is not candidate for adjuvant RT, mastectomy recommended. Patient elected for bilateral.    Final pathology LEFT breast 2.1 cm IDC with DCIS, margins clear, 0/2 SLN. Over RIGHT breast 1.1 cm ILC with invasive carcinoma focally extending to anterior margin, ER/PR- Her2-.    Mother with metastatic breast ca. Genetics negative.   Prior 60 B happy with this. Right mastectomy 267 g Left mastectomy 338 g   Stopped vaping with nicotine at time of diagnosis. Lives with spouse and youngest son age 28. 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    Review of Systems  Remainder 12 point review negative    Objective:   Physical Exam Cardiovascular:     Rate and Rhythm: Normal rate and regular rhythm.     Heart sounds: Normal heart sounds.  Pulmonary:     Effort: Pulmonary effort is normal.     Breath sounds: Normal breath sounds.     Chest:  Bilateral chest expanded  SN to nipple R 21 L 20 cm Nipple to IMF R 6.5 L 6 cm Midline to nipple R 10 L 10 cm       Assessment:     Hx NHL s/p mantle RT Left breast ca UIQ ER+ S/p bilateral NSM, Left SLN, prepectoral TE/ADM (Alloderm) reconstruction Right breast ILC Adjuvant chemotherapy    Plan:     CT angio chest completed 5/20233. This was negative but 3 small lung nodules noted  LLL. Follow-up chest CT with contrast in 3 months recommended, this is scheduled for 9.14.23   Plan removal bilateral tissue expanders and placement silicone implants.    Reviewed saline vs silicone, smooth v textured, shaped v round.  As in prepectoral position I do recommend HCG or capacity filled silicone implants to reduce risk visible rippling.  Reviewed MRI or [place] surveillance for rupture with silicone implants. Reviewed examples for 4th generation, capacity filled 4th generation, and HCG implants vs saline implants. Reviewed risks AP flipping that may be more noticeable with 5th generation implants, may require surgery to correct. Discussed risk ALCL with textured devices. Reviewed implants are not permanent devices. Reviewed size guided in part by CW. Patient desires larger that present in her TE and her native breasts. Cannot assure cup size. Patient has elected for silicone. Reviewed example of this. Plan smooth round.    Completed [facility] physician patient checklist.   Discussed purpose fat grafting to thicken flaps, reduce visible rippling. Reviewed variable take graft, may need to repeat, fat necrosis that presents as masses, abdominal incisions, pain need for compression. Declines this. Counseled if indicated/desired can always do this in future.    Additional risks including seroma infection hematoma bleeding damage to adjacent structures need for additional procedures blood clots in legs or lungs reviewed.   Rx for oxycodone  robaxin and Bactrim given.   Natrelle 133S FV-12-T 400 ml tissue expanders placed bilateral. fill volume 325 ml saline bilateral.

## 2022-07-09 NOTE — Progress Notes (Signed)
[place]   [phone] [phone]   Clinic Follow up Note   Patient Care Team: Bonnita Nasuti, MD as PCP - General (Internal Medicine) Erroll Luna, MD as Consulting Physician (General Surgery) Truitt Merle, MD as Consulting Physician (Hematology) Gery Pray, MD as Consulting Physician (Radiation Oncology) Mauro Kaufmann, RN as Oncology Nurse Navigator Rockwell Germany, RN as Oncology Nurse Navigator  Date of Service:  07/09/2022  CHIEF COMPLAINT: f/u of bilateral breast cancers  CURRENT THERAPY:  Surveillance -Pending tamoxifen  ASSESSMENT & PLAN:  Erika Hodge is a 56 y.o. post-menopausal female with   1. 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 -she opted to proceed with b/l mastectomies with reconstruction on 01/07/22 with Dr. Brantley Stage and Dr. Iran Planas. Pathology revealed b/l breast cancers. Left breast showed 2.1 cm invasive and in situ ductal carcinoma. Margins and lymph nodes negative (0/2). Repeated ER/PR/HER2 were all negative (ER 10% weak (+) on biopsy) -she developed left IMF necrotic tissue, excised by Dr. Iran Planas on 01/27/22. 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  -we reviewed that adjuvant radiation is not recommended due to her previous radiation for lymphoma. -labs reviewed, improving off treatment.   2. Invasive lobular carcinoma of right breast, stage I pT1c, ER weakly+/PR+/Her2-, and DCIS  -incidental finding on b/l mastectomies on 01/07/22 for left breast cancer. Pathology from right breast showed: 1.1 cm invasive lobular carcinoma, grade 1, in LIQ; 1 cm DCIS, intermediate grade, in UIQ. ILC showed ER 30% weakly positive, PR 80% moderately  positive, Her2 negative. ILC focally involves adjacent anterior margin.  -we again discuss antiestrogen therapy today. Given her slow recovery from chemo and persistent symptoms s/p chemo, we will wait a few more months to start tamoxifen.  3. SOB, RLS, RLE edema, Back tenderness -secondary to chemo? -she is on inhaler per Dr. Shearon Stalls -she reports her RLS has worsened since finishing treatment. I previously called in requip and will increase her dose. -will monitor her other symptoms, phone visit in 6 weeks.   4. 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 -per pt, she also received three years of systemic treatment. She reports she was treated at [facility]. Her records were too old to be received, but she likely received adriamycin during that time.   5. Psoriasis  -previously on Tremfya injections, was told by dermatology that she could not restart due to h/o cancer. -I do not recommend immunotherapy because of this. -followed by Dr. Abner Greenspan with [facility], per pt     PLAN:  -I called in 1 mg requip for her restless leg syndrome  -phone visit in 6 weeks to review tamoxifen starting time, plan to start at 56m daily    No problem-specific Assessment & Plan notes found for this encounter.   SUMMARY OF ONCOLOGIC HISTORY: Oncology History Overview Note   Cancer Staging  Invasive lobular carcinoma of right breast, stage 1 ([place] Staging form: Breast, AJCC 8th Edition - Clinical stage from 01/07/2022: Stage Unknown (cT1c, cNX, cM0, G1, ER+, PR+, HER2-) - Signed by FTruitt Merle MD on 01/24/2022  Malignant neoplasm of upper-inner quadrant of left breast in female, estrogen receptor positive ([place] Staging form:  Breast, AJCC 8th Edition - Clinical stage from 11/20/2021: Stage IA (cT1c, cN0, cM0, G2, ER+, PR-, HER2-) - Signed by Truitt Merle, MD on 11/26/2021 - Pathologic stage from 01/07/2022: Stage IIA (pT2, pN0, cM0, G2, ER-, PR-, HER2-)  - Signed by Truitt Merle, MD on 01/24/2022     Malignant neoplasm of upper-inner quadrant of left breast in female, estrogen receptor positive ([place])  11/05/2021 Mammogram   CLINICAL DATA:  Screening recall for a possible left breast asymmetry.   EXAM: DIGITAL DIAGNOSTIC UNILATERAL LEFT MAMMOGRAM WITH TOMOSYNTHESIS AND CAD; ULTRASOUND LEFT BREAST LIMITED  IMPRESSION: 1. Irregular 1.2 cm mass in the left breast at 11 o'clock, 2 cm the nipple, suspicious for malignancy.   11/20/2021 Cancer Staging   Staging form: Breast, AJCC 8th Edition - Clinical stage from 11/20/2021: Stage IA (cT1c, cN0, cM0, G2, ER+, PR-, HER2-) - Signed by Truitt Merle, MD on 11/26/2021 Stage prefix: Initial diagnosis Histologic grading system: 3 grade system   11/20/2021 Initial Biopsy   Diagnosis Breast, left, needle core biopsy, 11 o'clock, ribbon clip - INVASIVE DUCTAL CARCINOMA - DUCTAL CARCINOMA IN SITU - SEE COMMENT Microscopic Comment based on the biopsy, the carcinoma appears Nottingham grade 2 of 3 and measures 0.6 cm in greatest linear extent.  PROGNOSTIC INDICATORS Results: The tumor cells are NEGATIVE for Her2 (1+). Estrogen Receptor: 10%, POSITIVE, WEAK STAINING INTENSITY Progesterone Receptor: 0%, NEGATIVE Proliferation Marker Ki67: 20%   11/26/2021 Initial Diagnosis   Malignant neoplasm of upper-inner quadrant of left breast in female, estrogen receptor positive ([place])   11/27/2021 Genetic Testing   [employer] Cancer[facility] was Negative. Report date is 12/11/2021.  The CancerNext gene panel offered by [employer] includes sequencing, rearrangement analysis, and RNA analysis for the following 36 genes:   APC, ATM, AXIN2, BARD1, BMPR1A, BRCA1, BRCA2, BRIP1, CDH1, CDK4, CDKN2A, CHEK2, DICER1, HOXB13, EPCAM, GREM1, MLH1, MSH2, MSH3, MSH6, MUTYH, NBN, NF1, NTHL1, PALB2, PMS2, POLD1, POLE, PTEN, RAD51C, RAD51D, RECQL, SMAD4, SMARCA4, STK11, and TP53.    01/07/2022 Cancer Staging   Staging form: Breast, AJCC 8th  Edition - Pathologic stage from 01/07/2022: Stage IIA (pT2, pN0, cM0, G2, ER-, PR-, HER2-) - Signed by Truitt Merle, MD on 01/24/2022 Stage prefix: Initial diagnosis Histologic grading system: 3 grade system Residual tumor (R): R0 - None   01/07/2022 Definitive Surgery   FINAL MICROSCOPIC DIAGNOSIS:   A. BREAST, RIGHT NIPPLE, BIOPSY:  - Benign breast tissue.  - No malignancy identified.   B. BREAST, LEFT NIPPLE, BIOPSY:  - Benign breast tissue.  - No malignancy identified.   C. BREAST, RIGHT, MASTECTOMY:  - Invasive lobular carcinoma, 1.1 cm.  - Ductal carcinoma in situ, 1 cm.  - Invasive carcinoma focally extends to the anterior margin.  - Fibrocystic changes with usual ductal hyperplasia and calcifications.  - Sclerosing adenosis and fibroadenomatoid change.  - See oncology table and comment.   D. LYMPH NODE, LEFT AXILLARY, SENTINEL, EXCISION:  - One lymph node negative for metastatic carcinoma (0/1).   E. LYMPH NODE, LEFT AXILLARY, SENTINEL, EXCISION:  - One lymph node negative for metastatic carcinoma (0/1).   F. BREAST, LEFT, MASTECTOMY:  - Invasive and in situ ductal carcinoma, 2.1 cm.  - Margins negative for carcinoma.  - See oncology table.    01/07/2022 Receptors her2   F1. PROGNOSTIC INDICATOR RESULTS:   The tumor cells are NEGATIVE for Her2 (0).   Estrogen Receptor: NEGATIVE  Progesterone Receptor: NEGATIVE  Proliferation Marker Ki-67: 10%    02/12/2022 - 06/06/2022 Chemotherapy  Patient is on Treatment Plan : BREAST TC q21d     Invasive lobular carcinoma of right breast, stage 1 ([place])  11/27/2021 Genetic Testing   [employer] [facility] was Negative. Report date is 12/11/2021.  The CancerNext gene panel offered by [employer] includes sequencing, rearrangement analysis, and RNA analysis for the following 36 genes:   APC, ATM, AXIN2, BARD1, BMPR1A, BRCA1, BRCA2, BRIP1, CDH1, CDK4, CDKN2A, CHEK2, DICER1, HOXB13, EPCAM, GREM1, MLH1, MSH2, MSH3, MSH6, MUTYH, NBN, NF1,  NTHL1, PALB2, PMS2, POLD1, POLE, PTEN, RAD51C, RAD51D, RECQL, SMAD4, SMARCA4, STK11, and TP53.    01/07/2022 Cancer Staging   Staging form: Breast, AJCC 8th Edition - Clinical stage from 01/07/2022: Stage Unknown (cT1c, cNX, cM0, G1, ER+, PR+, HER2-) - Signed by Truitt Merle, MD on 01/24/2022 Stage prefix: Initial diagnosis Histologic grading system: 3 grade system   01/07/2022 Definitive Surgery   FINAL MICROSCOPIC DIAGNOSIS:   A. BREAST, RIGHT NIPPLE, BIOPSY:  - Benign breast tissue.  - No malignancy identified.   B. BREAST, LEFT NIPPLE, BIOPSY:  - Benign breast tissue.  - No malignancy identified.   C. BREAST, RIGHT, MASTECTOMY:  - Invasive lobular carcinoma, 1.1 cm.  - Ductal carcinoma in situ, 1 cm.  - Invasive carcinoma focally extends to the anterior margin.  - Fibrocystic changes with usual ductal hyperplasia and calcifications.  - Sclerosing adenosis and fibroadenomatoid change.  - See oncology table and comment.   D. LYMPH NODE, LEFT AXILLARY, SENTINEL, EXCISION:  - One lymph node negative for metastatic carcinoma (0/1).   E. LYMPH NODE, LEFT AXILLARY, SENTINEL, EXCISION:  - One lymph node negative for metastatic carcinoma (0/1).   F. BREAST, LEFT, MASTECTOMY:  - Invasive and in situ ductal carcinoma, 2.1 cm.  - Margins negative for carcinoma.  - See oncology table.    01/07/2022 Receptors her2   C7.  PROGNOSTIC INDICATOR RESULTS:   The tumor cells are NEGATIVE for Her2 (1+).   Estrogen Receptor: POSITIVE, 30% WEAK STAINING INTENSITY  Progesterone Receptor: POSITIVE, 80% MODERATE STAINING INTENSITY  Proliferation Marker Ki-67: <5%    01/24/2022 Initial Diagnosis   Invasive lobular carcinoma of right breast, stage 1 ([place])      INTERVAL HISTORY:  Erika Hodge is here for a follow up of breast cancer. She was last seen by me on 06/06/22. She presents to the clinic accompanied by her husband. She reports she is still having SOB, worsening RLS, and RLE edema.    All other systems were reviewed with the patient and are negative.  MEDICAL HISTORY:  Past Medical History:  Diagnosis Date   Anxiety    Complication of anesthesia    HA   Dyspnea    due to chemo   GERD (gastroesophageal reflux disease)    Non Hodgkin's lymphoma ([place])    Psoriasis     SURGICAL HISTORY: Past Surgical History:  Procedure Laterality Date   BREAST BIOPSY Left 11/20/2021   BREAST RECONSTRUCTION WITH PLACEMENT OF TISSUE EXPANDER AND ALLODERM Bilateral 01/07/2022   Procedure: BILATERAL BREAST RECONSTRUCTION WITH PLACEMENT OF TISSUE EXPANDERS AND ALLODERM;  Surgeon: Irene Limbo, MD;  Location: [facility] [place];  Service: Plastics;  Laterality: Bilateral;   NIPPLE SPARING MASTECTOMY Right 01/07/2022   Procedure: RIGHT NIPPLE SPARING MASTECTOMY;  Surgeon: Erroll Luna, MD;  Location: [facility];  Service: General;  Laterality: Right;   NIPPLE SPARING MASTECTOMY WITH SENTINEL LYMPH NODE BIOPSY Left 01/07/2022   Procedure: LEFT NIPPLE SPARING MASTECTOMY WITH SENTINEL LYMPH NODE BIOPSY;  Surgeon:  Erroll Luna, MD;  Location: [facility];  Service: General;  Laterality: Left;   TUBAL LIGATION      I have reviewed the social history and family history with the patient and they are unchanged from previous note.  ALLERGIES:  is allergic to amoxicillin, ibuprofen, ciprofloxacin, and codeine.  MEDICATIONS:  Current Outpatient Medications  Medication Sig Dispense Refill   rOPINIRole (REQUIP) 1 MG tablet Take 1 tablet (1 mg total) by mouth at bedtime. 30 tablet 0   acetaminophen (TYLENOL) 500 MG tablet Take 1,000 mg by mouth every 6 (six) hours as needed for mild pain or fever.     albuterol (PROVENTIL) (2.5 MG/3ML) 0.083% nebulizer solution Take 3 mLs (2.5 mg total) by nebulization every 6 (six) hours as needed for wheezing or shortness of breath. 75 mL 6   ALPRAZolam (XANAX) 0.5 MG tablet Take 0.5 mg by mouth daily as needed for  anxiety or sleep.     ASHWAGANDHA PO Take 1 tablet by mouth daily.     FLUoxetine (PROZAC) 20 MG capsule Take 20 mg by mouth daily.     loratadine (CLARITIN) 10 MG tablet Take 10 mg by mouth daily.     omeprazole (PRILOSEC) 40 MG capsule Take 40 mg by mouth daily.     VENTOLIN HFA 108 (90 Base) MCG/ACT inhaler TAKE 2 PUFFS BY MOUTH EVERY 6 HOURS AS NEEDED FOR WHEEZE OR SHORTNESS OF BREATH 18 each 2   zolpidem (AMBIEN) 10 MG tablet Take 10 mg by mouth at bedtime as needed for sleep.     No current facility-administered medications for this visit.    PHYSICAL EXAMINATION: ECOG PERFORMANCE STATUS: 2 - Symptomatic, <50% confined to bed  Vitals:   07/09/22 1327  BP: (!) 147/71  Pulse: 97  Resp: 18  Temp: 98.1 F (36.7 C)  SpO2: 99%   Wt Readings from Last 3 Encounters:  07/09/22 185 lb 12.8 oz (84.3 kg)  06/23/22 184 lb 6 oz (83.6 kg)  06/06/22 181 lb 2 oz (82.2 kg)     GENERAL:alert, no distress and comfortable SKIN: skin color, texture, turgor are normal, no rashes or significant lesions EYES: normal, Conjunctiva are pink and non-injected, sclera clear  LYMPH:  no palpable lymphadenopathy in the cervical, axillary ABDOMEN: soft, (+) tender to right side Musculoskeletal:no cyanosis of digits and no clubbing, (+) tenderness to thoracic spine NEURO: alert & oriented x 3 with fluent speech, no focal motor/sensory deficits  LABORATORY DATA:  I have reviewed the data as listed    Latest Ref Rng & Units 07/09/2022    1:06 PM 06/06/2022   11:42 AM 05/16/2022    1:13 PM  CBC  WBC 4.0 - 10.5 K/uL 5.8  5.2  7.6   Hemoglobin 12.0 - 15.0 g/dL 11.4  9.6  9.7   Hematocrit 36.0 - 46.0 % 36.3  30.3  31.0   Platelets 150 - 400 K/uL 405  598  737         Latest Ref Rng & Units 07/09/2022    1:06 PM 06/06/2022   11:42 AM 05/16/2022    1:13 PM  CMP  Glucose 70 - 99 mg/dL 98  92  105   BUN 6 - 20 mg/dL _0 Creatinine 0.44 - 1.00 mg/dL 0.77  0.76  0.69   Sodium 135 - 145 mmol/L  139  138  138   Potassium 3.5 - 5.1 mmol/L 4.4  4.2  3.8  Chloride 98 - 111 mmol/L 105  105  104   CO2 22 - 32 mmol/L _0 Calcium 8.9 - 10.3 mg/dL 9.9  9.3  10.0   Total Protein 6.5 - 8.1 g/dL 7.1  7.1  7.6   Total Bilirubin 0.3 - 1.2 mg/dL 0.3  0.3  0.2   Alkaline Phos 38 - 126 U/L 59  79  87   AST 15 - 41 U/L _1 ALT 0 - 44 U/L _2 RADIOGRAPHIC STUDIES: I have personally reviewed the radiological images as listed and agreed with the findings in the report. No results found.    No orders of the defined types were placed in this encounter.  All questions were answered. The patient knows to call the clinic with any problems, questions or concerns. No barriers to learning was detected. The total time spent in the appointment was 30 minutes.     Truitt Merle, MD 07/09/2022   I, Wilburn Mylar, am acting as scribe for Truitt Merle, MD.   I have reviewed the above documentation for accuracy and completeness, and I agree with the above.

## 2022-07-10 ENCOUNTER — Ambulatory Visit: Payer: BC Managed Care – PPO

## 2022-07-10 ENCOUNTER — Telehealth: Payer: Self-pay | Admitting: Hematology

## 2022-07-10 DIAGNOSIS — Z483 Aftercare following surgery for neoplasm: Secondary | ICD-10-CM

## 2022-07-10 DIAGNOSIS — Z170 Estrogen receptor positive status [ER+]: Secondary | ICD-10-CM

## 2022-07-10 DIAGNOSIS — M6281 Muscle weakness (generalized): Secondary | ICD-10-CM

## 2022-07-10 DIAGNOSIS — M25612 Stiffness of left shoulder, not elsewhere classified: Secondary | ICD-10-CM

## 2022-07-10 DIAGNOSIS — R293 Abnormal posture: Secondary | ICD-10-CM

## 2022-07-10 NOTE — Therapy (Signed)
OUTPATIENT PHYSICAL THERAPY TREATMENT NOTE   Patient Name: Erika Hodge MRN: [id number] DOB:[birth year], 56 y.o., female Today's Date: 07/10/2022  PCP: Bonnita Nasuti, MD REFERRING PROVIDER: Erroll Luna, MD  END OF SESSION:   PT End of Session - 07/10/22 1259     Visit Number 21    Number of Visits 26    Date for PT Re-Evaluation 07/16/22    PT Start Time 1962    PT Stop Time 2297    PT Time Calculation (min) 53 min    Activity Tolerance Patient tolerated treatment well    Behavior During Therapy WFL for tasks assessed/performed              Past Medical History:  Diagnosis Date   Anxiety    Complication of anesthesia    HA   Dyspnea    due to chemo   GERD (gastroesophageal reflux disease)    Non Hodgkin's lymphoma ([place])    Psoriasis    Past Surgical History:  Procedure Laterality Date   BREAST BIOPSY Left 11/20/2021   BREAST RECONSTRUCTION WITH PLACEMENT OF TISSUE EXPANDER AND ALLODERM Bilateral 01/07/2022   Procedure: BILATERAL BREAST RECONSTRUCTION WITH PLACEMENT OF TISSUE EXPANDERS AND ALLODERM;  Surgeon: Irene Limbo, MD;  Location: [place];  Service: Plastics;  Laterality: Bilateral;   NIPPLE SPARING MASTECTOMY Right 01/07/2022   Procedure: RIGHT NIPPLE SPARING MASTECTOMY;  Surgeon: Erroll Luna, MD;  Location: [place];  Service: General;  Laterality: Right;   NIPPLE SPARING MASTECTOMY WITH SENTINEL LYMPH NODE BIOPSY Left 01/07/2022   Procedure: LEFT NIPPLE SPARING MASTECTOMY WITH SENTINEL LYMPH NODE BIOPSY;  Surgeon: Erroll Luna, MD;  Location: [place];  Service: General;  Laterality: Left;   TUBAL LIGATION     Patient Active Problem List   Diagnosis Date Noted   Hypokalemia 04/08/2022   Anemia of chronic disease 04/08/2022   Restless leg syndrome 04/08/2022   Obesity (BMI 30-39.9) 04/08/2022   Bronchiectasis with acute exacerbation ([place]) 04/07/2022   Neutropenic fever ([place])  04/07/2022   Pulmonary nodules 04/07/2022   Anxiety    Hiatal hernia with GERD    Invasive lobular carcinoma of right breast, stage 1 ([place]) 01/24/2022   Genetic testing 12/11/2021   Family history of breast cancer 11/29/2021   Malignant neoplasm of upper-inner quadrant of left breast in female, estrogen receptor positive ([place]) 11/26/2021    REFERRING DIAG: s/p bilateral Mastectomies with left SLNB  THERAPY DIAG:  Aftercare following surgery for neoplasm  Stiffness of left shoulder, not elsewhere classified  Muscle weakness (generalized)  Abnormal posture  Malignant neoplasm of upper-inner quadrant of left breast in female, estrogen receptor positive ([place])  PERTINENT HISTORY: Patient was diagnosed on 09/10/2021 with 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 Two negative axillary nodes were removed on the left side. An incidental finding of right breast invasive lobular carcinoma was also found and is ER/PR positive and HER2 negative. It is weakly ER positive, PR negative and HER2 negative with a Ki67 of 20%. She has a left wrist plate from a previous surgery in 2006 and a history of non-Hodgkins Lymphoma in 1985.  PRECAUTIONS: left lymphedema risk, hx of Non-Hodgkins Lymphoma, left wrist plate from prior surgery  SUBJECTIVE:  I have the implant exchange on 07/15/2022. I am excited and nervous too.  PAIN:  Are you having pain? No  02/03/2022 OBSERVATIONS:  Right breast with increased edema compared to left. Left inferior breast incision appears to be slightly macerated. Non-stick pad applied under each breast to reduce fluid leakage. Left axillary incision appears to be healing well.    POSTURE:  Forward head, rounded shoulders   LYMPHEDEMA ASSESSMENT:    A/PROM Right 11/27/2021 Left 11/27/2021 Right 02/03/2022 Left  02/03/2022 Right 02/21/22  Left 02/21/22 Right 03/03/2022 Left 03/03/2022 Right 03/26/22 Left  03/26/22 Right  04/23/2022 Left 04/23/2022 L 06/04/22  Shoulder extension 38 48 39 32 61 54 51 58   60 52 66  Shoulder flexion 157 152 100 51 148 115 163 143  155 167 159 165  Shoulder abduction 165 163 60 58 169 120 179 133  165 180 179 150  Shoulder internal rotation 67 62 63 NT 54 55 68 64   68 66 82  Shoulder external rotation 77 77 84 NT 90 78 97 88   99 95 90                          (Blank rows = not tested)    LYMPHEDEMA ASSESSMENTS:    LANDMARK RIGHT 11/27/2021 LEFT 11/27/2021 RIGHT 02/03/2022 LEFT 02/03/2022  10 cm proximal to olecranon process 32 31.9 31.8 31.5  Olecranon process 27 27 27.7 26.4  10 cm proximal to ulnar styloid process 23.7 23 23.3 22.7  Just proximal to ulnar styloid process 16.4 16.8 16.1 16.7  Across hand at thumb web space 19 18.7 19 18.8  At base of 2nd digit 6.8 7.5 6.7 7.5  (Blank rows = not tested)   Treatment:  07/10/2022 Pulleys x 1;30 flexion and abduction Ball rolls x 10 flexion and 5 abduction on left Red theraband scapular retraction, shoulder extension and bilateral ER  x 10, x5 Elbow flexion 3# x 10 Jobes flexion and scaption x 10 1# Posterior shoulder stretch, triceps stretch, single arm chest stretch x 2 Soft tissue mobilization to left UT,pecs,lats and serratus in supine  with cocoabutter  07/08/2022  Ball rolls flex x 10, abd x 5 Scapular retraction, shoulder extension, and bilateral ER with red x 10, Elbow flexion and extension with red x 10, Jobes flexion  and scaption 2 # x 5, 1# x 5 Posterior shoulder stretch x 2 B, triceps stretch x 2, Sidebending stretch x 2 ea side Soft tissue mobilization to left UT,pecs,lats and serratus in supine and R S/Land to medial scapular border with cocoabutter  07/01/2022 Progressed ROM and strength Ball rolls up wall flexion x 10, abd x 5, Right and left standing SB stretch x 5, standing triceps stretch x 3,  Standing lat stretch x  3, Elbow flexion and triceps kick back 3# x 10, jobes flexion and scaption 1 # x 10, Elbow flexion and extension with red band x 5, scapular retraction and shoulder extension red band x 10 Updated HEP with all pictures of exercises. Soft tissue mobilization to left pecs,lats and serratus in supine and R S/Land to medial scapular border where pt palpably very tight and tender PROM left shoulder flexion and scaption while performing MFR to L axilla Surgery type/Date: 01/07/2022 Number of lymph nodes removed: 2 on left Current/past treatment (chemo, radiation, hormone therapy): Radiation to chest for Non-hodgkins lymphoma in 1985 Other symptoms:  Heaviness/tightness Yes Pain Yes Pitting edema No Infections No Decreased scar mobility Yes Stemmer sign No     PATIENT EDUCATION:  07/01/2022 SR, shoulder ext, bilateral ER wih red, elbow  flex and ext with red, jobes flex and scaption 1#. Pt given illustrated and written instructions and able to return demonstrate  Access Code: [facility] URL: [email or web address] Date: 05/07/2022 Prepared by: Cheral Almas  Exercises - Supine Shoulder Alphabet  - 1 x daily - 7 x weekly - 1 sets - 1 reps - Supine Single Arm Scapular Protraction  - 1 x daily - 7 x weekly - 1-2 sets - 10 reps   Access Code: [email or web address] URL: [email or web address] Date: 04/23/2022 Prepared by: Cheral Almas  Exercises - Single Arm Doorway Pec Stretch at 60 Elevation  - 1 x daily - 7 x weekly - 1 sets - 3 reps - 15 hold Also standing scapular retraction, shoulder extension and bilateral ER x 10, shoulder elevation x 10- Chest Stretch with Shoulder Squeeze  - 1 x daily - 7 x weekly - 5 reps - 5-10 hold  Person educated: Patient  Education method: Explanation, demonstration, handout Education comprehension: verbalized, demonstrated understanding, and pt returned demonstration      HOME EXERCISE PROGRAM:  Reviewed previously given post op HEP.  Performed supine clasped hands flexion and stargazer today with improved ease 4/14- issued supine dowel flexion and abduction , 03/05/2022 Lower trunk rotation with arms outstretched; 03/10/22: Self MLD to Lt lateral trunk, 03/26/22 scapular clock movements  04/16/2022 Supine scapular series except sword x 5. Gave yellow band 04/23/2022 Standing scapular retraction, shoulder extension, bilateral ER with yellow, elevation with back against wall, SB stretch with arm overhead, and single arm pec stretch. ASSESSMENT:   CLINICAL IMPRESSION: Pt did well after last visit and was agreeable to exercising again. She was able to increase repetitions with theraband exercises, and increase resistance with biceps curls. Ended with manual work to posterior cervicals,UT, left pecs and lateral trunk with VC's to pt to relax. Pt is pending implant exchange on Sept 5. She will not be discharged today incase she feels she needs to return after her next surgery.     Pt will benefit from skilled therapeutic intervention to improve on the following deficits: Decreased knowledge of precautions, impaired UE functional use, pain, decreased ROM, postural dysfunction.    PT treatment/interventions: ADL/Self care home management, Therapeutic exercises, Therapeutic activity, Neuromuscular re-education, Balance training, Gait training, Patient/Family education, and Joint mobilization         GOALS: Goals reviewed with patient? Yes   LONG TERM GOALS:  (STG=LTG)   GOALS Name Target Date Goal status  1 Pt will demonstrate she has regained full shoulder ROM and function post operatively compared to baselines.  Baseline: 03/31/2022 MET for ROM except left extension  2 Patient will increase bilateral shoulder flexion to be >/= 140 degrees for increased ease reaching overhead.   03/03/2022 MET 03/03/2022  3 Patient will increase bilateral shoulder abduction to be >/= 150 degrees for increased ability to obtain radiation positioning.  03/31/2022 MET  4 Patient will improve her DASH score to be </= 10 for improved overall UE function. 03/31/2022 Ongoing 41% today 18% on 03/26/22 22% 04/23/2022 18% 06/04/22  5 Pt will be able to perform sidelying abduction with proper scapular movement to limit impingement 04/23/22 Ongoing - still having pain with this as of 06/04/22        PLAN: PT FREQUENCY/DURATION: 1x/week for 6 weeks, avoiding weeks with chemo   PLAN FOR NEXT SESSION: contscapular movement and protraction/retraction work, ; cont gentle PROM, STM to Lt  axillary and periscapular area;  strength and flexibilityand use of UE's, Add reach to cabinet  Claris Pong, PT 07/10/2022, 2:05 PM

## 2022-07-10 NOTE — Telephone Encounter (Signed)
Left message with follow-up appointment per 8/30 los.

## 2022-07-15 ENCOUNTER — Other Ambulatory Visit: Payer: Self-pay

## 2022-07-15 ENCOUNTER — Encounter (HOSPITAL_BASED_OUTPATIENT_CLINIC_OR_DEPARTMENT_OTHER)
Admission: RE | Disposition: A | Discharge status: Home / Self Care | Payer: Self-pay | Source: Ambulatory Visit | Attending: Plastic Surgery

## 2022-07-15 ENCOUNTER — Encounter (HOSPITAL_BASED_OUTPATIENT_CLINIC_OR_DEPARTMENT_OTHER): Payer: Self-pay | Admitting: Plastic Surgery

## 2022-07-15 ENCOUNTER — Ambulatory Visit (HOSPITAL_BASED_OUTPATIENT_CLINIC_OR_DEPARTMENT_OTHER): Payer: BC Managed Care – PPO | Admitting: Anesthesiology

## 2022-07-15 ENCOUNTER — Ambulatory Visit (HOSPITAL_BASED_OUTPATIENT_CLINIC_OR_DEPARTMENT_OTHER)
Admission: RE | Admit: 2022-07-15 | Discharge: 2022-07-15 | Disposition: A | Discharge status: Home / Self Care | Payer: BC Managed Care – PPO | Source: Ambulatory Visit | Attending: Plastic Surgery | Admitting: Plastic Surgery

## 2022-07-15 DIAGNOSIS — Z803 Family history of malignant neoplasm of breast: Secondary | ICD-10-CM | POA: Insufficient documentation

## 2022-07-15 DIAGNOSIS — Z923 Personal history of irradiation: Secondary | ICD-10-CM | POA: Insufficient documentation

## 2022-07-15 DIAGNOSIS — Z421 Encounter for breast reconstruction following mastectomy: Secondary | ICD-10-CM | POA: Insufficient documentation

## 2022-07-15 DIAGNOSIS — Z87891 Personal history of nicotine dependence: Secondary | ICD-10-CM | POA: Insufficient documentation

## 2022-07-15 DIAGNOSIS — Z01818 Encounter for other preprocedural examination: Secondary | ICD-10-CM

## 2022-07-15 DIAGNOSIS — Z8572 Personal history of non-Hodgkin lymphomas: Secondary | ICD-10-CM | POA: Diagnosis not present

## 2022-07-15 DIAGNOSIS — L409 Psoriasis, unspecified: Secondary | ICD-10-CM | POA: Diagnosis not present

## 2022-07-15 DIAGNOSIS — Z853 Personal history of malignant neoplasm of breast: Secondary | ICD-10-CM | POA: Diagnosis not present

## 2022-07-15 HISTORY — PX: REMOVAL OF BILATERAL TISSUE EXPANDERS WITH PLACEMENT OF BILATERAL BREAST IMPLANTS: SHX6431

## 2022-07-15 HISTORY — DX: Gastro-esophageal reflux disease without esophagitis: K21.9

## 2022-07-15 HISTORY — DX: Dyspnea, unspecified: R06.00

## 2022-07-15 SURGERY — REMOVAL, TISSUE EXPANDER, BREAST, BILATERAL, WITH BILATERAL IMPLANT IMPLANT INSERTION
Anesthesia: General | Site: Chest | Laterality: Bilateral

## 2022-07-15 MED ORDER — CELECOXIB 200 MG PO CAPS: 200.0000 mg | ORAL_CAPSULE | ORAL | Status: DC

## 2022-07-15 MED ORDER — LIDOCAINE 2% (20 MG/ML) 5 ML SYRINGE
INTRAMUSCULAR | Status: AC
  Filled 2022-07-15: qty 5

## 2022-07-15 MED ORDER — OXYCODONE HCL 5 MG/5ML PO SOLN: 5.0000 mg | Freq: Once | ORAL | Status: DC | PRN

## 2022-07-15 MED ORDER — ACETAMINOPHEN 500 MG PO TABS
ORAL_TABLET | ORAL | Status: AC
  Filled 2022-07-15: qty 2

## 2022-07-15 MED ORDER — VANCOMYCIN HCL IN DEXTROSE 1-5 GM/200ML-% IV SOLN
1000.0000 mg | INTRAVENOUS | Status: AC
  Administered 2022-07-15: 1000 mg via INTRAVENOUS

## 2022-07-15 MED ORDER — FENTANYL CITRATE (PF) 100 MCG/2ML IJ SOLN
25.0000 ug | INTRAMUSCULAR | Status: DC | PRN
  Administered 2022-07-15: 25 ug via INTRAVENOUS

## 2022-07-15 MED ORDER — DEXMEDETOMIDINE HCL IN NACL 80 MCG/20ML IV SOLN
INTRAVENOUS | Status: AC
  Filled 2022-07-15: qty 20

## 2022-07-15 MED ORDER — BUPIVACAINE HCL (PF) 0.5 % IJ SOLN
INTRAMUSCULAR | Status: DC | PRN
  Administered 2022-07-15: 30 mL

## 2022-07-15 MED ORDER — PROPOFOL 500 MG/50ML IV EMUL
INTRAVENOUS | Status: AC
  Filled 2022-07-15: qty 100

## 2022-07-15 MED ORDER — CHLORHEXIDINE GLUCONATE CLOTH 2 % EX PADS: 6.0000 | MEDICATED_PAD | Freq: Once | CUTANEOUS | Status: DC

## 2022-07-15 MED ORDER — VANCOMYCIN HCL IN DEXTROSE 1-5 GM/200ML-% IV SOLN
INTRAVENOUS | Status: AC
  Filled 2022-07-15: qty 200

## 2022-07-15 MED ORDER — CELECOXIB 200 MG PO CAPS
ORAL_CAPSULE | ORAL | Status: AC
  Filled 2022-07-15: qty 1

## 2022-07-15 MED ORDER — BUPIVACAINE HCL (PF) 0.5 % IJ SOLN
INTRAMUSCULAR | Status: AC
  Filled 2022-07-15: qty 30

## 2022-07-15 MED ORDER — MIDAZOLAM HCL 2 MG/2ML IJ SOLN
INTRAMUSCULAR | Status: AC
  Filled 2022-07-15: qty 2

## 2022-07-15 MED ORDER — SODIUM CHLORIDE 0.9 % IV SOLN
INTRAVENOUS | Status: AC
  Filled 2022-07-15: qty 10

## 2022-07-15 MED ORDER — FENTANYL CITRATE (PF) 100 MCG/2ML IJ SOLN
INTRAMUSCULAR | Status: AC
  Filled 2022-07-15: qty 2

## 2022-07-15 MED ORDER — ROCURONIUM BROMIDE 10 MG/ML (PF) SYRINGE
PREFILLED_SYRINGE | INTRAVENOUS | Status: AC
  Filled 2022-07-15: qty 10

## 2022-07-15 MED ORDER — PROPOFOL 10 MG/ML IV BOLUS
INTRAVENOUS | Status: DC | PRN
  Administered 2022-07-15: 200 mg via INTRAVENOUS

## 2022-07-15 MED ORDER — LACTATED RINGERS IV SOLN: INTRAVENOUS | Status: DC

## 2022-07-15 MED ORDER — GABAPENTIN 300 MG PO CAPS
300.0000 mg | ORAL_CAPSULE | ORAL | Status: AC
  Administered 2022-07-15: 300 mg via ORAL

## 2022-07-15 MED ORDER — ROCURONIUM BROMIDE 100 MG/10ML IV SOLN
INTRAVENOUS | Status: DC | PRN
  Administered 2022-07-15: 60 mg via INTRAVENOUS

## 2022-07-15 MED ORDER — ONDANSETRON HCL 4 MG/2ML IJ SOLN: 4.0000 mg | Freq: Once | INTRAMUSCULAR | Status: DC | PRN

## 2022-07-15 MED ORDER — FENTANYL CITRATE (PF) 100 MCG/2ML IJ SOLN
INTRAMUSCULAR | Status: DC | PRN
  Administered 2022-07-15: 50 ug via INTRAVENOUS
  Administered 2022-07-15: 25 ug via INTRAVENOUS
  Administered 2022-07-15: 50 ug via INTRAVENOUS
  Administered 2022-07-15: 25 ug via INTRAVENOUS

## 2022-07-15 MED ORDER — DEXAMETHASONE SODIUM PHOSPHATE 10 MG/ML IJ SOLN
INTRAMUSCULAR | Status: AC
  Filled 2022-07-15: qty 1

## 2022-07-15 MED ORDER — SUGAMMADEX SODIUM 200 MG/2ML IV SOLN
INTRAVENOUS | Status: DC | PRN
  Administered 2022-07-15: 200 mg via INTRAVENOUS

## 2022-07-15 MED ORDER — DEXAMETHASONE SODIUM PHOSPHATE 4 MG/ML IJ SOLN
INTRAMUSCULAR | Status: DC | PRN
  Administered 2022-07-15: 5 mg via INTRAVENOUS

## 2022-07-15 MED ORDER — GABAPENTIN 300 MG PO CAPS
ORAL_CAPSULE | ORAL | Status: AC
  Filled 2022-07-15: qty 1

## 2022-07-15 MED ORDER — AMISULPRIDE (ANTIEMETIC) 5 MG/2ML IV SOLN
10.0000 mg | Freq: Once | INTRAVENOUS | Status: DC | PRN
Start: 2022-07-15 — End: 2022-07-15

## 2022-07-15 MED ORDER — OXYCODONE HCL 5 MG PO TABS: 5.0000 mg | ORAL_TABLET | Freq: Once | ORAL | Status: DC | PRN

## 2022-07-15 MED ORDER — PHENYLEPHRINE HCL (PRESSORS) 10 MG/ML IV SOLN: INTRAVENOUS | Status: DC | PRN

## 2022-07-15 MED ORDER — ONDANSETRON HCL 4 MG/2ML IJ SOLN
INTRAMUSCULAR | Status: DC | PRN
  Administered 2022-07-15: 4 mg via INTRAVENOUS

## 2022-07-15 MED ORDER — LIDOCAINE HCL (CARDIAC) PF 100 MG/5ML IV SOSY
PREFILLED_SYRINGE | INTRAVENOUS | Status: DC | PRN
  Administered 2022-07-15: 60 mg via INTRAVENOUS

## 2022-07-15 MED ORDER — PHENYLEPHRINE HCL (PRESSORS) 10 MG/ML IV SOLN
INTRAVENOUS | Status: DC | PRN
  Administered 2022-07-15: 40 ug via INTRAVENOUS
  Administered 2022-07-15: 80 ug via INTRAVENOUS
  Administered 2022-07-15 (×2): 40 ug via INTRAVENOUS

## 2022-07-15 MED ORDER — ONDANSETRON HCL 4 MG/2ML IJ SOLN
INTRAMUSCULAR | Status: AC
  Filled 2022-07-15: qty 4

## 2022-07-15 MED ORDER — MIDAZOLAM HCL 5 MG/5ML IJ SOLN
INTRAMUSCULAR | Status: DC | PRN
  Administered 2022-07-15: 2 mg via INTRAVENOUS

## 2022-07-15 MED ORDER — ACETAMINOPHEN 500 MG PO TABS
1000.0000 mg | ORAL_TABLET | ORAL | Status: AC
  Administered 2022-07-15: 1000 mg via ORAL

## 2022-07-15 MED ORDER — PROPOFOL 500 MG/50ML IV EMUL
INTRAVENOUS | Status: DC | PRN
  Administered 2022-07-15: 25 ug/kg/min via INTRAVENOUS

## 2022-07-15 MED ORDER — SODIUM CHLORIDE 0.9 % IV SOLN
INTRAVENOUS | Status: DC | PRN
  Administered 2022-07-15: 500 mL

## 2022-07-15 SURGICAL SUPPLY — 70 items
ADH SKN CLS APL DERMABOND .7 (GAUZE/BANDAGES/DRESSINGS) ×2
APL PRP STRL LF DISP 70% ISPRP (MISCELLANEOUS) ×2
BAG DECANTER FOR FLEXI CONT (MISCELLANEOUS) ×1 IMPLANT
BINDER BREAST 3XL (GAUZE/BANDAGES/DRESSINGS) IMPLANT
BINDER BREAST LRG (GAUZE/BANDAGES/DRESSINGS) IMPLANT
BINDER BREAST MEDIUM (GAUZE/BANDAGES/DRESSINGS) IMPLANT
BINDER BREAST XLRG (GAUZE/BANDAGES/DRESSINGS) IMPLANT
BINDER BREAST XXLRG (GAUZE/BANDAGES/DRESSINGS) IMPLANT
BLADE SURG 10 STRL SS (BLADE) ×2 IMPLANT
BNDG GAUZE DERMACEA FLUFF 4 (GAUZE/BANDAGES/DRESSINGS) ×2 IMPLANT
BNDG GZE DERMACEA 4 6PLY (GAUZE/BANDAGES/DRESSINGS) ×2
CANISTER SUCT 1200ML W/VALVE (MISCELLANEOUS) ×1 IMPLANT
CHLORAPREP W/TINT 26 (MISCELLANEOUS) ×2 IMPLANT
COVER BACK TABLE 60X90IN (DRAPES) ×1 IMPLANT
COVER MAYO STAND STRL (DRAPES) ×1 IMPLANT
DERMABOND ADVANCED (GAUZE/BANDAGES/DRESSINGS) ×2
DERMABOND ADVANCED .7 DNX12 (GAUZE/BANDAGES/DRESSINGS) ×2 IMPLANT
DRAIN CHANNEL 15F RND FF W/TCR (WOUND CARE) IMPLANT
DRAPE TOP ARMCOVERS (MISCELLANEOUS) ×1 IMPLANT
DRAPE U-SHAPE 76X120 STRL (DRAPES) ×1 IMPLANT
DRAPE UTILITY XL STRL (DRAPES) ×1 IMPLANT
ELECT BLADE 4.0 EZ CLEAN MEGAD (MISCELLANEOUS) ×1
ELECT COATED BLADE 2.86 ST (ELECTRODE) ×1 IMPLANT
ELECT REM PT RETURN 9FT ADLT (ELECTROSURGICAL) ×1
ELECTRODE BLDE 4.0 EZ CLN MEGD (MISCELLANEOUS) IMPLANT
ELECTRODE REM PT RTRN 9FT ADLT (ELECTROSURGICAL) ×1 IMPLANT
EVACUATOR SILICONE 100CC (DRAIN) IMPLANT
GAUZE PAD ABD 8X10 STRL (GAUZE/BANDAGES/DRESSINGS) ×2 IMPLANT
GLOVE BIO SURGEON STRL SZ 6 (GLOVE) ×2 IMPLANT
GOWN STRL REUS W/ TWL LRG LVL3 (GOWN DISPOSABLE) ×2 IMPLANT
GOWN STRL REUS W/TWL LRG LVL3 (GOWN DISPOSABLE) ×2
IMPL BREAST P6.1XRND LO 495 (Breast) IMPLANT
IMPL BRST P6.1XRND LO 495CC (Breast) ×2 IMPLANT
IMPLANT BREAST GEL 495CC (Breast) ×2 IMPLANT
KIT FILL ASEPTIC TRANSFER (MISCELLANEOUS) IMPLANT
MARKER SKIN DUAL TIP RULER LAB (MISCELLANEOUS) IMPLANT
NDL FILTER BLUNT 18X1 1/2 (NEEDLE) IMPLANT
NDL HYPO 18GX1.5 BLUNT FILL (NEEDLE) IMPLANT
NDL HYPO 25X1 1.5 SAFETY (NEEDLE) IMPLANT
NEEDLE FILTER BLUNT 18X 1/2SAF (NEEDLE)
NEEDLE FILTER BLUNT 18X1 1/2 (NEEDLE) IMPLANT
NEEDLE HYPO 18GX1.5 BLUNT FILL (NEEDLE) ×1 IMPLANT
NEEDLE HYPO 25X1 1.5 SAFETY (NEEDLE) IMPLANT
PACK BASIN DAY SURGERY FS (CUSTOM PROCEDURE TRAY) ×1 IMPLANT
PENCIL SMOKE EVACUATOR (MISCELLANEOUS) ×1 IMPLANT
PIN SAFETY STERILE (MISCELLANEOUS) IMPLANT
SHEET MEDIUM DRAPE 40X70 STRL (DRAPES) ×1 IMPLANT
SIZER BREAST 470CC (SIZER) ×1
SIZER BREAST 495CC (SIZER) ×1
SIZER BRST 495CC (SIZER) IMPLANT
SIZER BRST P6.1X 470CC (SIZER) IMPLANT
SLEEVE SCD COMPRESS KNEE MED (STOCKING) ×1 IMPLANT
SPIKE FLUID TRANSFER (MISCELLANEOUS) IMPLANT
SPONGE T-LAP 18X18 ~~LOC~~+RFID (SPONGE) ×1 IMPLANT
STAPLER VISISTAT 35W (STAPLE) ×1 IMPLANT
SUT ETHILON 2 0 FS 18 (SUTURE) IMPLANT
SUT MNCRL AB 4-0 PS2 18 (SUTURE) IMPLANT
SUT PDS AB 2-0 CT2 27 (SUTURE) IMPLANT
SUT VIC AB 3-0 PS1 18 (SUTURE) ×1
SUT VIC AB 3-0 PS1 18XBRD (SUTURE) IMPLANT
SUT VIC AB 3-0 SH 27 (SUTURE) ×2
SUT VIC AB 3-0 SH 27X BRD (SUTURE) IMPLANT
SUT VICRYL 4-0 PS2 18IN ABS (SUTURE) IMPLANT
SYR 20ML LL LF (SYRINGE) IMPLANT
SYR BULB IRRIG 60ML STRL (SYRINGE) ×2 IMPLANT
SYR CONTROL 10ML LL (SYRINGE) IMPLANT
TOWEL GREEN STERILE FF (TOWEL DISPOSABLE) ×2 IMPLANT
TUBE CONNECTING 20X1/4 (TUBING) ×1 IMPLANT
UNDERPAD 30X36 HEAVY ABSORB (UNDERPADS AND DIAPERS) ×2 IMPLANT
YANKAUER SUCT BULB TIP NO VENT (SUCTIONS) ×1 IMPLANT

## 2022-07-15 NOTE — Anesthesia Preprocedure Evaluation (Signed)
Anesthesia Evaluation  Patient identified by MRN, date of birth, ID band Patient awake    Reviewed: Allergy & Precautions, NPO status , Patient's Chart, lab work & pertinent test results  History of Anesthesia Complications Negative for: history of anesthetic complications  Airway Mallampati: II  TM Distance: >3 FB Neck ROM: Full    Dental  (+) Dental Advisory Given, Teeth Intact   Pulmonary neg pulmonary ROS, former smoker,    Pulmonary exam normal        Cardiovascular negative cardio ROS Normal cardiovascular exam     Neuro/Psych Anxiety negative neurological ROS     GI/Hepatic Neg liver ROS, hiatal hernia, GERD  ,  Endo/Other  negative endocrine ROS  Renal/GU negative Renal ROS  negative genitourinary   Musculoskeletal negative musculoskeletal ROS (+)   Abdominal   Peds  Hematology  (+) Blood dyscrasia, anemia , H/o non Hodgkin's lymphoma   Anesthesia Other Findings H/o breast cancer s/p mastectomy  Reproductive/Obstetrics                             Anesthesia Physical Anesthesia Plan  ASA: 2  Anesthesia Plan: General   Post-op Pain Management: Tylenol PO (pre-op)* and Toradol IV (intra-op)*   Induction: Intravenous  PONV Risk Score and Plan: 3 and Ondansetron, Dexamethasone, Treatment may vary due to age or medical condition and Midazolam  Airway Management Planned: Oral ETT  Additional Equipment: None  Intra-op Plan:   Post-operative Plan: Extubation in OR  Informed Consent: I have reviewed the patients History and Physical, chart, labs and discussed the procedure including the risks, benefits and alternatives for the proposed anesthesia with the patient or authorized representative who has indicated his/her understanding and acceptance.     Dental advisory given  Plan Discussed with:   Anesthesia Plan Comments:         Anesthesia Quick Evaluation

## 2022-07-15 NOTE — Discharge Instructions (Addendum)
  Post Anesthesia Home Care Instructions  Activity: Get plenty of rest for the remainder of the day. A responsible individual must stay with you for 24 hours following the procedure.  For the next 24 hours, DO NOT: -Drive a car -Paediatric nurse -Drink alcoholic beverages -Take any medication unless instructed by your physician -Make any legal decisions or sign important papers.  Meals: Start with liquid foods such as gelatin or soup. Progress to regular foods as tolerated. Avoid greasy, spicy, heavy foods. If nausea and/or vomiting occur, drink only clear liquids until the nausea and/or vomiting subsides. Call your physician if vomiting continues.  Special Instructions/Symptoms: Your throat may feel dry or sore from the anesthesia or the breathing tube placed in your throat during surgery. If this causes discomfort, gargle with warm salt water. The discomfort should disappear within 24 hours.  Next dose of Tylenol can be taken today at 1230pm if needed.

## 2022-07-15 NOTE — Op Note (Signed)
Operative Note   DATE OF OPERATION: 9.5.23  LOCATION: [facility]-outpatient  SURGICAL DIVISION: Plastic Surgery  PREOPERATIVE DIAGNOSES:  1. History breast cancer 2. Acquired absence breasts 3. History mantle radiation  POSTOPERATIVE DIAGNOSES:  same  PROCEDURE:  Removal bilateral chest tissue expanders and placement silicone implants  SURGEON: Irene Limbo MD MBA  ASSISTANT: none  ANESTHESIA:  General.   EBL: 5 ml  COMPLICATIONS: None immediate.   INDICATIONS FOR PROCEDURE:  The patient, Erika Hodge, is a 56 y.o. female born on [birth year], is here for staged breast reconstruction following nipple sparing mastectomies with prepectoral tissue expander acellular dermis reconstruction.   FINDINGS: Complete incorporation ADM bilateral noted. Natrelle Soft Touch Smooth Round Extra Projection 495 ml implants placed bilateral REF [id number] RIGHT SN [id number] LEFT SN [id number]  DESCRIPTION OF PROCEDURE:  The patient's operative site was marked with the patient in the preoperative area. The patient was taken to the operating room. SCDs were placed and IV antibiotics were given. The patient's operative site was prepped and draped in a sterile fashion. A time out was performed and all information was confirmed to be correct.  Incision made in bilateral inframammary fold scar and carried through superficial fascia and ADM. Expanders removed. Over left chest medial superior and lateral capsulotomies performed. Over left lower pole additional radial scoring capsule completed. Over right chest superior capsulotomy performed. Sizer placed in each cavity. Extra projection 495 ml implants selected for bilateral placement.    Each cavity irrigated with saline solution containing Ancef gentamicin and Betadine. Hemostasis ensured. Local anesthetic infiltrated in each chest. Implant placed in left chest cavity. Implant orientation ensured. Closure completed with 3-0 vicryl to approximated capsule  and superficial fascia, 4-0 vicryl in dermis, and 4-0 monocryl subcuticular skin closure. Over right chest, implant placed. Implant orientation ensured. Closure completed with 3-0 vicryl to approximated capsule and superficial fascia, 4-0 vicryl in dermis, and 4-0 monocryl subcuticular skin closure. Dermabond applied to incisions followed by dry dressing, breast binder.   The patient was allowed to wake from anesthesia, extubated and taken to the recovery room in satisfactory condition.   SPECIMENS: none  DRAINS: none  Irene Limbo, MD [facility]astic [facility]  Office/ physician access line after hours [phone]

## 2022-07-15 NOTE — Anesthesia Postprocedure Evaluation (Signed)
Anesthesia Post Note  Patient: Erika Hodge  Procedure(s) Performed: REMOVAL OF BILATERAL TISSUE EXPANDERS WITH PLACEMENT OF BILATERAL BREAST IMPLANTS (Bilateral: Chest)     Patient location during evaluation: PACU Anesthesia Type: General Level of consciousness: awake and alert Pain management: pain level controlled Vital Signs Assessment: post-procedure vital signs reviewed and stable Respiratory status: spontaneous breathing, nonlabored ventilation and respiratory function stable Cardiovascular status: blood pressure returned to baseline and stable Postop Assessment: no apparent nausea or vomiting Anesthetic complications: no   No notable events documented.  Last Vitals:  Vitals:   07/15/22 1030 07/15/22 1050  BP: 132/79 134/70  Pulse: 98 99  Resp: (!) 23 16  Temp:  (!) 36.3 C  SpO2: 95% 96%    Last Pain:  Vitals:   07/15/22 1050  TempSrc: Oral  PainSc: 0-No pain                 Lidia Collum

## 2022-07-15 NOTE — Anesthesia Procedure Notes (Signed)
Procedure Name: Intubation Date/Time: 07/15/2022 7:27 AM  Performed by: Prim Morace, Ernesta Amble, CRNAPre-anesthesia Checklist: Patient identified, Emergency Drugs available, Suction available and Patient being monitored Patient Re-evaluated:Patient Re-evaluated prior to induction Oxygen Delivery Method: Circle system utilized Preoxygenation: Pre-oxygenation with 100% oxygen Induction Type: IV induction Ventilation: Mask ventilation without difficulty Laryngoscope Size: Mac and 3 Grade View: Grade I Tube type: Oral Tube size: 7.0 mm Number of attempts: 1 Airway Equipment and Method: Stylet and Oral airway Placement Confirmation: ETT inserted through vocal cords under direct vision, positive ETCO2 and breath sounds checked- equal and bilateral Secured at: 21 cm Tube secured with: Tape Dental Injury: Teeth and Oropharynx as per pre-operative assessment

## 2022-07-15 NOTE — Transfer of Care (Signed)
Immediate Anesthesia Transfer of Care Note  Patient: Erika Hodge  Procedure(s) Performed: REMOVAL OF BILATERAL TISSUE EXPANDERS WITH PLACEMENT OF BILATERAL BREAST IMPLANTS (Bilateral: Chest)  Patient Location: PACU  Anesthesia Type:General  Level of Consciousness: drowsy and patient cooperative  Airway & Oxygen Therapy: Patient Spontanous Breathing and Patient connected to face mask oxygen  Post-op Assessment: Report given to RN and Post -op Vital signs reviewed and stable  Post vital signs: Reviewed and stable  Last Vitals:  Vitals Value Taken Time  BP 129/75 07/15/22 0921  Temp    Pulse 98 07/15/22 0922  Resp 14 07/15/22 0922  SpO2 98 % 07/15/22 0922  Vitals shown include unvalidated device data.  Last Pain:  Vitals:   07/15/22 0630  TempSrc: Oral  PainSc: 0-No pain      Patients Stated Pain Goal: 3 (28/31/51 7616)  Complications: No notable events documented.

## 2022-07-15 NOTE — Interval H&P Note (Signed)
History and Physical Interval Note:  07/15/2022 6:52 AM  Erika Hodge  has presented today for surgery, with the diagnosis of history breast cancer, acquired absence breasts.  The various methods of treatment have been discussed with the patient and family. After consideration of risks, benefits and other options for treatment, the patient has consented to  Procedure(s): REMOVAL OF BILATERAL TISSUE EXPANDERS WITH PLACEMENT OF BILATERAL BREAST IMPLANTS (Bilateral) as a surgical intervention.  The patient's history has been reviewed, patient examined, no change in status, stable for surgery.  I have reviewed the patient's chart and labs.  Questions were answered to the patient's satisfaction.     [name] Angeleena Dueitt

## 2022-07-16 ENCOUNTER — Encounter (HOSPITAL_BASED_OUTPATIENT_CLINIC_OR_DEPARTMENT_OTHER): Payer: Self-pay | Admitting: Plastic Surgery

## 2022-07-24 ENCOUNTER — Ambulatory Visit (HOSPITAL_COMMUNITY): Payer: BC Managed Care – PPO

## 2022-07-30 ENCOUNTER — Ambulatory Visit: Payer: BC Managed Care – PPO | Admitting: Internal Medicine

## 2022-07-30 ENCOUNTER — Other Ambulatory Visit: Payer: Self-pay | Admitting: Hematology

## 2022-07-30 ENCOUNTER — Ambulatory Visit: Payer: BC Managed Care – PPO | Admitting: Primary Care

## 2022-07-31 ENCOUNTER — Other Ambulatory Visit: Payer: Self-pay | Admitting: Hematology

## 2022-07-31 ENCOUNTER — Ambulatory Visit: Payer: BC Managed Care – PPO | Attending: Surgery

## 2022-07-31 DIAGNOSIS — Z170 Estrogen receptor positive status [ER+]: Secondary | ICD-10-CM | POA: Insufficient documentation

## 2022-07-31 DIAGNOSIS — M25612 Stiffness of left shoulder, not elsewhere classified: Secondary | ICD-10-CM | POA: Diagnosis present

## 2022-07-31 DIAGNOSIS — M6281 Muscle weakness (generalized): Secondary | ICD-10-CM | POA: Insufficient documentation

## 2022-07-31 DIAGNOSIS — Z483 Aftercare following surgery for neoplasm: Secondary | ICD-10-CM | POA: Diagnosis present

## 2022-07-31 DIAGNOSIS — C50212 Malignant neoplasm of upper-inner quadrant of left female breast: Secondary | ICD-10-CM | POA: Insufficient documentation

## 2022-07-31 DIAGNOSIS — R293 Abnormal posture: Secondary | ICD-10-CM | POA: Diagnosis present

## 2022-07-31 NOTE — Therapy (Signed)
OUTPATIENT PHYSICAL THERAPY TREATMENT NOTE   Patient Name: Erika Hodge MRN: [id number] DOB:1966/02/03, 56 y.o., female Today's Date: 07/31/2022  PCP: Bonnita Nasuti, MD REFERRING PROVIDER: Erroll Luna, MD  END OF SESSION:   PT End of Session - 07/31/22 1358     Visit Number 22    Number of Visits 26    Date for PT Re-Evaluation 09/11/22    PT Start Time 1400    PT Stop Time 1454    PT Time Calculation (min) 54 min    Activity Tolerance Patient tolerated treatment well    Behavior During Therapy WFL for tasks assessed/performed              Past Medical History:  Diagnosis Date   Anxiety    Complication of anesthesia    HA   Dyspnea    due to chemo   GERD (gastroesophageal reflux disease)    Non Hodgkin's lymphoma ([place])    Psoriasis    Past Surgical History:  Procedure Laterality Date   BREAST BIOPSY Left 11/20/2021   BREAST RECONSTRUCTION WITH PLACEMENT OF TISSUE EXPANDER AND ALLODERM Bilateral 01/07/2022   Procedure: BILATERAL BREAST RECONSTRUCTION WITH PLACEMENT OF TISSUE EXPANDERS AND ALLODERM;  Surgeon: Irene Limbo, MD;  Location: [facility];  Service: Plastics;  Laterality: Bilateral;   NIPPLE SPARING MASTECTOMY Right 01/07/2022   Procedure: RIGHT NIPPLE SPARING MASTECTOMY;  Surgeon: Erroll Luna, MD;  Location: [place];  Service: General;  Laterality: Right;   NIPPLE SPARING MASTECTOMY WITH SENTINEL LYMPH NODE BIOPSY Left 01/07/2022   Procedure: LEFT NIPPLE SPARING MASTECTOMY WITH SENTINEL LYMPH NODE BIOPSY;  Surgeon: Erroll Luna, MD;  Location: [facility];  Service: General;  Laterality: Left;   REMOVAL OF BILATERAL TISSUE EXPANDERS WITH PLACEMENT OF BILATERAL BREAST IMPLANTS Bilateral 07/15/2022   Procedure: REMOVAL OF BILATERAL TISSUE EXPANDERS WITH PLACEMENT OF BILATERAL BREAST IMPLANTS;  Surgeon: Irene Limbo, MD;  Location: [facility];  Service: Plastics;  Laterality:  Bilateral;   TUBAL LIGATION     Patient Active Problem List   Diagnosis Date Noted   Hypokalemia 04/08/2022   Anemia of chronic disease 04/08/2022   Restless leg syndrome 04/08/2022   Obesity (BMI 30-39.9) 04/08/2022   Bronchiectasis with acute exacerbation ([place]) 04/07/2022   Neutropenic fever ([place]) 04/07/2022   Pulmonary nodules 04/07/2022   Anxiety    Hiatal hernia with GERD    Invasive lobular carcinoma of right breast, stage 1 ([place]) 01/24/2022   Genetic testing 12/11/2021   Family history of breast cancer 11/29/2021   Malignant neoplasm of upper-inner quadrant of left breast in female, estrogen receptor positive ([place]) 11/26/2021    REFERRING DIAG: s/p bilateral Mastectomies with left SLNB  THERAPY DIAG:  Aftercare following surgery for neoplasm  Stiffness of left shoulder, not elsewhere classified  Muscle weakness (generalized)  Abnormal posture  Malignant neoplasm of upper-inner quadrant of left breast in female, estrogen receptor positive ([place])  PERTINENT HISTORY: Patient was diagnosed on 09/10/2021 with 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 Two negative axillary nodes were removed on the left side. An incidental finding of right breast invasive lobular carcinoma was also found and is ER/PR positive and HER2 negative. It is weakly ER positive, PR negative and HER2 negative with a Ki67 of 20%. She has a left wrist plate from a previous surgery in 2006 and a history of non-Hodgkins  Lymphoma in 1985.  PRECAUTIONS: left lymphedema risk, hx of Non-Hodgkins Lymphoma, left wrist plate from prior surgery  SUBJECTIVE:  I had the implant exchange on 07/15/2022.  I immediately felt better from the expanders being gone.  I mopped yesterday and I can feel it today. I think I am guarding some. I'm still real tender where I was cut.  PAIN:  Are you having  pain? No none presently, but it gets worse when I am trying to sleep. 2/10.   02/03/2022 OBSERVATIONS:            Right breast with increased edema compared to left. Left inferior breast incision appears to be slightly macerated. Non-stick pad applied under each breast to reduce fluid leakage. Left axillary incision appears to be healing well.    POSTURE:  Forward head, rounded shoulders   LYMPHEDEMA ASSESSMENT:    A/PROM Right 11/27/2021 Left 11/27/2021 Right 02/03/2022 Left  02/03/2022 Right 02/21/22 Left 02/21/22 Right 03/03/2022 Left 03/03/2022 Right 03/26/22 Left  03/26/22 Right  04/23/2022 Left 04/23/2022 L 06/04/22 Right 9/21 Left  9/21  Shoulder extension 38 48 39 32 61 54 51 58   60 52 66 63 45  Shoulder flexion 157 152 100 51 148 115 163 143  155 167 159 165 167 155  Shoulder abduction 165 163 60 58 169 120 179 133  165 180 179 150 180 175  Shoulder internal rotation 67 62 63 NT 54 55 68 64   68 66 82 62 75  Shoulder external rotation 77 77 84 NT 90 78 97 88   99 95 90 107 107                          (Blank rows = not tested)    LYMPHEDEMA ASSESSMENTS:    LANDMARK RIGHT 11/27/2021 LEFT 11/27/2021 RIGHT 02/03/2022 LEFT 02/03/2022  10 cm proximal to olecranon process 32 31.9 31.8 31.5  Olecranon process 27 27 27.7 26.4  10 cm proximal to ulnar styloid process 23.7 23 23.3 22.7  Just proximal to ulnar styloid process 16.4 16.8 16.1 16.7  Across hand at thumb web space 19 18.7 19 18.8  At base of 2nd digit 6.8 7.5 6.7 7.5   (Blank rows = not tested)  Quick Dash 23%      Treatment:  07/31/2022  Soft tissue mobilization to left pecs, lateral trunk and UT  supine ,and scapular area/UT in right SLwith cocoa butter AA shoulder flexion x 5, star gazer x 5,  Supine AROM bilateral flexion, scaption, horizontal abduction x5 Measured shoulder ROM and assessed goals Pt completed quick dash   07/10/2022 Pulleys x 1;30 flexion and abduction Ball rolls x 10 flexion and 5  abduction on left Red theraband scapular retraction, shoulder extension and bilateral ER  x 10, x5 Elbow flexion 3# x 10 Jobes flexion and scaption x 10 1# Posterior shoulder stretch, triceps stretch, single arm chest stretch x 2 Soft tissue mobilization to left UT,pecs,lats and serratus in supine  with cocoabutter  07/08/2022  Ball rolls flex x 10, abd x 5 Scapular retraction, shoulder extension, and bilateral ER with red x 10, Elbow flexion and extension with red x 10, Jobes flexion  and scaption 2 # x 5, 1# x 5 Posterior shoulder stretch x 2 B, triceps stretch x 2, Sidebending stretch x 2 ea side Soft tissue mobilization to left UT,pecs,lats and serratus in supine and R S/Land to medial scapular border with cocoabutter  07/01/2022 Progressed ROM and strength Ball rolls up wall flexion x 10, abd x 5, Right and left standing SB stretch x 5, standing triceps stretch x 3,  Standing lat stretch x 3, Elbow flexion and triceps kick back 3# x 10, jobes flexion and scaption 1 # x 10, Elbow flexion and extension with red band x 5, scapular retraction and shoulder extension red band x 10 Updated HEP with all pictures of exercises. Soft tissue mobilization to left pecs,lats and serratus in supine and R S/Land to medial scapular border where pt palpably very tight and tender PROM left shoulder flexion and scaption while performing MFR to L axilla Surgery type/Date: 01/07/2022 Number of lymph nodes removed: 2 on left Current/past treatment (chemo, radiation, hormone therapy): Radiation to chest for Non-hodgkins lymphoma in 1985 Other symptoms:  Heaviness/tightness Yes Pain Yes Pitting edema No Infections No Decreased scar mobility Yes Stemmer sign No     PATIENT EDUCATION:  07/01/2022 SR, shoulder ext, bilateral ER wih red, elbow flex and ext with red, jobes flex and scaption 1#. Pt given illustrated and written instructions and able to return demonstrate  Access Code: [facility] URL:  [email or web address] Date: 05/07/2022 Prepared by: Cheral Almas  Exercises - Supine Shoulder Alphabet  - 1 x daily - 7 x weekly - 1 sets - 1 reps - Supine Single Arm Scapular Protraction  - 1 x daily - 7 x weekly - 1-2 sets - 10 reps   Access Code: [email or web address] URL: [email or web address] Date: 04/23/2022 Prepared by: Cheral Almas  Exercises - Single Arm Doorway Pec Stretch at 60 Elevation  - 1 x daily - 7 x weekly - 1 sets - 3 reps - 15 hold Also standing scapular retraction, shoulder extension and bilateral ER x 10, shoulder elevation x 10- Chest Stretch with Shoulder Squeeze  - 1 x daily - 7 x weekly - 5 reps - 5-10 hold  Person educated: Patient  Education method: Explanation, demonstration, handout Education comprehension: verbalized, demonstrated understanding, and pt returned demonstration      HOME EXERCISE PROGRAM:  Reviewed previously given post op HEP. Performed supine clasped hands flexion and stargazer today with improved ease 4/14- issued supine dowel flexion and abduction , 03/05/2022 Lower trunk rotation with arms outstretched; 03/10/22: Self MLD to Lt lateral trunk, 03/26/22 scapular clock movements  04/16/2022 Supine scapular series except sword x 5. Gave yellow band 04/23/2022 Standing scapular retraction, shoulder extension, bilateral ER with yellow, elevation with back against wall, SB stretch with arm overhead, and single arm pec stretch. ASSESSMENT:   CLINICAL IMPRESSION: Pt very tight and tender throughout left upper quarter, and still very guarded on left.  She has not been using the left arm at all and it was very trembly during AROM exercises. Pt still lacking shoulder extension and 10 degrees of shoulder flexion on the left, but doing well overall. She has not yet been released  and would like to resume strengthening exercises after she sees the MD on Oct. 5..   Pt will benefit from skilled therapeutic intervention to improve on the  following deficits: Decreased knowledge of precautions, impaired UE functional use, pain, decreased ROM, postural dysfunction.    PT treatment/interventions: ADL/Self care home management, Therapeutic exercises, Therapeutic activity, Neuromuscular re-education, Balance training, Gait training, Patient/Family education, and Joint mobilization         GOALS: Goals reviewed with patient? Yes   LONG TERM GOALS:  (STG=LTG)   GOALS Name Target Date Goal status  1 Pt will demonstrate she  has regained full shoulder ROM and function post operatively compared to baselines.  Baseline: 03/31/2022 MET for ROM except left extension,flexion 07/31/2022  2 Patient will increase bilateral shoulder flexion to be >/= 140 degrees for increased ease reaching overhead.   03/03/2022 MET 03/03/2022  3 Patient will increase bilateral shoulder abduction to be >/= 150 degrees for increased ability to obtain radiation positioning. 03/31/2022 MET  4 Patient will improve her DASH score to be </= 10 for improved overall UE function. 03/31/2022 Ongoing 41% today 18% on 03/26/22 22% 04/23/2022 18% 06/04/22 23% 08/01/2022  5 Pt will be able to perform sidelying abduction with proper scapular movement to limit impingement 04/23/22 Ongoing - still having pain with this as of 06/04/22  6 Pt will be independent in shoulder strengthening program 09/11/2022 RE-eval         PLAN: PT FREQUENCY/DURATION: 1x/week over 6 weeks for 4 visits    PLAN FOR NEXT SESSION: emphasize strength as tolerated, cont scapular movement and protraction/retraction work, ; cont  PROM, STM to Lt  axillary and periscapular area;  strength and flexibilityand use of UE's, Add reach to [place], PT 07/31/2022, 3:00 PM

## 2022-08-05 ENCOUNTER — Encounter: Payer: Self-pay | Admitting: Hematology

## 2022-08-05 ENCOUNTER — Ambulatory Visit
Admission: RE | Admit: 2022-08-05 | Discharge: 2022-08-05 | Disposition: A | Discharge status: Home / Self Care | Payer: BC Managed Care – PPO | Source: Ambulatory Visit | Attending: Internal Medicine | Admitting: Internal Medicine

## 2022-08-05 DIAGNOSIS — R911 Solitary pulmonary nodule: Secondary | ICD-10-CM

## 2022-08-08 ENCOUNTER — Ambulatory Visit (INDEPENDENT_AMBULATORY_CARE_PROVIDER_SITE_OTHER): Payer: BC Managed Care – PPO | Admitting: Primary Care

## 2022-08-08 ENCOUNTER — Encounter: Payer: Self-pay | Admitting: Primary Care

## 2022-08-08 VITALS — BP 128/74 | HR 81 | Temp 97.9°F | Ht 63.0 in | Wt 184.6 lb

## 2022-08-08 DIAGNOSIS — C50911 Malignant neoplasm of unspecified site of right female breast: Secondary | ICD-10-CM

## 2022-08-08 DIAGNOSIS — J479 Bronchiectasis, uncomplicated: Secondary | ICD-10-CM

## 2022-08-08 DIAGNOSIS — C50212 Malignant neoplasm of upper-inner quadrant of left female breast: Secondary | ICD-10-CM

## 2022-08-08 DIAGNOSIS — C50912 Malignant neoplasm of unspecified site of left female breast: Secondary | ICD-10-CM

## 2022-08-08 DIAGNOSIS — R918 Other nonspecific abnormal finding of lung field: Secondary | ICD-10-CM | POA: Diagnosis not present

## 2022-08-08 DIAGNOSIS — R911 Solitary pulmonary nodule: Secondary | ICD-10-CM

## 2022-08-08 DIAGNOSIS — Z170 Estrogen receptor positive status [ER+]: Secondary | ICD-10-CM

## 2022-08-08 NOTE — Assessment & Plan Note (Addendum)
-   Completed chemotherapy end of July 2023, feeling much better

## 2022-08-08 NOTE — Progress Notes (Signed)
$'@Patient'O$  ID: Erika Hodge, female    DOB: 1966-05-13, 56 y.o.   MRN: [id number]  Chief Complaint  Patient presents with   Follow-up    Referring provider: Bonnita Nasuti, MD  HPI: 56 year old female, smoker.  Past medical history significant for bronchiectasis, pulmonary nodules, breast cancer.  Patient of Dr. Shearon Stalls, seen for initial consult on 04/23/2022.  Previous [facility] pulmonary encounter: 04/23/22- Dr. Blythe Hodge is a 56 y.o. woman with a history of triple negative breast cancer s/p bilateral mastectomy in Feb 2023 (still on chemotherapy) who presents for new patient evaluation.   Has history of NHL s/p radiation.   She had a recent admission end of May for bronchiectasis with exacerbation in the setting of febrile neutropenia. She was treated with antibiotics and discharged home on oral agents. She is being sent here for further evaluation of an abnormal CT Chest which showed bilateral pulmonary nodules the largest of which was 6.5 mm in size.   Feeling more short of breath. She does have some wheezing and has an albuterol inhaler but she's not sure if it helps. She does think albuterol nebs help more. Denies coughing or chest tightness.    She has three more treatments left on her chemo.   No childhood respiratory disease. Or asthma.  She had bronchitis once 7 years ago.   Never hospitalized for breathing prior to this.   She wonders if she is allergic to the cat. She does take a claritin daily for seasonal allergies. 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 3 dogs and cat.  Smoking history: previously 0.5 ppd x 10 years = 5 pack years.   08/08/2022 Patient presents today for follow-up after CT imaging chest. She is doing well without acute respiratory complaints. No shortness of breath symptoms. She has an occasional dry  cough which is not significant or impacting her quality of life. Feels cough is from PND. She uses flonase and Claritin D as needed. Last round of chem was July 28th, 2023. Weight is stable. No hemoptysis.   Allergies  Allergen Reactions   Amoxicillin Rash and Other (See Comments)    Throat closing. Tolerates Cefepime   Ibuprofen Other (See Comments), Nausea And Vomiting and Nausea Only   Ciprofloxacin Other (See Comments)    Facial swelling and throat swelling   Codeine Nausea Only     There is no immunization history on file for this patient.  Past Medical History:  Diagnosis Date   Anxiety    Complication of anesthesia    HA   Dyspnea    due to chemo   GERD (gastroesophageal reflux disease)    Non Hodgkin's lymphoma ([facility])    Psoriasis     Tobacco History: Social History   Tobacco Use  Smoking Status Former   Packs/day: 0.50   Years: 15.00   Total pack years: 7.50   Types: Cigarettes   Quit date: 2016   Years since quitting: 7.7   Passive exposure: Never  Smokeless Tobacco Never   Counseling given: Not Answered   Outpatient Medications Prior to Visit  Medication Sig Dispense Refill   ALPRAZolam (XANAX) 0.5 MG tablet Take 0.5 mg by mouth daily as needed for anxiety or sleep.     ASHWAGANDHA PO Take 1 tablet by mouth daily.     FLUoxetine (PROZAC) 20 MG capsule  Take 20 mg by mouth daily.     loratadine (CLARITIN) 10 MG tablet Take 10 mg by mouth daily.     omeprazole (PRILOSEC) 40 MG capsule Take 40 mg by mouth daily.     rOPINIRole (REQUIP) 1 MG tablet Take 1 tablet (1 mg total) by mouth at bedtime. 30 tablet 0   VENTOLIN HFA 108 (90 Base) MCG/ACT inhaler TAKE 2 PUFFS BY MOUTH EVERY 6 HOURS AS NEEDED FOR WHEEZE OR SHORTNESS OF BREATH 18 each 2   zolpidem (AMBIEN) 10 MG tablet Take 10 mg by mouth at bedtime as needed for sleep.     acetaminophen (TYLENOL) 500 MG tablet Take 1,000 mg by mouth every 6 (six) hours as needed for mild pain or fever.     albuterol  (PROVENTIL) (2.5 MG/3ML) 0.083% nebulizer solution Take 3 mLs (2.5 mg total) by nebulization every 6 (six) hours as needed for wheezing or shortness of breath. 75 mL 6   rOPINIRole (REQUIP) 0.5 MG tablet TAKE 1 TABLET BY MOUTH EVERYDAY AT BEDTIME 30 tablet 0   No facility-administered medications prior to visit.   Review of Systems  Review of Systems  Constitutional: Negative.   HENT: Negative.    Respiratory:  Negative for chest tightness, shortness of breath and wheezing.        Occasional dry cough   Cardiovascular: Negative.     Physical Exam  BP 128/74 (BP Location: Left Arm, Patient Position: Sitting, Cuff Size: Normal)   Pulse 81   Temp 97.9 F (36.6 C) (Oral)   Ht '5\' 3"'$  (1.6 m)   Wt 184 lb 9.6 oz (83.7 kg)   SpO2 98%   BMI 32.70 kg/m  Physical Exam Constitutional:      Appearance: Normal appearance.  HENT:     Head: Normocephalic and atraumatic.  Cardiovascular:     Rate and Rhythm: Normal rate and regular rhythm.  Pulmonary:     Effort: Pulmonary effort is normal.     Breath sounds: Normal breath sounds. No wheezing, rhonchi or rales.  Musculoskeletal:        General: Normal range of motion.  Skin:    General: Skin is warm and dry.     Comments: Hair loss  Neurological:     General: No focal deficit present.     Mental Status: She is alert and oriented to person, place, and time. Mental status is at baseline.  Psychiatric:        Mood and Affect: Mood normal.        Behavior: Behavior normal.        Thought Content: Thought content normal.        Judgment: Judgment normal.      Lab Results:  CBC    Component Value Date/Time   WBC 5.8 07/09/2022 1306   WBC 4.3 04/09/2022 0349   RBC 3.93 07/09/2022 1306   HGB 11.4 (L) 07/09/2022 1306   HCT 36.3 07/09/2022 1306   PLT 405 (H) 07/09/2022 1306   MCV 92.4 07/09/2022 1306   MCH 29.0 07/09/2022 1306   MCHC 31.4 07/09/2022 1306   RDW 17.2 (H) 07/09/2022 1306   LYMPHSABS 1.3 07/09/2022 1306   MONOABS  0.9 07/09/2022 1306   EOSABS 0.5 07/09/2022 1306   BASOSABS 0.1 07/09/2022 1306    BMET    Component Value Date/Time   NA 139 07/09/2022 1306   K 4.4 07/09/2022 1306   CL 105 07/09/2022 1306   CO2 30 07/09/2022 1306  GLUCOSE 98 07/09/2022 1306   BUN 16 07/09/2022 1306   CREATININE 0.77 07/09/2022 1306   CALCIUM 9.9 07/09/2022 1306   GFRNONAA >60 07/09/2022 1306    BNP    Component Value Date/Time   BNP 38.6 04/07/2022 1312    ProBNP No results found for: "PROBNP"  Imaging: CT Chest Wo Contrast  Result Date: 08/06/2022 CLINICAL DATA:  Follow-up lung nodule history of breast cancer status post chemotherapy and bilateral mastectomy with reconstruction EXAM: CT CHEST WITHOUT CONTRAST TECHNIQUE: Multidetector CT imaging of the chest was performed following the standard protocol without IV contrast. RADIATION DOSE REDUCTION: This exam was performed according to the departmental dose-optimization program which includes automated exposure control, adjustment of the mA and/or kV according to patient size and/or use of iterative reconstruction technique. COMPARISON:  CT chest angiogram 04/07/2022 FINDINGS: Cardiovascular: Mitral annular and coronary artery atherosclerotic calcification. Trace pericardial effusion. Aortic atherosclerotic calcification. Mediastinum/Nodes: Unchanged exophytic partially calcified left thyroid nodule. No thoracic adenopathy by size. Small hiatal hernia. Lungs/Pleura: Architectural distortion and volume loss within the bilateral upper lobes medially likely due to radiation fibrosis. No focal consolidation, pleural effusion, or pneumothorax. Unchanged solid pulmonary nodules. For example: 7 x 6 mm pulmonary nodule in the posterior left lower lobe (series 5/image 46); 5 mm pulmonary nodule in the lateral right lower lobe (series 5/image 70); 5 mm nodule in the medial aspect of the lingula (series 5/image 65); 3 mm perifissural nodule along the left major fissure  (series 5/image 36). Upper Abdomen: Unremarkable. Musculoskeletal: Postoperative changes both breasts. Surgical clips left axilla. No acute osseous abnormality or aggressive osseous lesion. IMPRESSION: Stable pulmonary nodules as described. The largest again has a mean diameter of 6.5 mm. Given history of breast cancer, repeat CT in 1 year is recommended. Electronically Signed   By: Placido Sou M.D.   On: 08/06/2022 23:29     Assessment & Plan:   Pulmonary nodules - Hx breast cancer. CT chest wo contrast on 08/05/22 showed several small stable pulmonary nodules, largest measuring 6.11m in diameter which is unchanged. She also had some architectural distortion and volume loss to bilateral upper lobes likely related to previous radiation. Needs follow-up CT imaging in 1 year.   Bronchiectasis without complication ([facility]) - No active symptoms. Rare dry cough  Malignant neoplasm of upper-inner quadrant of left breast in female, estrogen receptor positive ([place] - Completed chemotherapy end of July 2023, feeling much better   Fu in 1 year or sooner if needed   EMartyn Ehrich NP 08/08/2022

## 2022-08-08 NOTE — Assessment & Plan Note (Signed)
-   No active symptoms. Rare dry cough

## 2022-08-08 NOTE — Patient Instructions (Signed)
CT chest in September showed stable pulmonary nodules, largest nodule measuring 6.62m in diameter which is unchanged.  Due to your history of breast cancer we do continue to recommend surveillance, you will need a repeat CT of your chest to monitor lung nodules in 1 year  If you develop any shortness of breath symptoms, hemoptysis (coughing up blood) or significant weight loss please notify our office  Orders: CT chest without contrast in 1 year   Follow-up: 1 year with Dr. DShearon Stallsor BEustaquio MaizeNP

## 2022-08-08 NOTE — Assessment & Plan Note (Addendum)
-   Hx breast cancer. CT chest wo contrast on 08/05/22 showed several small stable pulmonary nodules, largest measuring 6.27m in diameter which is unchanged. She also had some architectural distortion and volume loss to bilateral upper lobes likely related to previous radiation. Needs follow-up CT imaging in 1 year.

## 2022-08-20 ENCOUNTER — Encounter: Payer: Self-pay | Admitting: Hematology

## 2022-08-20 ENCOUNTER — Inpatient Hospital Stay: Payer: BC Managed Care – PPO | Attending: Hematology | Admitting: Hematology

## 2022-08-20 ENCOUNTER — Telehealth: Payer: BC Managed Care – PPO | Admitting: Hematology

## 2022-08-20 VITALS — BP 132/68 | HR 80 | Temp 97.6°F | Resp 18 | Ht 63.0 in | Wt 184.9 lb

## 2022-08-20 DIAGNOSIS — C50212 Malignant neoplasm of upper-inner quadrant of left female breast: Secondary | ICD-10-CM | POA: Insufficient documentation

## 2022-08-20 DIAGNOSIS — Z171 Estrogen receptor negative status [ER-]: Secondary | ICD-10-CM | POA: Insufficient documentation

## 2022-08-20 DIAGNOSIS — Z170 Estrogen receptor positive status [ER+]: Secondary | ICD-10-CM | POA: Diagnosis not present

## 2022-08-20 DIAGNOSIS — Z8572 Personal history of non-Hodgkin lymphomas: Secondary | ICD-10-CM | POA: Insufficient documentation

## 2022-08-20 DIAGNOSIS — Z79810 Long term (current) use of selective estrogen receptor modulators (SERMs): Secondary | ICD-10-CM | POA: Diagnosis not present

## 2022-08-20 DIAGNOSIS — C50911 Malignant neoplasm of unspecified site of right female breast: Secondary | ICD-10-CM | POA: Diagnosis not present

## 2022-08-20 MED ORDER — TAMOXIFEN CITRATE 10 MG PO TABS: 10.0000 mg | ORAL_TABLET | Freq: Every day | ORAL | 2 refills | Status: DC

## 2022-08-20 MED ORDER — VENLAFAXINE HCL ER 37.5 MG PO CP24: 37.5000 mg | ORAL_CAPSULE | Freq: Every day | ORAL | 2 refills | Status: DC

## 2022-08-20 NOTE — Progress Notes (Signed)
[place]   Telephone:([phone]) [phone] Fax:(336) [phone]   Clinic Follow up Note   Patient Care Team: Bonnita Nasuti, MD as PCP - General (Internal Medicine) Erroll Luna, MD as Consulting Physician (General Surgery) Truitt Merle, MD as Consulting Physician (Hematology) Gery Pray, MD as Consulting Physician (Radiation Oncology) Mauro Kaufmann, RN as Oncology Nurse Navigator Rockwell Germany, RN as Oncology Nurse Navigator  Date of Service:  08/20/2022  CHIEF COMPLAINT: f/u of bilateral breast cancers  CURRENT THERAPY:  To start tamoxifen 70m daily  ASSESSMENT & PLAN:  WMILDRED TUCCILLOis a 56y.o. post-menopausal female with   1. Invasive lobular carcinoma of right breast, stage I pT1c, ER weakly+/PR+/Her2-, and DCIS  -incidental finding on b/l mastectomies on 01/07/22 for left breast cancer. Pathology from right breast showed: 1.1 cm invasive lobular carcinoma, grade 1, in LIQ; 1 cm DCIS, intermediate grade, in UIQ. ILC showed ER 30% weakly positive, PR 80% moderately positive, Her2 negative. ILC focally involves adjacent anterior margin.  -we again discuss antiestrogen therapy today. I recommend she try low-dose tamoxifen (10 mg) first and increase to full dose 55mdaily if she tolerates well. I reassured her that only small percentage patients cannot tolerate tamoxifen due to side effects and have to switch to AI or discontinue. --The potential side effects, which includes but not limited to, hot flash, skin and vaginal dryness, slightly increased risk of cardiovascular disease and cataract, small risk of thrombosis and endometrial cancer, were discussed with her in great details. Preventive strategies for thrombosis, such as being physically active, using compression stocks, avoid cigarette smoking, etc., were reviewed with her. I also recommend her to follow-up with her gynecologist once a year, and watch for vaginal spotting or bleeding, as a clinically sign of  endometrial cancer, etc. She voiced good understanding, and agrees to proceed.  -she is currently on prozac, which has a potential interaction with tamoxifen. She agrees to switch to effexor; I prescribed today. -We also reviewed the breast cancer surveillance, including annual mammogram, and physical exam.   2. 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 -she opted to proceed with b/l mastectomies with reconstruction on 01/07/22 with Dr. CoBrantley Stagend Dr. ThIran PlanasPathology revealed b/l breast cancers. Left breast showed 2.1 cm invasive and in situ ductal carcinoma. Margins and lymph nodes negative (0/2). Repeated ER/PR/HER2 were all negative (ER 10% weak (+) on biopsy) -she developed left IMF necrotic tissue, excised by Dr. ThIran Planasn 01/27/22. 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  -we reviewed that adjuvant radiation is not recommended due to her previous radiation for lymphoma.   3. SOB, RLS -she is on inhaler per Dr. DeShearon Stallsor SOB and on requip for RLS.   4. 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 -per pt, she also received three years of systemic treatment. [facility] but she likely received adriamycin during that time.   5. Psoriasis  -previously on Tremfya injections, was told by dermatology that she could not restart due to h/o cancer. -she reports flare of psoriasis since coming off steroids while on chemo. I encouraged  her to discuss with her PCP and/or connect with a new dermatologist.     PLAN:  -start tamoxifen 55m daily, I called in today -change prozac to effexor, which  I prescribed today -survivorship visit 11/15, will consider increase tamoxifen to 55mdaily if she tolerates well  -lab and f/u in 5 months   No problem-specific Assessment & Plan notes found for this encounter.   SUMMARY OF ONCOLOGIC HISTORY: Oncology History Overview Note   Cancer Staging  Invasive lobular carcinoma of right breast, stage 1 (HCFultonStaging form: Breast, AJCC 8th Edition - Clinical stage from 01/07/2022: Stage Unknown (cT1c, cNX, cM0, G1, ER+, PR+, HER2-) - Signed by FeTruitt MerleMD on 01/24/2022  Malignant neoplasm of upper-inner quadrant of left breast in female, estrogen receptor positive (HCMullanStaging form: Breast, AJCC 8th Edition - Clinical stage from 11/20/2021: Stage IA (cT1c, cN0, cM0, G2, ER+, PR-, HER2-) - Signed by FeTruitt MerleMD on 11/26/2021 - Pathologic stage from 01/07/2022: Stage IIA (pT2, pN0, cM0, G2, ER-, PR-, HER2-) - Signed by FeTruitt MerleMD on 01/24/2022     Malignant neoplasm of upper-inner quadrant of left breast in female, estrogen receptor positive ([facility] [place] 11/05/2021 Mammogram   CLINICAL DATA:  Screening recall for a possible left breast asymmetry.   EXAM: DIGITAL DIAGNOSTIC UNILATERAL LEFT MAMMOGRAM WITH TOMOSYNTHESIS AND CAD; ULTRASOUND LEFT BREAST LIMITED  IMPRESSION: 1. Irregular 1.2 cm mass in the left breast at 11 o'clock, 2 cm the nipple, suspicious for malignancy.   11/20/2021 Cancer Staging   Staging form: Breast, AJCC 8th Edition - Clinical stage from 11/20/2021: Stage IA (cT1c, cN0, cM0, G2, ER+, PR-, HER2-) - Signed by FeTruitt MerleMD on 11/26/2021 Stage prefix: Initial diagnosis Histologic grading system: 3 grade system   11/20/2021 Initial Biopsy   Diagnosis Breast, left, needle core biopsy, 11 o'clock, ribbon clip - INVASIVE DUCTAL CARCINOMA - DUCTAL CARCINOMA IN SITU - SEE COMMENT Microscopic Comment based on the biopsy, the carcinoma appears Nottingham grade 2 of 3 and measures 0.6 cm in greatest linear extent.  PROGNOSTIC  INDICATORS Results: The tumor cells are NEGATIVE for Her2 (1+). Estrogen Receptor: 10%, POSITIVE, WEAK STAINING INTENSITY Progesterone Receptor: 0%, NEGATIVE Proliferation Marker Ki67: 20%   11/26/2021 Initial Diagnosis   Malignant neoplasm of upper-inner quadrant of left breast in female, estrogen receptor positive ([facility]  11/27/2021 Genetic Testing   [employer] Cancer[employer] was Negative. Report date is 12/11/2021.  The CancerNext gene panel offered by [employer]cludes sequencing, rearrangement analysis, and RNA analysis for the following 36 genes:   APC, ATM, AXIN2, BARD1, BMPR1A, BRCA1, BRCA2, BRIP1, CDH1, CDK4, CDKN2A, CHEK2, DICER1, HOXB13, EPCAM, GREM1, MLH1, MSH2, MSH3, MSH6, MUTYH, NBN, NF1, NTHL1, PALB2, PMS2, POLD1, POLE, PTEN, RAD51C, RAD51D, RECQL, SMAD4, SMARCA4, STK11, and TP53.    01/07/2022 Cancer Staging   Staging form: Breast, AJCC 8th Edition - Pathologic stage from 01/07/2022: Stage IIA (pT2, pN0, cM0, G2, ER-, PR-, HER2-) - Signed by FeTruitt MerleMD on 01/24/2022 Stage prefix: Initial diagnosis Histologic grading system: 3 grade system Residual tumor (R): R0 - None   01/07/2022 Definitive Surgery   FINAL MICROSCOPIC DIAGNOSIS:   A. BREAST, RIGHT NIPPLE, BIOPSY:  - Benign breast tissue.  - No malignancy identified.   B. BREAST, LEFT NIPPLE, BIOPSY:  - Benign breast tissue.  - No malignancy identified.   C. BREAST, RIGHT, MASTECTOMY:  - Invasive lobular carcinoma, 1.1 cm.  - Ductal carcinoma in situ, 1 cm.  - Invasive carcinoma focally extends to  the anterior margin.  - Fibrocystic changes with usual ductal hyperplasia and calcifications.  - Sclerosing adenosis and fibroadenomatoid change.  - See oncology table and comment.   D. LYMPH NODE, LEFT AXILLARY, SENTINEL, EXCISION:  - One lymph node negative for metastatic carcinoma (0/1).   E. LYMPH NODE, LEFT AXILLARY, SENTINEL, EXCISION:  - One lymph node negative for metastatic carcinoma (0/1).   F. BREAST, LEFT,  MASTECTOMY:  - Invasive and in situ ductal carcinoma, 2.1 cm.  - Margins negative for carcinoma.  - See oncology table.    01/07/2022 Receptors her2   F1. PROGNOSTIC INDICATOR RESULTS:   The tumor cells are NEGATIVE for Her2 (0).   Estrogen Receptor: NEGATIVE  Progesterone Receptor: NEGATIVE  Proliferation Marker Ki-67: 10%    02/12/2022 - 06/06/2022 Chemotherapy   Patient is on Treatment Plan : BREAST TC q21d     Invasive lobular carcinoma of right breast, stage 1 ([place])  11/27/2021 Genetic Testing   [facility] Cancer[employer] was Negative. Report date is 12/11/2021.  The CancerNext gene panel offered by [employer] includes sequencing, rearrangement analysis, and RNA analysis for the following 36 genes:   APC, ATM, AXIN2, BARD1, BMPR1A, BRCA1, BRCA2, BRIP1, CDH1, CDK4, CDKN2A, CHEK2, DICER1, HOXB13, EPCAM, GREM1, MLH1, MSH2, MSH3, MSH6, MUTYH, NBN, NF1, NTHL1, PALB2, PMS2, POLD1, POLE, PTEN, RAD51C, RAD51D, RECQL, SMAD4, SMARCA4, STK11, and TP53.    01/07/2022 Cancer Staging   Staging form: Breast, AJCC 8th Edition - Clinical stage from 01/07/2022: Stage Unknown (cT1c, cNX, cM0, G1, ER+, PR+, HER2-) - Signed by Truitt Merle, MD on 01/24/2022 Stage prefix: Initial diagnosis Histologic grading system: 3 grade system   01/07/2022 Definitive Surgery   FINAL MICROSCOPIC DIAGNOSIS:   A. BREAST, RIGHT NIPPLE, BIOPSY:  - Benign breast tissue.  - No malignancy identified.   B. BREAST, LEFT NIPPLE, BIOPSY:  - Benign breast tissue.  - No malignancy identified.   C. BREAST, RIGHT, MASTECTOMY:  - Invasive lobular carcinoma, 1.1 cm.  - Ductal carcinoma in situ, 1 cm.  - Invasive carcinoma focally extends to the anterior margin.  - Fibrocystic changes with usual ductal hyperplasia and calcifications.  - Sclerosing adenosis and fibroadenomatoid change.  - See oncology table and comment.   D. LYMPH NODE, LEFT AXILLARY, SENTINEL, EXCISION:  - One lymph node negative for metastatic carcinoma (0/1).   E.  LYMPH NODE, LEFT AXILLARY, SENTINEL, EXCISION:  - One lymph node negative for metastatic carcinoma (0/1).   F. BREAST, LEFT, MASTECTOMY:  - Invasive and in situ ductal carcinoma, 2.1 cm.  - Margins negative for carcinoma.  - See oncology table.    01/07/2022 Receptors her2   C7.  PROGNOSTIC INDICATOR RESULTS:   The tumor cells are NEGATIVE for Her2 (1+).   Estrogen Receptor: POSITIVE, 30% WEAK STAINING INTENSITY  Progesterone Receptor: POSITIVE, 80% MODERATE STAINING INTENSITY  Proliferation Marker Ki-67: <5%    01/24/2022 Initial Diagnosis   Invasive lobular carcinoma of right breast, stage 1 ([place])      INTERVAL HISTORY:  ANASTYN AYARS is here for a follow up of breast cancer. She was last seen by me on 07/09/22. She presents to the clinic accompanied by her husband. She reports she is slowly recovering, with her main residual side effect being fatigue. She notes this has improved, but she still only works part-time. She denies any tingling in her fingers. She notes her psoriasis improved while on steroids for chemo and has flared up since completing treatment.   All other systems were reviewed  with the patient and are negative.  MEDICAL HISTORY:  Past Medical History:  Diagnosis Date   Anxiety    Complication of anesthesia    HA   Dyspnea    due to chemo   GERD (gastroesophageal reflux disease)    Non Hodgkin's lymphoma ([place])    Psoriasis     SURGICAL HISTORY: Past Surgical History:  Procedure Laterality Date   BREAST BIOPSY Left 11/20/2021   BREAST RECONSTRUCTION WITH PLACEMENT OF TISSUE EXPANDER AND ALLODERM Bilateral 01/07/2022   Procedure: BILATERAL BREAST RECONSTRUCTION WITH PLACEMENT OF TISSUE EXPANDERS AND ALLODERM;  Surgeon: Irene Limbo, MD;  Location: [place];  Service: Plastics;  Laterality: Bilateral;   NIPPLE SPARING MASTECTOMY Right 01/07/2022   Procedure: RIGHT NIPPLE SPARING MASTECTOMY;  Surgeon: Erroll Luna, MD;  Location:  [place];  Service: General;  Laterality: Right;   NIPPLE SPARING MASTECTOMY WITH SENTINEL LYMPH NODE BIOPSY Left 01/07/2022   Procedure: LEFT NIPPLE SPARING MASTECTOMY WITH SENTINEL LYMPH NODE BIOPSY;  Surgeon: Erroll Luna, MD;  Location: [place];  Service: General;  Laterality: Left;   REMOVAL OF BILATERAL TISSUE EXPANDERS WITH PLACEMENT OF BILATERAL BREAST IMPLANTS Bilateral 07/15/2022   Procedure: REMOVAL OF BILATERAL TISSUE EXPANDERS WITH PLACEMENT OF BILATERAL BREAST IMPLANTS;  Surgeon: Irene Limbo, MD;  Location: [facility];  Service: Plastics;  Laterality: Bilateral;   TUBAL LIGATION      I have reviewed the social history and family history with the patient and they are unchanged from previous note.  ALLERGIES:  is allergic to amoxicillin, ibuprofen, ciprofloxacin, and codeine.  MEDICATIONS:  Current Outpatient Medications  Medication Sig Dispense Refill   tamoxifen (NOLVADEX) 10 MG tablet Take 1 tablet (10 mg total) by mouth daily. 30 tablet 2   venlafaxine XR (EFFEXOR XR) 37.5 MG 24 hr capsule Take 1 capsule (37.5 mg total) by mouth daily. 30 capsule 2   ALPRAZolam (XANAX) 0.5 MG tablet Take 0.5 mg by mouth daily as needed for anxiety or sleep.     ASHWAGANDHA PO Take 1 tablet by mouth daily.     loratadine (CLARITIN) 10 MG tablet Take 10 mg by mouth daily.     omeprazole (PRILOSEC) 40 MG capsule Take 40 mg by mouth daily.     rOPINIRole (REQUIP) 1 MG tablet Take 1 tablet (1 mg total) by mouth at bedtime. 30 tablet 0   VENTOLIN HFA 108 (90 Base) MCG/ACT inhaler TAKE 2 PUFFS BY MOUTH EVERY 6 HOURS AS NEEDED FOR WHEEZE OR SHORTNESS OF BREATH 18 each 2   zolpidem (AMBIEN) 10 MG tablet Take 10 mg by mouth at bedtime as needed for sleep.     No current facility-administered medications for this visit.    PHYSICAL EXAMINATION: ECOG PERFORMANCE STATUS: 1 - Symptomatic but completely ambulatory  Vitals:   08/20/22 1403  BP:  132/68  Pulse: 80  Resp: 18  Temp: 97.6 F (36.4 C)  SpO2: 99%   Wt Readings from Last 3 Encounters:  08/20/22 184 lb 14.4 oz (83.9 kg)  08/08/22 184 lb 9.6 oz (83.7 kg)  07/15/22 186 lb 4.6 oz (84.5 kg)     GENERAL:alert, no distress and comfortable SKIN: skin color normal, no rashes or significant lesions EYES: normal, Conjunctiva are pink and non-injected, sclera clear  NEURO: alert & oriented x 3 with fluent speech  LABORATORY DATA:  I have reviewed the data as listed    Latest Ref Rng & Units 07/09/2022    1:06  PM 06/06/2022   11:42 AM 05/16/2022    1:13 PM  CBC  WBC 4.0 - 10.5 K/uL 5.8  5.2  7.6   Hemoglobin 12.0 - 15.0 g/dL 11.4  9.6  9.7   Hematocrit 36.0 - 46.0 % 36.3  30.3  31.0   Platelets 150 - 400 K/uL 405  598  737         Latest Ref Rng & Units 07/09/2022    1:06 PM 06/06/2022   11:42 AM 05/16/2022    1:13 PM  CMP  Glucose 70 - 99 mg/dL 98  92  105   BUN 6 - 20 mg/dL 16  15  14    Creatinine 0.44 - 1.00 mg/dL 0.77  0.76  0.69   Sodium 135 - 145 mmol/L 139  138  138   Potassium 3.5 - 5.1 mmol/L 4.4  4.2  3.8   Chloride 98 - 111 mmol/L 105  105  104   CO2 22 - 32 mmol/L 30  27  26    Calcium 8.9 - 10.3 mg/dL 9.9  9.3  10.0   Total Protein 6.5 - 8.1 g/dL 7.1  7.1  7.6   Total Bilirubin 0.3 - 1.2 mg/dL 0.3  0.3  0.2   Alkaline Phos 38 - 126 U/L 59  79  87   AST 15 - 41 U/L 22  16  11    ALT 0 - 44 U/L 17  12  10        RADIOGRAPHIC STUDIES: I have personally reviewed the radiological images as listed and agreed with the findings in the report. No results found.    No orders of the defined types were placed in this encounter.  All questions were answered. The patient knows to call the clinic with any problems, questions or concerns. No barriers to learning was detected. The total time spent in the appointment was 30 minutes.     Truitt Merle, MD 08/20/2022   I, Wilburn Mylar, am acting as scribe for Truitt Merle, MD.   I have reviewed the above  documentation for accuracy and completeness, and I agree with the above.

## 2022-08-21 ENCOUNTER — Telehealth: Payer: Self-pay | Admitting: Hematology

## 2022-08-21 NOTE — Telephone Encounter (Signed)
Spoke with patient confirming upcoming appointments  

## 2022-08-29 ENCOUNTER — Other Ambulatory Visit: Payer: Self-pay | Admitting: Hematology

## 2022-09-12 ENCOUNTER — Other Ambulatory Visit: Payer: Self-pay | Admitting: Hematology

## 2022-09-19 ENCOUNTER — Other Ambulatory Visit: Payer: Self-pay | Admitting: Hematology

## 2022-09-19 ENCOUNTER — Encounter: Payer: Self-pay | Admitting: Hematology

## 2022-09-19 DIAGNOSIS — Z170 Estrogen receptor positive status [ER+]: Secondary | ICD-10-CM

## 2022-09-22 ENCOUNTER — Other Ambulatory Visit: Payer: Self-pay

## 2022-09-22 ENCOUNTER — Ambulatory Visit: Payer: BC Managed Care – PPO | Attending: Surgery

## 2022-09-22 VITALS — Wt 183.4 lb

## 2022-09-22 DIAGNOSIS — Z483 Aftercare following surgery for neoplasm: Secondary | ICD-10-CM | POA: Insufficient documentation

## 2022-09-22 MED ORDER — VENLAFAXINE HCL ER 75 MG PO CP24: 75.0000 mg | ORAL_CAPSULE | Freq: Every day | ORAL | 1 refills | Status: DC

## 2022-09-22 NOTE — Therapy (Signed)
OUTPATIENT PHYSICAL THERAPY [facility] SCREENING NOTE   Patient Name: Erika Hodge MRN: [id number] DOB:[birth year], 56 y.o., female Today's Date: 09/22/2022  PCP: Bonnita Nasuti, MD REFERRING PROVIDER: Erroll Luna, MD   PT End of Session - 09/22/22 1525     Visit Number 22   # unchanged due to screen only   PT Start Time 1522    PT Stop Time 1526    PT Time Calculation (min) 4 min    Activity Tolerance Patient tolerated treatment well    Behavior During Therapy WFL for tasks assessed/performed             Past Medical History:  Diagnosis Date   Anxiety    Complication of anesthesia    HA   Dyspnea    due to chemo   GERD (gastroesophageal reflux disease)    Non Hodgkin's lymphoma ([place])    Psoriasis    Past Surgical History:  Procedure Laterality Date   BREAST BIOPSY Left 11/20/2021   BREAST RECONSTRUCTION WITH PLACEMENT OF TISSUE EXPANDER AND ALLODERM Bilateral 01/07/2022   Procedure: BILATERAL BREAST RECONSTRUCTION WITH PLACEMENT OF TISSUE EXPANDERS AND ALLODERM;  Surgeon: Irene Limbo, MD;  Location: [facility];  Service: Plastics;  Laterality: Bilateral;   NIPPLE SPARING MASTECTOMY Right 01/07/2022   Procedure: RIGHT NIPPLE SPARING MASTECTOMY;  Surgeon: Erroll Luna, MD;  Location: [facility];  Service: General;  Laterality: Right;   NIPPLE SPARING MASTECTOMY WITH SENTINEL LYMPH NODE BIOPSY Left 01/07/2022   Procedure: LEFT NIPPLE SPARING MASTECTOMY WITH SENTINEL LYMPH NODE BIOPSY;  Surgeon: Erroll Luna, MD;  Location: [place];  Service: General;  Laterality: Left;   REMOVAL OF BILATERAL TISSUE EXPANDERS WITH PLACEMENT OF BILATERAL BREAST IMPLANTS Bilateral 07/15/2022   Procedure: REMOVAL OF BILATERAL TISSUE EXPANDERS WITH PLACEMENT OF BILATERAL BREAST IMPLANTS;  Surgeon: Irene Limbo, MD;  Location: [facility];  Service: Plastics;  Laterality: Bilateral;   TUBAL LIGATION     Patient Active  Problem List   Diagnosis Date Noted   Hypokalemia 04/08/2022   Anemia of chronic disease 04/08/2022   Restless leg syndrome 04/08/2022   Obesity (BMI 30-39.9) 04/08/2022   Bronchiectasis without complication ([place]) 24/40/1027   Neutropenic fever ([place]) 04/07/2022   Pulmonary nodules 04/07/2022   Anxiety    Hiatal hernia with GERD    Invasive lobular carcinoma of right breast, stage 1 ([place]) 01/24/2022   Genetic testing 12/11/2021   Family history of breast cancer 11/29/2021   Malignant neoplasm of upper-inner quadrant of left breast in female, estrogen receptor positive ([place]) 11/26/2021    REFERRING DIAG: left breast cancer at risk for lymphedema  THERAPY DIAG:  Aftercare following surgery for neoplasm  PERTINENT HISTORY: Patient was diagnosed on 09/10/2021 with 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 Two negative axillary nodes were removed on the left side. An incidental finding of right breast invasive lobular carcinoma was also found and is ER/PR positive and HER2 negative. It is weakly ER positive, PR negative and HER2 negative with a Ki67 of 20%. She has a left wrist plate from a previous surgery in 2006 and a history of non-Hodgkins Lymphoma in 1985.   PRECAUTIONS: left UE Lymphedema risk, None  SUBJECTIVE: Pt returns for her 3 month L-Dex screen only.   PAIN:  Are you having pain? No  SOZO SCREENING: Patient was assessed today using the SOZO machine  to determine the lymphedema index score. This was compared to her baseline score. It was determined that she is within the recommended range when compared to her baseline and no further action is needed at this time. She will continue SOZO screenings. These are done every 3 months for 2 years post operatively followed by every 6 months for 2 years, and then annually.   L-DEX FLOWSHEETS - 09/22/22 1500        L-DEX LYMPHEDEMA SCREENING   Measurement Type Unilateral    L-DEX MEASUREMENT EXTREMITY Upper Extremity    POSITION  Standing    DOMINANT SIDE Right    At Risk Side Left    BASELINE SCORE (UNILATERAL) 6.4    L-DEX SCORE (UNILATERAL) 4.9    VALUE CHANGE (UNILAT) -1.5               Otelia Limes, PTA 09/22/2022, 3:27 PM

## 2022-09-24 ENCOUNTER — Encounter: Payer: BC Managed Care – PPO | Admitting: Nurse Practitioner

## 2022-09-24 ENCOUNTER — Inpatient Hospital Stay: Payer: BC Managed Care – PPO

## 2022-09-27 ENCOUNTER — Other Ambulatory Visit: Payer: Self-pay | Admitting: Hematology

## 2022-09-30 ENCOUNTER — Inpatient Hospital Stay: Payer: BC Managed Care – PPO | Attending: Hematology

## 2022-09-30 ENCOUNTER — Encounter: Payer: Self-pay | Admitting: Adult Health

## 2022-09-30 ENCOUNTER — Inpatient Hospital Stay (HOSPITAL_BASED_OUTPATIENT_CLINIC_OR_DEPARTMENT_OTHER): Payer: BC Managed Care – PPO | Admitting: Adult Health

## 2022-09-30 VITALS — BP 146/85 | HR 84 | Temp 97.6°F | Resp 16 | Ht 63.0 in | Wt 182.0 lb

## 2022-09-30 DIAGNOSIS — Z79810 Long term (current) use of selective estrogen receptor modulators (SERMs): Secondary | ICD-10-CM | POA: Diagnosis not present

## 2022-09-30 DIAGNOSIS — C50919 Malignant neoplasm of unspecified site of unspecified female breast: Secondary | ICD-10-CM

## 2022-09-30 DIAGNOSIS — Z170 Estrogen receptor positive status [ER+]: Secondary | ICD-10-CM | POA: Insufficient documentation

## 2022-09-30 DIAGNOSIS — C50212 Malignant neoplasm of upper-inner quadrant of left female breast: Secondary | ICD-10-CM | POA: Insufficient documentation

## 2022-09-30 LAB — CMP (CANCER CENTER ONLY)
ALT: 19 U/L (ref 0–44)
AST: 22 U/L (ref 15–41)
Albumin: 4.5 g/dL (ref 3.5–5.0)
Alkaline Phosphatase: 62 U/L (ref 38–126)
Anion gap: 6 (ref 5–15)
BUN: 16 mg/dL (ref 6–20)
CO2: 26 mmol/L (ref 22–32)
Calcium: 10.2 mg/dL (ref 8.9–10.3)
Chloride: 104 mmol/L (ref 98–111)
Creatinine: 0.8 mg/dL (ref 0.44–1.00)
GFR, Estimated: >60 mL/min (ref >60)
Glucose, Bld: 112 mg/dL — ABNORMAL HIGH (ref 70–99)
Potassium: 4.1 mmol/L (ref 3.5–5.1)
Sodium: 136 mmol/L (ref 135–145)
Total Bilirubin: 0.2 mg/dL — ABNORMAL LOW (ref 0.3–1.2)
Total Protein: 7.5 g/dL (ref 6.5–8.1)

## 2022-09-30 LAB — CBC WITH DIFFERENTIAL (CANCER CENTER ONLY)
Abs Immature Granulocytes: 0.01 10*3/uL (ref 0.00–0.07)
Basophils Absolute: 0.1 10*3/uL (ref 0.0–0.1)
Basophils Relative: 2 %
Eosinophils Absolute: 0.3 10*3/uL (ref 0.0–0.5)
Eosinophils Relative: 5 %
HCT: 41.8 % (ref 36.0–46.0)
Hemoglobin: 13.6 g/dL (ref 12.0–15.0)
Immature Granulocytes: 0 %
Lymphocytes Relative: 27 %
Lymphs Abs: 1.6 10*3/uL (ref 0.7–4.0)
MCH: 29 pg (ref 26.0–34.0)
MCHC: 32.5 g/dL (ref 30.0–36.0)
MCV: 89.1 fL (ref 80.0–100.0)
Monocytes Absolute: 0.6 10*3/uL (ref 0.1–1.0)
Monocytes Relative: 10 %
Neutro Abs: 3.4 10*3/uL (ref 1.7–7.7)
Neutrophils Relative %: 56 %
Platelet Count: 343 10*3/uL (ref 150–400)
RBC: 4.69 MIL/uL (ref 3.87–5.11)
RDW: 14.4 % (ref 11.5–15.5)
WBC Count: 6 10*3/uL (ref 4.0–10.5)
nRBC: 0 % (ref 0.0–0.2)

## 2022-09-30 NOTE — Progress Notes (Signed)
SURVIVORSHIP VISIT:   BRIEF ONCOLOGIC HISTORY:  Oncology History Overview Note   Cancer Staging  Invasive lobular carcinoma of right breast, stage 1 ([place]) Staging form: Breast, AJCC 8th Edition - Clinical stage from 01/07/2022: Stage Unknown (cT1c, cNX, cM0, G1, ER+, PR+, HER2-) - Signed by Truitt Merle, MD on 01/24/2022  Malignant neoplasm of upper-inner quadrant of left breast in female, estrogen receptor positive ([place]) Staging form: Breast, AJCC 8th Edition - Clinical stage from 11/20/2021: Stage IA (cT1c, cN0, cM0, G2, ER+, PR-, HER2-) - Signed by Truitt Merle, MD on 11/26/2021 - Pathologic stage from 01/07/2022: Stage IIA (pT2, pN0, cM0, G2, ER-, PR-, HER2-) - Signed by Truitt Merle, MD on 01/24/2022     Malignant neoplasm of upper-inner quadrant of left breast in female, estrogen receptor positive ([place])  11/05/2021 Mammogram   CLINICAL DATA:  Screening recall for a possible left breast asymmetry.   EXAM: DIGITAL DIAGNOSTIC UNILATERAL LEFT MAMMOGRAM WITH TOMOSYNTHESIS AND CAD; ULTRASOUND LEFT BREAST LIMITED  IMPRESSION: 1. Irregular 1.2 cm mass in the left breast at 11 o'clock, 2 cm the nipple, suspicious for malignancy.   11/20/2021 Cancer Staging   Staging form: Breast, AJCC 8th Edition - Clinical stage from 11/20/2021: Stage IA (cT1c, cN0, cM0, G2, ER+, PR-, HER2-) - Signed by Truitt Merle, MD on 11/26/2021 Stage prefix: Initial diagnosis Histologic grading system: 3 grade system   11/20/2021 Initial Biopsy   Diagnosis Breast, left, needle core biopsy, 11 o'clock, ribbon clip - INVASIVE DUCTAL CARCINOMA - DUCTAL CARCINOMA IN SITU - SEE COMMENT Microscopic Comment based on the biopsy, the carcinoma appears Nottingham grade 2 of 3 and measures 0.6 cm in greatest linear extent.  PROGNOSTIC INDICATORS Results: The tumor cells are NEGATIVE for Her2 (1+). Estrogen Receptor: 10%, POSITIVE, WEAK STAINING INTENSITY Progesterone Receptor: 0%, NEGATIVE Proliferation Marker Ki67: 20%    11/26/2021 Initial Diagnosis   Malignant neoplasm of upper-inner quadrant of left breast in female, estrogen receptor positive ([place])   11/27/2021 Genetic Testing   [facility] Cancer[facility] was Negative. Report date is 12/11/2021.  The CancerNext gene panel offered by [employer] includes sequencing, rearrangement analysis, and RNA analysis for the following 36 genes:   APC, ATM, AXIN2, BARD1, BMPR1A, BRCA1, BRCA2, BRIP1, CDH1, CDK4, CDKN2A, CHEK2, DICER1, HOXB13, EPCAM, GREM1, MLH1, MSH2, MSH3, MSH6, MUTYH, NBN, NF1, NTHL1, PALB2, PMS2, POLD1, POLE, PTEN, RAD51C, RAD51D, RECQL, SMAD4, SMARCA4, STK11, and TP53.    01/07/2022 Cancer Staging   Staging form: Breast, AJCC 8th Edition - Pathologic stage from 01/07/2022: Stage IIA (pT2, pN0, cM0, G2, ER-, PR-, HER2-) - Signed by Truitt Merle, MD on 01/24/2022 Stage prefix: Initial diagnosis Histologic grading system: 3 grade system Residual tumor (R): R0 - None   01/07/2022 Definitive Surgery   FINAL MICROSCOPIC DIAGNOSIS:   A. BREAST, RIGHT NIPPLE, BIOPSY:  - Benign breast tissue.  - No malignancy identified.   B. BREAST, LEFT NIPPLE, BIOPSY:  - Benign breast tissue.  - No malignancy identified.   C. BREAST, RIGHT, MASTECTOMY:  - Invasive lobular carcinoma, 1.1 cm.  - Ductal carcinoma in situ, 1 cm.  - Invasive carcinoma focally extends to the anterior margin.  - Fibrocystic changes with usual ductal hyperplasia and calcifications.  - Sclerosing adenosis and fibroadenomatoid change.  - See oncology table and comment.   D. LYMPH NODE, LEFT AXILLARY, SENTINEL, EXCISION:  - One lymph node negative for metastatic carcinoma (0/1).   E. LYMPH NODE, LEFT AXILLARY, SENTINEL, EXCISION:  - One lymph node negative for metastatic carcinoma (0/1).  F. BREAST, LEFT, MASTECTOMY:  - Invasive and in situ ductal carcinoma, 2.1 cm.  - Margins negative for carcinoma.  - See oncology table.    01/07/2022 Receptors her2   F1. PROGNOSTIC INDICATOR RESULTS:    The tumor cells are NEGATIVE for Her2 (0).   Estrogen Receptor: NEGATIVE  Progesterone Receptor: NEGATIVE  Proliferation Marker Ki-67: 10%    02/12/2022 - 06/06/2022 Chemotherapy   Patient is on Treatment Plan : BREAST TC q21d     08/2022 -  Anti-estrogen oral therapy   Tamoxifen   Invasive lobular carcinoma of right breast, stage 1 ([place])  11/27/2021 Genetic Testing   [facility] was Negative. Report date is 12/11/2021.  The CancerNext gene panel offered by [employer] includes sequencing, rearrangement analysis, and RNA analysis for the following 36 genes:   APC, ATM, AXIN2, BARD1, BMPR1A, BRCA1, BRCA2, BRIP1, CDH1, CDK4, CDKN2A, CHEK2, DICER1, HOXB13, EPCAM, GREM1, MLH1, MSH2, MSH3, MSH6, MUTYH, NBN, NF1, NTHL1, PALB2, PMS2, POLD1, POLE, PTEN, RAD51C, RAD51D, RECQL, SMAD4, SMARCA4, STK11, and TP53.    01/07/2022 Cancer Staging   Staging form: Breast, AJCC 8th Edition - Clinical stage from 01/07/2022: Stage Unknown (cT1c, cNX, cM0, G1, ER+, PR+, HER2-) - Signed by Truitt Merle, MD on 01/24/2022 Stage prefix: Initial diagnosis Histologic grading system: 3 grade system   01/07/2022 Definitive Surgery   FINAL MICROSCOPIC DIAGNOSIS:   A. BREAST, RIGHT NIPPLE, BIOPSY:  - Benign breast tissue.  - No malignancy identified.   B. BREAST, LEFT NIPPLE, BIOPSY:  - Benign breast tissue.  - No malignancy identified.   C. BREAST, RIGHT, MASTECTOMY:  - Invasive lobular carcinoma, 1.1 cm.  - Ductal carcinoma in situ, 1 cm.  - Invasive carcinoma focally extends to the anterior margin.  - Fibrocystic changes with usual ductal hyperplasia and calcifications.  - Sclerosing adenosis and fibroadenomatoid change.  - See oncology table and comment.   D. LYMPH NODE, LEFT AXILLARY, SENTINEL, EXCISION:  - One lymph node negative for metastatic carcinoma (0/1).   E. LYMPH NODE, LEFT AXILLARY, SENTINEL, EXCISION:  - One lymph node negative for metastatic carcinoma (0/1).   F. BREAST, LEFT,  MASTECTOMY:  - Invasive and in situ ductal carcinoma, 2.1 cm.  - Margins negative for carcinoma.  - See oncology table.    01/07/2022 Receptors her2   C7.  PROGNOSTIC INDICATOR RESULTS:   The tumor cells are NEGATIVE for Her2 (1+).   Estrogen Receptor: POSITIVE, 30% WEAK STAINING INTENSITY  Progesterone Receptor: POSITIVE, 80% MODERATE STAINING INTENSITY  Proliferation Marker Ki-67: <5%    01/24/2022 Initial Diagnosis   Invasive lobular carcinoma of right breast, stage 1 ([place])   08/2022 -  Anti-estrogen oral therapy   Tamoxifen     INTERVAL HISTORY:  Erika Hodge to review her survivorship care plan detailing her treatment course for breast cancer, as well as monitoring long-term side effects of that treatment, education regarding health maintenance, screening, and overall wellness and health promotion.     Overall, Erika Hodge reports feeling quite well.  She is taking Tamoxifen daily and tells me she doesn't like it.  She has noted a mood change and fatigue since starting it.    REVIEW OF SYSTEMS:  Review of Systems  Constitutional:  Positive for fatigue. Negative for appetite change, chills, fever and unexpected weight change.  HENT:   Negative for hearing loss, lump/mass and trouble swallowing.   Eyes:  Negative for eye problems and icterus.  Respiratory:  Negative for chest tightness, cough and shortness of  breath.   Cardiovascular:  Negative for chest pain, leg swelling and palpitations.  Gastrointestinal:  Negative for abdominal distention, abdominal pain, constipation, diarrhea, nausea and vomiting.  Endocrine: Negative for hot flashes.  Genitourinary:  Negative for difficulty urinating.   Musculoskeletal:  Negative for arthralgias.  Skin:  Negative for itching and rash.  Neurological:  Negative for dizziness, extremity weakness, headaches and numbness.  Hematological:  Negative for adenopathy. Does not bruise/bleed easily.  Psychiatric/Behavioral:  Negative for  depression. The patient is not nervous/anxious.    Breast: Denies any new nodularity, masses, tenderness, nipple changes, or nipple discharge.      ONCOLOGY TREATMENT TEAM:  1. Surgeon:  Dr. Brantley Stage at [facility] 2. Medical Oncologist: Dr. Burr Medico  3. Radiation Oncologist: Dr. Sondra Come    PAST MEDICAL/SURGICAL HISTORY:  Past Medical History:  Diagnosis Date   Anxiety    Complication of anesthesia    HA   Dyspnea    due to chemo   GERD (gastroesophageal reflux disease)    Non Hodgkin's lymphoma ([place])    Psoriasis    Past Surgical History:  Procedure Laterality Date   BREAST BIOPSY Left 11/20/2021   BREAST RECONSTRUCTION WITH PLACEMENT OF TISSUE EXPANDER AND ALLODERM Bilateral 01/07/2022   Procedure: BILATERAL BREAST RECONSTRUCTION WITH PLACEMENT OF TISSUE EXPANDERS AND ALLODERM;  Surgeon: Irene Limbo, MD;  Location: [facility];  Service: Plastics;  Laterality: Bilateral;   NIPPLE SPARING MASTECTOMY Right 01/07/2022   Procedure: RIGHT NIPPLE SPARING MASTECTOMY;  Surgeon: Erroll Luna, MD;  Location: [facility];  Service: General;  Laterality: Right;   NIPPLE SPARING MASTECTOMY WITH SENTINEL LYMPH NODE BIOPSY Left 01/07/2022   Procedure: LEFT NIPPLE SPARING MASTECTOMY WITH SENTINEL LYMPH NODE BIOPSY;  Surgeon: Erroll Luna, MD;  Location: [facility];  Service: General;  Laterality: Left;   REMOVAL OF BILATERAL TISSUE EXPANDERS WITH PLACEMENT OF BILATERAL BREAST IMPLANTS Bilateral 07/15/2022   Procedure: REMOVAL OF BILATERAL TISSUE EXPANDERS WITH PLACEMENT OF BILATERAL BREAST IMPLANTS;  Surgeon: Irene Limbo, MD;  Location: [facility];  Service: Plastics;  Laterality: Bilateral;   TUBAL LIGATION       ALLERGIES:  Allergies  Allergen Reactions   Amoxicillin Rash and Other (See Comments)    Throat closing. Tolerates Cefepime   Ibuprofen Other (See Comments), Nausea And Vomiting and Nausea Only    Ciprofloxacin Other (See Comments)    Facial swelling and throat swelling   Codeine Nausea Only     CURRENT MEDICATIONS:  Outpatient Encounter Medications as of 09/30/2022  Medication Sig   ALPRAZolam (XANAX) 0.5 MG tablet Take 0.5 mg by mouth daily as needed for anxiety or sleep.   loratadine (CLARITIN) 10 MG tablet Take 10 mg by mouth daily.   omeprazole (PRILOSEC) 40 MG capsule Take 40 mg by mouth daily.   rOPINIRole (REQUIP) 0.5 MG tablet TAKE 1 TABLET BY MOUTH EVERYDAY AT BEDTIME   tamoxifen (NOLVADEX) 10 MG tablet Take 1 tablet (10 mg total) by mouth daily.   venlafaxine XR (EFFEXOR-XR) 75 MG 24 hr capsule Take 1 capsule (75 mg total) by mouth daily with breakfast.   VENTOLIN HFA 108 (90 Base) MCG/ACT inhaler TAKE 2 PUFFS BY MOUTH EVERY 6 HOURS AS NEEDED FOR WHEEZE OR SHORTNESS OF BREATH   zolpidem (AMBIEN) 10 MG tablet Take 10 mg by mouth at bedtime as needed for sleep.   ASHWAGANDHA PO Take 1 tablet by mouth daily.   rOPINIRole (REQUIP) 1 MG tablet Take 1 tablet (  1 mg total) by mouth at bedtime.   [DISCONTINUED] prochlorperazine (COMPAZINE) 10 MG tablet Take 1 tablet (10 mg total) by mouth every 6 (six) hours as needed (Nausea or vomiting).   No facility-administered encounter medications on file as of 09/30/2022.     ONCOLOGIC FAMILY HISTORY:  Family History  Problem Relation Age of Onset   Breast cancer Mother        dx. 32s, metastasized   Throat cancer Father        dx. 55s   Cancer Maternal Uncle        unknown type   Lung disease Neg Hx       SOCIAL HISTORY:  Social History   Socioeconomic History   Marital status: Married    Spouse name: Not on file   Number of children: 3   Years of education: Not on file   Highest education level: Not on file  Occupational History   Not on file  Tobacco Use   Smoking status: Former    Packs/day: 0.50    Years: 15.00    Total pack years: 7.50    Types: Cigarettes    Quit date: 2016    Years since quitting: 7.8     Passive exposure: Never   Smokeless tobacco: Never  Substance and Sexual Activity   Alcohol use: Yes    Comment: occas   Drug use: Never   Sexual activity: Yes    Birth control/protection: Surgical, Post-menopausal  Other Topics Concern   Not on file  Social History Narrative   Not on file   Social Determinants of Health   Financial Resource Strain: Low Risk  (11/27/2021)   Overall Financial Resource Strain (CARDIA)    Difficulty of Paying Living Expenses: Not hard at all  Food Insecurity: No Food Insecurity (11/27/2021)   Hunger Vital Sign    Worried About Running Out of Food in the Last Year: Never true    [place] in the Last Year: Never true  Transportation Needs: No Transportation Needs (11/27/2021)   PRAPARE - Hydrologist (Medical): No    Lack of Transportation (Non-Medical): No  Physical Activity: Not on file  Stress: Not on file  Social Connections: Not on file  Intimate Partner Violence: Not on file     OBSERVATIONS/OBJECTIVE:  BP (!) 146/85 (BP Location: Left Arm, Patient Position: Sitting)   Pulse 84   Temp 97.6 F (36.4 C) (Temporal)   Resp 16   Ht _0  (1.6 m)   Wt 182 lb (82.6 kg)   SpO2 97%   BMI 32.24 kg/m  GENERAL: Patient is a well appearing female in no acute distress HEENT:  Sclerae anicteric.  Oropharynx clear and moist. No ulcerations or evidence of oropharyngeal candidiasis. Neck is supple.  NODES:  No cervical, supraclavicular, or axillary lymphadenopathy palpated.  BREAST EXAM:  Deferred. LUNGS:  Clear to auscultation bilaterally.  No wheezes or rhonchi. HEART:  Regular rate and rhythm. No murmur appreciated. ABDOMEN:  Soft, nontender.  Positive, normoactive bowel sounds. No organomegaly palpated. MSK:  No focal spinal tenderness to palpation. Full range of motion bilaterally in the upper extremities. EXTREMITIES:  No peripheral edema.   SKIN:  Clear with no obvious rashes or skin changes. No nail  dyscrasia. NEURO:  Nonfocal. Well oriented.  Appropriate affect.   LABORATORY DATA:  None for this visit.  DIAGNOSTIC IMAGING:  None for this visit.      ASSESSMENT AND  PLAN:  Ms.. Hodge is a pleasant 56 y.o. female with bilateral breast cancer, stage IA ER positive on the right, stage IIA triple negative on th left, diagnosed in 11/2021, treated with bilateral mastectomies, adjuvant chemotherapy and Tamoxifen beginning in 08/2022.  She presents to the Survivorship Clinic for our initial meeting and routine follow-up post-completion of treatment for breast cancer.    1. Bilateral breast cancer:  Erika Hodge is continuing to recover from definitive treatment for breast cancer. She will follow-up with her medical oncologist, Dr. Burr Medico in 01/2023  with history and physical exam per surveillance protocol.  She will continue her anti-estrogen therapy with Tamoxifen. Thus far, she is tolerating the Tamoxifen well, with mild side effects that will likely improve as she continues to adjust to the medication. Today, a comprehensive survivorship care plan and treatment summary was reviewed with the patient today detailing her breast cancer diagnosis, treatment course, potential late/long-term effects of treatment, appropriate follow-up care with recommendations for the future, and patient education resources.  A copy of this summary, along with a letter will be sent to the patient's primary care provider via mail/fax/In Basket message after today's visit.    2. Bone health:  She was given education on specific activities to promote bone health.  3. Cancer screening:  Due to Erika Hodge history and her age, she should receive screening for skin cancers, colon cancer, and gynecologic cancers.  The information and recommendations are listed on the patient's comprehensive care plan/treatment summary and were reviewed in detail with the patient.    4. Health maintenance and wellness promotion: Ms.  Hodge was encouraged to consume 5-7 servings of fruits and vegetables per day. We reviewed the "Nutrition Rainbow" handout.  She was also encouraged to engage in moderate to vigorous exercise for 30 minutes per day most days of the week. We discussed the [employer] fitness program, which is designed for cancer survivors to help them become more physically fit after cancer treatments.  She was instructed to limit her alcohol consumption and continue to abstain from tobacco use.     5. Support services/counseling: It is not uncommon for this period of the patient's cancer care trajectory to be one of many emotions and stressors.  She was given information regarding our available services and encouraged to contact me with any questions or for help enrolling in any of our support group/programs.    Follow up instructions:    -Return to cancer center 01/2023  -She is welcome to return back to the Survivorship Clinic at any time; no additional follow-up needed at this time.  -Consider referral back to survivorship as a long-term survivor for continued surveillance  The patient was provided an opportunity to ask questions and all were answered. The patient agreed with the plan and demonstrated an understanding of the instructions.   Total encounter time:30 minutes*in face-to-face visit time, chart review, lab review, care coordination, order entry, and documentation of the encounter time.    Wilber Bihari, NP 09/30/22 2:56 PM Medical Oncology and Hematology [facility] [place], [place] [address] Tel. [phone]    Fax. [phone]  *Total Encounter Time as defined by the Centers for Medicare and Medicaid Services includes, in addition to the face-to-face time of a patient visit (documented in the note above) non-face-to-face time: obtaining and reviewing outside history, ordering and reviewing medications, tests or procedures, care coordination (communications  with other health care professionals or caregivers) and documentation in the medical  record.

## 2022-10-01 ENCOUNTER — Other Ambulatory Visit: Payer: Self-pay | Admitting: Internal Medicine

## 2022-10-01 LAB — FOLLICLE STIMULATING HORMONE: FSH: 55.2 m[IU]/mL

## 2022-10-21 ENCOUNTER — Encounter: Payer: Self-pay | Admitting: Hematology

## 2022-10-21 ENCOUNTER — Other Ambulatory Visit: Payer: Self-pay | Admitting: Hematology

## 2022-10-22 ENCOUNTER — Other Ambulatory Visit: Payer: Self-pay

## 2022-10-22 MED ORDER — TAMOXIFEN CITRATE 10 MG PO TABS: 10.0000 mg | ORAL_TABLET | Freq: Every day | ORAL | 2 refills | Status: DC

## 2022-10-24 NOTE — Telephone Encounter (Signed)
Per Dr. Burr Medico, have pt's PCP refill medication.

## 2022-10-27 ENCOUNTER — Inpatient Hospital Stay: Payer: BC Managed Care – PPO | Admitting: Adult Health

## 2022-11-10 ENCOUNTER — Other Ambulatory Visit: Payer: Self-pay | Admitting: Hematology

## 2022-11-11 NOTE — Telephone Encounter (Signed)
Per Dr. Burr Medico, pt's PCP will need to take over management of medication and refills.

## 2022-11-21 ENCOUNTER — Other Ambulatory Visit: Payer: Self-pay | Admitting: Nurse Practitioner

## 2022-11-21 DIAGNOSIS — E042 Nontoxic multinodular goiter: Secondary | ICD-10-CM

## 2022-12-03 ENCOUNTER — Other Ambulatory Visit: Payer: BC Managed Care – PPO

## 2022-12-11 ENCOUNTER — Ambulatory Visit
Admission: RE | Admit: 2022-12-11 | Discharge: 2022-12-11 | Disposition: A | Discharge status: Home / Self Care | Payer: BC Managed Care – PPO | Source: Ambulatory Visit | Attending: Nurse Practitioner | Admitting: Nurse Practitioner

## 2022-12-11 DIAGNOSIS — E042 Nontoxic multinodular goiter: Secondary | ICD-10-CM

## 2022-12-21 ENCOUNTER — Other Ambulatory Visit: Payer: Self-pay | Admitting: Hematology

## 2022-12-22 ENCOUNTER — Other Ambulatory Visit: Payer: Self-pay

## 2022-12-22 MED ORDER — TAMOXIFEN CITRATE 10 MG PO TABS: 10.0000 mg | ORAL_TABLET | Freq: Every day | ORAL | 2 refills | Status: DC

## 2022-12-29 IMAGING — US US AXILLARY RIGHT
1 series · 6 of 6 positions shown · non-contrast
Comparison: None.

CLINICAL DATA: 55-year-old female with bilateral mastectomies
recently performed for LEFT breast cancer. Incidental note of RIGHT
breast invasive lobular cancer. We are asked to evaluate the RIGHT
axillary lymph nodes.

EXAM:
ULTRASOUND OF THE RIGHT AXILLA

[Series 1: us axillary right · 0.07mm/px · 6 of 6 slices shown]
[im 1/6]
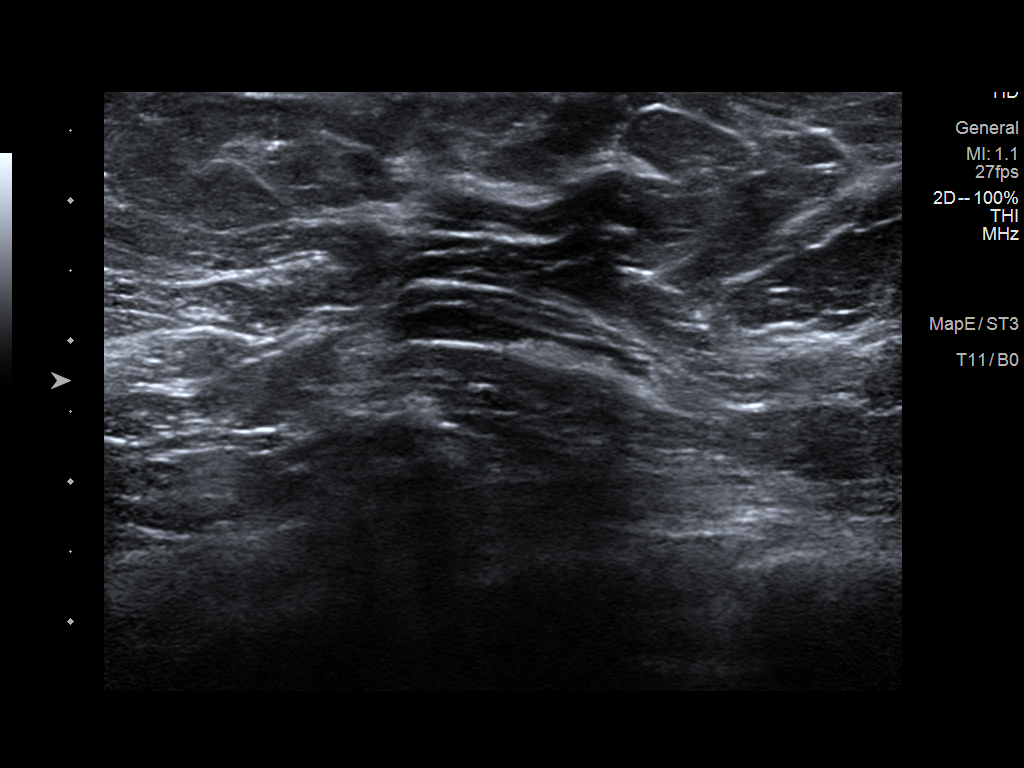
[im 2/6]
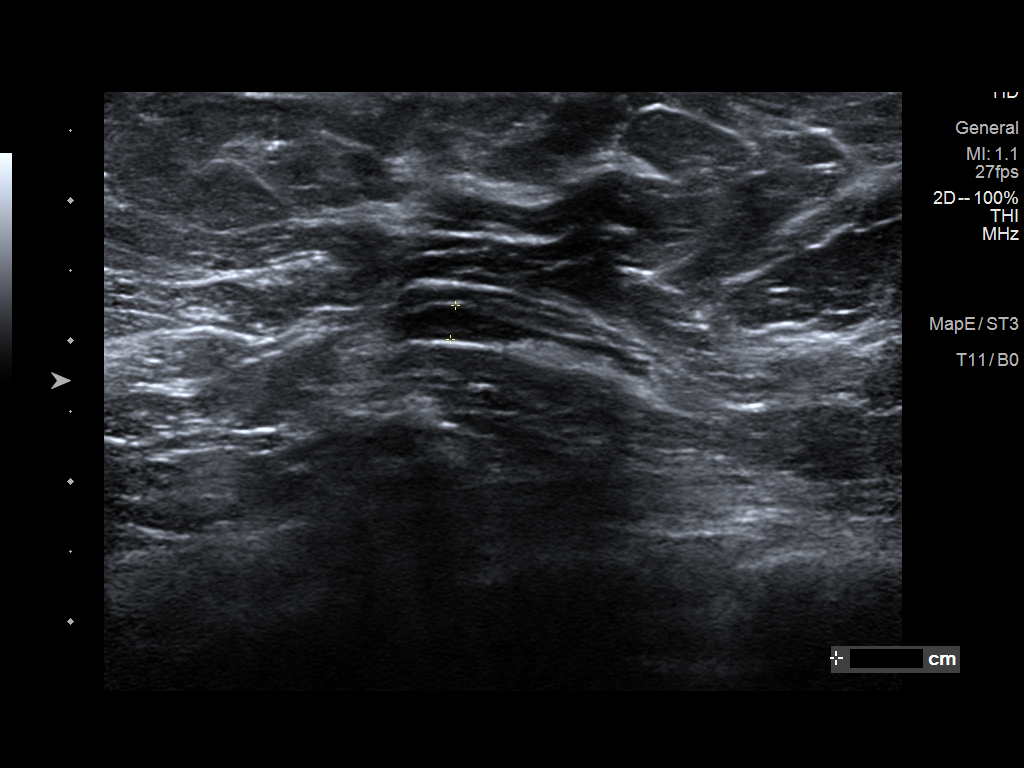
[im 3/6]
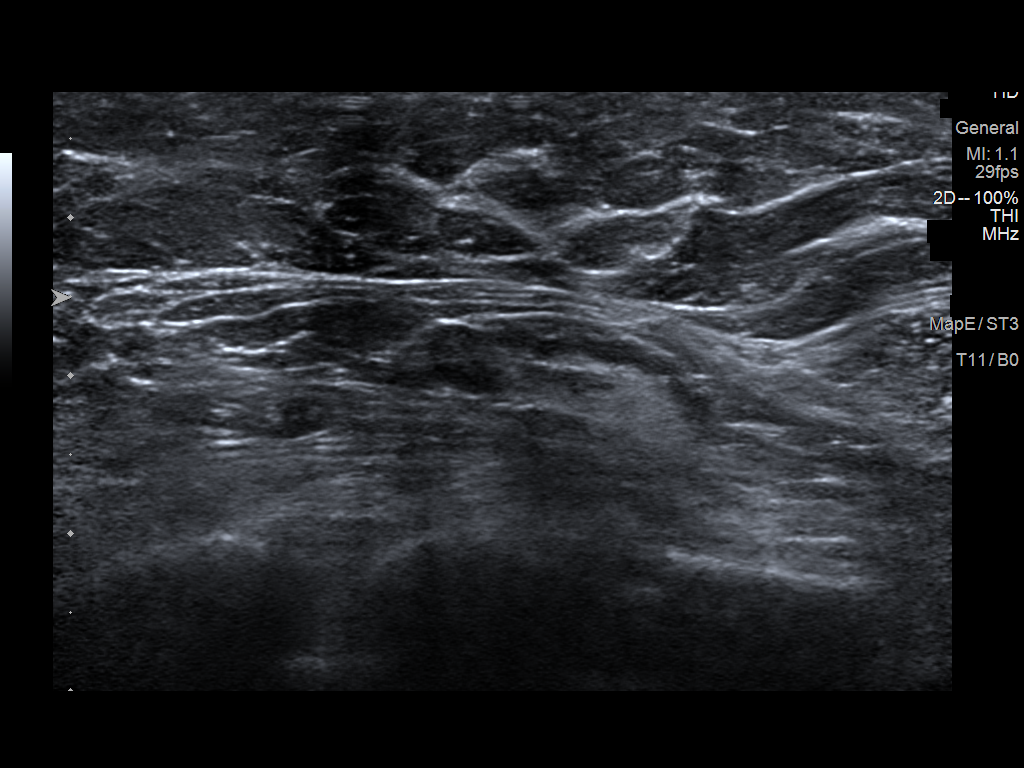
[im 4/6]
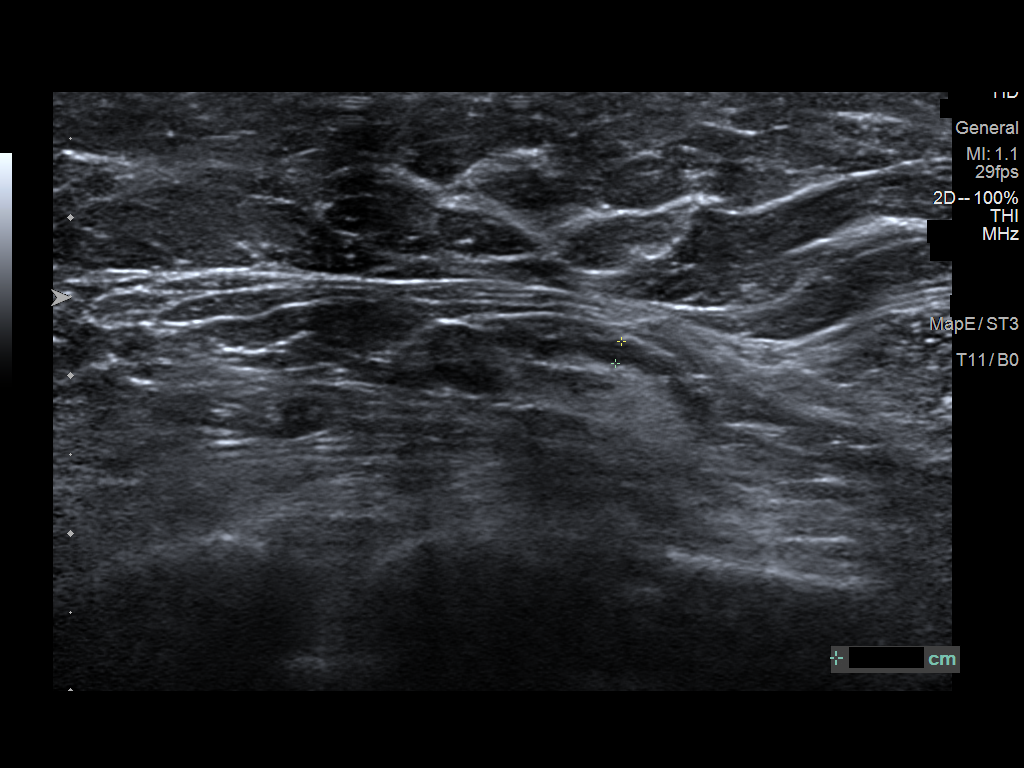
[im 5/6]
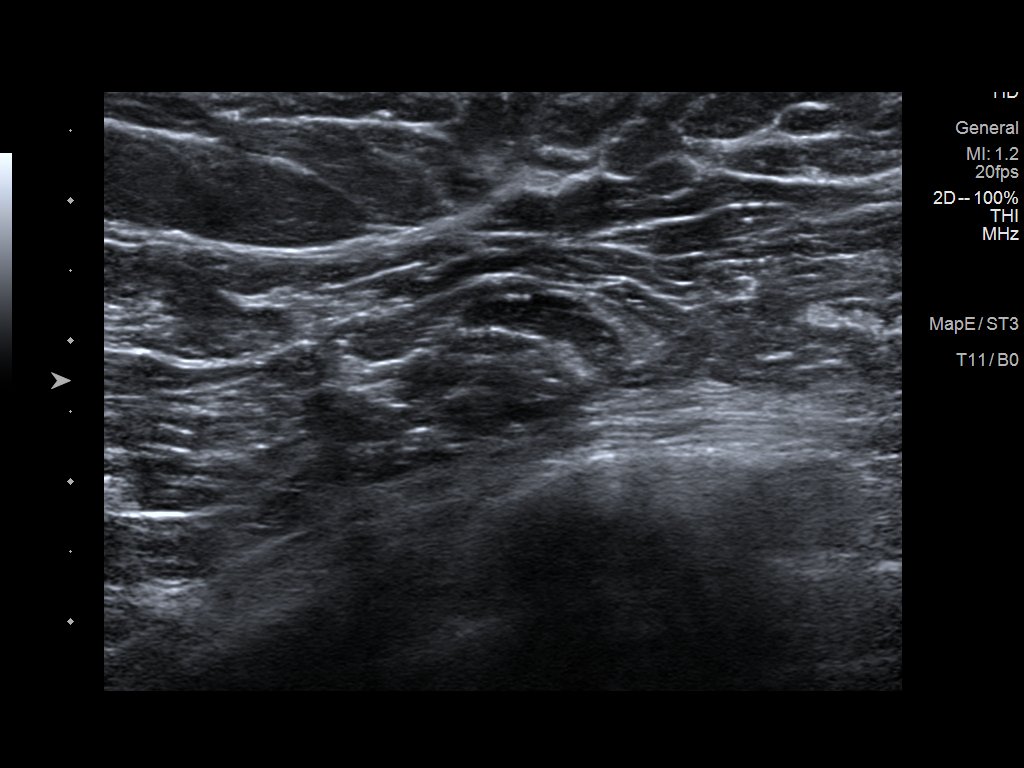
[im 6/6]
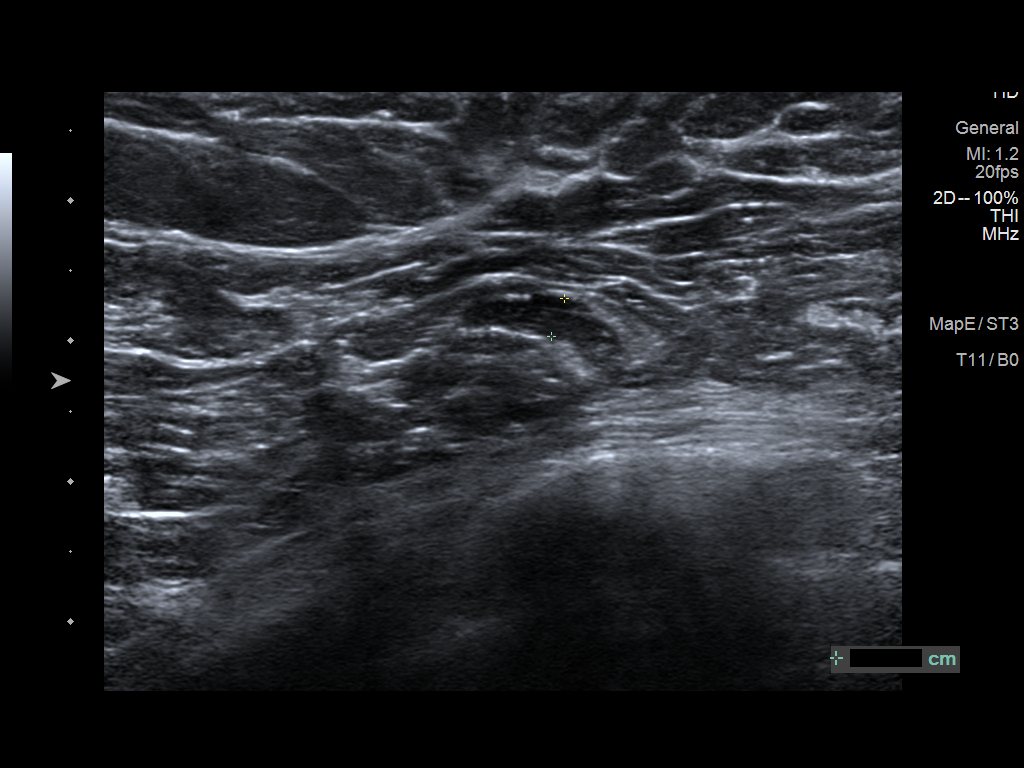

[6 of 6 positions shown; findings below may reference images not displayed]

FINDINGS: Ultrasound is performed, showing no abnormal RIGHT axillary lymph
nodes are identified.
IMPRESSION: No abnormal appearing RIGHT axillary lymph nodes.

RECOMMENDATION:
Clinical follow-up.

I have discussed the findings and recommendations with the patient.
If applicable, a reminder letter will be sent to the patient
regarding the next appointment.

BI-RADS CATEGORY  1: Negative.

## 2023-01-05 ENCOUNTER — Ambulatory Visit: Payer: BC Managed Care – PPO | Attending: Surgery

## 2023-01-05 VITALS — Wt 183.1 lb

## 2023-01-05 DIAGNOSIS — Z483 Aftercare following surgery for neoplasm: Secondary | ICD-10-CM | POA: Insufficient documentation

## 2023-01-05 NOTE — Therapy (Signed)
OUTPATIENT PHYSICAL THERAPY SOZO SCREENING NOTE   Patient Name: Erika Hodge MRN: [id number] DOB:08-May-1966, 57 y.o., female Today's Date: 01/05/2023  PCP: Bonnita Nasuti, MD REFERRING PROVIDER: Erroll Luna, MD   PT End of Session - 01/05/23 1530     Visit Number 22   # unchanged due to screen only   PT Start Time 80    PT Stop Time 1532    PT Time Calculation (min) 4 min    Activity Tolerance Patient tolerated treatment well    Behavior During Therapy WFL for tasks assessed/performed             Past Medical History:  Diagnosis Date   Anxiety    Complication of anesthesia    HA   Dyspnea    due to chemo   GERD (gastroesophageal reflux disease)    Non Hodgkin's lymphoma ([place])    Psoriasis    Past Surgical History:  Procedure Laterality Date   BREAST BIOPSY Left 11/20/2021   BREAST RECONSTRUCTION WITH PLACEMENT OF TISSUE EXPANDER AND ALLODERM Bilateral 01/07/2022   Procedure: BILATERAL BREAST RECONSTRUCTION WITH PLACEMENT OF TISSUE EXPANDERS AND ALLODERM;  Surgeon: Irene Limbo, MD;  Location: [facility];  Service: Plastics;  Laterality: Bilateral;   NIPPLE SPARING MASTECTOMY Right 01/07/2022   Procedure: RIGHT NIPPLE SPARING MASTECTOMY;  Surgeon: Erroll Luna, MD;  Location: [facility];  Service: General;  Laterality: Right;   NIPPLE SPARING MASTECTOMY WITH SENTINEL LYMPH NODE BIOPSY Left 01/07/2022   Procedure: LEFT NIPPLE SPARING MASTECTOMY WITH SENTINEL LYMPH NODE BIOPSY;  Surgeon: Erroll Luna, MD;  Location: [facility];  Service: General;  Laterality: Left;   REMOVAL OF BILATERAL TISSUE EXPANDERS WITH PLACEMENT OF BILATERAL BREAST IMPLANTS Bilateral 07/15/2022   Procedure: REMOVAL OF BILATERAL TISSUE EXPANDERS WITH PLACEMENT OF BILATERAL BREAST IMPLANTS;  Surgeon: Irene Limbo, MD;  Location: [facility];  Service: Plastics;  Laterality: Bilateral;   TUBAL LIGATION     Patient Active  Problem List   Diagnosis Date Noted   Hypokalemia 04/08/2022   Anemia of chronic disease 04/08/2022   Restless leg syndrome 04/08/2022   Obesity (BMI 30-39.9) 04/08/2022   Bronchiectasis without complication ([place]) 0000000   Neutropenic fever ([place]) 04/07/2022   Pulmonary nodules 04/07/2022   Anxiety    Hiatal hernia with GERD    Invasive lobular carcinoma of right breast, stage 1 ([place]) 01/24/2022   Genetic testing 12/11/2021   Family history of breast cancer 11/29/2021   Malignant neoplasm of upper-inner quadrant of left breast in female, estrogen receptor positive ([place]) 11/26/2021    REFERRING DIAG: left breast cancer at risk for lymphedema  THERAPY DIAG:  Aftercare following surgery for neoplasm  PERTINENT HISTORY: Patient was diagnosed on 09/10/2021 with 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 Two negative axillary nodes were removed on the left side. An incidental finding of right breast invasive lobular carcinoma was also found and is ER/PR positive and HER2 negative. It is weakly ER positive, PR negative and HER2 negative with a Ki67 of 20%. She has a left wrist plate from a previous surgery in 2006 and a history of non-Hodgkins Lymphoma in 1985.   PRECAUTIONS: left UE Lymphedema risk, None  SUBJECTIVE: Pt returns for her 3 month L-Dex screen only.   PAIN:  Are you having pain? No  SOZO SCREENING: Patient was assessed today using the SOZO machine  to determine the lymphedema index score. This was compared to her baseline score. It was determined that she is within the recommended range when compared to her baseline and no further action is needed at this time. She will continue SOZO screenings. These are done every 3 months for 2 years post operatively followed by every 6 months for 2 years, and then annually.   L-DEX FLOWSHEETS - 01/05/23 1500        L-DEX LYMPHEDEMA SCREENING   Measurement Type Unilateral    L-DEX MEASUREMENT EXTREMITY Upper Extremity    POSITION  Standing    DOMINANT SIDE Right    At Risk Side Left    BASELINE SCORE (UNILATERAL) 6.4    L-DEX SCORE (UNILATERAL) 7.6    VALUE CHANGE (UNILAT) 1.2               Otelia Limes, PTA 01/05/2023, 3:33 PM

## 2023-01-12 ENCOUNTER — Other Ambulatory Visit: Payer: Self-pay | Admitting: Hematology

## 2023-01-12 MED ORDER — VENLAFAXINE HCL ER 75 MG PO CP24
75.0000 mg | ORAL_CAPSULE | Freq: Every day | ORAL | 1 refills | Status: DC
Start: 2023-01-12 — End: 2023-03-24

## 2023-01-20 ENCOUNTER — Other Ambulatory Visit: Payer: Self-pay

## 2023-01-20 DIAGNOSIS — C50919 Malignant neoplasm of unspecified site of unspecified female breast: Secondary | ICD-10-CM

## 2023-01-20 DIAGNOSIS — C50911 Malignant neoplasm of unspecified site of right female breast: Secondary | ICD-10-CM

## 2023-01-20 DIAGNOSIS — D649 Anemia, unspecified: Secondary | ICD-10-CM

## 2023-01-20 DIAGNOSIS — Z170 Estrogen receptor positive status [ER+]: Secondary | ICD-10-CM

## 2023-01-20 NOTE — Assessment & Plan Note (Addendum)
Stage IA, p(T2, N0), triple negative, Grade 2  -found on screening mammogram. 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 -she opted to proceed with b/l mastectomies with reconstruction on 01/07/22 with Dr. Brantley Stage and Dr. Iran Planas. Pathology revealed b/l breast cancers. Left breast showed 2.1 cm invasive and in situ ductal carcinoma. Margins and lymph nodes negative (0/2). Repeated ER/PR/HER2 were all negative (ER 10% weak (+) on biopsy) -she developed left IMF necrotic tissue, excised by Dr. Iran Planas on 01/27/22. 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  -we reviewed that adjuvant radiation is not recommended due to her previous radiation for lymphoma.

## 2023-01-20 NOTE — Assessment & Plan Note (Signed)
stage I pT1c, ER weakly+/PR+/Her2-, and DCIS  -incidental finding on b/l mastectomies on 01/07/22 for left breast cancer. Pathology from right breast showed: 1.1 cm invasive lobular carcinoma, grade 1, in LIQ; 1 cm DCIS, intermediate grade, in UIQ. ILC showed ER 30% weakly positive, PR 80% moderately positive, Her2 negative. ILC focally involves adjacent anterior margin.  -I recommended she try low-dose tamoxifen (10 mg) first and increase to full dose '20mg'$  daily if she tolerates well.

## 2023-01-21 ENCOUNTER — Encounter: Payer: Self-pay | Admitting: Hematology

## 2023-01-21 ENCOUNTER — Inpatient Hospital Stay: Payer: BC Managed Care – PPO | Attending: Hematology | Admitting: Hematology

## 2023-01-21 ENCOUNTER — Inpatient Hospital Stay: Payer: BC Managed Care – PPO

## 2023-01-21 VITALS — BP 158/91 | HR 97 | Temp 97.2°F | Resp 20 | Ht 63.0 in | Wt 186.1 lb

## 2023-01-21 DIAGNOSIS — Z8572 Personal history of non-Hodgkin lymphomas: Secondary | ICD-10-CM | POA: Insufficient documentation

## 2023-01-21 DIAGNOSIS — C50911 Malignant neoplasm of unspecified site of right female breast: Secondary | ICD-10-CM | POA: Diagnosis not present

## 2023-01-21 DIAGNOSIS — Z170 Estrogen receptor positive status [ER+]: Secondary | ICD-10-CM | POA: Diagnosis not present

## 2023-01-21 DIAGNOSIS — C50212 Malignant neoplasm of upper-inner quadrant of left female breast: Secondary | ICD-10-CM | POA: Diagnosis not present

## 2023-01-21 DIAGNOSIS — Z79810 Long term (current) use of selective estrogen receptor modulators (SERMs): Secondary | ICD-10-CM | POA: Insufficient documentation

## 2023-01-21 DIAGNOSIS — C50919 Malignant neoplasm of unspecified site of unspecified female breast: Secondary | ICD-10-CM

## 2023-01-21 DIAGNOSIS — D649 Anemia, unspecified: Secondary | ICD-10-CM

## 2023-01-21 LAB — CMP (CANCER CENTER ONLY)
ALT: 15 U/L (ref 0–44)
AST: 18 U/L (ref 15–41)
Albumin: 4.3 g/dL (ref 3.5–5.0)
Alkaline Phosphatase: 87 U/L (ref 38–126)
Anion gap: 7 (ref 5–15)
BUN: 19 mg/dL (ref 6–20)
CO2: 27 mmol/L (ref 22–32)
Calcium: 9.5 mg/dL (ref 8.9–10.3)
Chloride: 102 mmol/L (ref 98–111)
Creatinine: 0.86 mg/dL (ref 0.44–1.00)
GFR, Estimated: >60 mL/min (ref >60)
Glucose, Bld: 144 mg/dL — ABNORMAL HIGH (ref 70–99)
Potassium: 4.1 mmol/L (ref 3.5–5.1)
Sodium: 136 mmol/L (ref 135–145)
Total Bilirubin: 0.3 mg/dL (ref 0.3–1.2)
Total Protein: 7.5 g/dL (ref 6.5–8.1)

## 2023-01-21 LAB — CBC WITH DIFFERENTIAL (CANCER CENTER ONLY)
Abs Immature Granulocytes: 0.02 10*3/uL (ref 0.00–0.07)
Basophils Absolute: 0.1 10*3/uL (ref 0.0–0.1)
Basophils Relative: 2 %
Eosinophils Absolute: 0.3 10*3/uL (ref 0.0–0.5)
Eosinophils Relative: 5 %
HCT: 42.1 % (ref 36.0–46.0)
Hemoglobin: 13.9 g/dL (ref 12.0–15.0)
Immature Granulocytes: 0 %
Lymphocytes Relative: 21 %
Lymphs Abs: 1.4 10*3/uL (ref 0.7–4.0)
MCH: 31 pg (ref 26.0–34.0)
MCHC: 33 g/dL (ref 30.0–36.0)
MCV: 94 fL (ref 80.0–100.0)
Monocytes Absolute: 0.8 10*3/uL (ref 0.1–1.0)
Monocytes Relative: 11 %
Neutro Abs: 4.1 10*3/uL (ref 1.7–7.7)
Neutrophils Relative %: 61 %
Platelet Count: 325 10*3/uL (ref 150–400)
RBC: 4.48 MIL/uL (ref 3.87–5.11)
RDW: 12.9 % (ref 11.5–15.5)
WBC Count: 6.8 10*3/uL (ref 4.0–10.5)
nRBC: 0 % (ref 0.0–0.2)

## 2023-01-21 LAB — FERRITIN: Ferritin: 15 ng/mL (ref 11–307)

## 2023-01-21 LAB — IRON AND IRON BINDING CAPACITY (CC-WL,HP ONLY)
Iron: 90 ug/dL (ref 28–170)
Saturation Ratios: 20 % (ref 10.4–31.8)
TIBC: 458 ug/dL — ABNORMAL HIGH (ref 250–450)
UIBC: 368 ug/dL (ref 148–442)

## 2023-01-21 NOTE — Progress Notes (Signed)
[place]   Telephone:([phone] Fax:(336) [phone]   Clinic Follow up Note   Patient Care Team: Bonnita Nasuti, MD as PCP - General (Internal Medicine) Erroll Luna, MD as Consulting Physician (General Surgery) Truitt Merle, MD as Consulting Physician (Hematology) Gery Pray, MD as Consulting Physician (Radiation Oncology)  Date of Service:  01/21/2023  CHIEF COMPLAINT: f/u of bilateral breast cancers   CURRENT THERAPY:  tamoxifen '10mg'$  daily   ASSESSMENT:  Erika Hodge is a 56 y.o. 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 -she opted to proceed with b/l mastectomies with reconstruction on 01/07/22 with Dr. Brantley Stage and Dr. Iran Planas. Pathology revealed b/l breast cancers. Left breast showed 2.1 cm invasive and in situ ductal carcinoma. Margins and lymph nodes negative (0/2). Repeated ER/PR/HER2 were all negative (ER 10% weak (+) on biopsy) -she developed left IMF necrotic tissue, excised by Dr. Iran Planas on 01/27/22. 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  -we reviewed that adjuvant radiation is not recommended due to her previous radiation for lymphoma.    Invasive lobular carcinoma of right breast, stage 1 (HCC) stage I pT1c, ER weakly+/PR+/Her2-, and DCIS  -incidental finding on b/l mastectomies on 01/07/22 for left breast cancer. Pathology from right breast showed: 1.1 cm invasive lobular carcinoma, grade 1, in LIQ; 1 cm DCIS, intermediate grade, in UIQ. ILC showed ER 30% weakly positive, PR 80% moderately positive, Her2 negative. ILC focally involves adjacent anterior margin.  -I recommended she try low-dose tamoxifen (10  mg) first and increase to full dose '20mg'$  daily if she tolerates well. she has noticed moderate side effect from low-dose tamoxifen, and does not want to try full dose  -Due to the drug drug interaction, we changed her Prozac to Effexor, she feels its less effective, I recommend her to increase Effexor dose from 75 mg to 150 mg. -I also discussed the option of change tamoxifen to anastrozole, since she is postmenopausal.  She is reluctant to try new medicine, will think about it.    PLAN: - Pt to stay on Tamoxifen 10 mg daily -Discuss different antiestrogen due to being postmenopausal. -lab reviewed -Increase effexor to '150mg'$  daily  -lab,f/u in 3 months   SUMMARY OF ONCOLOGIC HISTORY: Oncology History Overview Note   Cancer Staging  Invasive lobular carcinoma of right breast, stage 1 ([place]) Staging form: Breast, AJCC 8th Edition - Clinical stage from 01/07/2022: Stage Unknown (cT1c, cNX, cM0, G1, ER+, PR+, HER2-) - Signed by Truitt Merle, MD on 01/24/2022  Malignant neoplasm of upper-inner quadrant of left breast in female, estrogen receptor positive ([place]) Staging form: Breast, AJCC 8th Edition - Clinical stage from 11/20/2021: Stage IA (cT1c, cN0, cM0, G2, ER+, PR-, HER2-) - Signed by Truitt Merle, MD on 11/26/2021 - Pathologic stage from 01/07/2022: Stage IIA (pT2, pN0, cM0, G2, ER-, PR-, HER2-) - Signed by Truitt Merle, MD on 01/24/2022     Malignant neoplasm of upper-inner quadrant of left breast in female, estrogen receptor positive ([place])  11/05/2021 Mammogram   CLINICAL DATA:  Screening recall for a possible left breast asymmetry.   EXAM: DIGITAL DIAGNOSTIC UNILATERAL LEFT MAMMOGRAM WITH TOMOSYNTHESIS AND CAD; ULTRASOUND LEFT BREAST LIMITED  IMPRESSION: 1.  Irregular 1.2 cm mass in the left breast at 11 o'clock, 2 cm the nipple, suspicious for malignancy.   11/20/2021 Cancer Staging   Staging form: Breast, AJCC 8th Edition - Clinical stage from 11/20/2021: Stage IA (cT1c, cN0, cM0, G2,  ER+, PR-, HER2-) - Signed by Truitt Merle, MD on 11/26/2021 Stage prefix: Initial diagnosis Histologic grading system: 3 grade system   11/20/2021 Initial Biopsy   Diagnosis Breast, left, needle core biopsy, 11 o'clock, ribbon clip - INVASIVE DUCTAL CARCINOMA - DUCTAL CARCINOMA IN SITU - SEE COMMENT Microscopic Comment based on the biopsy, the carcinoma appears Nottingham grade 2 of 3 and measures 0.6 cm in greatest linear extent.  PROGNOSTIC INDICATORS Results: The tumor cells are NEGATIVE for Her2 (1+). Estrogen Receptor: 10%, POSITIVE, WEAK STAINING INTENSITY Progesterone Receptor: 0%, NEGATIVE Proliferation Marker Ki67: 20%   11/26/2021 Initial Diagnosis   Malignant neoplasm of upper-inner quadrant of left breast in female, estrogen receptor positive ([place])   11/27/2021 Genetic Testing   [facility] was Negative. Report date is 12/11/2021.  The CancerNext gene panel offered by [employer] includes sequencing, rearrangement analysis, and RNA analysis for the following 36 genes:   APC, ATM, AXIN2, BARD1, BMPR1A, BRCA1, BRCA2, BRIP1, CDH1, CDK4, CDKN2A, CHEK2, DICER1, HOXB13, EPCAM, GREM1, MLH1, MSH2, MSH3, MSH6, MUTYH, NBN, NF1, NTHL1, PALB2, PMS2, POLD1, POLE, PTEN, RAD51C, RAD51D, RECQL, SMAD4, SMARCA4, STK11, and TP53.    01/07/2022 Cancer Staging   Staging form: Breast, AJCC 8th Edition - Pathologic stage from 01/07/2022: Stage IIA (pT2, pN0, cM0, G2, ER-, PR-, HER2-) - Signed by Truitt Merle, MD on 01/24/2022 Stage prefix: Initial diagnosis Histologic grading system: 3 grade system Residual tumor (R): R0 - None   01/07/2022 Definitive Surgery   FINAL MICROSCOPIC DIAGNOSIS:   A. BREAST, RIGHT NIPPLE, BIOPSY:  - Benign breast tissue.  - No malignancy identified.   B. BREAST, LEFT NIPPLE, BIOPSY:  - Benign breast tissue.  - No malignancy identified.   C. BREAST, RIGHT, MASTECTOMY:  - Invasive lobular carcinoma, 1.1 cm.  - Ductal carcinoma in situ, 1 cm.  - Invasive  carcinoma focally extends to the anterior margin.  - Fibrocystic changes with usual ductal hyperplasia and calcifications.  - Sclerosing adenosis and fibroadenomatoid change.  - See oncology table and comment.   D. LYMPH NODE, LEFT AXILLARY, SENTINEL, EXCISION:  - One lymph node negative for metastatic carcinoma (0/1).   E. LYMPH NODE, LEFT AXILLARY, SENTINEL, EXCISION:  - One lymph node negative for metastatic carcinoma (0/1).   F. BREAST, LEFT, MASTECTOMY:  - Invasive and in situ ductal carcinoma, 2.1 cm.  - Margins negative for carcinoma.  - See oncology table.    01/07/2022 Receptors her2   F1. PROGNOSTIC INDICATOR RESULTS:   The tumor cells are NEGATIVE for Her2 (0).   Estrogen Receptor: NEGATIVE  Progesterone Receptor: NEGATIVE  Proliferation Marker Ki-67: 10%    02/12/2022 - 06/06/2022 Chemotherapy   Patient is on Treatment Plan : BREAST TC q21d     08/2022 -  Anti-estrogen oral therapy   Tamoxifen   Invasive lobular carcinoma of right breast, stage 1 ([place])  11/27/2021 Genetic Testing   [employer] was Negative. Report date is 12/11/2021.  The CancerNext gene panel offered by [employer] includes sequencing, rearrangement analysis, and RNA analysis for the following 36 genes:   APC, ATM, AXIN2, BARD1, BMPR1A, BRCA1, BRCA2, BRIP1, CDH1, CDK4, CDKN2A, CHEK2, DICER1, HOXB13, EPCAM, GREM1, MLH1, MSH2, MSH3, MSH6, MUTYH, NBN, NF1, NTHL1, PALB2, PMS2, POLD1, POLE, PTEN, RAD51C,  RAD51D, RECQL, SMAD4, SMARCA4, STK11, and TP53.    01/07/2022 Cancer Staging   Staging form: Breast, AJCC 8th Edition - Clinical stage from 01/07/2022: Stage Unknown (cT1c, cNX, cM0, G1, ER+, PR+, HER2-) - Signed by Truitt Merle, MD on 01/24/2022 Stage prefix: Initial diagnosis Histologic grading system: 3 grade system   01/07/2022 Definitive Surgery   FINAL MICROSCOPIC DIAGNOSIS:   A. BREAST, RIGHT NIPPLE, BIOPSY:  - Benign breast tissue.  - No malignancy identified.   B. BREAST, LEFT NIPPLE,  BIOPSY:  - Benign breast tissue.  - No malignancy identified.   C. BREAST, RIGHT, MASTECTOMY:  - Invasive lobular carcinoma, 1.1 cm.  - Ductal carcinoma in situ, 1 cm.  - Invasive carcinoma focally extends to the anterior margin.  - Fibrocystic changes with usual ductal hyperplasia and calcifications.  - Sclerosing adenosis and fibroadenomatoid change.  - See oncology table and comment.   D. LYMPH NODE, LEFT AXILLARY, SENTINEL, EXCISION:  - One lymph node negative for metastatic carcinoma (0/1).   E. LYMPH NODE, LEFT AXILLARY, SENTINEL, EXCISION:  - One lymph node negative for metastatic carcinoma (0/1).   F. BREAST, LEFT, MASTECTOMY:  - Invasive and in situ ductal carcinoma, 2.1 cm.  - Margins negative for carcinoma.  - See oncology table.    01/07/2022 Receptors her2   C7.  PROGNOSTIC INDICATOR RESULTS:   The tumor cells are NEGATIVE for Her2 (1+).   Estrogen Receptor: POSITIVE, 30% WEAK STAINING INTENSITY  Progesterone Receptor: POSITIVE, 80% MODERATE STAINING INTENSITY  Proliferation Marker Ki-67: <5%    01/24/2022 Initial Diagnosis   Invasive lobular carcinoma of right breast, stage 1 ([place])   08/2022 -  Anti-estrogen oral therapy   Tamoxifen      INTERVAL HISTORY:  Erika Hodge is here for a follow up of bilateral breast cancers  She was last seen by  NP Mendel Ryder on  09/30/2022 She presents to the clinic accompanied by husband. Pt state that she is till taking the Tamoxifen. Pt state she has some side effects from taken Tamoxifen, hot flashes, they are manageable, joint pain. Pt report she having trouble with her esophagus, really bad heart buren to where she is vomiting. Pt sate she had acid reflux issue.Pt state  she is having hand surgery.      All other systems were reviewed with the patient and are negative.  MEDICAL HISTORY:  Past Medical History:  Diagnosis Date   Anxiety    Complication of anesthesia    HA   Dyspnea    due to chemo   GERD  (gastroesophageal reflux disease)    Non Hodgkin's lymphoma ([place])    Psoriasis     SURGICAL HISTORY: Past Surgical History:  Procedure Laterality Date   BREAST BIOPSY Left 11/20/2021   BREAST RECONSTRUCTION WITH PLACEMENT OF TISSUE EXPANDER AND ALLODERM Bilateral 01/07/2022   Procedure: BILATERAL BREAST RECONSTRUCTION WITH PLACEMENT OF TISSUE EXPANDERS AND ALLODERM;  Surgeon: Irene Limbo, MD;  Location: [place];  Service: Plastics;  Laterality: Bilateral;   NIPPLE SPARING MASTECTOMY Right 01/07/2022   Procedure: RIGHT NIPPLE SPARING MASTECTOMY;  Surgeon: Erroll Luna, MD;  Location: [place];  Service: General;  Laterality: Right;   NIPPLE SPARING MASTECTOMY WITH SENTINEL LYMPH NODE BIOPSY Left 01/07/2022   Procedure: LEFT NIPPLE SPARING MASTECTOMY WITH SENTINEL LYMPH NODE BIOPSY;  Surgeon: Erroll Luna, MD;  Location: [place];  Service: General;  Laterality: Left;   REMOVAL OF BILATERAL TISSUE EXPANDERS WITH PLACEMENT OF BILATERAL  BREAST IMPLANTS Bilateral 07/15/2022   Procedure: REMOVAL OF BILATERAL TISSUE EXPANDERS WITH PLACEMENT OF BILATERAL BREAST IMPLANTS;  Surgeon: Irene Limbo, MD;  Location: [place];  Service: Plastics;  Laterality: Bilateral;   TUBAL LIGATION      I have reviewed the social history and family history with the patient and they are unchanged from previous note.  ALLERGIES:  is allergic to amoxicillin, ibuprofen, ciprofloxacin, and codeine.  MEDICATIONS:  Current Outpatient Medications  Medication Sig Dispense Refill   ALPRAZolam (XANAX) 0.5 MG tablet Take 0.5 mg by mouth daily as needed for anxiety or sleep.     loratadine (CLARITIN) 10 MG tablet Take 10 mg by mouth daily.     omeprazole (PRILOSEC) 40 MG capsule Take 40 mg by mouth daily.     rOPINIRole (REQUIP) 0.5 MG tablet TAKE 1 TABLET BY MOUTH EVERYDAY AT BEDTIME 30 tablet 0   tamoxifen (NOLVADEX) 10 MG tablet Take 1 tablet  (10 mg total) by mouth daily. 30 tablet 2   venlafaxine XR (EFFEXOR-XR) 75 MG 24 hr capsule Take 1 capsule (75 mg total) by mouth daily with breakfast. 90 capsule 1   VENTOLIN HFA 108 (90 Base) MCG/ACT inhaler TAKE 2 PUFFS BY MOUTH EVERY 6 HOURS AS NEEDED FOR WHEEZE OR SHORTNESS OF BREATH 18 each 2   zolpidem (AMBIEN) 10 MG tablet Take 10 mg by mouth at bedtime as needed for sleep.     No current facility-administered medications for this visit.    PHYSICAL EXAMINATION: ECOG PERFORMANCE STATUS: 1 - Symptomatic but completely ambulatory  Vitals:   01/21/23 1023  BP: (!) 158/91  Pulse: 97  Resp: 20  Temp: (!) 97.2 F (36.2 C)  SpO2: 99%   Wt Readings from Last 3 Encounters:  01/21/23 186 lb 1.6 oz (84.4 kg)  01/05/23 183 lb 2 oz (83.1 kg)  09/30/22 182 lb (82.6 kg)      NECK:(-) supple, thyroid normal size, non-tender, without nodularity LYMPH:  (-)no palpable lymphadenopathy in the cervical, axillary  BREAST :Bilateral Mastectomy and reconstruction.  RT breast no palpable mass breast exam benign. Lt breast no palpable mass, breast exam benign.  No axillary adenopathy.      LABORATORY DATA:  I have reviewed the data as listed    Latest Ref Rng & Units 01/21/2023    9:59 AM 09/30/2022    1:53 PM 07/09/2022    1:06 PM  CBC  WBC 4.0 - 10.5 K/uL 6.8  6.0  5.8   Hemoglobin 12.0 - 15.0 g/dL 13.9  13.6  11.4   Hematocrit 36.0 - 46.0 % 42.1  41.8  36.3   Platelets 150 - 400 K/uL 325  343  405         Latest Ref Rng & Units 01/21/2023    9:59 AM 09/30/2022    1:53 PM 07/09/2022    1:06 PM  CMP  Glucose 70 - 99 mg/dL 144  112  98   BUN 6 - 20 mg/dL '19  16  16   '$ Creatinine 0.44 - 1.00 mg/dL 0.86  0.80  0.77   Sodium 135 - 145 mmol/L 136  136  139   Potassium 3.5 - 5.1 mmol/L 4.1  4.1  4.4   Chloride 98 - 111 mmol/L 102  104  105   CO2 22 - 32 mmol/L '27  26  30   '$ Calcium 8.9 - 10.3 mg/dL 9.5  10.2  9.9   Total Protein 6.5 - 8.1 g/dL  7.5  7.5  7.1   Total Bilirubin 0.3 -  1.2 mg/dL 0.3  0.2  0.3   Alkaline Phos 38 - 126 U/L 87  62  59   AST 15 - 41 U/L '18  22  22   '$ ALT 0 - 44 U/L '15  19  17       '$ RADIOGRAPHIC STUDIES: I have personally reviewed the radiological images as listed and agreed with the findings in the report. No results found.    No orders of the defined types were placed in this encounter.  All questions were answered. The patient knows to call the clinic with any problems, questions or concerns. No barriers to learning was detected. The total time spent in the appointment was 30 minutes.     Truitt Merle, MD 01/21/2023   Felicity Coyer, CMA, am acting as scribe for Truitt Merle, MD.   I have reviewed the above documentation for accuracy and completeness, and I agree with the above.

## 2023-01-23 LAB — FOLLICLE STIMULATING HORMONE: FSH: 42.8 m[IU]/mL

## 2023-01-24 ENCOUNTER — Other Ambulatory Visit: Payer: Self-pay | Admitting: Hematology

## 2023-02-03 ENCOUNTER — Encounter: Payer: Self-pay | Admitting: Hematology

## 2023-02-12 HISTORY — PX: ESOPHAGOGASTRODUODENOSCOPY: SHX1529

## 2023-02-16 ENCOUNTER — Telehealth: Payer: Self-pay

## 2023-02-16 ENCOUNTER — Telehealth: Payer: Self-pay | Admitting: Nurse Practitioner

## 2023-02-16 ENCOUNTER — Encounter: Payer: Self-pay | Admitting: Hematology

## 2023-02-16 ENCOUNTER — Telehealth: Payer: Self-pay | Admitting: Hematology

## 2023-02-16 NOTE — Telephone Encounter (Signed)
Contacted patient to scheduled appointments. Patient is aware of appointments that are scheduled.   

## 2023-02-16 NOTE — Telephone Encounter (Signed)
Pt LVM and sent a MyChart message stating she's currently on a hormone blocker and just recently had a EGD d/t throat issues.  Pt stated she was rated a Grade A and wanted to speak with Dr. Mosetta Putt or Santiago Glad, NP regarding her recent results.  Notified Dr. Mosetta Putt and Santiago Glad, NP of the pt's message and call.

## 2023-02-17 ENCOUNTER — Inpatient Hospital Stay: Payer: BC Managed Care – PPO | Attending: Hematology | Admitting: Nurse Practitioner

## 2023-02-17 ENCOUNTER — Encounter: Payer: Self-pay | Admitting: Nurse Practitioner

## 2023-02-17 ENCOUNTER — Telehealth: Payer: Self-pay | Admitting: *Deleted

## 2023-02-17 DIAGNOSIS — Z79810 Long term (current) use of selective estrogen receptor modulators (SERMs): Secondary | ICD-10-CM | POA: Diagnosis not present

## 2023-02-17 DIAGNOSIS — C50911 Malignant neoplasm of unspecified site of right female breast: Secondary | ICD-10-CM | POA: Diagnosis not present

## 2023-02-17 DIAGNOSIS — Z170 Estrogen receptor positive status [ER+]: Secondary | ICD-10-CM

## 2023-02-17 DIAGNOSIS — C50212 Malignant neoplasm of upper-inner quadrant of left female breast: Secondary | ICD-10-CM

## 2023-02-17 NOTE — Progress Notes (Signed)
Patient Care Team: Galvin Proffer, MD as PCP - General (Internal Medicine) Harriette Bouillon, MD as Consulting Physician (General Surgery) Malachy Mood, MD as Consulting Physician (Hematology) Antony Blackbird, MD as Consulting Physician (Radiation Oncology)   I connected with Erika Hodge on 02/17/23 at 11:30 AM EDT by telephone visit and verified that I am speaking with the correct person using two identifiers.   I discussed the limitations, risks, security and privacy concerns of performing an evaluation and management service by telemedicine and the availability of in-person appointments. I also discussed with the patient that there may be a patient responsible charge related to this service. The patient expressed understanding and agreed to proceed.   Other persons participating in the visit and their role in the encounter: none    Patient's location: Home  Provider's location: [facility] office    CHIEF COMPLAINT: Follow up bilateral breast cancer   Oncology History Overview Note   Cancer Staging  Invasive lobular carcinoma of right breast, stage 1 (HCC) Staging form: Breast, AJCC 8th Edition - Clinical stage from 01/07/2022: Stage Unknown (cT1c, cNX, cM0, G1, ER+, PR+, HER2-) - Signed by Malachy Mood, MD on 01/24/2022  Malignant neoplasm of upper-inner quadrant of left breast in female, estrogen receptor positive (HCC) Staging form: Breast, AJCC 8th Edition - Clinical stage from 11/20/2021: Stage IA (cT1c, cN0, cM0, G2, ER+, PR-, HER2-) - Signed by Malachy Mood, MD on 11/26/2021 - Pathologic stage from 01/07/2022: Stage IIA (pT2, pN0, cM0, G2, ER-, PR-, HER2-) - Signed by Malachy Mood, MD on 01/24/2022     Malignant neoplasm of upper-inner quadrant of left breast in female, estrogen receptor positive  11/05/2021 Mammogram   CLINICAL DATA:  Screening recall for a possible left breast asymmetry.   EXAM: DIGITAL DIAGNOSTIC UNILATERAL LEFT MAMMOGRAM WITH TOMOSYNTHESIS AND CAD; ULTRASOUND LEFT  BREAST LIMITED  IMPRESSION: 1. Irregular 1.2 cm mass in the left breast at 11 o'clock, 2 cm the nipple, suspicious for malignancy.   11/20/2021 Cancer Staging   Staging form: Breast, AJCC 8th Edition - Clinical stage from 11/20/2021: Stage IA (cT1c, cN0, cM0, G2, ER+, PR-, HER2-) - Signed by Malachy Mood, MD on 11/26/2021 Stage prefix: Initial diagnosis Histologic grading system: 3 grade system   11/20/2021 Initial Biopsy   Diagnosis Breast, left, needle core biopsy, 11 o'clock, ribbon clip - INVASIVE DUCTAL CARCINOMA - DUCTAL CARCINOMA IN SITU - SEE COMMENT Microscopic Comment based on the biopsy, the carcinoma appears Nottingham grade 2 of 3 and measures 0.6 cm in greatest linear extent.  PROGNOSTIC INDICATORS Results: The tumor cells are NEGATIVE for Her2 (1+). Estrogen Receptor: 10%, POSITIVE, WEAK STAINING INTENSITY Progesterone Receptor: 0%, NEGATIVE Proliferation Marker Ki67: 20%   11/26/2021 Initial Diagnosis   Malignant neoplasm of upper-inner quadrant of left breast in female, estrogen receptor positive (HCC)   11/27/2021 Genetic Testing   [facility] was Negative. Report date is 12/11/2021.  The CancerNext gene panel offered by [employer] includes sequencing, rearrangement analysis, and RNA analysis for the following 36 genes:   APC, ATM, AXIN2, BARD1, BMPR1A, BRCA1, BRCA2, BRIP1, CDH1, CDK4, CDKN2A, CHEK2, DICER1, HOXB13, EPCAM, GREM1, MLH1, MSH2, MSH3, MSH6, MUTYH, NBN, NF1, NTHL1, PALB2, PMS2, POLD1, POLE, PTEN, RAD51C, RAD51D, RECQL, SMAD4, SMARCA4, STK11, and TP53.    01/07/2022 Cancer Staging   Staging form: Breast, AJCC 8th Edition - Pathologic stage from 01/07/2022: Stage IIA (pT2, pN0, cM0, G2, ER-, PR-, HER2-) - Signed by Malachy Mood, MD on 01/24/2022 Stage prefix: Initial diagnosis Histologic  grading system: 3 grade system Residual tumor (R): R0 - None   01/07/2022 Definitive Surgery   FINAL MICROSCOPIC DIAGNOSIS:   A. BREAST, RIGHT NIPPLE, BIOPSY:  -  Benign breast tissue.  - No malignancy identified.   B. BREAST, LEFT NIPPLE, BIOPSY:  - Benign breast tissue.  - No malignancy identified.   C. BREAST, RIGHT, MASTECTOMY:  - Invasive lobular carcinoma, 1.1 cm.  - Ductal carcinoma in situ, 1 cm.  - Invasive carcinoma focally extends to the anterior margin.  - Fibrocystic changes with usual ductal hyperplasia and calcifications.  - Sclerosing adenosis and fibroadenomatoid change.  - See oncology table and comment.   D. LYMPH NODE, LEFT AXILLARY, SENTINEL, EXCISION:  - One lymph node negative for metastatic carcinoma (0/1).   E. LYMPH NODE, LEFT AXILLARY, SENTINEL, EXCISION:  - One lymph node negative for metastatic carcinoma (0/1).   F. BREAST, LEFT, MASTECTOMY:  - Invasive and in situ ductal carcinoma, 2.1 cm.  - Margins negative for carcinoma.  - See oncology table.    01/07/2022 Receptors her2   F1. PROGNOSTIC INDICATOR RESULTS:   The tumor cells are NEGATIVE for Her2 (0).   Estrogen Receptor: NEGATIVE  Progesterone Receptor: NEGATIVE  Proliferation Marker Ki-67: 10%    02/12/2022 - 06/06/2022 Chemotherapy   Patient is on Treatment Plan : BREAST TC q21d     08/2022 -  Anti-estrogen oral therapy   Tamoxifen   Invasive lobular carcinoma of right breast, stage 1  11/27/2021 Genetic Testing   [employer] [facility] was Negative. Report date is 12/11/2021.  The CancerNext gene panel offered by [employer] includes sequencing, rearrangement analysis, and RNA analysis for the following 36 genes:   APC, ATM, AXIN2, BARD1, BMPR1A, BRCA1, BRCA2, BRIP1, CDH1, CDK4, CDKN2A, CHEK2, DICER1, HOXB13, EPCAM, GREM1, MLH1, MSH2, MSH3, MSH6, MUTYH, NBN, NF1, NTHL1, PALB2, PMS2, POLD1, POLE, PTEN, RAD51C, RAD51D, RECQL, SMAD4, SMARCA4, STK11, and TP53.    01/07/2022 Cancer Staging   Staging form: Breast, AJCC 8th Edition - Clinical stage from 01/07/2022: Stage Unknown (cT1c, cNX, cM0, G1, ER+, PR+, HER2-) - Signed by Malachy Mood, MD on  01/24/2022 Stage prefix: Initial diagnosis Histologic grading system: 3 grade system   01/07/2022 Definitive Surgery   FINAL MICROSCOPIC DIAGNOSIS:   A. BREAST, RIGHT NIPPLE, BIOPSY:  - Benign breast tissue.  - No malignancy identified.   B. BREAST, LEFT NIPPLE, BIOPSY:  - Benign breast tissue.  - No malignancy identified.   C. BREAST, RIGHT, MASTECTOMY:  - Invasive lobular carcinoma, 1.1 cm.  - Ductal carcinoma in situ, 1 cm.  - Invasive carcinoma focally extends to the anterior margin.  - Fibrocystic changes with usual ductal hyperplasia and calcifications.  - Sclerosing adenosis and fibroadenomatoid change.  - See oncology table and comment.   D. LYMPH NODE, LEFT AXILLARY, SENTINEL, EXCISION:  - One lymph node negative for metastatic carcinoma (0/1).   E. LYMPH NODE, LEFT AXILLARY, SENTINEL, EXCISION:  - One lymph node negative for metastatic carcinoma (0/1).   F. BREAST, LEFT, MASTECTOMY:  - Invasive and in situ ductal carcinoma, 2.1 cm.  - Margins negative for carcinoma.  - See oncology table.    01/07/2022 Receptors her2   C7.  PROGNOSTIC INDICATOR RESULTS:   The tumor cells are NEGATIVE for Her2 (1+).   Estrogen Receptor: POSITIVE, 30% WEAK STAINING INTENSITY  Progesterone Receptor: POSITIVE, 80% MODERATE STAINING INTENSITY  Proliferation Marker Ki-67: <5%    01/24/2022 Initial Diagnosis   Invasive lobular carcinoma of right breast, stage 1 (HCC)  08/2022 -  Anti-estrogen oral therapy   Tamoxifen      CURRENT THERAPY: Anti-estrogen therapy with Tamoxifen, starting 08/2022  INTERVAL HISTORY Jasmine DecemberSharon again presents by phone to discuss some questions since last visit with Dr. Mosetta PuttFeng 01/21/2023.  At that time agreed to continue low-dose tamoxifen.  In the interim she developed regurgitation in the setting of chronic GERD, underwent endoscopy by Dr. Elnoria HowardHung which showed esophagitis, he told her to stop omeprazole and prescribed Voquenza.  She also continues to report some  joint pain, brain fog, hot flashes, and irritability which is out of character for her.  She is dealing with stress with her youngest son, she tried to increase Effexor but has found that Prozac is the only thing that really helps her and would like to go back on it.  She is also eating healthier, trying to lower her blood sugar and live a healthy active lifestyle.  ROS  All other systems reviewed and negative  Past Medical History:  Diagnosis Date   Anxiety    Complication of anesthesia    HA   Dyspnea    due to chemo   GERD (gastroesophageal reflux disease)    Non Hodgkin's lymphoma (HCC)    Psoriasis      Past Surgical History:  Procedure Laterality Date   BREAST BIOPSY Left 11/20/2021   BREAST RECONSTRUCTION WITH PLACEMENT OF TISSUE EXPANDER AND ALLODERM Bilateral 01/07/2022   Procedure: BILATERAL BREAST RECONSTRUCTION WITH PLACEMENT OF TISSUE EXPANDERS AND ALLODERM;  Surgeon: Glenna Fellowshimmappa, Brinda, MD;  Location: [facility];  Service: Plastics;  Laterality: Bilateral;   NIPPLE SPARING MASTECTOMY Right 01/07/2022   Procedure: RIGHT NIPPLE SPARING MASTECTOMY;  Surgeon: Harriette Bouillonornett, Thomas, MD;  Location: [facility];  Service: General;  Laterality: Right;   NIPPLE SPARING MASTECTOMY WITH SENTINEL LYMPH NODE BIOPSY Left 01/07/2022   Procedure: LEFT NIPPLE SPARING MASTECTOMY WITH SENTINEL LYMPH NODE BIOPSY;  Surgeon: Harriette Bouillonornett, Thomas, MD;  Location: [facility];  Service: General;  Laterality: Left;   REMOVAL OF BILATERAL TISSUE EXPANDERS WITH PLACEMENT OF BILATERAL BREAST IMPLANTS Bilateral 07/15/2022   Procedure: REMOVAL OF BILATERAL TISSUE EXPANDERS WITH PLACEMENT OF BILATERAL BREAST IMPLANTS;  Surgeon: Glenna Fellowshimmappa, Brinda, MD;  Location: [facility];  Service: Plastics;  Laterality: Bilateral;   TUBAL LIGATION       Outpatient Encounter Medications as of 02/17/2023  Medication Sig   ALPRAZolam (XANAX) 0.5 MG tablet Take 0.5 mg by mouth  daily as needed for anxiety or sleep.   loratadine (CLARITIN) 10 MG tablet Take 10 mg by mouth daily.   omeprazole (PRILOSEC) 40 MG capsule Take 40 mg by mouth daily.   rOPINIRole (REQUIP) 0.5 MG tablet TAKE 1 TABLET BY MOUTH EVERYDAY AT BEDTIME   tamoxifen (NOLVADEX) 10 MG tablet Take 1 tablet (10 mg total) by mouth daily.   venlafaxine XR (EFFEXOR-XR) 75 MG 24 hr capsule Take 1 capsule (75 mg total) by mouth daily with breakfast.   VENTOLIN HFA 108 (90 Base) MCG/ACT inhaler TAKE 2 PUFFS BY MOUTH EVERY 6 HOURS AS NEEDED FOR WHEEZE OR SHORTNESS OF BREATH   zolpidem (AMBIEN) 10 MG tablet Take 10 mg by mouth at bedtime as needed for sleep.   [DISCONTINUED] prochlorperazine (COMPAZINE) 10 MG tablet Take 1 tablet (10 mg total) by mouth every 6 (six) hours as needed (Nausea or vomiting).   No facility-administered encounter medications on file as of 02/17/2023.     There were no vitals filed for this visit. There  is no height or weight on file to calculate BMI.    Lab data  There were no labs for this visit   ASSESSMENT & PLAN:Ola V Peoples is a 57 y.o. 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 -she opted to proceed with b/l mastectomies with reconstruction on 01/07/22 with Dr. Luisa Hart and Dr. Leta Baptist. Pathology revealed b/l breast cancers. Left breast showed 2.1 cm invasive and in situ ductal carcinoma. Margins and lymph nodes negative (0/2). Repeated ER/PR/HER2 were all negative (ER 10% weak (+) on biopsy) -she developed left IMF necrotic tissue, excised by Dr. Leta Baptist on 01/27/22. -given prior treatment with adriamycin, she completed 4 cycles TC 02/12/22 - 06/06/22, tolerated fairly  -we reviewed that adjuvant radiation is not recommended due to her previous radiation for lymphoma.     Invasive lobular carcinoma of right breast, stage 1; stage I pT1c, ER weakly+/PR+/Her2-, and DCIS  -incidental finding on b/l mastectomies on 01/07/22 for left breast cancer. Pathology from right breast showed: 1.1 cm invasive lobular carcinoma, grade 1, in LIQ; 1 cm DCIS, intermediate grade, in UIQ. ILC showed ER 30% weakly positive, PR 80% moderately positive, Her2 negative. ILC focally involves adjacent anterior margin.  -She has tried low-dose and full dose tamoxifen which she initially started 08/2022 after she weaned off Prozac.   -She is not tolerating tamoxifen well, due to joint pain, hot flash, brain fog, and irritability.  She has increased Effexor to 150 mg which is not helping and she would like to go back on Prozac -I recommend she stop tamoxifen to transition back to Prozac.  I discussed alternative antiestrogens such as anastrozole, letrozole, or exemestane, given that she is postmenopausal. -I answered other questions to her satisfaction, specifically about recent labs and EGD, although I do not have the report and encouraged her to adhere to Dr. Haywood Pao recommendations and follow-up with him  -Will check in in 4-6 weeks to see how she is doing and to decide antiestrogen therapy   PLAN: -Stop Tamoxifen due to SE's (joint pain, hot flash, brain fog, irritability) -Will ask PCP to oversee transition effexor back to prozac  -Phone f/up in 4-6 weeks to discuss further anti-estrogen therapy    I discussed the assessment and treatment plan with the patient. The patient was provided an opportunity to ask questions and all were answered. The patient agreed with the plan and demonstrated an understanding of the instructions.   The patient was advised to call back or seek an in-person evaluation if the symptoms worsen or if the condition fails to improve. No barriers to learning were detected. I spent 12 minutes counseling the patient non face to face.  Santiago Glad, NP-C 02/17/2023

## 2023-02-17 NOTE — Telephone Encounter (Signed)
Office notes faxed to Dr Karna Christmas

## 2023-02-22 IMAGING — CT CT ANGIO CHEST
2 of 6 series · 17 of 36 positions shown · IV contrast (OMNIPAQUE 350)
Comparison: Portable chest obtained earlier today.

CLINICAL DATA: Shortness of breath, pharyngitis and weakness for
the past for days. Recent chemotherapy. Status post bilateral nipple
sparing mastectomy and sentinel node biopsy for left breast cancer
on 01/07/2022 with implant reconstruction. The mastectomy pathology
results showed left breast invasive ductal carcinoma and DCIS and
right breast invasive lobular carcinoma and DCIS. Previous mid chest
radiation for non-Hodgkin's lymphoma in the 1774's.

EXAM:
CT ANGIOGRAPHY CHEST WITH CONTRAST
TECHNIQUE: Multidetector CT imaging of the chest was performed using the
standard protocol during bolus administration of intravenous
contrast. Multiplanar CT image reconstructions and MIPs were
obtained to evaluate the vascular anatomy.

[Series 7: thins · axial · 0.66mm/px · z∈[-210,+10]mm · 16 of 248 slices shown]
[im 14/248  lung]
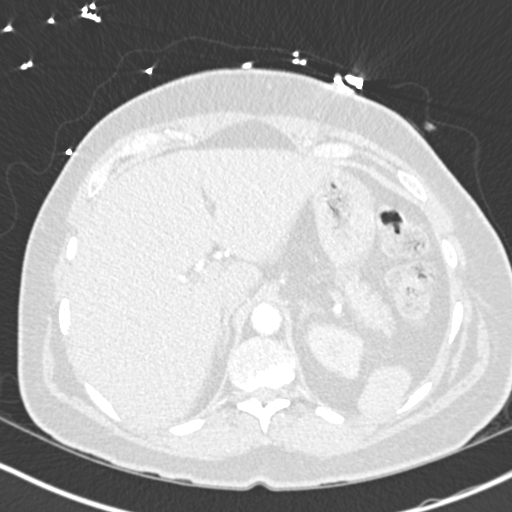
[im 28/248  mediastinal]
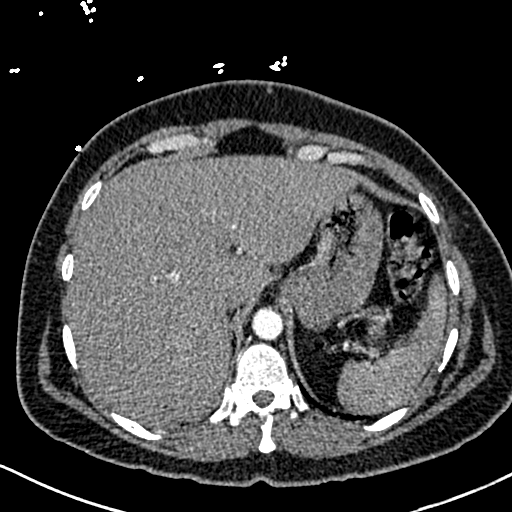
[im 42/248  lung]
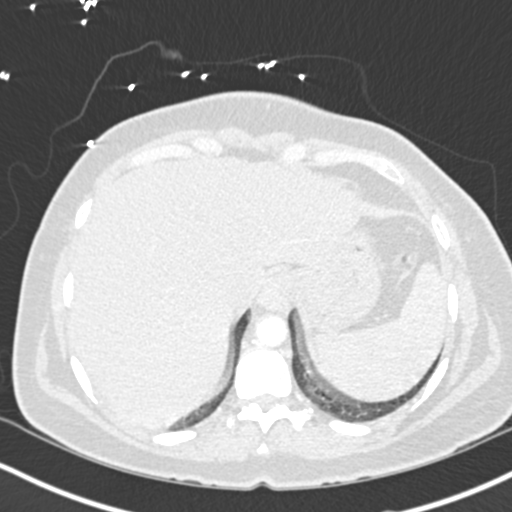
[im 55/248  mediastinal]
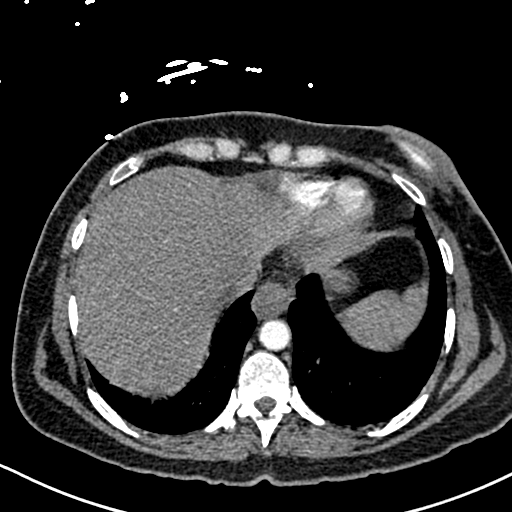
[im 69/248  lung]
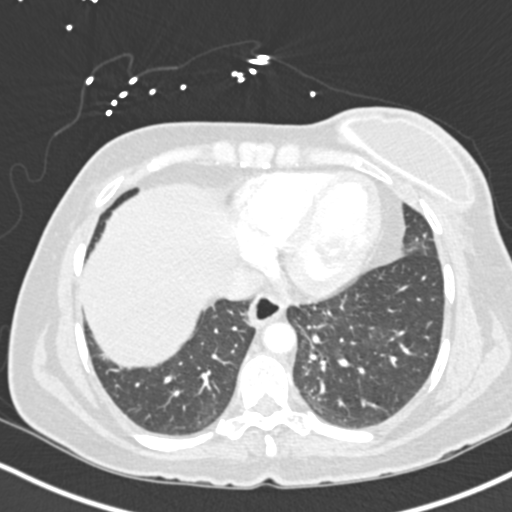
[im 83/248  mediastinal]
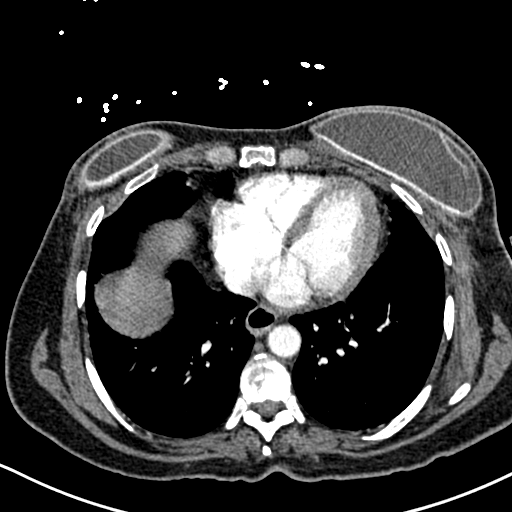
[im 97/248  lung]
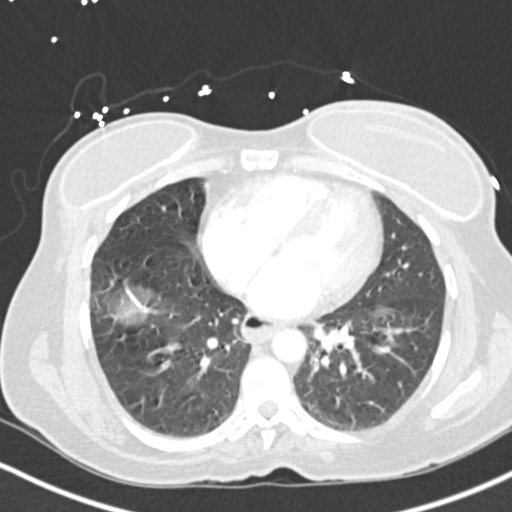
[im 110/248  mediastinal]
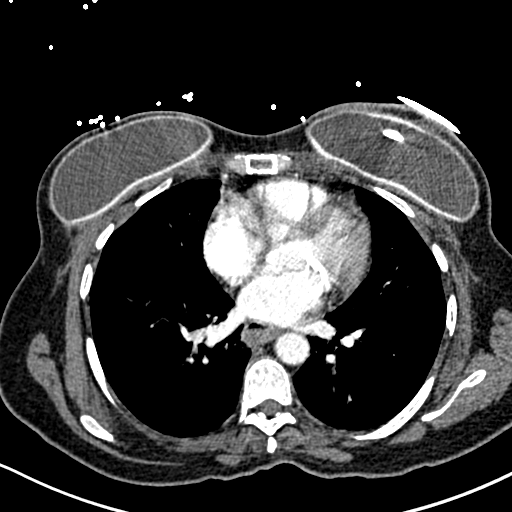
[im 138/248  lung]
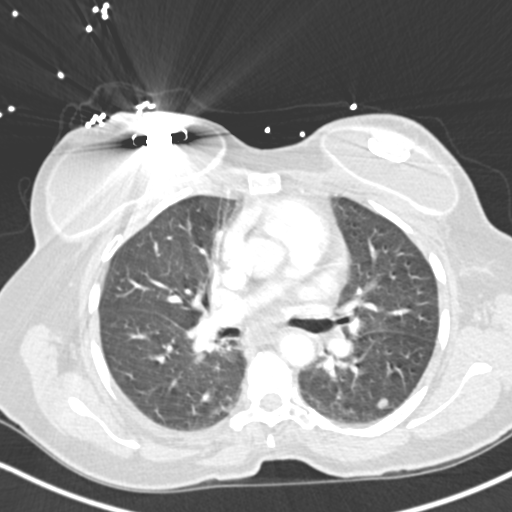
[im 151/248  mediastinal]
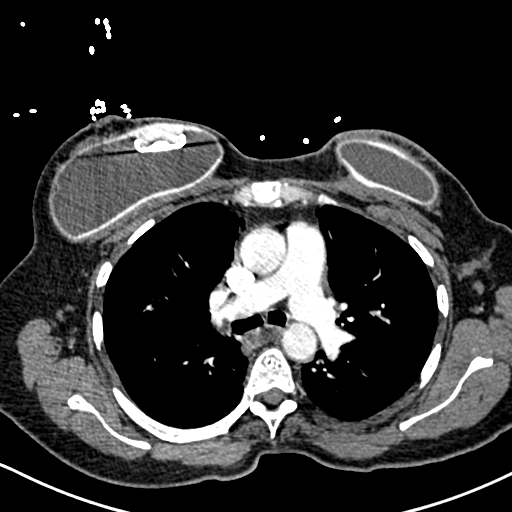
[im 165/248  lung]
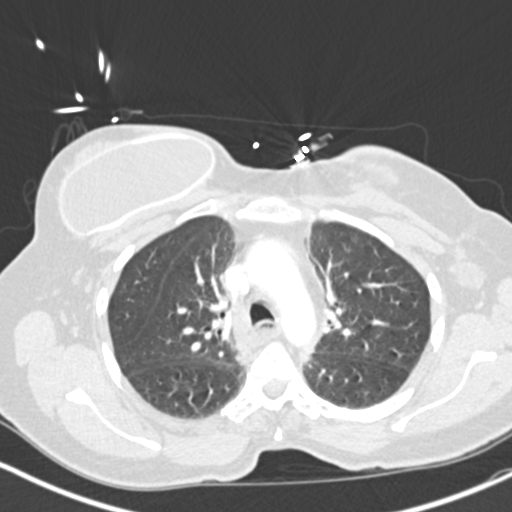
[im 179/248  mediastinal]
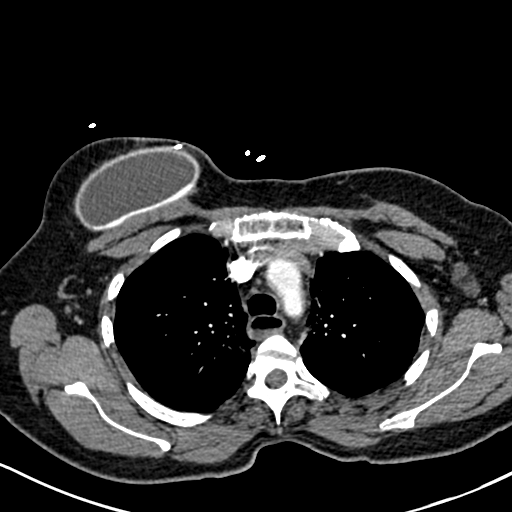
[im 193/248  lung]
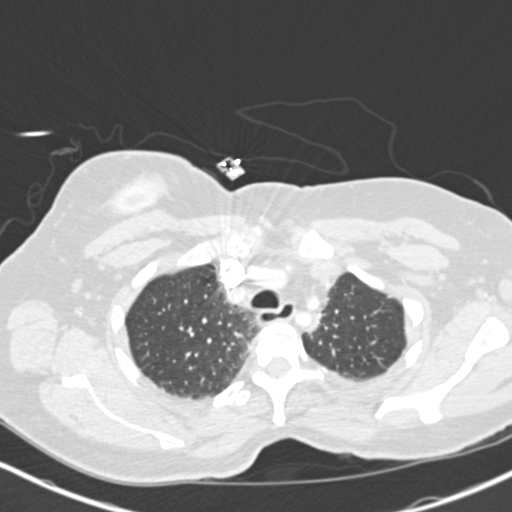
[im 206/248  mediastinal]
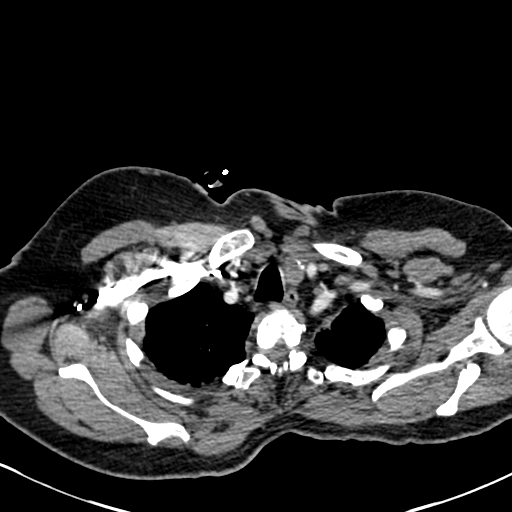
[im 220/248  lung]
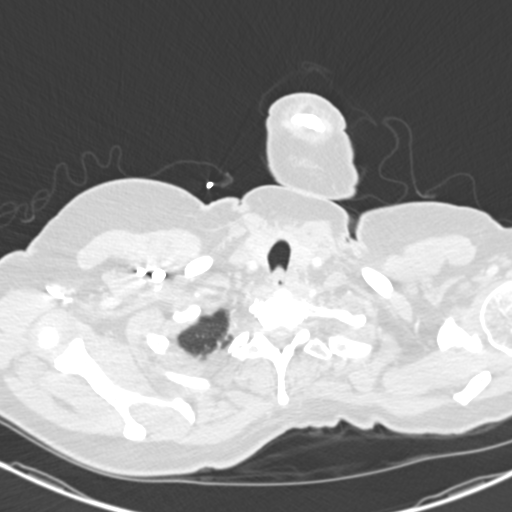
[im 234/248  mediastinal]
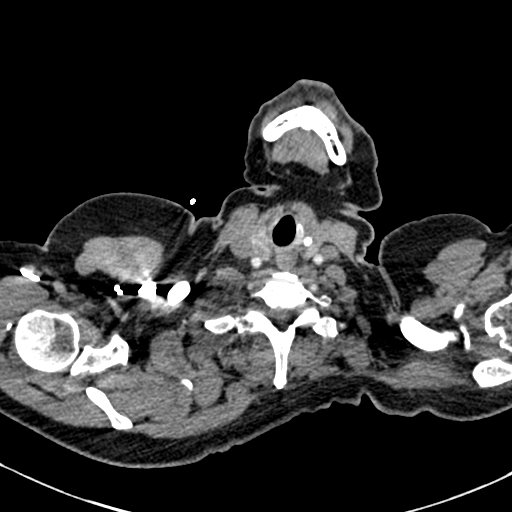

[Series 9: coronal mpr · coronal · 0.51mm/px · 1 of 145 slices shown]
[im 73/145  mediastinal]
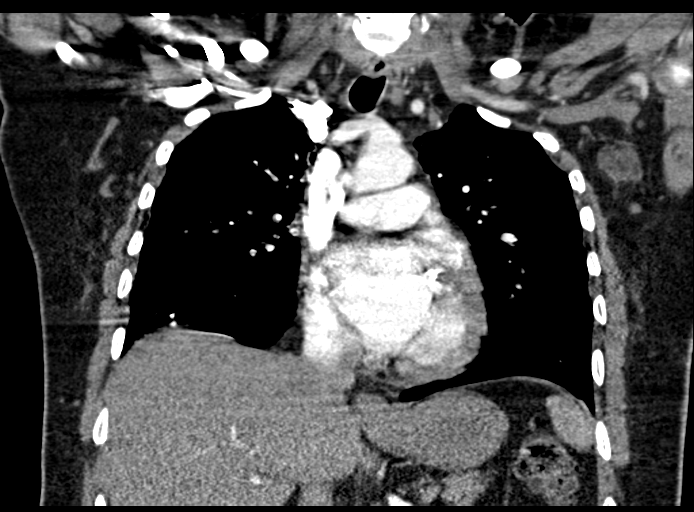

[17 of 36 positions shown; findings below may reference images not displayed]

RADIATION DOSE REDUCTION: This exam was performed according to the
departmental dose-optimization program which includes automated
exposure control, adjustment of the mA and/or kV according to
patient size and/or use of iterative reconstruction technique.

CONTRAST:  75mL OMNIPAQUE IOHEXOL 350 MG/ML SOLN
FINDINGS: Cardiovascular: Satisfactory opacification of the pulmonary arteries
to the segmental level. No evidence of pulmonary embolism. Normal
heart size. No pericardial effusion.

Mediastinum/Nodes: Peripherally calcified exophytic nodule rising
from the inferior aspect of the left lobe of the thyroid gland,
extending into a substernal location. This measures 1.8 cm in
maximum diameter on image number 45/7.

No enlarged lymph nodes. Small hiatal hernia with fluid in the mid
and distal esophagus.

Lungs/Pleura: 7 x 6 mm noncalcified nodule in the left lower lobe on
image number 56/8.

5 mm nodule in the medial aspect of the lingula on image number
80/8.

5 mm right lower lobe nodule on image number 82/8.

Mild cylindrical bronchiectasis in the medial aspects of both upper
lobes.

Upper Abdomen: Unremarkable.

Musculoskeletal: Bilateral breast implants mild thoracic and lower
cervical spine degenerative changes. No evidence of bony metastatic
disease.

Review of the MIP images confirms the above findings.
IMPRESSION: 1. No pulmonary emboli.
2. 3 small lung nodules, as described above. The most suspicious is
in the left lower lobe with a mean diameter of 6.5 mm. Given the
history of bilateral invasive breast cancer, metastatic disease is a
consideration. Therefore, recommend a follow-up chest CT with
contrast in 3 months. This is probably too small to accurately
characterize with PET-CT at this time.
3. Mild cylindrical bronchiectasis in the medial aspects of both
upper lobes, most likely representing postradiation changes.
4. Small hiatal hernia with evidence of gastroesophageal reflux with
associated fluid in the mid and distal esophagus.

## 2023-03-16 ENCOUNTER — Telehealth: Payer: Self-pay | Admitting: Gastroenterology

## 2023-03-16 NOTE — Telephone Encounter (Signed)
Inbound call from patient, states Dr. Elnoria Howard has sent in a referral for a TIF procedure. Patient would like to get scheduling process initiated. Please advise.

## 2023-03-16 NOTE — Telephone Encounter (Signed)
Referral records are under Media for review. Please advise on scheduling. Thanks

## 2023-03-16 NOTE — Progress Notes (Signed)
[facility]   Telephone:([phone] Fax:([phone]) [phone]   Clinic Follow up Note   Patient Care Team: Galvin Proffer, MD as PCP - General (Internal Medicine) Harriette Bouillon, MD as Consulting Physician (General Surgery) Malachy Mood, MD as Consulting Physician (Hematology) Antony Blackbird, MD as Consulting Physician (Radiation Oncology)  Date of Service:  03/17/2023  I connected with Erika Hodge on 03/17/2023 at  9:40 AM EDT by telephone visit and verified that I am speaking with the correct person using two identifiers.  I discussed the limitations, risks, security and privacy concerns of performing an evaluation and management service by telephone and the availability of in person appointments. I also discussed with the patient that there may be a patient responsible charge related to this service. The patient expressed understanding and agreed to proceed.   Other persons participating in the visit and their role in the encounter:  No  Patient's location:  Home Provider's location:  [facility] office  CHIEF COMPLAINT: f/u of  bilateral breast cancer   CURRENT THERAPY:  - Anti-estrogen therapy with Tamoxifen, starting 08/2022 , and stop due to side effects 02/2023 Exemestane to start 04/2023  ASSESSMENT & PLAN:  Erika Hodge is a 57 y.o. female with    Malignant neoplasm of upper-inner quadrant of left breast in female, estrogen receptor positive (HCC) -found on screening mammogram. 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 -she opted to proceed with b/l mastectomies with reconstruction on 01/07/22 with Dr. Luisa Hart and Dr. Leta Baptist. Pathology revealed b/l breast cancers. Left breast showed 2.1 cm invasive and in situ ductal carcinoma. Margins and lymph nodes negative (0/2). Repeated ER/PR/HER2 were all negative (ER 10% weak (+) on biopsy) -she developed left IMF necrotic tissue, excised by Dr. Leta Baptist on 01/27/22. -given prior treatment with adriamycin, she  completed 4 cycles TC 02/12/22 - 06/06/22, tolerated fairly  -we reviewed that adjuvant radiation is not recommended due to her previous radiation for lymphoma.  Invasive lobular carcinoma of right breast, stage 1 (HCC) stage I pT1c, ER weakly+/PR+/Her2-, and DCIS  -incidental finding on b/l mastectomies on 01/07/22 for left breast cancer. Pathology from right breast showed: 1.1 cm invasive lobular carcinoma, grade 1, in LIQ; 1 cm DCIS, intermediate grade, in UIQ. ILC showed ER 30% weakly positive, PR 80% moderately positive, Her2 negative. ILC focally involves adjacent anterior margin.  -She has tried low-dose and full dose tamoxifen which she initially started 08/2022 after she weaned off Prozac.   -She is not tolerating tamoxifen well, due to joint pain, hot flash, brain fog, and irritability.  She has increased Effexor to 150 mg which is not helping and she would like to go back on Prozac.  Tamoxifen was stopped in early April 2024. -We discussed the option of AI, and I encouraged her to try.  Her FSH in March 2024 showed she is postmenopausal.  I discussed the benefit and potential side effect from AI with her in detail, she agrees to try exemestane.  If she does not have significant side effect from AI, I will stop it.  PLAN: - recommend another antiestrogen therapy Exemestane for 5 years, medication called in today, she wants to wait until her esophageal issue resolves before she starts. -lab and f/u 6/13 scheduled, OK to postpone if she does not start as semustine before June 13.   SUMMARY OF ONCOLOGIC HISTORY: Oncology History Overview Note   Cancer Staging  Invasive lobular carcinoma of right breast, stage 1 (HCC) Staging  form: Breast, AJCC 8th Edition - Clinical stage from 01/07/2022: Stage Unknown (cT1c, cNX, cM0, G1, ER+, PR+, HER2-) - Signed by Malachy Mood, MD on 01/24/2022  Malignant neoplasm of upper-inner quadrant of left breast in female, estrogen receptor positive (HCC) Staging  form: Breast, AJCC 8th Edition - Clinical stage from 11/20/2021: Stage IA (cT1c, cN0, cM0, G2, ER+, PR-, HER2-) - Signed by Malachy Mood, MD on 11/26/2021 - Pathologic stage from 01/07/2022: Stage IIA (pT2, pN0, cM0, G2, ER-, PR-, HER2-) - Signed by Malachy Mood, MD on 01/24/2022     Malignant neoplasm of upper-inner quadrant of left breast in female, estrogen receptor positive (HCC)  11/05/2021 Mammogram   CLINICAL DATA:  Screening recall for a possible left breast asymmetry.   EXAM: DIGITAL DIAGNOSTIC UNILATERAL LEFT MAMMOGRAM WITH TOMOSYNTHESIS AND CAD; ULTRASOUND LEFT BREAST LIMITED  IMPRESSION: 1. Irregular 1.2 cm mass in the left breast at 11 o'clock, 2 cm the nipple, suspicious for malignancy.   11/20/2021 Cancer Staging   Staging form: Breast, AJCC 8th Edition - Clinical stage from 11/20/2021: Stage IA (cT1c, cN0, cM0, G2, ER+, PR-, HER2-) - Signed by Malachy Mood, MD on 11/26/2021 Stage prefix: Initial diagnosis Histologic grading system: 3 grade system   11/20/2021 Initial Biopsy   Diagnosis Breast, left, needle core biopsy, 11 o'clock, ribbon clip - INVASIVE DUCTAL CARCINOMA - DUCTAL CARCINOMA IN SITU - SEE COMMENT Microscopic Comment based on the biopsy, the carcinoma appears Nottingham grade 2 of 3 and measures 0.6 cm in greatest linear extent.  PROGNOSTIC INDICATORS Results: The tumor cells are NEGATIVE for Her2 (1+). Estrogen Receptor: 10%, POSITIVE, WEAK STAINING INTENSITY Progesterone Receptor: 0%, NEGATIVE Proliferation Marker Ki67: 20%   11/26/2021 Initial Diagnosis   Malignant neoplasm of upper-inner quadrant of left breast in female, estrogen receptor positive (HCC)   11/27/2021 Genetic Testing   [employer] [facility] was Negative. Report date is 12/11/2021.  The CancerNext gene panel offered by [employer] includes sequencing, rearrangement analysis, and RNA analysis for the following 36 genes:   APC, ATM, AXIN2, BARD1, BMPR1A, BRCA1, BRCA2, BRIP1, CDH1, CDK4, CDKN2A,  CHEK2, DICER1, HOXB13, EPCAM, GREM1, MLH1, MSH2, MSH3, MSH6, MUTYH, NBN, NF1, NTHL1, PALB2, PMS2, POLD1, POLE, PTEN, RAD51C, RAD51D, RECQL, SMAD4, SMARCA4, STK11, and TP53.    01/07/2022 Cancer Staging   Staging form: Breast, AJCC 8th Edition - Pathologic stage from 01/07/2022: Stage IIA (pT2, pN0, cM0, G2, ER-, PR-, HER2-) - Signed by Malachy Mood, MD on 01/24/2022 Stage prefix: Initial diagnosis Histologic grading system: 3 grade system Residual tumor (R): R0 - None   01/07/2022 Definitive Surgery   FINAL MICROSCOPIC DIAGNOSIS:   A. BREAST, RIGHT NIPPLE, BIOPSY:  - Benign breast tissue.  - No malignancy identified.   B. BREAST, LEFT NIPPLE, BIOPSY:  - Benign breast tissue.  - No malignancy identified.   C. BREAST, RIGHT, MASTECTOMY:  - Invasive lobular carcinoma, 1.1 cm.  - Ductal carcinoma in situ, 1 cm.  - Invasive carcinoma focally extends to the anterior margin.  - Fibrocystic changes with usual ductal hyperplasia and calcifications.  - Sclerosing adenosis and fibroadenomatoid change.  - See oncology table and comment.   D. LYMPH NODE, LEFT AXILLARY, SENTINEL, EXCISION:  - One lymph node negative for metastatic carcinoma (0/1).   E. LYMPH NODE, LEFT AXILLARY, SENTINEL, EXCISION:  - One lymph node negative for metastatic carcinoma (0/1).   F. BREAST, LEFT, MASTECTOMY:  - Invasive and in situ ductal carcinoma, 2.1 cm.  - Margins negative for carcinoma.  - See oncology table.  01/07/2022 Receptors her2   F1. PROGNOSTIC INDICATOR RESULTS:   The tumor cells are NEGATIVE for Her2 (0).   Estrogen Receptor: NEGATIVE  Progesterone Receptor: NEGATIVE  Proliferation Marker Ki-67: 10%    02/12/2022 - 06/06/2022 Chemotherapy   Patient is on Treatment Plan : BREAST TC q21d     08/2022 -  Anti-estrogen oral therapy   Tamoxifen   Invasive lobular carcinoma of right breast, stage 1 (HCC)  11/27/2021 Genetic Testing   [employer] [facility] was Negative. Report date is 12/11/2021.  The  CancerNext gene panel offered by [employer] includes sequencing, rearrangement analysis, and RNA analysis for the following 36 genes:   APC, ATM, AXIN2, BARD1, BMPR1A, BRCA1, BRCA2, BRIP1, CDH1, CDK4, CDKN2A, CHEK2, DICER1, HOXB13, EPCAM, GREM1, MLH1, MSH2, MSH3, MSH6, MUTYH, NBN, NF1, NTHL1, PALB2, PMS2, POLD1, POLE, PTEN, RAD51C, RAD51D, RECQL, SMAD4, SMARCA4, STK11, and TP53.    01/07/2022 Cancer Staging   Staging form: Breast, AJCC 8th Edition - Clinical stage from 01/07/2022: Stage Unknown (cT1c, cNX, cM0, G1, ER+, PR+, HER2-) - Signed by Malachy Mood, MD on 01/24/2022 Stage prefix: Initial diagnosis Histologic grading system: 3 grade system   01/07/2022 Definitive Surgery   FINAL MICROSCOPIC DIAGNOSIS:   A. BREAST, RIGHT NIPPLE, BIOPSY:  - Benign breast tissue.  - No malignancy identified.   B. BREAST, LEFT NIPPLE, BIOPSY:  - Benign breast tissue.  - No malignancy identified.   C. BREAST, RIGHT, MASTECTOMY:  - Invasive lobular carcinoma, 1.1 cm.  - Ductal carcinoma in situ, 1 cm.  - Invasive carcinoma focally extends to the anterior margin.  - Fibrocystic changes with usual ductal hyperplasia and calcifications.  - Sclerosing adenosis and fibroadenomatoid change.  - See oncology table and comment.   D. LYMPH NODE, LEFT AXILLARY, SENTINEL, EXCISION:  - One lymph node negative for metastatic carcinoma (0/1).   E. LYMPH NODE, LEFT AXILLARY, SENTINEL, EXCISION:  - One lymph node negative for metastatic carcinoma (0/1).   F. BREAST, LEFT, MASTECTOMY:  - Invasive and in situ ductal carcinoma, 2.1 cm.  - Margins negative for carcinoma.  - See oncology table.    01/07/2022 Receptors her2   C7.  PROGNOSTIC INDICATOR RESULTS:   The tumor cells are NEGATIVE for Her2 (1+).   Estrogen Receptor: POSITIVE, 30% WEAK STAINING INTENSITY  Progesterone Receptor: POSITIVE, 80% MODERATE STAINING INTENSITY  Proliferation Marker Ki-67: <5%    01/24/2022 Initial Diagnosis   Invasive lobular  carcinoma of right breast, stage 1 (HCC)   08/2022 -  Anti-estrogen oral therapy   Tamoxifen      INTERVAL HISTORY:  Erika Hodge was contacted for a follow up of  bilateral breast cancer. She was last seen by NP Lacie on 02/17/2023. Pt state since stopping Tamoxifen a month ago she has a lot more energy. She reports of a sharp shooting pain in the right index finger. Pt state that the pain has been going on for more than 4 wks.Pt state the hot flashes has gotten better. Pt report    All other systems were reviewed with the patient and are negative.  MEDICAL HISTORY:  Past Medical History:  Diagnosis Date   Anxiety    Complication of anesthesia    HA   Dyspnea    due to chemo   GERD (gastroesophageal reflux disease)    Non Hodgkin's lymphoma (HCC)    Psoriasis     SURGICAL HISTORY: Past Surgical History:  Procedure Laterality Date   BREAST BIOPSY Left 11/20/2021   BREAST RECONSTRUCTION  WITH PLACEMENT OF TISSUE EXPANDER AND ALLODERM Bilateral 01/07/2022   Procedure: BILATERAL BREAST RECONSTRUCTION WITH PLACEMENT OF TISSUE EXPANDERS AND ALLODERM;  Surgeon: Glenna Fellows, MD;  Location: [facility];  Service: Plastics;  Laterality: Bilateral;   NIPPLE SPARING MASTECTOMY Right 01/07/2022   Procedure: RIGHT NIPPLE SPARING MASTECTOMY;  Surgeon: Harriette Bouillon, MD;  Location: [facility];  Service: General;  Laterality: Right;   NIPPLE SPARING MASTECTOMY WITH SENTINEL LYMPH NODE BIOPSY Left 01/07/2022   Procedure: LEFT NIPPLE SPARING MASTECTOMY WITH SENTINEL LYMPH NODE BIOPSY;  Surgeon: Harriette Bouillon, MD;  Location: [facility];  Service: General;  Laterality: Left;   REMOVAL OF BILATERAL TISSUE EXPANDERS WITH PLACEMENT OF BILATERAL BREAST IMPLANTS Bilateral 07/15/2022   Procedure: REMOVAL OF BILATERAL TISSUE EXPANDERS WITH PLACEMENT OF BILATERAL BREAST IMPLANTS;  Surgeon: Glenna Fellows, MD;  Location: [facility];   Service: Plastics;  Laterality: Bilateral;   TUBAL LIGATION      I have reviewed the social history and family history with the patient and they are unchanged from previous note.  ALLERGIES:  is allergic to amoxicillin, ibuprofen, ciprofloxacin, and codeine.  MEDICATIONS:  Current Outpatient Medications  Medication Sig Dispense Refill   exemestane (AROMASIN) 25 MG tablet Take 1 tablet (25 mg total) by mouth daily after breakfast. 30 tablet 2   ALPRAZolam (XANAX) 0.5 MG tablet Take 0.5 mg by mouth daily as needed for anxiety or sleep.     omeprazole (PRILOSEC) 40 MG capsule Take 40 mg by mouth daily.     rOPINIRole (REQUIP) 0.5 MG tablet TAKE 1 TABLET BY MOUTH EVERYDAY AT BEDTIME 30 tablet 0   venlafaxine XR (EFFEXOR-XR) 75 MG 24 hr capsule Take 1 capsule (75 mg total) by mouth daily with breakfast. 90 capsule 1   VENTOLIN HFA 108 (90 Base) MCG/ACT inhaler TAKE 2 PUFFS BY MOUTH EVERY 6 HOURS AS NEEDED FOR WHEEZE OR SHORTNESS OF BREATH 18 each 2   zolpidem (AMBIEN) 10 MG tablet Take 10 mg by mouth at bedtime as needed for sleep.     No current facility-administered medications for this visit.    PHYSICAL EXAMINATION: ECOG PERFORMANCE STATUS: 1 - Symptomatic but completely ambulatory  There were no vitals filed for this visit. Wt Readings from Last 3 Encounters:  01/21/23 186 lb 1.6 oz (84.4 kg)  01/05/23 183 lb 2 oz (83.1 kg)  09/30/22 182 lb (82.6 kg)    No vitals taken today, Exam not performed today  LABORATORY DATA:  I have reviewed the data as listed    Latest Ref Rng & Units 01/21/2023    9:59 AM 09/30/2022    1:53 PM 07/09/2022    1:06 PM  CBC  WBC 4.0 - 10.5 K/uL 6.8  6.0  5.8   Hemoglobin 12.0 - 15.0 g/dL 16.1  09.6  04.5   Hematocrit 36.0 - 46.0 % 42.1  41.8  36.3   Platelets 150 - 400 K/uL 325  343  405         Latest Ref Rng & Units 01/21/2023    9:59 AM 09/30/2022    1:53 PM 07/09/2022    1:06 PM  CMP  Glucose 70 - 99 mg/dL 409  811  98   BUN 6 - 20 mg/dL  19  16  16    Creatinine 0.44 - 1.00 mg/dL 9.14  7.82  9.56   Sodium 135 - 145 mmol/L 136  136  139   Potassium 3.5 - 5.1 mmol/L  4.1  4.1  4.4   Chloride 98 - 111 mmol/L 102  104  105   CO2 22 - 32 mmol/L 27  26  30    Calcium 8.9 - 10.3 mg/dL 9.5  16.1  9.9   Total Protein 6.5 - 8.1 g/dL 7.5  7.5  7.1   Total Bilirubin 0.3 - 1.2 mg/dL 0.3  0.2  0.3   Alkaline Phos 38 - 126 U/L 87  62  59   AST 15 - 41 U/L 18  22  22    ALT 0 - 44 U/L 15  19  17        RADIOGRAPHIC STUDIES: I have personally reviewed the radiological images as listed and agreed with the findings in the report. No results found.    No orders of the defined types were placed in this encounter.  All questions were answered. The patient knows to call the clinic with any problems, questions or concerns. No barriers to learning was detected. The total time spent in the appointment was 15 minutes.     Malachy Mood, MD 03/17/2023   Carolin Coy am acting as scribe for Malachy Mood, MD.   I have reviewed the above documentation for accuracy and completeness, and I agree with the above.

## 2023-03-17 ENCOUNTER — Inpatient Hospital Stay: Payer: BC Managed Care – PPO | Attending: Hematology | Admitting: Hematology

## 2023-03-17 ENCOUNTER — Encounter: Payer: Self-pay | Admitting: Hematology

## 2023-03-17 DIAGNOSIS — Z170 Estrogen receptor positive status [ER+]: Secondary | ICD-10-CM

## 2023-03-17 DIAGNOSIS — C50911 Malignant neoplasm of unspecified site of right female breast: Secondary | ICD-10-CM

## 2023-03-17 DIAGNOSIS — C50212 Malignant neoplasm of upper-inner quadrant of left female breast: Secondary | ICD-10-CM

## 2023-03-17 MED ORDER — EXEMESTANE 25 MG PO TABS: 25.0000 mg | ORAL_TABLET | Freq: Every day | ORAL | 2 refills | Status: DC

## 2023-03-17 NOTE — Assessment & Plan Note (Signed)
-  found on screening mammogram. 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 -she opted to proceed with b/l mastectomies with reconstruction on 01/07/22 with Dr. Luisa Hart and Dr. Leta Baptist. Pathology revealed b/l breast cancers. Left breast showed 2.1 cm invasive and in situ ductal carcinoma. Margins and lymph nodes negative (0/2). Repeated ER/PR/HER2 were all negative (ER 10% weak (+) on biopsy) -she developed left IMF necrotic tissue, excised by Dr. Leta Baptist on 01/27/22. -given prior treatment with adriamycin, she completed 4 cycles TC 02/12/22 - 06/06/22, tolerated fairly  -we reviewed that adjuvant radiation is not recommended due to her previous radiation for lymphoma.

## 2023-03-17 NOTE — Assessment & Plan Note (Addendum)
stage I pT1c, ER weakly+/PR+/Her2-, and DCIS  -incidental finding on b/l mastectomies on 01/07/22 for left breast cancer. Pathology from right breast showed: 1.1 cm invasive lobular carcinoma, grade 1, in LIQ; 1 cm DCIS, intermediate grade, in UIQ. ILC showed ER 30% weakly positive, PR 80% moderately positive, Her2 negative. ILC focally involves adjacent anterior margin.  -She has tried low-dose and full dose tamoxifen which she initially started 08/2022 after she weaned off Prozac.   -She is not tolerating tamoxifen well, due to joint pain, hot flash, brain fog, and irritability.  She has increased Effexor to 150 mg which is not helping and she would like to go back on Prozac.  Tamoxifen was stopped in early April 2024.

## 2023-03-19 ENCOUNTER — Encounter: Payer: Self-pay | Admitting: Gastroenterology

## 2023-03-24 ENCOUNTER — Ambulatory Visit: Payer: BC Managed Care – PPO | Admitting: Gastroenterology

## 2023-03-24 VITALS — BP 130/78 | HR 95 | Ht 65.0 in | Wt 182.0 lb

## 2023-03-24 DIAGNOSIS — K449 Diaphragmatic hernia without obstruction or gangrene: Secondary | ICD-10-CM | POA: Diagnosis not present

## 2023-03-24 DIAGNOSIS — C50212 Malignant neoplasm of upper-inner quadrant of left female breast: Secondary | ICD-10-CM

## 2023-03-24 DIAGNOSIS — C50911 Malignant neoplasm of unspecified site of right female breast: Secondary | ICD-10-CM

## 2023-03-24 DIAGNOSIS — R1110 Vomiting, unspecified: Secondary | ICD-10-CM | POA: Diagnosis not present

## 2023-03-24 DIAGNOSIS — K2100 Gastro-esophageal reflux disease with esophagitis, without bleeding: Secondary | ICD-10-CM

## 2023-03-24 DIAGNOSIS — Z170 Estrogen receptor positive status [ER+]: Secondary | ICD-10-CM

## 2023-03-24 NOTE — Progress Notes (Signed)
Chief Complaint: GERD with erosive esophagitis, hiatal hernia   Referring Provider:     Dr. Jeani Hawking    HPI:     Erika Hodge is a 57 y.o. female with a history of breast cancer,  referred to me by Dr. Jeani Hawking for evaluation of possible antireflux intervention with Transoral Incisionless Fundoplication (TIF) with a goal to stop or significantly reduce acid suppression therapy.  Following her most recent upper endoscopy on 02/12/2023 which showed LA grade D esophagitis and 3 cm HH, she was started on Voquenza 20 mg daily with near resolution of reflux symptoms.  However, continues to have regurgitation and sore throat.  Regurgitation worse with overeating, forward flexion.  Has had choking sensation with regurgitation.  Has had reflux for many years.   Finished chemo in 05/2022.  Recently stopped tamoxifen due to ADR's.  GERD history: -Index symptoms: Heartburn, regurgitation.  Intermittent dysphagia, nausea/vomiting -Exacerbating features: Overeating -Medications trialed: Prilosec, Nexium, Tums -Current medications: Voquenza 20 mg daily -Complications: Hiatal hernia, EE  GERD evaluation: -Last EGD: 02/12/2023 -Barium esophagram: Ordered today -Esophageal Manometry: None -pH/Impedance: None -Bravo: None  Endoscopic History: - 11/19/2015: EGD (Dr. Loreta Ave): LA Grade C esophagitis, longitudinal furrows in the middle/lower esophagus with EOE score 1 (path: Normal, no elevated eosinophils).  Medium size hiatal hernia.  Moderate gastritis, normal duodenum - 04/07/2018: Colonoscopy (Dr. Loreta Ave): 2 sigmoid HP's, and 2 proximal ascending colon adenomas, 7 mm mid sigmoid polyp removed with hot snare (path: HP).  Normal TI.  Repeat in 5 years - 02/12/2023: EGD (Dr. Elnoria Howard): LA Grade D esophagitis, 3 cm HH.  Hill grade 2 valve.  Normal stomach and duodenum.  Started on Voquenza   GERD-HRQL Questionnaire Score: 18/50  Reviewed most recent labs from [facility] -  01/21/2023: Normal CMP, iron panel (ferritin 15 though), CBC  Past Medical History:  Diagnosis Date   Anemia    Anxiety    Cancer (HCC)    Complication of anesthesia    HA   Depression    Dyspnea    due to chemo   Elevated cholesterol    GERD (gastroesophageal reflux disease)    Non Hodgkin's lymphoma (HCC)    Psoriasis      Past Surgical History:  Procedure Laterality Date   BREAST BIOPSY Left 11/20/2021   BREAST RECONSTRUCTION WITH PLACEMENT OF TISSUE EXPANDER AND ALLODERM Bilateral 01/07/2022   Procedure: BILATERAL BREAST RECONSTRUCTION WITH PLACEMENT OF TISSUE EXPANDERS AND ALLODERM;  Surgeon: Glenna Fellows, MD;  Location: [facility];  Service: Plastics;  Laterality: Bilateral;   COLONOSCOPY  04/07/2018   [facility]. Dr. Charna Elizabeth. Four small polyps, 2 in the sigmoid colon and 2 in teh proximal ascending colon - removed by cold biopsies. One 7 mm sessile polyps in the mid sigmoid colon, removed with a hot snare x 1; resected and retrived. The examined portion of the ileum was normal. The ecamination was otherwise normal on direct and retroflexion views.   ESOPHAGOGASTRODUODENOSCOPY  11/19/2015   [facility]. Dr. Charna Elizabeth. LA Grade C reflux esophagitis. Esophageal mucosal changes suggestive of Eosinophilic esophagitis noted in the distal 2/3 of the esophagus-biopsies done. Medium-sized hiatal hernia noted on retroplexion. Moderate diffuse gastritis. Normal examined duodenum.   ESOPHAGOGASTRODUODENOSCOPY  02/12/2023   [facility]. Dr. Jeani Hawking.  LA Grade D reflux esophagitis with no bleeding. 3 cm hiatal hernia. Gastrophageal flap valve classified as Hill  Grade II ( fold present, opens with respiration). Normal stomach. Normal examined duodenum. No specimens collected.   NIPPLE SPARING MASTECTOMY Right 01/07/2022   Procedure: RIGHT NIPPLE SPARING MASTECTOMY;  Surgeon: Harriette Bouillon, MD;  Location: [facility];  Service: General;  Laterality: Right;   NIPPLE SPARING MASTECTOMY WITH SENTINEL LYMPH NODE BIOPSY Left 01/07/2022   Procedure: LEFT NIPPLE SPARING MASTECTOMY WITH SENTINEL LYMPH NODE BIOPSY;  Surgeon: Harriette Bouillon, MD;  Location: [facility];  Service: General;  Laterality: Left;   REMOVAL OF BILATERAL TISSUE EXPANDERS WITH PLACEMENT OF BILATERAL BREAST IMPLANTS Bilateral 07/15/2022   Procedure: REMOVAL OF BILATERAL TISSUE EXPANDERS WITH PLACEMENT OF BILATERAL BREAST IMPLANTS;  Surgeon: Glenna Fellows, MD;  Location: [facility];  Service: Plastics;  Laterality: Bilateral;   TUBAL LIGATION     Family History  Problem Relation Age of Onset   Breast cancer Mother        dx. 22s, metastasized   Throat cancer Father        dx. 60s   Cancer Maternal Uncle        unknown type   Lung disease Neg Hx    Liver disease Neg Hx    Colon cancer Neg Hx    Social History   Tobacco Use   Smoking status: Former    Packs/day: 0.50    Years: 15.00    Additional pack years: 0.00    Total pack years: 7.50    Types: Cigarettes    Quit date: 2016    Years since quitting: 8.3    Passive exposure: Never   Smokeless tobacco: Never  Vaping Use   Vaping Use: Never used  Substance Use Topics   Alcohol use: Yes    Comment: occas   Drug use: Never   Current Outpatient Medications  Medication Sig Dispense Refill   ALPRAZolam (XANAX) 0.5 MG tablet Take 0.5 mg by mouth daily as needed for anxiety or sleep.     exemestane (AROMASIN) 25 MG tablet Take 1 tablet (25 mg total) by mouth daily after breakfast. 30 tablet 2   rOPINIRole (REQUIP) 0.5 MG tablet TAKE 1 TABLET BY MOUTH EVERYDAY AT BEDTIME 30 tablet 0   VYVANSE 40 MG capsule Take 40 mg by mouth every morning.     zolpidem (AMBIEN) 10 MG tablet Take 10 mg by mouth at bedtime as needed for sleep.     No current facility-administered medications for this visit.   Allergies  Allergen Reactions    Amoxicillin Rash and Other (See Comments)    Throat closing. Tolerates Cefepime   Ibuprofen Other (See Comments), Nausea And Vomiting and Nausea Only   Ciprofloxacin Other (See Comments)    Facial swelling and throat swelling   Codeine Nausea Only     Review of Systems: All systems reviewed and negative except where noted in HPI.     Physical Exam:    Wt Readings from Last 3 Encounters:  03/24/23 182 lb (82.6 kg)  01/21/23 186 lb 1.6 oz (84.4 kg)  01/05/23 183 lb 2 oz (83.1 kg)    Ht 5\' 5"  (1.651 m)   Wt 182 lb (82.6 kg)   BMI 30.29 kg/m  Constitutional:  Pleasant, in no acute distress. Psychiatric: Normal mood and affect. Behavior is normal. Neurological: Alert and oriented to person place and time. Skin: Skin is warm and dry. No rashes noted.   ASSESSMENT AND PLAN;   1) GERD with erosive esophagitis 2) Hiatal hernia 3)  Regurgitation Longstanding history of GERD with worsening symptoms over time.  PPI was no longer efficacious and most recent EGD with LA Grade D esophagitis, so she was started on Voquenza with good clinical response.  She is interested in antireflux surgical options as a means to better control her reflux and potentially stop or significantly reduce the need for acid suppression therapy.  We discussed antireflux surgical options at length today, to include concomitant laparoscopic hiatal hernia repair and TIF.  Also discussed Nissen, toupee, Dor, etc.  She is interested in antireflux surgical options due to the continued bothersome nature of regurgitation and sore throat which is not as well-controlled with the Voquenza.  - EGD in mid June to evaluate for improvement/resolution of erosive esophagitis.  Otherwise, grade C and definitely grade D esophagitis are contraindications to TIF - Continue Voquenza - Continue antireflux lifestyle/dietary modifications - Plan for esophageal biopsies at time of EGD - Discussed the risks, benefits, alternatives of TIF  at length today.   - Esophagram for preoperative assessment - Plan for referral to Dr. Andrey Campanile at [facility] for consideration of concomitant laparoscopic hiatal hernia repair pending EGD and esophagram findings - Discussed the strict post-procedure diet, to include clears x24 hours then liquids x2 weeks and slow advancement to pureed, thick, then finally previous foods 6 weeks post operatively  - Discussed the activity limitations for the initial 6 weeks post operatively   Can continue following with Dr. Elnoria Howard for her other GI issues, to include plan for colonoscopy next month for polyp surveillance.  4) Breast cancer - After completion of diagnostic workup as outlined above, will confer with Dr. Mosetta Putt in the Oncology clinic prior to proceeding with any surgery    The indications, risks, and benefits of EGD were explained to the patient in detail. Risks include but are not limited to bleeding, perforation, adverse reaction to medications, and cardiopulmonary compromise. Sequelae include but are not limited to the possibility of surgery, hospitalization, and mortality. The patient verbalized understanding and wished to proceed. All questions answered, referred to scheduler. Further recommendations pending results of the exam.     Shellia Cleverly, DO, FACG  03/24/2023, 2:19 PM  Cc: Galvin Proffer, MD Jeani Hawking, MD

## 2023-03-24 NOTE — Patient Instructions (Signed)
You have been scheduled for an endoscopy. Please follow written instructions given to you at your visit today. If you use inhalers (even only as needed), please bring them with you on the day of your procedure.   You have been scheduled for a Barium Esophogram at [facility] (1st floor of the hospital) on 04/17/23 at 1000 AM. Please arrive 30 minutes prior to your appointment for registration. Make certain not to have anything to eat or drink 3 hours prior to your test. If you need to reschedule for any reason, please contact radiology at [phone] to do so. __________________________________________________________________ A barium swallow is an examination that concentrates on views of the esophagus. This tends to be a double contrast exam (barium and two liquids which, when combined, create a gas to distend the wall of the oesophagus) or single contrast (non-ionic iodine based). The study is usually tailored to your symptoms so a good history is essential. Attention is paid during the study to the form, structure and configuration of the esophagus, looking for functional disorders (such as aspiration, dysphagia, achalasia, motility and reflux) EXAMINATION You may be asked to change into a gown, depending on the type of swallow being performed. A radiologist and radiographer will perform the procedure. The radiologist will advise you of the type of contrast selected for your procedure and direct you during the exam. You will be asked to stand, sit or lie in several different positions and to hold a small amount of fluid in your mouth before being asked to swallow while the imaging is performed .In some instances you may be asked to swallow barium coated marshmallows to assess the motility of a solid food bolus. The exam can be recorded as a digital or video fluoroscopy procedure. POST PROCEDURE It will take 1-2 days for the barium to pass through your system. To facilitate this, it is  important, unless otherwise directed, to increase your fluids for the next 24-48hrs and to resume your normal diet.  This test typically takes about 30 minutes to perform. __________________________________________________________________________________  _______________________________________________________  If your blood pressure at your visit was 140/90 or greater, please contact your primary care physician to follow up on this.  _______________________________________________________  If you are age 25 or younger, your body mass index should be between 19-25. Your Body mass index is 30.29 kg/m. If this is out of the aformentioned range listed, please consider follow up with your Primary Care Provider.   __________________________________________________________  The [facility] providers would like to encourage you to use [facility] to communicate with providers for non-urgent requests or questions.  Due to long hold times on the telephone, sending your provider a message by [facility] may be a faster and more efficient way to get a response.  Please allow 48 business hours for a response.  Please remember that this is for non-urgent requests.   Due to recent changes in healthcare laws, you may see the results of your imaging and laboratory studies on MyChart before your provider has had a chance to review them.  We understand that in some cases there may be results that are confusing or concerning to you. Not all laboratory results come back in the same time frame and the provider may be waiting for multiple results in order to interpret others.  Please give [place] 48 hours in order for your provider to thoroughly review all the results before contacting the office for clarification of your results.    Thank you for choosing me  and [facility].  Vito Cirigliano, D.O.

## 2023-04-02 ENCOUNTER — Ambulatory Visit: Payer: BC Managed Care – PPO

## 2023-04-17 ENCOUNTER — Telehealth: Payer: Self-pay

## 2023-04-17 ENCOUNTER — Ambulatory Visit (HOSPITAL_COMMUNITY)
Admission: RE | Admit: 2023-04-17 | Discharge: 2023-04-17 | Disposition: A | Discharge status: Home / Self Care | Payer: BC Managed Care – PPO | Source: Ambulatory Visit | Attending: Gastroenterology | Admitting: Gastroenterology

## 2023-04-17 DIAGNOSIS — K2100 Gastro-esophageal reflux disease with esophagitis, without bleeding: Secondary | ICD-10-CM | POA: Insufficient documentation

## 2023-04-17 DIAGNOSIS — K449 Diaphragmatic hernia without obstruction or gangrene: Secondary | ICD-10-CM | POA: Insufficient documentation

## 2023-04-17 NOTE — Telephone Encounter (Signed)
Patient made aware of  her esophagram result. Referral placed to Dr. Andrey Campanile.

## 2023-04-17 NOTE — Telephone Encounter (Signed)
-----   Message from [facility] V, DO sent at 04/17/2023 11:25 AM EDT ----- Results from the esophagram reviewed and notable for a small hiatal hernia and multiple episodes of spontaneous gastroesophageal reflux when in the supine position, with refluxate reaching the proximal third of the esophagus.  Otherwise normal esophageal motility and no strictures.  Normal-appearing stomach and proximal small bowel with normal emptying of contrast from stomach into small amount.  Given the significant reflux and regurgitation noted on this study I think antireflux surgery is a very reasonable option.  We have been noted on this and previous endoscopies, would recommend for concomitant laparoscopic hiatal hernia repair with TIF.  Plan to proceed with repeat upper endoscopy later this month as scheduled to evaluate for resolution of the previously severe erosive esophagitis while on Voquenza.  If not already scheduled, patient needs an appointment with Dr. Andrey Campanile in the surgical clinic to discuss the concomitant laparoscopic hiatal hernia repair.

## 2023-04-22 ENCOUNTER — Ambulatory Visit: Payer: BC Managed Care – PPO | Admitting: Hematology

## 2023-04-22 ENCOUNTER — Other Ambulatory Visit: Payer: BC Managed Care – PPO

## 2023-04-23 ENCOUNTER — Ambulatory Visit: Payer: BC Managed Care – PPO | Admitting: Hematology

## 2023-04-23 ENCOUNTER — Other Ambulatory Visit: Payer: BC Managed Care – PPO

## 2023-04-27 ENCOUNTER — Ambulatory Visit: Payer: BC Managed Care – PPO | Attending: Surgery

## 2023-04-27 VITALS — Wt 180.0 lb

## 2023-04-27 DIAGNOSIS — Z483 Aftercare following surgery for neoplasm: Secondary | ICD-10-CM | POA: Insufficient documentation

## 2023-04-27 NOTE — Therapy (Signed)
OUTPATIENT PHYSICAL THERAPY SOZO SCREENING NOTE   Patient Name: Erika Hodge MRN: [id number] DOB:09-26-1966, 57 y.o., female Today's Date: 04/27/2023  PCP: Galvin Proffer, MD REFERRING PROVIDER: Harriette Bouillon, MD   PT End of Session - 04/27/23 1508     Visit Number 22   # unchanged due to screen only   PT Start Time 1504    PT Stop Time 1508    PT Time Calculation (min) 4 min    Activity Tolerance Patient tolerated treatment well    Behavior During Therapy WFL for tasks assessed/performed             Past Medical History:  Diagnosis Date   Anemia    Anxiety    Cancer (HCC)    Complication of anesthesia    HA   Depression    Dyspnea    due to chemo   Elevated cholesterol    GERD (gastroesophageal reflux disease)    Non Hodgkin's lymphoma (HCC)    Psoriasis    Past Surgical History:  Procedure Laterality Date   BREAST BIOPSY Left 11/20/2021   BREAST RECONSTRUCTION WITH PLACEMENT OF TISSUE EXPANDER AND ALLODERM Bilateral 01/07/2022   Procedure: BILATERAL BREAST RECONSTRUCTION WITH PLACEMENT OF TISSUE EXPANDERS AND ALLODERM;  Surgeon: Glenna Fellows, MD;  Location: [facility];  Service: Plastics;  Laterality: Bilateral;   COLONOSCOPY  04/07/2018   [facility]. Dr. Charna Elizabeth. Four small polyps, 2 in the sigmoid colon and 2 in teh proximal ascending colon - removed by cold biopsies. One 7 mm sessile polyps in the mid sigmoid colon, removed with a hot snare x 1; resected and retrived. The examined portion of the ileum was normal. The ecamination was otherwise normal on direct and retroflexion views.   ESOPHAGOGASTRODUODENOSCOPY  11/19/2015   [facility]. Dr. Charna Elizabeth. LA Grade C reflux esophagitis. Esophageal mucosal changes suggestive of Eosinophilic esophagitis noted in the distal 2/3 of the esophagus-biopsies done. Medium-sized hiatal hernia noted on retroplexion. Moderate diffuse gastritis. Normal examined duodenum.    ESOPHAGOGASTRODUODENOSCOPY  02/12/2023   [facility]. Dr. Jeani Hawking.  LA Grade D reflux esophagitis with no bleeding. 3 cm hiatal hernia. Gastrophageal flap valve classified as Hill Grade II ( fold present, opens with respiration). Normal stomach. Normal examined duodenum. No specimens collected.   NIPPLE SPARING MASTECTOMY Right 01/07/2022   Procedure: RIGHT NIPPLE SPARING MASTECTOMY;  Surgeon: Harriette Bouillon, MD;  Location: [facility];  Service: General;  Laterality: Right;   NIPPLE SPARING MASTECTOMY WITH SENTINEL LYMPH NODE BIOPSY Left 01/07/2022   Procedure: LEFT NIPPLE SPARING MASTECTOMY WITH SENTINEL LYMPH NODE BIOPSY;  Surgeon: Harriette Bouillon, MD;  Location: [facility];  Service: General;  Laterality: Left;   REMOVAL OF BILATERAL TISSUE EXPANDERS WITH PLACEMENT OF BILATERAL BREAST IMPLANTS Bilateral 07/15/2022   Procedure: REMOVAL OF BILATERAL TISSUE EXPANDERS WITH PLACEMENT OF BILATERAL BREAST IMPLANTS;  Surgeon: Glenna Fellows, MD;  Location: [facility];  Service: Plastics;  Laterality: Bilateral;   TUBAL LIGATION     Patient Active Problem List   Diagnosis Date Noted   Hypokalemia 04/08/2022   Anemia of chronic disease 04/08/2022   Restless leg syndrome 04/08/2022   Obesity (BMI 30-39.9) 04/08/2022   Bronchiectasis without complication (HCC) 04/07/2022   Neutropenic fever (HCC) 04/07/2022   Pulmonary nodules 04/07/2022   Anxiety    Hiatal hernia with GERD    Invasive lobular carcinoma of right breast, stage 1 (HCC) 01/24/2022   Genetic  testing 12/11/2021   Family history of breast cancer 11/29/2021   Malignant neoplasm of upper-inner quadrant of left breast in female, estrogen receptor positive (HCC) 11/26/2021    REFERRING DIAG: left breast cancer at risk for lymphedema  THERAPY DIAG:  Aftercare following surgery for neoplasm  PERTINENT HISTORY: Patient was diagnosed on 09/10/2021 with left grade II  invasive ductal carcinoma breast cancer. 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 Two negative axillary nodes were removed on the left side. An incidental finding of right breast invasive lobular carcinoma was also found and is ER/PR positive and HER2 negative. It is weakly ER positive, PR negative and HER2 negative with a Ki67 of 20%. She has a left wrist plate from a previous surgery in 2006 and a history of non-Hodgkins Lymphoma in 1985.   PRECAUTIONS: left UE Lymphedema risk, None  SUBJECTIVE: Pt returns for her 3 month L-Dex screen only.   PAIN:  Are you having pain? No  SOZO SCREENING: Patient was assessed today using the SOZO machine to determine the lymphedema index score. This was compared to her baseline score. It was determined that she is within the recommended range when compared to her baseline and no further action is needed at this time. She will continue SOZO screenings. These are done every 3 months for 2 years post operatively followed by every 6 months for 2 years, and then annually.   L-DEX FLOWSHEETS - 04/27/23 1500       L-DEX LYMPHEDEMA SCREENING   Measurement Type Unilateral    L-DEX MEASUREMENT EXTREMITY Upper Extremity    POSITION  Standing    DOMINANT SIDE Right    At Risk Side Left    BASELINE SCORE (UNILATERAL) 6.4    L-DEX SCORE (UNILATERAL) 4.6    VALUE CHANGE (UNILAT) -1.8               Hermenia Bers, PTA 04/27/2023, 3:09 PM

## 2023-04-29 ENCOUNTER — Encounter: Payer: Self-pay | Admitting: Gastroenterology

## 2023-04-29 ENCOUNTER — Ambulatory Visit (AMBULATORY_SURGERY_CENTER): Payer: BC Managed Care – PPO | Admitting: Gastroenterology

## 2023-04-29 VITALS — BP 156/67 | HR 86 | Temp 96.9°F | Resp 18 | Ht 65.0 in | Wt 182.0 lb

## 2023-04-29 DIAGNOSIS — D131 Benign neoplasm of stomach: Secondary | ICD-10-CM

## 2023-04-29 DIAGNOSIS — K2100 Gastro-esophageal reflux disease with esophagitis, without bleeding: Secondary | ICD-10-CM | POA: Diagnosis not present

## 2023-04-29 DIAGNOSIS — R1110 Vomiting, unspecified: Secondary | ICD-10-CM

## 2023-04-29 DIAGNOSIS — K449 Diaphragmatic hernia without obstruction or gangrene: Secondary | ICD-10-CM

## 2023-04-29 DIAGNOSIS — K317 Polyp of stomach and duodenum: Secondary | ICD-10-CM

## 2023-04-29 MED ORDER — SODIUM CHLORIDE 0.9 % IV SOLN
500.0000 mL | INTRAVENOUS | Status: DC
Start: 2023-04-29 — End: 2023-04-29

## 2023-04-29 NOTE — Progress Notes (Signed)
Called to room to assist during endoscopic procedure.  Patient ID and intended procedure confirmed with present staff. Received instructions for my participation in the procedure from the performing physician.  

## 2023-04-29 NOTE — Op Note (Signed)
[facility] Patient Name: Erika Hodge Procedure Date: 04/29/2023 9:44 AM MRN: [id number] Endoscopist: Doristine Locks , MD, [id number] Age: 57 Referring MD:  Date of Birth: February 10, 1966 Gender: Female Account #: [id number] Procedure:                Upper GI endoscopy Indications:              Heartburn, Follow-up of reflux esophagitis,                            Preoperative assessment, Regurgitation                           EGD on 02/12/2023 by Dr. Elnoria Howard was notable for LA                            Grade D esophagitis, 3 cm HH, with otherwise normal                            stomach and duodenum. She was started on Voquenza                            with good clinical response. She presents today to                            evaluate for appropraite mucosal healing along with                            pre-operative assessment for concomitant                            laparoscopic hiatal hernia and Transoral                            Incisionless Fundoplication (cTIF). Medicines:                Monitored Anesthesia Care Procedure:                Pre-Anesthesia Assessment:                           - Prior to the procedure, a History and Physical                            was performed, and patient medications and                            allergies were reviewed. The patient's tolerance of                            previous anesthesia was also reviewed. The risks                            and benefits of the procedure and the sedation  options and risks were discussed with the patient.                            All questions were answered, and informed consent                            was obtained. Prior Anticoagulants: The patient has                            taken no anticoagulant or antiplatelet agents. ASA                            Grade Assessment: II - A patient with mild systemic                            disease. After  reviewing the risks and benefits,                            the patient was deemed in satisfactory condition to                            undergo the procedure.                           After obtaining informed consent, the endoscope was                            passed under direct vision. Throughout the                            procedure, the patient's blood pressure, pulse, and                            oxygen saturations were monitored continuously. The                            Olympus Scope [id number] was introduced through the                            mouth, and advanced to the second part of duodenum.                            The upper GI endoscopy was accomplished without                            difficulty. The patient tolerated the procedure                            well. Scope In: Scope Out: Findings:                 The upper third of the esophagus, middle third of  the esophagus and lower third of the esophagus were                            normal. Biopsies were taken with a cold forceps for                            histology. Estimated blood loss was minimal.                           The Z-line was regular and was found 33 cm from the                            incisors. The previously noted severe erosive                            esophagitis has since healed.                           A 3 cm sliding type hiatal hernia was present.                           The gastroesophageal flap valve was visualized                            endoscopically and classified as Hill Grade III                            (minimal fold, loose to endoscope, hiatal hernia                            likely).                           A single 3 mm sessile polyp was found in the                            gastric body. The polyp was removed with a cold                            biopsy forceps. Resection and retrieval were                             complete. Estimated blood loss was minimal.                           The mucosa was otherwise normal throughout the                            stomach.                           The examined duodenum was normal. Complications:            No immediate complications. Estimated Blood Loss:     Estimated blood loss was minimal.  Impression:               - Normal upper third of esophagus, middle third of                            esophagus and lower third of esophagus. Biopsied.                           - Z-line regular, 33 cm from the incisors.                           - 3 cm hiatal hernia.                           - Gastroesophageal flap valve classified as Hill                            Grade III (minimal fold, loose to endoscope, hiatal                            hernia likely).                           - A single gastric polyp. Resected and retrieved.                           - Normal mucosa was found in the entire stomach.                           - Normal examined duodenum. Recommendation:           - Patient has a contact number available for                            emergencies. The signs and symptoms of potential                            delayed complications were discussed with the                            patient. Return to normal activities tomorrow.                            Written discharge instructions were provided to the                            patient.                           - Resume previous diet.                           - Continue present medication for reflux (Voquenza).                           - Await pathology results.                           -  Keep appointment with Dr. Andrey Campanile in the Surgical                            Clinic on 05/22/2023 as scheduled. Doristine Locks, MD 04/29/2023 10:14:27 AM

## 2023-04-29 NOTE — Progress Notes (Signed)
GASTROENTEROLOGY PROCEDURE H&P NOTE   Primary Care Physician: Galvin Proffer, MD    Reason for Procedure:   GERD with erosive esophagitis, hiatal hernia, regurgitation  Plan:    EGD  Patient is appropriate for endoscopic procedure(s) in the ambulatory ([facility]) setting.  The nature of the procedure, as well as the risks, benefits, and alternatives were carefully and thoroughly reviewed with the patient. Ample time for discussion and questions allowed. The patient understood, was satisfied, and agreed to proceed.     HPI: Erika Hodge is a 57 y.o. female who presents for EGD for pre-operative evaluation for TIF and to evaluate for healing of known erosive esophagitis.  Currently treated with Voquenza by me in the office on 03/24/2023. No changes in clinical hx since then.  .  Past Medical History:  Diagnosis Date   Anemia    Anxiety    Cancer (HCC)    Complication of anesthesia    HA   Depression    Dyspnea    due to chemo   Elevated cholesterol    GERD (gastroesophageal reflux disease)    Non Hodgkin's lymphoma (HCC)    Psoriasis     Past Surgical History:  Procedure Laterality Date   BREAST BIOPSY Left 11/20/2021   BREAST RECONSTRUCTION WITH PLACEMENT OF TISSUE EXPANDER AND ALLODERM Bilateral 01/07/2022   Procedure: BILATERAL BREAST RECONSTRUCTION WITH PLACEMENT OF TISSUE EXPANDERS AND ALLODERM;  Surgeon: Glenna Fellows, MD;  Location: [facility];  Service: Plastics;  Laterality: Bilateral;   COLONOSCOPY  04/07/2018   [facility]. Dr. Charna Elizabeth. Four small polyps, 2 in the sigmoid colon and 2 in teh proximal ascending colon - removed by cold biopsies. One 7 mm sessile polyps in the mid sigmoid colon, removed with a hot snare x 1; resected and retrived. The examined portion of the ileum was normal. The ecamination was otherwise normal on direct and retroflexion views.   ESOPHAGOGASTRODUODENOSCOPY  11/19/2015   [facility]. Dr. Charna Elizabeth. LA Grade C reflux esophagitis. Esophageal mucosal changes suggestive of Eosinophilic esophagitis noted in the distal 2/3 of the esophagus-biopsies done. Medium-sized hiatal hernia noted on retroplexion. Moderate diffuse gastritis. Normal examined duodenum.   ESOPHAGOGASTRODUODENOSCOPY  02/12/2023   [facility]. Dr. Jeani Hawking.  LA Grade D reflux esophagitis with no bleeding. 3 cm hiatal hernia. Gastrophageal flap valve classified as Hill Grade II ( fold present, opens with respiration). Normal stomach. Normal examined duodenum. No specimens collected.   NIPPLE SPARING MASTECTOMY Right 01/07/2022   Procedure: RIGHT NIPPLE SPARING MASTECTOMY;  Surgeon: Harriette Bouillon, MD;  Location: [facility];  Service: General;  Laterality: Right;   NIPPLE SPARING MASTECTOMY WITH SENTINEL LYMPH NODE BIOPSY Left 01/07/2022   Procedure: LEFT NIPPLE SPARING MASTECTOMY WITH SENTINEL LYMPH NODE BIOPSY;  Surgeon: Harriette Bouillon, MD;  Location: [facility];  Service: General;  Laterality: Left;   REMOVAL OF BILATERAL TISSUE EXPANDERS WITH PLACEMENT OF BILATERAL BREAST IMPLANTS Bilateral 07/15/2022   Procedure: REMOVAL OF BILATERAL TISSUE EXPANDERS WITH PLACEMENT OF BILATERAL BREAST IMPLANTS;  Surgeon: Glenna Fellows, MD;  Location: [facility];  Service: Plastics;  Laterality: Bilateral;   TUBAL LIGATION      Prior to Admission medications   Medication Sig Start Date End Date Taking? Authorizing Provider  rOPINIRole (REQUIP) 0.5 MG tablet TAKE 1 TABLET BY MOUTH EVERYDAY AT BEDTIME 01/26/23  Yes Malachy Mood, MD  VOQUEZNA 20 MG TABS Take 1 tablet by mouth daily.  Yes [provider]  VYVANSE 40 MG capsule Take 40 mg by mouth every morning. 03/12/23  Yes [provider]  zolpidem (AMBIEN) 10 MG tablet Take 10 mg by mouth at bedtime as needed for sleep. 03/17/22  Yes [provider]  ALPRAZolam Prudy Feeler) 0.5 MG tablet  Take 0.5 mg by mouth daily as needed for anxiety or sleep. 01/02/22   [provider]  exemestane (AROMASIN) 25 MG tablet Take 1 tablet (25 mg total) by mouth daily after breakfast. 03/17/23   Malachy Mood, MD  prochlorperazine (COMPAZINE) 10 MG tablet Take 1 tablet (10 mg total) by mouth every 6 (six) hours as needed (Nausea or vomiting). 02/04/22 07/09/22  Malachy Mood, MD    Current Outpatient Medications  Medication Sig Dispense Refill   rOPINIRole (REQUIP) 0.5 MG tablet TAKE 1 TABLET BY MOUTH EVERYDAY AT BEDTIME 30 tablet 0   VOQUEZNA 20 MG TABS Take 1 tablet by mouth daily.     VYVANSE 40 MG capsule Take 40 mg by mouth every morning.     zolpidem (AMBIEN) 10 MG tablet Take 10 mg by mouth at bedtime as needed for sleep.     ALPRAZolam (XANAX) 0.5 MG tablet Take 0.5 mg by mouth daily as needed for anxiety or sleep.     exemestane (AROMASIN) 25 MG tablet Take 1 tablet (25 mg total) by mouth daily after breakfast. 30 tablet 2   Current Facility-Administered Medications  Medication Dose Route Frequency Provider Last Rate Last Admin   0.9 %  sodium chloride infusion  500 mL Intravenous Continuous Demetric Dunnaway V, DO        Allergies as of 04/29/2023 - Review Complete 04/29/2023  Allergen Reaction Noted   Amoxicillin Rash and Other (See Comments) 11/27/2021   Ibuprofen Other (See Comments), Nausea And Vomiting, and Nausea Only 07/18/2014   Ciprofloxacin Other (See Comments) 11/13/2021   Codeine Nausea Only 11/27/2021    Family History  Problem Relation Age of Onset   Breast cancer Mother        dx. 50s, metastasized   Throat cancer Father        dx. 60s   Cancer Maternal Uncle        unknown type   Lung disease Neg Hx    Liver disease Neg Hx    Colon cancer Neg Hx     Social History   Socioeconomic History   Marital status: Married    Spouse name: Not on file   Number of children: 3   Years of education: Not on file   Highest education level: Not on file  Occupational  History   Not on file  Tobacco Use   Smoking status: Former    Packs/day: 0.50    Years: 15.00    Additional pack years: 0.00    Total pack years: 7.50    Types: Cigarettes    Quit date: 2016    Years since quitting: 8.4    Passive exposure: Never   Smokeless tobacco: Never  Vaping Use   Vaping Use: Never used  Substance and Sexual Activity   Alcohol use: Yes    Comment: occas   Drug use: Never   Sexual activity: Yes    Birth control/protection: Surgical, Post-menopausal  Other Topics Concern   Not on file  Social History Narrative   Not on file   Social Determinants of Health   Financial Resource Strain: Low Risk  (11/27/2021)   Overall Financial Resource Strain (CARDIA)  Difficulty of Paying Living Expenses: Not hard at all  Food Insecurity: No Food Insecurity (11/27/2021)   Hunger Vital Sign    Worried About Running Out of Food in the Last Year: Never true    Ran Out of Food in the Last Year: Never true  Transportation Needs: No Transportation Needs (11/27/2021)   PRAPARE - Administrator, Civil Service (Medical): No    Lack of Transportation (Non-Medical): No  Physical Activity: Not on file  Stress: Not on file  Social Connections: Not on file  Intimate Partner Violence: Not on file    Physical Exam: Vital signs in last 24 hours: @BP  (!) 145/79   Pulse 89   Temp (!) 96.9 F (36.1 C)   Ht 5\' 5"  (1.651 m)   Wt 182 lb (82.6 kg)   SpO2 97%   BMI 30.29 kg/m  GEN: NAD EYE: Sclerae anicteric ENT: MMM CV: Non-tachycardic Pulm: CTA b/l GI: Soft, NT/ND NEURO:  Alert & Oriented x 3   Doristine Locks, DO [facility]   04/29/2023 9:43 AM

## 2023-04-29 NOTE — Patient Instructions (Signed)
Please read handouts provided. Continue present medications. Await pathology results. Continue present medication for reflux ( Voquenza ) . Keep appointment with Dr. Andrey Campanile in the Surgical Clinic on 05/22/2023 as scheduled.   YOU HAD AN ENDOSCOPIC PROCEDURE TODAY AT THE [facility]:   Refer to the procedure report that was given to you for any specific questions about what was found during the examination.  If the procedure report does not answer your questions, please call your gastroenterologist to clarify.  If you requested that your care partner not be given the details of your procedure findings, then the procedure report has been included in a sealed envelope for you to review at your convenience later.  YOU SHOULD EXPECT: Some feelings of bloating in the abdomen. Passage of more gas than usual.  Walking can help get rid of the air that was put into your GI tract during the procedure and reduce the bloating. If you had a lower endoscopy (such as a colonoscopy or flexible sigmoidoscopy) you may notice spotting of blood in your stool or on the toilet paper. If you underwent a bowel prep for your procedure, you may not have a normal bowel movement for a few days.  Please Note:  You might notice some irritation and congestion in your nose or some drainage.  This is from the oxygen used during your procedure.  There is no need for concern and it should clear up in a day or so.  SYMPTOMS TO REPORT IMMEDIATELY:  Following upper endoscopy (EGD)  Vomiting of blood or coffee ground material  New chest pain or pain under the shoulder blades  Painful or persistently difficult swallowing  New shortness of breath  Fever of 100F or higher  Black, tarry-looking stools  For urgent or emergent issues, a gastroenterologist can be reached at any hour by calling [phone]. Do not use MyChart messaging for urgent concerns.    DIET:  We do recommend a small meal at first, but then you may  proceed to your regular diet.  Drink plenty of fluids but you should avoid alcoholic beverages for 24 hours.  ACTIVITY:  You should plan to take it easy for the rest of today and you should NOT DRIVE or use heavy machinery until tomorrow (because of the sedation medicines used during the test).    FOLLOW UP: Our staff will call the number listed on your records the next business day following your procedure.  We will call around 7:15- 8:00 am to check on you and address any questions or concerns that you may have regarding the information given to you following your procedure. If we do not reach you, we will leave a message.     If any biopsies were taken you will be contacted by phone or by letter within the next 1-3 weeks.  Please call us at [phone] if you have not heard about the biopsies in 3 weeks.    SIGNATURES/CONFIDENTIALITY: You and/or your care partner have signed paperwork which will be entered into your electronic medical record.  These signatures attest to the fact that that the information above on your After Visit Summary has been reviewed and is understood.  Full responsibility of the confidentiality of this discharge information lies with you and/or your care-partner.

## 2023-04-29 NOTE — Progress Notes (Signed)
To pacu, VSS. Report to [email or web address] 

## 2023-04-30 ENCOUNTER — Telehealth: Payer: Self-pay

## 2023-04-30 NOTE — Telephone Encounter (Signed)
Follow up call placed, VM obtained and message left. 

## 2023-05-07 ENCOUNTER — Telehealth: Payer: Self-pay | Admitting: Gastroenterology

## 2023-05-07 NOTE — Telephone Encounter (Addendum)
Returned call to patient. Pt reports another episode of regurgitation but t felt different, when this happened she felt like her esophagus was smaller. I informed patient that no stricture was noted on recent EGD. I explained to patient that it is possible that she had an esophageal spasm. Pt feels like her esophagus is back to normal, she states that she thinks she just panicked. Denies any dysphagia. She will call us if she has any other concerns.

## 2023-05-07 NOTE — Telephone Encounter (Signed)
Inbound call from patient stating she has been vomiting. States it feels different recently, feels as though her esophagus is smaller. Requesting a call back to discuss further. Please advise, thank you.

## 2023-05-19 ENCOUNTER — Other Ambulatory Visit: Payer: Self-pay | Admitting: Hematology

## 2023-05-25 ENCOUNTER — Other Ambulatory Visit: Payer: Self-pay

## 2023-05-25 DIAGNOSIS — B9689 Other specified bacterial agents as the cause of diseases classified elsewhere: Secondary | ICD-10-CM | POA: Insufficient documentation

## 2023-05-25 DIAGNOSIS — F32A Depression, unspecified: Secondary | ICD-10-CM | POA: Insufficient documentation

## 2023-05-25 DIAGNOSIS — E559 Vitamin D deficiency, unspecified: Secondary | ICD-10-CM

## 2023-05-25 DIAGNOSIS — N920 Excessive and frequent menstruation with regular cycle: Secondary | ICD-10-CM

## 2023-05-25 DIAGNOSIS — R079 Chest pain, unspecified: Secondary | ICD-10-CM | POA: Insufficient documentation

## 2023-05-25 DIAGNOSIS — D649 Anemia, unspecified: Secondary | ICD-10-CM | POA: Insufficient documentation

## 2023-05-25 DIAGNOSIS — N760 Acute vaginitis: Secondary | ICD-10-CM | POA: Insufficient documentation

## 2023-05-25 DIAGNOSIS — L409 Psoriasis, unspecified: Secondary | ICD-10-CM | POA: Insufficient documentation

## 2023-05-25 DIAGNOSIS — C801 Malignant (primary) neoplasm, unspecified: Secondary | ICD-10-CM | POA: Insufficient documentation

## 2023-05-25 DIAGNOSIS — C8590 Non-Hodgkin lymphoma, unspecified, unspecified site: Secondary | ICD-10-CM | POA: Insufficient documentation

## 2023-05-25 DIAGNOSIS — T8859XA Other complications of anesthesia, initial encounter: Secondary | ICD-10-CM | POA: Insufficient documentation

## 2023-05-25 DIAGNOSIS — E7800 Pure hypercholesterolemia, unspecified: Secondary | ICD-10-CM | POA: Insufficient documentation

## 2023-05-25 DIAGNOSIS — R6884 Jaw pain: Secondary | ICD-10-CM | POA: Insufficient documentation

## 2023-05-25 DIAGNOSIS — R0600 Dyspnea, unspecified: Secondary | ICD-10-CM | POA: Insufficient documentation

## 2023-05-25 HISTORY — DX: Other specified bacterial agents as the cause of diseases classified elsewhere: B96.89

## 2023-05-25 HISTORY — DX: Excessive and frequent menstruation with regular cycle: N92.0

## 2023-05-25 HISTORY — DX: Vitamin D deficiency, unspecified: E55.9

## 2023-05-25 NOTE — Progress Notes (Addendum)
Cardiology Office Note:    Date:  05/26/2023   ID:  Erika Hodge, DOB December 31, 1965, MRN [id number]  PCP:  Galvin Proffer, MD  Cardiologist:  Norman Herrlich, MD   Referring MD: Gaynelle Adu, MD   07/10/2023: Update echocardiogram was normal ejection fraction and no concerning valvular abnormality She has updated and optimized for planned esophageal surgery.  Her coronary calcium score is low less than 10 and she has minimal coronary atherosclerosis no valvular calcification in my opinion is at low risk and optimized for planned elective esophageal surgery. Baldo Daub, MD 06/02/2023  ASSESSMENT:    1. Preoperative cardiovascular examination   2. Coronary artery calcification seen on CT scan   3. Atherosclerosis of aorta (HCC)   4. Calcification of mitral valve   5. Elevated cholesterol   6. History of therapeutic radiation   7. History of chemotherapy   8. Precordial pain    PLAN:    In order of problems listed above:  Because of her history of lymphoma treated early in life with thoracic radiation as well as her history of breast cancer treated with Adriamycin she is at increased cardiovascular risk and has evidence of both coronary and aortic atherosclerosis and valvular calcification on CT scan and I think she needs undergo further evaluation prior to elective esophageal surgery including echocardiogram for cardiomyopathy pericardial disease valvular disease and cardiac CTA to evaluate for obstructive CAD.  I have scheduled cardiac CTA and an echocardiogram. In general calcification of the mitral valve annulus is not associated with valve dysfunction await echocardiogram Continue her high intensity statin lipids are followed with her PCP Her symptoms are most consistent with hiatal hernia and reflux however it is unusual to have radiation to the jaw regardless presently is not having chest pain If her evaluation is reassuring I will put an addendum to this note to proceed  with her planned esophageal surgery  Next appointment 3 months   Medication Adjustments/Labs and Tests Ordered: Current medicines are reviewed at length with the patient today.  Concerns regarding medicines are outlined above.  Orders Placed This Encounter  Procedures   CT CORONARY MORPH W/CTA COR W/SCORE W/CA W/CM &/OR WO/CM   Basic Metabolic Panel (BMET)   EKG 12-Lead   ECHOCARDIOGRAM COMPLETE   Meds ordered this encounter  Medications   metoprolol tartrate (LOPRESSOR) 100 MG tablet    Sig: Take 1 tablet (100 mg total) by mouth once for 1 dose. Please take this medication 2 hours before CT    Dispense:  1 tablet    Refill:  0      History of Present Illness:    Erika Hodge is a 57 y.o. female with a history of hiatal hernia and reflux pending laparoscopic hiatal hernia repair referred to cardiology with atypical symptoms including chest and jaw pain to be seen preoperatively at the request of Gaynelle Adu, MD.  Chart review shows that she had a CT of the chest without contrast September 2023 showing mitral annular calcification coronary atherosclerosis and aortic atherosclerosis as well as stable pulmonary nodules.  She is on lipid-lowering therapy with rosuvastatin.  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  Since changing her PPI therapy she has had no further symptoms of chest pain or reflux. She has exertional shortness of breath  when she walks uphill but the pattern is variable no edema orthopnea palpitation or syncope At times with her reflux she will get pain rating up to the jaw but predominately describing positional burning in the chest and regurgitation of food She attributes most of her symptomatology to stress in her family and business She was unaware of the  CT findings in September 2023 He has no known history of heart disease congenital rheumatic or atrial fibrillation Past Medical History:  Diagnosis Date   Allergic rhinitis 12/02/2021   Anemia    Anemia of chronic disease 04/08/2022   Anxiety    Bacterial vaginosis 05/25/2023   Bronchiectasis without complication (HCC) 04/07/2022   Cancer (HCC)    Chest pain    Complication of anesthesia    HA   Depression    Dyspnea    due to chemo   Elevated cholesterol    Family history of breast cancer 11/29/2021   Gastro-esophageal reflux disease without esophagitis 12/02/2021   Genetic testing 12/11/2021   [facility] was Negative. Report date is 12/11/2021.     The CancerNext gene panel offered by [employer] includes sequencing, rearrangement analysis, and RNA analysis for the following 36 genes:   APC, ATM, AXIN2, BARD1, BMPR1A, BRCA1, BRCA2, BRIP1, CDH1, CDK4, CDKN2A, CHEK2, DICER1, HOXB13, EPCAM, GREM1, MLH1, MSH2, MSH3, MSH6, MUTYH, NBN, NF1, NTHL1, PALB2, PMS2, POLD1, POLE, PTEN, RAD51   GERD (gastroesophageal reflux disease)    Hiatal hernia with GERD    History of therapeutic radiation 12/02/2021   Hypokalemia 04/08/2022   Invasive lobular carcinoma of right breast, stage 1 (HCC) 01/24/2022   Jaw pain    Malignant neoplasm of upper-inner quadrant of left breast in female, estrogen receptor positive (HCC) 11/26/2021   Menorrhagia 05/25/2023   Neutropenic fever (HCC) 04/07/2022   Non Hodgkin's lymphoma (HCC)    Obesity (BMI 30-39.9) 04/08/2022   Psoriasis    Pulmonary nodules 04/07/2022   Restless leg syndrome 04/08/2022   Vitamin D deficiency 05/25/2023    Past Surgical History:  Procedure Laterality Date   BREAST BIOPSY Left 11/20/2021   BREAST RECONSTRUCTION WITH PLACEMENT OF TISSUE EXPANDER AND ALLODERM Bilateral 01/07/2022   Procedure: BILATERAL BREAST RECONSTRUCTION WITH PLACEMENT OF TISSUE EXPANDERS AND ALLODERM;  Surgeon: Glenna Fellows, MD;  Location: [facility];  Service: Plastics;  Laterality: Bilateral;   COLONOSCOPY  04/07/2018   [facility]. Dr. Charna Elizabeth. Four small polyps, 2 in the sigmoid colon and 2 in teh proximal ascending colon - removed by cold biopsies. One 7 mm sessile polyps in the mid sigmoid colon, removed with a hot snare x 1; resected and retrived. The examined portion of the ileum was normal. The ecamination was otherwise normal on direct and retroflexion views.   ESOPHAGOGASTRODUODENOSCOPY  11/19/2015   [facility]. Dr. Charna Elizabeth. LA Grade C reflux esophagitis. Esophageal mucosal changes suggestive of Eosinophilic esophagitis noted in the distal 2/3 of the esophagus-biopsies done. Medium-sized hiatal hernia noted on retroplexion. Moderate diffuse gastritis. Normal examined duodenum.   ESOPHAGOGASTRODUODENOSCOPY  02/12/2023   [facility]. Dr. Jeani Hawking.  LA Grade D reflux esophagitis with no bleeding. 3 cm hiatal hernia. Gastrophageal flap valve classified as Hill Grade II ( fold present, opens with respiration). Normal stomach. Normal examined duodenum. No specimens collected.   NIPPLE SPARING MASTECTOMY Right 01/07/2022   Procedure: RIGHT NIPPLE SPARING MASTECTOMY;  Surgeon: Harriette Bouillon, MD;  Location: [facility];  Service: General;  Laterality: Right;  NIPPLE SPARING MASTECTOMY WITH SENTINEL LYMPH NODE BIOPSY Left 01/07/2022   Procedure: LEFT NIPPLE SPARING MASTECTOMY WITH SENTINEL LYMPH NODE BIOPSY;  Surgeon: Harriette Bouillon, MD;  Location: [facility];  Service: General;  Laterality: Left;   REMOVAL OF BILATERAL TISSUE EXPANDERS WITH PLACEMENT OF BILATERAL BREAST IMPLANTS Bilateral 07/15/2022   Procedure: REMOVAL OF BILATERAL TISSUE EXPANDERS WITH PLACEMENT OF BILATERAL BREAST IMPLANTS;  Surgeon: Glenna Fellows, MD;  Location: [facility];  Service: Plastics;  Laterality: Bilateral;   TUBAL LIGATION      Current  Medications: Current Meds  Medication Sig   ALPRAZolam (XANAX) 0.5 MG tablet Take 0.5 mg by mouth daily as needed for anxiety or sleep.   FLUoxetine (PROZAC) 20 MG capsule Take 1 capsule by mouth daily.   lisdexamfetamine (VYVANSE) 50 MG capsule Take 50 mg by mouth as needed (concentration).   loratadine (CLARITIN) 10 MG tablet Take 10 mg by mouth 2 (two) times daily.   metoprolol tartrate (LOPRESSOR) 100 MG tablet Take 1 tablet (100 mg total) by mouth once for 1 dose. Please take this medication 2 hours before CT   rOPINIRole (REQUIP) 0.25 MG tablet Take 0.25 mg by mouth at bedtime.   rosuvastatin (CRESTOR) 5 MG tablet Take 5 mg by mouth daily.   venlafaxine XR (EFFEXOR-XR) 37.5 MG 24 hr capsule Take 37.5 mg by mouth daily.   zolpidem (AMBIEN) 5 MG tablet Take 5 mg by mouth at bedtime.     Allergies:   Amoxicillin, Ibuprofen, Ciprofloxacin, and Codeine   Social History   Socioeconomic History   Marital status: Married    Spouse name: Not on file   Number of children: 3   Years of education: Not on file   Highest education level: Not on file  Occupational History   Not on file  Tobacco Use   Smoking status: Former    Current packs/day: 0.00    Average packs/day: 0.5 packs/day for 15.0 years (7.5 ttl pk-yrs)    Types: Cigarettes    Start date: 2001    Quit date: 2016    Years since quitting: 8.5    Passive exposure: Never   Smokeless tobacco: Never  Vaping Use   Vaping status: Never Used  Substance and Sexual Activity   Alcohol use: Yes    Comment: occas   Drug use: Never   Sexual activity: Yes    Birth control/protection: Surgical, Post-menopausal  Other Topics Concern   Not on file  Social History Narrative   Not on file   Social Determinants of Health   Financial Resource Strain: Low Risk  (11/27/2021)   Overall Financial Resource Strain (CARDIA)    Difficulty of Paying Living Expenses: Not hard at all  Food Insecurity: No Food Insecurity (11/27/2021)   Hunger  Vital Sign    Worried About Running Out of Food in the Last Year: Never true    Ran Out of Food in the Last Year: Never true  Transportation Needs: No Transportation Needs (11/27/2021)   PRAPARE - Administrator, Civil Service (Medical): No    Lack of Transportation (Non-Medical): No  Physical Activity: Not on file  Stress: Not on file  Social Connections: Not on file     Family History: The patient's family history includes Breast cancer in her mother; Cancer in her maternal uncle; Throat cancer in her father. There is no history of Lung disease, Liver disease, or Colon cancer.  ROS:   ROS Please see the  history of present illness.     All other systems reviewed and are negative.  EKGs/Labs/Other Studies Reviewed:    The following studies were reviewed today:     EKG Interpretation Date/Time:  Tuesday May 26 2023 08:42:10 EDT Ventricular Rate:  92 PR Interval:  122 QRS Duration:  74 QT Interval:  352 QTC Calculation: 435 R Axis:   39  Text Interpretation: Normal sinus rhythm Normal ECG When compared with ECG of 07-Apr-2022 12:17, PREVIOUS ECG IS PRESENT Confirmed by Norman Herrlich ([id number]) on 05/26/2023 8:47:06 AM    Recent Labs: 01/21/2023: ALT 15; BUN 19; Creatinine 0.86; Hemoglobin 13.9; Platelet Count 325; Potassium 4.1; Sodium 136  Recent Lipid Panel No results found for: "CHOL", "TRIG", "HDL", "CHOLHDL", "VLDL", "LDLCALC", "LDLDIRECT"  Physical Exam:    VS:  BP 138/80   Pulse 92   Ht 5\' 3"  (1.6 m)   Wt 180 lb 9.6 oz (81.9 kg)   SpO2 95%   BMI 31.99 kg/m     Wt Readings from Last 3 Encounters:  05/26/23 180 lb 9.6 oz (81.9 kg)  04/29/23 182 lb (82.6 kg)  04/27/23 180 lb (81.6 kg)     GEN:  Well nourished, well developed in no acute distress HEENT: Normal NECK: No JVD; No carotid bruits LYMPHATICS: No lymphadenopathy CARDIAC: RRR, no murmurs, rubs, gallops RESPIRATORY:  Clear to auscultation without rales, wheezing or rhonchi  ABDOMEN: Soft,  non-tender, non-distended MUSCULOSKELETAL:  No edema; No deformity  SKIN: Warm and dry NEUROLOGIC:  Alert and oriented x 3 PSYCHIATRIC:  Normal affect     Signed, Norman Herrlich, MD  05/26/2023 9:54 AM    [facility]

## 2023-05-26 ENCOUNTER — Telehealth: Payer: Self-pay | Admitting: Nurse Practitioner

## 2023-05-26 ENCOUNTER — Telehealth: Payer: Self-pay

## 2023-05-26 ENCOUNTER — Encounter: Payer: Self-pay | Admitting: Cardiology

## 2023-05-26 ENCOUNTER — Ambulatory Visit: Payer: BC Managed Care – PPO | Attending: Cardiology | Admitting: Cardiology

## 2023-05-26 VITALS — BP 138/80 | HR 92 | Ht 63.0 in | Wt 180.6 lb

## 2023-05-26 DIAGNOSIS — Z01810 Encounter for preprocedural cardiovascular examination: Secondary | ICD-10-CM | POA: Diagnosis not present

## 2023-05-26 DIAGNOSIS — I3481 Nonrheumatic mitral (valve) annulus calcification: Secondary | ICD-10-CM | POA: Diagnosis not present

## 2023-05-26 DIAGNOSIS — I2510 Atherosclerotic heart disease of native coronary artery without angina pectoris: Secondary | ICD-10-CM | POA: Diagnosis not present

## 2023-05-26 DIAGNOSIS — K5900 Constipation, unspecified: Secondary | ICD-10-CM | POA: Insufficient documentation

## 2023-05-26 DIAGNOSIS — Z923 Personal history of irradiation: Secondary | ICD-10-CM

## 2023-05-26 DIAGNOSIS — Z860100 Personal history of colon polyps, unspecified: Secondary | ICD-10-CM | POA: Insufficient documentation

## 2023-05-26 DIAGNOSIS — K920 Hematemesis: Secondary | ICD-10-CM | POA: Insufficient documentation

## 2023-05-26 DIAGNOSIS — E7800 Pure hypercholesterolemia, unspecified: Secondary | ICD-10-CM

## 2023-05-26 DIAGNOSIS — R1310 Dysphagia, unspecified: Secondary | ICD-10-CM | POA: Insufficient documentation

## 2023-05-26 DIAGNOSIS — R072 Precordial pain: Secondary | ICD-10-CM

## 2023-05-26 DIAGNOSIS — Z9221 Personal history of antineoplastic chemotherapy: Secondary | ICD-10-CM

## 2023-05-26 DIAGNOSIS — K5904 Chronic idiopathic constipation: Secondary | ICD-10-CM | POA: Insufficient documentation

## 2023-05-26 DIAGNOSIS — K2100 Gastro-esophageal reflux disease with esophagitis, without bleeding: Secondary | ICD-10-CM | POA: Insufficient documentation

## 2023-05-26 DIAGNOSIS — R1111 Vomiting without nausea: Secondary | ICD-10-CM | POA: Insufficient documentation

## 2023-05-26 DIAGNOSIS — R140 Abdominal distension (gaseous): Secondary | ICD-10-CM | POA: Insufficient documentation

## 2023-05-26 DIAGNOSIS — I700 Atherosclerosis of aorta: Secondary | ICD-10-CM

## 2023-05-26 MED ORDER — METOPROLOL TARTRATE 100 MG PO TABS: 100.0000 mg | ORAL_TABLET | Freq: Once | ORAL | 0 refills | Status: DC

## 2023-05-26 NOTE — Patient Instructions (Addendum)
Medication Instructions:  Your physician recommends that you continue on your current medications as directed. Please refer to the Current Medication list given to you today.  *If you need a refill on your cardiac medications before your next appointment, please call your pharmacy*   Lab Work: Your physician recommends that you return for lab work in:   Labs 1 week before CT: BMP  If you have labs (blood work) drawn today and your tests are completely normal, you will receive your results only by: MyChart Message (if you have MyChart) OR A paper copy in the mail If you have any lab test that is abnormal or we need to change your treatment, we will call you to review the results.   Testing/Procedures: Your physician has requested that you have an echocardiogram. Echocardiography is a painless test that uses sound waves to create images of your heart. It provides your doctor with information about the size and shape of your heart and how well your heart's chambers and valves are working. This procedure takes approximately one hour. There are no restrictions for this procedure. Please do NOT wear cologne, perfume, aftershave, or lotions (deodorant is allowed). Please arrive 15 minutes prior to your appointment time.      Your cardiac CT will be scheduled at one of the below locations:   [facility] [address] [place], [place] [address] [phone]  OR  [facility] [address] [place], [place] [address] [phone]  OR   [facility] [address] [place], [place] [address] [phone]  If scheduled at [facility], please arrive at the [facility]'s Entrance (Entrance C2) of [facility] 30 minutes prior to test start time. You can use the FREE valet parking offered at entrance C (encouraged to control the heart rate for the test)  Proceed to the [facility] Radiology Department (first floor) to check-in and test prep.  All radiology patients and guests should use entrance C2 at [facility], accessed from [facility] even though the hospital's physical address listed is [address].    If scheduled at [facility] or [facility], please arrive 15 mins early for check-in and test prep. There is spacious parking and easy access to the radiology department from the [facility] entrance. Please enter here and check-in with the desk attendant.    Please follow these instructions carefully (unless otherwise directed):  An IV will be required for this test and Nitroglycerin will be given.   On the Night Before the Test: Be sure to Drink plenty of water. Do not consume any caffeinated/decaffeinated beverages or chocolate 12 hours prior to your test. Do not take any antihistamines 12 hours prior to your test.  On the Day of the Test: Drink plenty of water until 1 hour prior to the test. Do not eat any food 1 hour prior to test. You may take your regular medications prior to the test.  Take metoprolol (Lopressor) two hours prior to test. FEMALES- please wear underwire-free bra if available, avoid dresses & tight clothing  After the Test: Drink plenty of water. After receiving IV contrast, you may experience a mild flushed feeling. This is normal. On occasion, you may experience a mild rash up to 24 hours after the test. This is not dangerous. If this occurs, you can take Benadryl 25 mg and increase your  fluid intake. If you experience trouble breathing, this can be serious. If it is severe call 911 IMMEDIATELY. If it is mild, please call our office.  We will call to schedule your test 2-4 weeks out understanding that some insurance companies will need an authorization prior to the service being performed.   For more information and frequently asked  questions, please visit our website : [email or web address]  For non-scheduling related questions, please contact the cardiac imaging nurse navigator should you have any questions/concerns: Rockwell Alexandria, Cardiac Imaging Nurse Navigator Larey Brick, Cardiac Imaging Nurse Navigator [facility] Direct Office Dial: [phone]   For scheduling needs, including cancellations and rescheduling, please call [place], [phone].    Follow-Up: At [facility], you and your health needs are our priority.  As part of our continuing mission to provide you with exceptional heart care, we have created designated Provider Care Teams.  These Care Teams include your primary Cardiologist (physician) and Advanced Practice Providers (APPs -  Physician Assistants and Nurse Practitioners) who all work together to provide you with the care you need, when you need it.  We recommend signing up for the patient portal called "MyChart".  Sign up information is provided on this After Visit Summary.  MyChart is used to connect with patients for Virtual Visits (Telemedicine).  Patients are able to view lab/test results, encounter notes, upcoming appointments, etc.  Non-urgent messages can be sent to your provider as well.   To learn more about what you can do with MyChart, go to [email or web address].    Your next appointment:   3 month(s)  Provider:   Norman Herrlich, MD    Other Instructions None

## 2023-05-26 NOTE — Telephone Encounter (Signed)
Patient is aware of rescheduled appointment times/date

## 2023-05-26 NOTE — Telephone Encounter (Signed)
   Pre-operative Risk Assessment    Patient Name: Erika Hodge  DOB: [birth year] MRN: [id number]      Request for Surgical Clearance    Procedure:   Laparoscopic hiatal hernia surgery   Date of Surgery:  Clearance TBD                                 Surgeon:  Gaynelle Adu, MD  Surgeon's Group or Practice Name:  [facility]  Phone number:  [phone] Fax number:  [phone]   Type of Clearance Requested:   - Medical    Type of Anesthesia:  General    Additional requests/questions:    SignedBelva Bertin   05/26/2023, 3:50 PM

## 2023-05-27 ENCOUNTER — Encounter (HOSPITAL_COMMUNITY): Payer: Self-pay

## 2023-05-28 ENCOUNTER — Ambulatory Visit: Payer: BC Managed Care – PPO | Admitting: Nurse Practitioner

## 2023-05-28 ENCOUNTER — Other Ambulatory Visit: Payer: BC Managed Care – PPO

## 2023-06-01 ENCOUNTER — Telehealth: Payer: Self-pay

## 2023-06-01 NOTE — Telephone Encounter (Signed)
-----   Message from Shellia Cleverly sent at 06/01/2023  4:29 PM EDT ----- Regarding: FW: tif/hhr May need to get her a f/u appt with me before booking to the OR. Please schedule for sometime after she had completed cardiology eval. Thanks. ----- Message ----- From: Gaynelle Adu, MD Sent: 05/22/2023  12:30 PM EDT To: Shellia Cleverly, DO Subject: tif/hhr                                        Saw her today Seemed a little anxious/overwhelmed fyi-   You may just want to touch base with her to see if she has addl questions Interested in proceeding  She did c/o chest pain with rad to Jaw so I'm sending to cards for clearance.  Thanks w

## 2023-06-02 ENCOUNTER — Ambulatory Visit (HOSPITAL_COMMUNITY)
Admission: RE | Admit: 2023-06-02 | Discharge: 2023-06-02 | Disposition: A | Discharge status: Home / Self Care | Payer: BC Managed Care – PPO | Source: Ambulatory Visit | Attending: Cardiology | Admitting: Cardiology

## 2023-06-02 DIAGNOSIS — R072 Precordial pain: Secondary | ICD-10-CM | POA: Diagnosis not present

## 2023-06-02 MED ORDER — IOHEXOL 350 MG/ML SOLN
95.0000 mL | Freq: Once | INTRAVENOUS | Status: AC | PRN
  Administered 2023-06-02: 95 mL via INTRAVENOUS

## 2023-06-02 MED ORDER — NITROGLYCERIN 0.4 MG SL SUBL
SUBLINGUAL_TABLET | SUBLINGUAL | Status: AC
  Filled 2023-06-02: qty 2

## 2023-06-02 MED ORDER — NITROGLYCERIN 0.4 MG SL SUBL
0.8000 mg | SUBLINGUAL_TABLET | Freq: Once | SUBLINGUAL | Status: AC
  Administered 2023-06-02: 0.8 mg via SUBLINGUAL

## 2023-06-02 NOTE — Progress Notes (Addendum)
Pt arrived to IR for cardiac CT. PIV placement required for scan with contrast. Pt stated having double mastectomy with lymph nodes removed only in left side. Called cardiologist reader Cristal Deer MD who stated use of R AC is appropriate for the scan. Verbal readback by this RN confirmed by MD. IV team called for ultra-sound guided IV. Pt updated and verbalized understanding.

## 2023-06-02 NOTE — Telephone Encounter (Signed)
Called and left patient a detailed message. I advised Erika Hodge to call back to schedule an office visit with Dr. Barron Alvine after her cardiology evaluation. Looks like cardiology 3 month follow up is in mid-October. Appt will need to be after this.

## 2023-06-03 NOTE — Progress Notes (Unsigned)
Patient Care Team: Hague, Myrene Galas, MD as PCP - General (Internal Medicine) Harriette Bouillon, MD as Consulting Physician (General Surgery) Malachy Mood, MD as Consulting Physician (Hematology) Antony Blackbird, MD as Consulting Physician (Radiation Oncology) Shellia Cleverly, DO as Consulting Physician (Gastroenterology)   CHIEF COMPLAINT: Follow up bilateral breast cancer   Oncology History Overview Note   Cancer Staging  Invasive lobular carcinoma of right breast, stage 1 (HCC) Staging form: Breast, AJCC 8th Edition - Clinical stage from 01/07/2022: Stage Unknown (cT1c, cNX, cM0, G1, ER+, PR+, HER2-) - Signed by Malachy Mood, MD on 01/24/2022  Malignant neoplasm of upper-inner quadrant of left breast in female, estrogen receptor positive (HCC) Staging form: Breast, AJCC 8th Edition - Clinical stage from 11/20/2021: Stage IA (cT1c, cN0, cM0, G2, ER+, PR-, HER2-) - Signed by Malachy Mood, MD on 11/26/2021 - Pathologic stage from 01/07/2022: Stage IIA (pT2, pN0, cM0, G2, ER-, PR-, HER2-) - Signed by Malachy Mood, MD on 01/24/2022     Malignant neoplasm of upper-inner quadrant of left breast in female, estrogen receptor positive (HCC)  11/05/2021 Mammogram   CLINICAL DATA:  Screening recall for a possible left breast asymmetry.   EXAM: DIGITAL DIAGNOSTIC UNILATERAL LEFT MAMMOGRAM WITH TOMOSYNTHESIS AND CAD; ULTRASOUND LEFT BREAST LIMITED  IMPRESSION: 1. Irregular 1.2 cm mass in the left breast at 11 o'clock, 2 cm the nipple, suspicious for malignancy.   11/20/2021 Cancer Staging   Staging form: Breast, AJCC 8th Edition - Clinical stage from 11/20/2021: Stage IA (cT1c, cN0, cM0, G2, ER+, PR-, HER2-) - Signed by Malachy Mood, MD on 11/26/2021 Stage prefix: Initial diagnosis Histologic grading system: 3 grade system   11/20/2021 Initial Biopsy   Diagnosis Breast, left, needle core biopsy, 11 o'clock, ribbon clip - INVASIVE DUCTAL CARCINOMA - DUCTAL CARCINOMA IN SITU - SEE COMMENT Microscopic  Comment based on the biopsy, the carcinoma appears Nottingham grade 2 of 3 and measures 0.6 cm in greatest linear extent.  PROGNOSTIC INDICATORS Results: The tumor cells are NEGATIVE for Her2 (1+). Estrogen Receptor: 10%, POSITIVE, WEAK STAINING INTENSITY Progesterone Receptor: 0%, NEGATIVE Proliferation Marker Ki67: 20%   11/26/2021 Initial Diagnosis   Malignant neoplasm of upper-inner quadrant of left breast in female, estrogen receptor positive (HCC)   11/27/2021 Genetic Testing   [employer] was Negative. Report date is 12/11/2021.  The CancerNext gene panel offered by [employer] includes sequencing, rearrangement analysis, and RNA analysis for the following 36 genes:   APC, ATM, AXIN2, BARD1, BMPR1A, BRCA1, BRCA2, BRIP1, CDH1, CDK4, CDKN2A, CHEK2, DICER1, HOXB13, EPCAM, GREM1, MLH1, MSH2, MSH3, MSH6, MUTYH, NBN, NF1, NTHL1, PALB2, PMS2, POLD1, POLE, PTEN, RAD51C, RAD51D, RECQL, SMAD4, SMARCA4, STK11, and TP53.    01/07/2022 Cancer Staging   Staging form: Breast, AJCC 8th Edition - Pathologic stage from 01/07/2022: Stage IIA (pT2, pN0, cM0, G2, ER-, PR-, HER2-) - Signed by Malachy Mood, MD on 01/24/2022 Stage prefix: Initial diagnosis Histologic grading system: 3 grade system Residual tumor (R): R0 - None   01/07/2022 Definitive Surgery   FINAL MICROSCOPIC DIAGNOSIS:   A. BREAST, RIGHT NIPPLE, BIOPSY:  - Benign breast tissue.  - No malignancy identified.   B. BREAST, LEFT NIPPLE, BIOPSY:  - Benign breast tissue.  - No malignancy identified.   C. BREAST, RIGHT, MASTECTOMY:  - Invasive lobular carcinoma, 1.1 cm.  - Ductal carcinoma in situ, 1 cm.  - Invasive carcinoma focally extends to the anterior margin.  - Fibrocystic changes with usual ductal hyperplasia and calcifications.  - Sclerosing adenosis  and fibroadenomatoid change.  - See oncology table and comment.   D. LYMPH NODE, LEFT AXILLARY, SENTINEL, EXCISION:  - One lymph node negative for metastatic carcinoma  (0/1).   E. LYMPH NODE, LEFT AXILLARY, SENTINEL, EXCISION:  - One lymph node negative for metastatic carcinoma (0/1).   F. BREAST, LEFT, MASTECTOMY:  - Invasive and in situ ductal carcinoma, 2.1 cm.  - Margins negative for carcinoma.  - See oncology table.    01/07/2022 Receptors her2   F1. PROGNOSTIC INDICATOR RESULTS:   The tumor cells are NEGATIVE for Her2 (0).   Estrogen Receptor: NEGATIVE  Progesterone Receptor: NEGATIVE  Proliferation Marker Ki-67: 10%    02/12/2022 - 06/06/2022 Chemotherapy   Patient is on Treatment Plan : BREAST TC q21d     08/2022 -  Anti-estrogen oral therapy   Tamoxifen   Invasive lobular carcinoma of right breast, stage 1 (HCC)  11/27/2021 Genetic Testing   [employer] was Negative. Report date is 12/11/2021.  The CancerNext gene panel offered by [employer] includes sequencing, rearrangement analysis, and RNA analysis for the following 36 genes:   APC, ATM, AXIN2, BARD1, BMPR1A, BRCA1, BRCA2, BRIP1, CDH1, CDK4, CDKN2A, CHEK2, DICER1, HOXB13, EPCAM, GREM1, MLH1, MSH2, MSH3, MSH6, MUTYH, NBN, NF1, NTHL1, PALB2, PMS2, POLD1, POLE, PTEN, RAD51C, RAD51D, RECQL, SMAD4, SMARCA4, STK11, and TP53.    01/07/2022 Cancer Staging   Staging form: Breast, AJCC 8th Edition - Clinical stage from 01/07/2022: Stage Unknown (cT1c, cNX, cM0, G1, ER+, PR+, HER2-) - Signed by Malachy Mood, MD on 01/24/2022 Stage prefix: Initial diagnosis Histologic grading system: 3 grade system   01/07/2022 Definitive Surgery   FINAL MICROSCOPIC DIAGNOSIS:   A. BREAST, RIGHT NIPPLE, BIOPSY:  - Benign breast tissue.  - No malignancy identified.   B. BREAST, LEFT NIPPLE, BIOPSY:  - Benign breast tissue.  - No malignancy identified.   C. BREAST, RIGHT, MASTECTOMY:  - Invasive lobular carcinoma, 1.1 cm.  - Ductal carcinoma in situ, 1 cm.  - Invasive carcinoma focally extends to the anterior margin.  - Fibrocystic changes with usual ductal hyperplasia and calcifications.  -  Sclerosing adenosis and fibroadenomatoid change.  - See oncology table and comment.   D. LYMPH NODE, LEFT AXILLARY, SENTINEL, EXCISION:  - One lymph node negative for metastatic carcinoma (0/1).   E. LYMPH NODE, LEFT AXILLARY, SENTINEL, EXCISION:  - One lymph node negative for metastatic carcinoma (0/1).   F. BREAST, LEFT, MASTECTOMY:  - Invasive and in situ ductal carcinoma, 2.1 cm.  - Margins negative for carcinoma.  - See oncology table.    01/07/2022 Receptors her2   C7.  PROGNOSTIC INDICATOR RESULTS:   The tumor cells are NEGATIVE for Her2 (1+).   Estrogen Receptor: POSITIVE, 30% WEAK STAINING INTENSITY  Progesterone Receptor: POSITIVE, 80% MODERATE STAINING INTENSITY  Proliferation Marker Ki-67: <5%    01/24/2022 Initial Diagnosis   Invasive lobular carcinoma of right breast, stage 1 (HCC)   08/2022 -  Anti-estrogen oral therapy   Tamoxifen      CURRENT THERAPY:  - Anti-estrogen therapy with Tamoxifen, starting 08/2022 , and stop due to side effects 02/2023 -Exemestane to start 04/2023, stopped after 2 weeks due to joint pain (hands) and headaches   INTERVAL HISTORY Erika Hodge returns with her spouse for follow up as scheduled. Last seen by Dr. Mosetta Putt virtually 03/17/23. She began exemestane in June, but stopped after 2 weeks due to joint pain in her hands/trigger finger and headaches.  Symptoms have mostly resolved since stopping exemestane,  but still has headaches which she has had before.  She was treating these with Marlin Canary powder twice daily but stopped about 2 months ago and switch to Tylenol when she started having GI concerns.  Planning hernia surgery but the date is not set yet. She takes Tylenol 500 mg twice daily most days.  She also has hot flashes which persist after stopping exemestane, having one now.  Effexor has not helped.  ROS  All other systems reviewed and negative  Past Medical History:  Diagnosis Date   Allergic rhinitis 12/02/2021   Anemia    Anemia  of chronic disease 04/08/2022   Anxiety    Bacterial vaginosis 05/25/2023   Bronchiectasis without complication (HCC) 04/07/2022   Cancer (HCC)    Chest pain    Complication of anesthesia    HA   Depression    Dyspnea    due to chemo   Elevated cholesterol    Family history of breast cancer 11/29/2021   Gastro-esophageal reflux disease without esophagitis 12/02/2021   Genetic testing 12/11/2021   [employer] [facility] was Negative. Report date is 12/11/2021.     The CancerNext gene panel offered by [employer] includes sequencing, rearrangement analysis, and RNA analysis for the following 36 genes:   APC, ATM, AXIN2, BARD1, BMPR1A, BRCA1, BRCA2, BRIP1, CDH1, CDK4, CDKN2A, CHEK2, DICER1, HOXB13, EPCAM, GREM1, MLH1, MSH2, MSH3, MSH6, MUTYH, NBN, NF1, NTHL1, PALB2, PMS2, POLD1, POLE, PTEN, RAD51   GERD (gastroesophageal reflux disease)    Hiatal hernia with GERD    History of therapeutic radiation 12/02/2021   Hypokalemia 04/08/2022   Invasive lobular carcinoma of right breast, stage 1 (HCC) 01/24/2022   Jaw pain    Malignant neoplasm of upper-inner quadrant of left breast in female, estrogen receptor positive (HCC) 11/26/2021   Menorrhagia 05/25/2023   Neutropenic fever (HCC) 04/07/2022   Non Hodgkin's lymphoma (HCC)    Obesity (BMI 30-39.9) 04/08/2022   Psoriasis    Pulmonary nodules 04/07/2022   Restless leg syndrome 04/08/2022   Vitamin D deficiency 05/25/2023     Past Surgical History:  Procedure Laterality Date   BREAST BIOPSY Left 11/20/2021   BREAST RECONSTRUCTION WITH PLACEMENT OF TISSUE EXPANDER AND ALLODERM Bilateral 01/07/2022   Procedure: BILATERAL BREAST RECONSTRUCTION WITH PLACEMENT OF TISSUE EXPANDERS AND ALLODERM;  Surgeon: Glenna Fellows, MD;  Location: [facility];  Service: Plastics;  Laterality: Bilateral;   COLONOSCOPY  04/07/2018   [facility]. Dr. Charna Elizabeth. Four small polyps, 2 in the sigmoid colon and 2 in teh proximal  ascending colon - removed by cold biopsies. One 7 mm sessile polyps in the mid sigmoid colon, removed with a hot snare x 1; resected and retrived. The examined portion of the ileum was normal. The ecamination was otherwise normal on direct and retroflexion views.   ESOPHAGOGASTRODUODENOSCOPY  11/19/2015   [facility]. Dr. Charna Elizabeth. LA Grade C reflux esophagitis. Esophageal mucosal changes suggestive of Eosinophilic esophagitis noted in the distal 2/3 of the esophagus-biopsies done. Medium-sized hiatal hernia noted on retroplexion. Moderate diffuse gastritis. Normal examined duodenum.   ESOPHAGOGASTRODUODENOSCOPY  02/12/2023   [facility]. Dr. Jeani Hawking.  LA Grade D reflux esophagitis with no bleeding. 3 cm hiatal hernia. Gastrophageal flap valve classified as Hill Grade II ( fold present, opens with respiration). Normal stomach. Normal examined duodenum. No specimens collected.   NIPPLE SPARING MASTECTOMY Right 01/07/2022   Procedure: RIGHT NIPPLE SPARING MASTECTOMY;  Surgeon: Harriette Bouillon, MD;  Location: [facility];  Service: General;  Laterality: Right;   NIPPLE SPARING MASTECTOMY WITH SENTINEL LYMPH NODE BIOPSY Left 01/07/2022   Procedure: LEFT NIPPLE SPARING MASTECTOMY WITH SENTINEL LYMPH NODE BIOPSY;  Surgeon: Harriette Bouillon, MD;  Location: [facility];  Service: General;  Laterality: Left;   REMOVAL OF BILATERAL TISSUE EXPANDERS WITH PLACEMENT OF BILATERAL BREAST IMPLANTS Bilateral 07/15/2022   Procedure: REMOVAL OF BILATERAL TISSUE EXPANDERS WITH PLACEMENT OF BILATERAL BREAST IMPLANTS;  Surgeon: Glenna Fellows, MD;  Location: [facility];  Service: Plastics;  Laterality: Bilateral;   TUBAL LIGATION       Outpatient Encounter Medications as of 06/04/2023  Medication Sig   ALPRAZolam (XANAX) 0.5 MG tablet Take 0.5 mg by mouth daily as needed for anxiety or sleep.   anastrozole (ARIMIDEX) 1 MG tablet Take 1 tablet  (1 mg total) by mouth daily.   Fezolinetant (VEOZAH) 45 MG TABS Take 1 tablet (45 mg total) by mouth daily.   FLUoxetine (PROZAC) 20 MG capsule Take 1 capsule by mouth daily.   lisdexamfetamine (VYVANSE) 50 MG capsule Take 50 mg by mouth as needed (concentration).   loratadine (CLARITIN) 10 MG tablet Take 10 mg by mouth 2 (two) times daily.   rOPINIRole (REQUIP) 0.25 MG tablet Take 0.25 mg by mouth at bedtime.   rosuvastatin (CRESTOR) 5 MG tablet Take 5 mg by mouth daily.   venlafaxine XR (EFFEXOR-XR) 37.5 MG 24 hr capsule Take 37.5 mg by mouth daily.   zolpidem (AMBIEN) 5 MG tablet Take 5 mg by mouth at bedtime.   [DISCONTINUED] metoprolol tartrate (LOPRESSOR) 100 MG tablet Take 1 tablet (100 mg total) by mouth once for 1 dose. Please take this medication 2 hours before CT   [DISCONTINUED] prochlorperazine (COMPAZINE) 10 MG tablet Take 1 tablet (10 mg total) by mouth every 6 (six) hours as needed (Nausea or vomiting).   No facility-administered encounter medications on file as of 06/04/2023.     Today's Vitals   06/04/23 0905 06/04/23 0906  BP: 134/76   Pulse: 98   Resp: 17   Temp: 98 F (36.7 C)   TempSrc: Oral   SpO2: 98%   Weight: 181 lb 11.2 oz (82.4 kg)   PainSc:  0-No pain   Body mass index is 32.19 kg/m.   PHYSICAL EXAM GENERAL:alert, no distress and comfortable SKIN: no rash  EYES: sclera clear NECK: without mass LYMPH:  no palpable cervical or supraclavicular lymphadenopathy  LUNGS: clear with normal breathing effort HEART: regular rate & rhythm, no lower extremity edema ABDOMEN: abdomen soft, non-tender and normal bowel sounds NEURO: alert & oriented x 3 with fluent speech, no focal motor/sensory deficits Breast exam: No nipple discharge or inversion.  S/p bilateral mastectomy and reconstruction, incisions completely healed.  Mild raised erythematous rash to the chest wall.  No palpable mass or nodularity in either breast or axilla that I could appreciate.   CBC     Component Value Date/Time   WBC 6.7 06/04/2023 0832   WBC 4.3 04/09/2022 0349   RBC 4.46 06/04/2023 0832   HGB 13.3 06/04/2023 0832   HCT 41.9 06/04/2023 0832   PLT 346 06/04/2023 0832   MCV 93.9 06/04/2023 0832   MCH 29.8 06/04/2023 0832   MCHC 31.7 06/04/2023 0832   RDW 13.2 06/04/2023 0832   LYMPHSABS 1.4 06/04/2023 0832   MONOABS 0.9 06/04/2023 0832   EOSABS 0.3 06/04/2023 0832   BASOSABS 0.1 06/04/2023 0832     CMP     Component  Value Date/Time   NA 137 06/04/2023 0832   K 4.4 06/04/2023 0832   CL 101 06/04/2023 0832   CO2 29 06/04/2023 0832   GLUCOSE 98 06/04/2023 0832   BUN 20 06/04/2023 0832   CREATININE 0.96 06/04/2023 0832   CALCIUM 10.1 06/04/2023 0832   PROT 7.4 06/04/2023 0832   ALBUMIN 4.5 06/04/2023 0832   AST 51 (H) 06/04/2023 0832   ALT 66 (H) 06/04/2023 0832   ALKPHOS 80 06/04/2023 0832   BILITOT 0.3 06/04/2023 0832   GFRNONAA >60 06/04/2023 0832     ASSESSMENT & PLAN:Jenesis V Roselli is a 57 y.o. 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 -she opted to proceed with b/l mastectomies with reconstruction on 01/07/22 with Dr. Luisa Hart and Dr. Leta Baptist. Pathology revealed b/l breast cancers. Left breast showed 2.1 cm invasive and in situ ductal carcinoma. Margins and lymph nodes negative (0/2). Repeated ER/PR/HER2 were all negative (ER 10% weak (+) on biopsy) -she developed left IMF necrotic tissue, excised by Dr. Leta Baptist on 01/27/22. -given prior treatment with adriamycin, she completed 4 cycles TC 02/12/22 - 06/06/22, tolerated fairly  -we reviewed that adjuvant radiation is not recommended due to her previous radiation for lymphoma.    Invasive lobular carcinoma of right breast, stage 1; stage I pT1c, ER weakly+/PR+/Her2-, and DCIS  -incidental finding on  b/l mastectomies on 01/07/22 for left breast cancer. Pathology from right breast showed: 1.1 cm invasive lobular carcinoma, grade 1, in LIQ; 1 cm DCIS, intermediate grade, in UIQ. ILC showed ER 30% weakly positive, PR 80% moderately positive, Her2 negative. ILC focally involves adjacent anterior margin.  -She has tried low-dose and full dose tamoxifen which she initially started 08/2022 after she weaned off Prozac.   -Unfortunately she did not tolerate Tamoxifen even with increased effexor, with joint pain, hot flash, brain fog, and irritability.  She stopped in 02/2023 and went back on prozac -Due to post menopausal status, she began Exemestane in 04/2023, stopped after 2 weeks due to joint pain (hands) and headaches. Pain stopped when she dc'd, but still has headaches  -We discussed other anti-estrogen and the potential benefit. She would like to try one more time. I recommend anastrozole or letrozole -Will try Anastrozole, ok to take 1/2 tab for first month -F/up in 1 month for tox check  Headaches -chronic, worsened with Exemestane -Previously took [employer] but stopped 2 months ago when she developed reflux symptoms -Switched to tylenol, takes 500 mg BID most days -I reviewed possible medication oversue/rebound headache phenomenon and encouraged her to cut back tylenol -AST/ALT mildly elevated. Will monitor -I offered referral to neuro onc, she declined for now  Hot flashes -Secondary to antiestrogen therapy, Effexor was not effective even at high dose. -Hesitant to prescribe gabapentin given her other CNS medications -I recommend to try Veozah, LFTs slightly elevated today, possibly due to Tylenol and/or statin; she does not drink much alcohol.  Will monitor closely  Dermatitis  -She has mild rash to chest wall and under breasts, exam otherwise benign -Does not appear fungal  -Takes Benadryl PRN. I recommend to add topical hydrocortisone    PLAN: -Begin Anastrozole, ok to take 1/2  tab for first month -Rx: Anastrozole 1 mg po once daily and Veozah 45 mg po once daily for vasomotor symptoms -  Reviewed symptom management for headaches, hot flashes, rash -Close f/up with lab/office visit in 1 month for LFTs and AI tox check     All questions were answered. The patient knows to call the clinic with any problems, questions or concerns. No barriers to learning were detected. I spent 20 minutes counseling the patient face to face. The total time spent in the appointment was 30 minutes and more than 50% was on counseling, review of test results, and coordination of care.   Santiago Glad, NP-C 06/04/2023

## 2023-06-04 ENCOUNTER — Inpatient Hospital Stay: Payer: BC Managed Care – PPO | Admitting: Nurse Practitioner

## 2023-06-04 ENCOUNTER — Other Ambulatory Visit: Payer: Self-pay

## 2023-06-04 ENCOUNTER — Encounter: Payer: Self-pay | Admitting: Nurse Practitioner

## 2023-06-04 ENCOUNTER — Inpatient Hospital Stay: Payer: BC Managed Care – PPO | Attending: Hematology

## 2023-06-04 VITALS — BP 134/76 | HR 98 | Temp 98.0°F | Resp 17 | Wt 181.7 lb

## 2023-06-04 DIAGNOSIS — C50212 Malignant neoplasm of upper-inner quadrant of left female breast: Secondary | ICD-10-CM

## 2023-06-04 DIAGNOSIS — Z170 Estrogen receptor positive status [ER+]: Secondary | ICD-10-CM | POA: Insufficient documentation

## 2023-06-04 DIAGNOSIS — C50911 Malignant neoplasm of unspecified site of right female breast: Secondary | ICD-10-CM | POA: Diagnosis not present

## 2023-06-04 DIAGNOSIS — R232 Flushing: Secondary | ICD-10-CM

## 2023-06-04 DIAGNOSIS — C50919 Malignant neoplasm of unspecified site of unspecified female breast: Secondary | ICD-10-CM

## 2023-06-04 DIAGNOSIS — D649 Anemia, unspecified: Secondary | ICD-10-CM

## 2023-06-04 DIAGNOSIS — Z79811 Long term (current) use of aromatase inhibitors: Secondary | ICD-10-CM | POA: Diagnosis not present

## 2023-06-04 DIAGNOSIS — T451X5A Adverse effect of antineoplastic and immunosuppressive drugs, initial encounter: Secondary | ICD-10-CM

## 2023-06-04 LAB — CMP (CANCER CENTER ONLY)
ALT: 66 U/L — ABNORMAL HIGH (ref 0–44)
AST: 51 U/L — ABNORMAL HIGH (ref 15–41)
Albumin: 4.5 g/dL (ref 3.5–5.0)
Alkaline Phosphatase: 80 U/L (ref 38–126)
Anion gap: 7 (ref 5–15)
BUN: 20 mg/dL (ref 6–20)
CO2: 29 mmol/L (ref 22–32)
Calcium: 10.1 mg/dL (ref 8.9–10.3)
Chloride: 101 mmol/L (ref 98–111)
Creatinine: 0.96 mg/dL (ref 0.44–1.00)
GFR, Estimated: >60 mL/min (ref >60)
Glucose, Bld: 98 mg/dL (ref 70–99)
Potassium: 4.4 mmol/L (ref 3.5–5.1)
Sodium: 137 mmol/L (ref 135–145)
Total Bilirubin: 0.3 mg/dL (ref 0.3–1.2)
Total Protein: 7.4 g/dL (ref 6.5–8.1)

## 2023-06-04 LAB — CBC WITH DIFFERENTIAL (CANCER CENTER ONLY)
Abs Immature Granulocytes: 0.02 10*3/uL (ref 0.00–0.07)
Basophils Absolute: 0.1 10*3/uL (ref 0.0–0.1)
Basophils Relative: 1 %
Eosinophils Absolute: 0.3 10*3/uL (ref 0.0–0.5)
Eosinophils Relative: 4 %
HCT: 41.9 % (ref 36.0–46.0)
Hemoglobin: 13.3 g/dL (ref 12.0–15.0)
Immature Granulocytes: 0 %
Lymphocytes Relative: 21 %
Lymphs Abs: 1.4 10*3/uL (ref 0.7–4.0)
MCH: 29.8 pg (ref 26.0–34.0)
MCHC: 31.7 g/dL (ref 30.0–36.0)
MCV: 93.9 fL (ref 80.0–100.0)
Monocytes Absolute: 0.9 10*3/uL (ref 0.1–1.0)
Monocytes Relative: 14 %
Neutro Abs: 4 10*3/uL (ref 1.7–7.7)
Neutrophils Relative %: 60 %
Platelet Count: 346 10*3/uL (ref 150–400)
RBC: 4.46 MIL/uL (ref 3.87–5.11)
RDW: 13.2 % (ref 11.5–15.5)
WBC Count: 6.7 10*3/uL (ref 4.0–10.5)
nRBC: 0 % (ref 0.0–0.2)

## 2023-06-04 LAB — IRON AND IRON BINDING CAPACITY (CC-WL,HP ONLY)
Iron: 70 ug/dL (ref 28–170)
Saturation Ratios: 16 % (ref 10.4–31.8)
TIBC: 440 ug/dL (ref 250–450)
UIBC: 370 ug/dL (ref 148–442)

## 2023-06-04 LAB — FERRITIN: Ferritin: 15 ng/mL (ref 11–307)

## 2023-06-04 MED ORDER — VEOZAH 45 MG PO TABS
45.0000 mg | ORAL_TABLET | Freq: Every day | ORAL | 3 refills | Status: DC
Start: 2023-06-04 — End: 2023-07-13

## 2023-06-04 MED ORDER — ANASTROZOLE 1 MG PO TABS: 1.0000 mg | ORAL_TABLET | Freq: Every day | ORAL | 3 refills | Status: DC

## 2023-06-05 ENCOUNTER — Telehealth: Payer: Self-pay

## 2023-06-05 NOTE — Telephone Encounter (Signed)
Patient called and stated she was advised to make an appointment. Patient I advised her that it was recommended that she see Dr. Barron Alvine after her appointment with Dr. Dulce Sellar in October. Patient stated she was unsure why she needed to be seen again. Patient was confused. Patient was scheduled for 10/31 at 9:40 and is requesting a call to discuss. Please advsie.

## 2023-06-05 NOTE — Telephone Encounter (Signed)
Notified Patient of prior authorization rejection for Veozah. Awaiting rejection letter by fax to file an appeal. Patient verbalized understanding. No other needs or concerns voiced at this time.

## 2023-06-05 NOTE — Telephone Encounter (Signed)
Returned call to patient. I reviewed original note from Dr. Andrey Campanile. Pt was under the impression that her cardiac evaluation has been completed since she had EKG and CT coronary. I advised pt that she is scheduled for an echocardiogram on 06/18/23. Pt will need to complete that exam and follow up with cardiology and then we would need to see her in the office for an updated assessment before getting her scheduled for cTIF (hiatal hernia repair and anti-reflux surgery). Pt verbalized understanding and had no concerns at the end of the call.

## 2023-06-08 ENCOUNTER — Other Ambulatory Visit: Payer: Self-pay | Admitting: Hematology

## 2023-06-08 NOTE — Telephone Encounter (Signed)
Pt switched to Anastrozole & Vezah per appt w/Lacie Azucena Cecil, NP on 06/04/2023

## 2023-06-18 ENCOUNTER — Ambulatory Visit: Payer: BC Managed Care – PPO

## 2023-06-18 ENCOUNTER — Encounter: Payer: Self-pay | Admitting: Nurse Practitioner

## 2023-06-18 ENCOUNTER — Telehealth: Payer: Self-pay | Admitting: Cardiology

## 2023-06-18 NOTE — Telephone Encounter (Signed)
Patient had to r/s their Echo because the patient's husband test postive for covid. The patient is r/s for 08/28, but the authorization for the test will expire on 08/18. We need the authorization to be approved for a later date. Please advise.

## 2023-06-29 ENCOUNTER — Telehealth: Payer: Self-pay

## 2023-06-29 ENCOUNTER — Other Ambulatory Visit: Payer: Self-pay

## 2023-06-29 DIAGNOSIS — K2100 Gastro-esophageal reflux disease with esophagitis, without bleeding: Secondary | ICD-10-CM

## 2023-06-29 DIAGNOSIS — K449 Diaphragmatic hernia without obstruction or gangrene: Secondary | ICD-10-CM

## 2023-06-29 NOTE — Telephone Encounter (Signed)
Received a call from [facility], surgery scheduler with [facility]. Patient has been scheduled for cTIF on 09/03/23 at 8:30 am.   cTIF diet/restrictions, and 2-week post procedure follow up appt info have been mailed and sent to patient via MyChart.

## 2023-07-02 ENCOUNTER — Encounter: Payer: Self-pay | Admitting: Nurse Practitioner

## 2023-07-02 ENCOUNTER — Inpatient Hospital Stay: Payer: BC Managed Care – PPO | Attending: Hematology

## 2023-07-02 ENCOUNTER — Inpatient Hospital Stay: Payer: BC Managed Care – PPO | Admitting: Nurse Practitioner

## 2023-07-02 VITALS — BP 157/85 | HR 93 | Temp 98.0°F | Resp 17 | Wt 175.9 lb

## 2023-07-02 DIAGNOSIS — Z79811 Long term (current) use of aromatase inhibitors: Secondary | ICD-10-CM | POA: Insufficient documentation

## 2023-07-02 DIAGNOSIS — R7401 Elevation of levels of liver transaminase levels: Secondary | ICD-10-CM | POA: Diagnosis not present

## 2023-07-02 DIAGNOSIS — C50212 Malignant neoplasm of upper-inner quadrant of left female breast: Secondary | ICD-10-CM | POA: Diagnosis present

## 2023-07-02 DIAGNOSIS — D649 Anemia, unspecified: Secondary | ICD-10-CM

## 2023-07-02 DIAGNOSIS — Z170 Estrogen receptor positive status [ER+]: Secondary | ICD-10-CM | POA: Diagnosis not present

## 2023-07-02 DIAGNOSIS — C50911 Malignant neoplasm of unspecified site of right female breast: Secondary | ICD-10-CM | POA: Diagnosis not present

## 2023-07-02 DIAGNOSIS — C50919 Malignant neoplasm of unspecified site of unspecified female breast: Secondary | ICD-10-CM

## 2023-07-02 LAB — CBC WITH DIFFERENTIAL (CANCER CENTER ONLY)
Abs Immature Granulocytes: 0.02 10*3/uL (ref 0.00–0.07)
Basophils Absolute: 0.1 10*3/uL (ref 0.0–0.1)
Basophils Relative: 1 %
Eosinophils Absolute: 0.3 10*3/uL (ref 0.0–0.5)
Eosinophils Relative: 3 %
HCT: 42.1 % (ref 36.0–46.0)
Hemoglobin: 13.7 g/dL (ref 12.0–15.0)
Immature Granulocytes: 0 %
Lymphocytes Relative: 12 %
Lymphs Abs: 0.9 10*3/uL (ref 0.7–4.0)
MCH: 30.4 pg (ref 26.0–34.0)
MCHC: 32.5 g/dL (ref 30.0–36.0)
MCV: 93.3 fL (ref 80.0–100.0)
Monocytes Absolute: 0.7 10*3/uL (ref 0.1–1.0)
Monocytes Relative: 9 %
Neutro Abs: 6 10*3/uL (ref 1.7–7.7)
Neutrophils Relative %: 75 %
Platelet Count: 330 10*3/uL (ref 150–400)
RBC: 4.51 MIL/uL (ref 3.87–5.11)
RDW: 13.7 % (ref 11.5–15.5)
WBC Count: 8 10*3/uL (ref 4.0–10.5)
nRBC: 0 % (ref 0.0–0.2)

## 2023-07-02 LAB — CMP (CANCER CENTER ONLY)
ALT: 65 U/L — ABNORMAL HIGH (ref 0–44)
AST: 48 U/L — ABNORMAL HIGH (ref 15–41)
Albumin: 4.2 g/dL (ref 3.5–5.0)
Alkaline Phosphatase: 86 U/L (ref 38–126)
Anion gap: 9 (ref 5–15)
BUN: 18 mg/dL (ref 6–20)
CO2: 26 mmol/L (ref 22–32)
Calcium: 9.9 mg/dL (ref 8.9–10.3)
Chloride: 100 mmol/L (ref 98–111)
Creatinine: 0.86 mg/dL (ref 0.44–1.00)
GFR, Estimated: >60 mL/min (ref >60)
Glucose, Bld: 169 mg/dL — ABNORMAL HIGH (ref 70–99)
Potassium: 4 mmol/L (ref 3.5–5.1)
Sodium: 135 mmol/L (ref 135–145)
Total Bilirubin: 0.5 mg/dL (ref 0.3–1.2)
Total Protein: 8 g/dL (ref 6.5–8.1)

## 2023-07-02 NOTE — Progress Notes (Signed)
Patient Care Team: Hague, Myrene Galas, MD as PCP - General (Internal Medicine) Harriette Bouillon, MD as Consulting Physician (General Surgery) Malachy Mood, MD as Consulting Physician (Hematology) Antony Blackbird, MD as Consulting Physician (Radiation Oncology) Shellia Cleverly, DO as Consulting Physician (Gastroenterology)   CHIEF COMPLAINT: Follow-up bilateral breast cancer  Oncology History Overview Note   Cancer Staging  Invasive lobular carcinoma of right breast, stage 1 (HCC) Staging form: Breast, AJCC 8th Edition - Clinical stage from 01/07/2022: Stage Unknown (cT1c, cNX, cM0, G1, ER+, PR+, HER2-) - Signed by Malachy Mood, MD on 01/24/2022  Malignant neoplasm of upper-inner quadrant of left breast in female, estrogen receptor positive (HCC) Staging form: Breast, AJCC 8th Edition - Clinical stage from 11/20/2021: Stage IA (cT1c, cN0, cM0, G2, ER+, PR-, HER2-) - Signed by Malachy Mood, MD on 11/26/2021 - Pathologic stage from 01/07/2022: Stage IIA (pT2, pN0, cM0, G2, ER-, PR-, HER2-) - Signed by Malachy Mood, MD on 01/24/2022     Malignant neoplasm of upper-inner quadrant of left breast in female, estrogen receptor positive (HCC)  11/05/2021 Mammogram   CLINICAL DATA:  Screening recall for a possible left breast asymmetry.   EXAM: DIGITAL DIAGNOSTIC UNILATERAL LEFT MAMMOGRAM WITH TOMOSYNTHESIS AND CAD; ULTRASOUND LEFT BREAST LIMITED  IMPRESSION: 1. Irregular 1.2 cm mass in the left breast at 11 o'clock, 2 cm the nipple, suspicious for malignancy.   11/20/2021 Cancer Staging   Staging form: Breast, AJCC 8th Edition - Clinical stage from 11/20/2021: Stage IA (cT1c, cN0, cM0, G2, ER+, PR-, HER2-) - Signed by Malachy Mood, MD on 11/26/2021 Stage prefix: Initial diagnosis Histologic grading system: 3 grade system   11/20/2021 Initial Biopsy   Diagnosis Breast, left, needle core biopsy, 11 o'clock, ribbon clip - INVASIVE DUCTAL CARCINOMA - DUCTAL CARCINOMA IN SITU - SEE COMMENT Microscopic  Comment based on the biopsy, the carcinoma appears Nottingham grade 2 of 3 and measures 0.6 cm in greatest linear extent.  PROGNOSTIC INDICATORS Results: The tumor cells are NEGATIVE for Her2 (1+). Estrogen Receptor: 10%, POSITIVE, WEAK STAINING INTENSITY Progesterone Receptor: 0%, NEGATIVE Proliferation Marker Ki67: 20%   11/26/2021 Initial Diagnosis   Malignant neoplasm of upper-inner quadrant of left breast in female, estrogen receptor positive (HCC)   11/27/2021 Genetic Testing   [employer] was Negative. Report date is 12/11/2021.  The CancerNext gene panel offered by [employer] includes sequencing, rearrangement analysis, and RNA analysis for the following 36 genes:   APC, ATM, AXIN2, BARD1, BMPR1A, BRCA1, BRCA2, BRIP1, CDH1, CDK4, CDKN2A, CHEK2, DICER1, HOXB13, EPCAM, GREM1, MLH1, MSH2, MSH3, MSH6, MUTYH, NBN, NF1, NTHL1, PALB2, PMS2, POLD1, POLE, PTEN, RAD51C, RAD51D, RECQL, SMAD4, SMARCA4, STK11, and TP53.    01/07/2022 Cancer Staging   Staging form: Breast, AJCC 8th Edition - Pathologic stage from 01/07/2022: Stage IIA (pT2, pN0, cM0, G2, ER-, PR-, HER2-) - Signed by Malachy Mood, MD on 01/24/2022 Stage prefix: Initial diagnosis Histologic grading system: 3 grade system Residual tumor (R): R0 - None   01/07/2022 Definitive Surgery   FINAL MICROSCOPIC DIAGNOSIS:   A. BREAST, RIGHT NIPPLE, BIOPSY:  - Benign breast tissue.  - No malignancy identified.   B. BREAST, LEFT NIPPLE, BIOPSY:  - Benign breast tissue.  - No malignancy identified.   C. BREAST, RIGHT, MASTECTOMY:  - Invasive lobular carcinoma, 1.1 cm.  - Ductal carcinoma in situ, 1 cm.  - Invasive carcinoma focally extends to the anterior margin.  - Fibrocystic changes with usual ductal hyperplasia and calcifications.  - Sclerosing adenosis and fibroadenomatoid  change.  - See oncology table and comment.   D. LYMPH NODE, LEFT AXILLARY, SENTINEL, EXCISION:  - One lymph node negative for metastatic carcinoma  (0/1).   E. LYMPH NODE, LEFT AXILLARY, SENTINEL, EXCISION:  - One lymph node negative for metastatic carcinoma (0/1).   F. BREAST, LEFT, MASTECTOMY:  - Invasive and in situ ductal carcinoma, 2.1 cm.  - Margins negative for carcinoma.  - See oncology table.    01/07/2022 Receptors her2   F1. PROGNOSTIC INDICATOR RESULTS:   The tumor cells are NEGATIVE for Her2 (0).   Estrogen Receptor: NEGATIVE  Progesterone Receptor: NEGATIVE  Proliferation Marker Ki-67: 10%    02/12/2022 - 06/06/2022 Chemotherapy   Patient is on Treatment Plan : BREAST TC q21d     08/2022 -  Anti-estrogen oral therapy   Tamoxifen   Invasive lobular carcinoma of right breast, stage 1 (HCC)  11/27/2021 Genetic Testing   [employer] was Negative. Report date is 12/11/2021.  The CancerNext gene panel offered by [employer] includes sequencing, rearrangement analysis, and RNA analysis for the following 36 genes:   APC, ATM, AXIN2, BARD1, BMPR1A, BRCA1, BRCA2, BRIP1, CDH1, CDK4, CDKN2A, CHEK2, DICER1, HOXB13, EPCAM, GREM1, MLH1, MSH2, MSH3, MSH6, MUTYH, NBN, NF1, NTHL1, PALB2, PMS2, POLD1, POLE, PTEN, RAD51C, RAD51D, RECQL, SMAD4, SMARCA4, STK11, and TP53.    01/07/2022 Cancer Staging   Staging form: Breast, AJCC 8th Edition - Clinical stage from 01/07/2022: Stage Unknown (cT1c, cNX, cM0, G1, ER+, PR+, HER2-) - Signed by Malachy Mood, MD on 01/24/2022 Stage prefix: Initial diagnosis Histologic grading system: 3 grade system   01/07/2022 Definitive Surgery   FINAL MICROSCOPIC DIAGNOSIS:   A. BREAST, RIGHT NIPPLE, BIOPSY:  - Benign breast tissue.  - No malignancy identified.   B. BREAST, LEFT NIPPLE, BIOPSY:  - Benign breast tissue.  - No malignancy identified.   C. BREAST, RIGHT, MASTECTOMY:  - Invasive lobular carcinoma, 1.1 cm.  - Ductal carcinoma in situ, 1 cm.  - Invasive carcinoma focally extends to the anterior margin.  - Fibrocystic changes with usual ductal hyperplasia and calcifications.  -  Sclerosing adenosis and fibroadenomatoid change.  - See oncology table and comment.   D. LYMPH NODE, LEFT AXILLARY, SENTINEL, EXCISION:  - One lymph node negative for metastatic carcinoma (0/1).   E. LYMPH NODE, LEFT AXILLARY, SENTINEL, EXCISION:  - One lymph node negative for metastatic carcinoma (0/1).   F. BREAST, LEFT, MASTECTOMY:  - Invasive and in situ ductal carcinoma, 2.1 cm.  - Margins negative for carcinoma.  - See oncology table.    01/07/2022 Receptors her2   C7.  PROGNOSTIC INDICATOR RESULTS:   The tumor cells are NEGATIVE for Her2 (1+).   Estrogen Receptor: POSITIVE, 30% WEAK STAINING INTENSITY  Progesterone Receptor: POSITIVE, 80% MODERATE STAINING INTENSITY  Proliferation Marker Ki-67: <5%    01/24/2022 Initial Diagnosis   Invasive lobular carcinoma of right breast, stage 1 (HCC)   08/2022 -  Anti-estrogen oral therapy   Tamoxifen      CURRENT THERAPY:  - Anti-estrogen therapy with Tamoxifen, starting 08/2022 , and stop due to side effects 02/2023 -Exemestane to start 04/2023, stopped after 2 weeks due to joint pain (hands) and headaches -Anastrozole starting 05/2023, held briefly 06/2023 for rash, re-challenged   INTERVAL HISTORY Mr. Whigham returns for follow-up as scheduled, last seen by me 06/04/2023 and began low-dose anastrozole and prescribed Veozah, which insurance denied and she never started.  She developed an itching rash under breast folds and behind knees. She  has psoriasis. Saw PCP and received injection and stopped anastrozole 8/8. Rash resolved. She continued having hot flashes even while off AI. She would like to try to continue AI. Headaches resolved, denies other pain.   ROS  All other systems reviewed and negative  Past Medical History:  Diagnosis Date   Allergic rhinitis 12/02/2021   Anemia    Anemia of chronic disease 04/08/2022   Anxiety    Bacterial vaginosis 05/25/2023   Bronchiectasis without complication (HCC) 04/07/2022   Cancer  (HCC)    Chest pain    Complication of anesthesia    HA   Depression    Dyspnea    due to chemo   Elevated cholesterol    Family history of breast cancer 11/29/2021   Gastro-esophageal reflux disease without esophagitis 12/02/2021   Genetic testing 12/11/2021   [employer] [facility] was Negative. Report date is 12/11/2021.     The CancerNext gene panel offered by [employer] includes sequencing, rearrangement analysis, and RNA analysis for the following 36 genes:   APC, ATM, AXIN2, BARD1, BMPR1A, BRCA1, BRCA2, BRIP1, CDH1, CDK4, CDKN2A, CHEK2, DICER1, HOXB13, EPCAM, GREM1, MLH1, MSH2, MSH3, MSH6, MUTYH, NBN, NF1, NTHL1, PALB2, PMS2, POLD1, POLE, PTEN, RAD51   GERD (gastroesophageal reflux disease)    Hiatal hernia with GERD    History of therapeutic radiation 12/02/2021   Hypokalemia 04/08/2022   Invasive lobular carcinoma of right breast, stage 1 (HCC) 01/24/2022   Jaw pain    Malignant neoplasm of upper-inner quadrant of left breast in female, estrogen receptor positive (HCC) 11/26/2021   Menorrhagia 05/25/2023   Neutropenic fever (HCC) 04/07/2022   Non Hodgkin's lymphoma (HCC)    Obesity (BMI 30-39.9) 04/08/2022   Psoriasis    Pulmonary nodules 04/07/2022   Restless leg syndrome 04/08/2022   Vitamin D deficiency 05/25/2023     Past Surgical History:  Procedure Laterality Date   BREAST BIOPSY Left 11/20/2021   BREAST RECONSTRUCTION WITH PLACEMENT OF TISSUE EXPANDER AND ALLODERM Bilateral 01/07/2022   Procedure: BILATERAL BREAST RECONSTRUCTION WITH PLACEMENT OF TISSUE EXPANDERS AND ALLODERM;  Surgeon: Glenna Fellows, MD;  Location: [facility];  Service: Plastics;  Laterality: Bilateral;   COLONOSCOPY  04/07/2018   [facility]. Dr. Charna Elizabeth. Four small polyps, 2 in the sigmoid colon and 2 in teh proximal ascending colon - removed by cold biopsies. One 7 mm sessile polyps in the mid sigmoid colon, removed with a hot snare x 1; resected and retrived.  The examined portion of the ileum was normal. The ecamination was otherwise normal on direct and retroflexion views.   ESOPHAGOGASTRODUODENOSCOPY  11/19/2015   [facility]. Dr. Charna Elizabeth. LA Grade C reflux esophagitis. Esophageal mucosal changes suggestive of Eosinophilic esophagitis noted in the distal 2/3 of the esophagus-biopsies done. Medium-sized hiatal hernia noted on retroplexion. Moderate diffuse gastritis. Normal examined duodenum.   ESOPHAGOGASTRODUODENOSCOPY  02/12/2023   [facility]. Dr. Jeani Hawking.  LA Grade D reflux esophagitis with no bleeding. 3 cm hiatal hernia. Gastrophageal flap valve classified as Hill Grade II ( fold present, opens with respiration). Normal stomach. Normal examined duodenum. No specimens collected.   NIPPLE SPARING MASTECTOMY Right 01/07/2022   Procedure: RIGHT NIPPLE SPARING MASTECTOMY;  Surgeon: Harriette Bouillon, MD;  Location: [facility];  Service: General;  Laterality: Right;   NIPPLE SPARING MASTECTOMY WITH SENTINEL LYMPH NODE BIOPSY Left 01/07/2022   Procedure: LEFT NIPPLE SPARING MASTECTOMY WITH SENTINEL LYMPH NODE BIOPSY;  Surgeon: Harriette Bouillon, MD;  Location:  [facility];  Service: General;  Laterality: Left;   REMOVAL OF BILATERAL TISSUE EXPANDERS WITH PLACEMENT OF BILATERAL BREAST IMPLANTS Bilateral 07/15/2022   Procedure: REMOVAL OF BILATERAL TISSUE EXPANDERS WITH PLACEMENT OF BILATERAL BREAST IMPLANTS;  Surgeon: Glenna Fellows, MD;  Location: [facility];  Service: Plastics;  Laterality: Bilateral;   TUBAL LIGATION       Outpatient Encounter Medications as of 07/02/2023  Medication Sig   ALPRAZolam (XANAX) 0.5 MG tablet Take 0.5 mg by mouth daily as needed for anxiety or sleep.   Fezolinetant (VEOZAH) 45 MG TABS Take 1 tablet (45 mg total) by mouth daily.   FLUoxetine (PROZAC) 20 MG capsule Take 1 capsule by mouth daily.   lisdexamfetamine (VYVANSE) 50 MG capsule Take  50 mg by mouth as needed (concentration).   loratadine (CLARITIN) 10 MG tablet Take 10 mg by mouth 2 (two) times daily.   rOPINIRole (REQUIP) 0.25 MG tablet Take 0.25 mg by mouth at bedtime.   rosuvastatin (CRESTOR) 5 MG tablet Take 5 mg by mouth daily.   venlafaxine XR (EFFEXOR-XR) 37.5 MG 24 hr capsule Take 37.5 mg by mouth daily.   zolpidem (AMBIEN) 5 MG tablet Take 5 mg by mouth at bedtime.   anastrozole (ARIMIDEX) 1 MG tablet Take 1 tablet (1 mg total) by mouth daily. (Patient not taking: Reported on 07/02/2023)   [DISCONTINUED] prochlorperazine (COMPAZINE) 10 MG tablet Take 1 tablet (10 mg total) by mouth every 6 (six) hours as needed (Nausea or vomiting).   No facility-administered encounter medications on file as of 07/02/2023.     Today's Vitals   07/02/23 1139 07/02/23 1151  BP: (!) 157/85   Pulse: 93   Resp: 17   Temp: 98 F (36.7 C)   TempSrc: Oral   SpO2: 100%   Weight: 175 lb 14.4 oz (79.8 kg)   PainSc:  0-No pain   Body mass index is 31.16 kg/m.   PHYSICAL EXAM GENERAL:alert, no distress and comfortable SKIN: no rash  EYES: sclera clear NECK: without mass LYMPH:  no palpable cervical or supraclavicular lymphadenopathy  LUNGS: clear with normal breathing effort HEART: regular rate & rhythm, no lower extremity edema ABDOMEN: abdomen soft, non-tender and normal bowel sounds NEURO: alert & oriented x 3 with fluent speech, no focal motor/sensory deficits Breast exam: Deferred   CBC    Component Value Date/Time   WBC 8.0 07/02/2023 1102   WBC 4.3 04/09/2022 0349   RBC 4.51 07/02/2023 1102   HGB 13.7 07/02/2023 1102   HCT 42.1 07/02/2023 1102   PLT 330 07/02/2023 1102   MCV 93.3 07/02/2023 1102   MCH 30.4 07/02/2023 1102   MCHC 32.5 07/02/2023 1102   RDW 13.7 07/02/2023 1102   LYMPHSABS 0.9 07/02/2023 1102   MONOABS 0.7 07/02/2023 1102   EOSABS 0.3 07/02/2023 1102   BASOSABS 0.1 07/02/2023 1102     CMP     Component Value Date/Time   NA 135  07/02/2023 1102   K 4.0 07/02/2023 1102   CL 100 07/02/2023 1102   CO2 26 07/02/2023 1102   GLUCOSE 169 (H) 07/02/2023 1102   BUN 18 07/02/2023 1102   CREATININE 0.86 07/02/2023 1102   CALCIUM 9.9 07/02/2023 1102   PROT 8.0 07/02/2023 1102   ALBUMIN 4.2 07/02/2023 1102   AST 48 (H) 07/02/2023 1102   ALT 65 (H) 07/02/2023 1102   ALKPHOS 86 07/02/2023 1102   BILITOT 0.5 07/02/2023 1102   GFRNONAA >60 07/02/2023 1102  ASSESSMENT & PLAN:Erika Hodge is a 57 y.o. 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 -she opted to proceed with b/l mastectomies with reconstruction on 01/07/22 with Dr. Luisa Hart and Dr. Leta Baptist. Pathology revealed b/l breast cancers. Left breast showed 2.1 cm invasive and in situ ductal carcinoma. Margins and lymph nodes negative (0/2). Repeated ER/PR/HER2 were all negative (ER 10% weak (+) on biopsy) -she developed left IMF necrotic tissue, excised by Dr. Leta Baptist on 01/27/22. -given prior treatment with adriamycin, she completed 4 cycles TC 02/12/22 - 06/06/22, tolerated fairly  -we reviewed that adjuvant radiation is not recommended due to her previous radiation for lymphoma.    Invasive lobular carcinoma of right breast, stage 1; stage I pT1c, ER weakly+/PR+/Her2-, and DCIS  -incidental finding on b/l mastectomies on 01/07/22 for left breast cancer. Pathology from right breast showed: 1.1 cm invasive lobular carcinoma, grade 1, in LIQ; 1 cm DCIS, intermediate grade, in UIQ. ILC showed ER 30% weakly positive, PR 80% moderately positive, Her2 negative. ILC focally involves adjacent anterior margin.  -She has tried low-dose and full dose tamoxifen which she initially started 08/2022 after she weaned off Prozac.   -Unfortunately she did not tolerate Tamoxifen even with  increased effexor, with joint pain, hot flash, brain fog, and irritability.  She stopped in 02/2023 and went back on prozac -Due to post menopausal status, she began Exemestane in 04/2023, stopped after 2 weeks due to joint pain (hands) and headaches. Pain stopped when she dc'd, but still has headaches  -7/25: began anastrozole.  Prescribed veozah for vasomotor sx but insurance denied and she never started -07/02/23: Ms Casad appears stable.  She had a rash to her breast folds and behind the knees while taking anastrozole.  She has known history of psoriasis, and by my notes, had a rash on 06/04/2023 the day I prescribed anastrozole.  It is likely not related. Otherwise tolerated it well -She agrees to rechallenge anastrozole, she will let me know if she develops recurrent rash or other intolerable side effects.  At that point we will try letrozole -Labs reviewed, CBC normal. She has persistent mild transaminitis, which is new for her. No RUQ ttp. Has not been taking tylenol or drinking at all for past month. Will obtain RUQ [place], I will call with results -Follow-up in 3 months, or sooner if needed   Headaches -chronic, worsened with Exemestane -Previously took [employer] but stopped 2 months ago when she developed reflux symptoms -Switched to tylenol, takes 500 mg BID most days -I reviewed possible medication oversue/rebound headache phenomenon and encouraged her to cut back tylenol -06/04/23: I offered referral to neuro onc, she declined for now -07/02/23: Resolved   Hot flashes -Secondary to antiestrogen therapy, Effexor was not effective even at high dose. -Hesitant to prescribe gabapentin given her other CNS medications -7/25: Rx Veozah, LFTs slightly elevated, possibly due to Tylenol and/or statin; she does not drink much alcohol.  Will monitor closely. Insurance denied -07/02/23: Will resubmit Veozah for hot flashes, which she had even while off AI.  If insurance denies again will try low-dose  gabapentin   Dermatitis  -06/04/23: She has mild rash to chest wall and under breasts, exam otherwise benign -Takes Benadryl PRN. I recommend to add topical hydrocortisone  -She had rash under breast folds  and behind knees after starting anastrozole. Also has psoriasis. PCP gave injection and rash resolved.  -Unlikely related to anastrozole -07/02/23: Will re-challenge and monitor closely. If rash returns, will try letrozole   PLAN: -Labs reviewed -ABD [place] in 1-2 weeks for transaminitis, will call with results  -Re-challenge Anastrozole; if SE's return or intolerable, will try letrozole -Resubmit Veozah for vasomotor sx, if insurance declines will try low dose gabapentin -F/up in 3 months, or sooner if needed  Orders Placed This Encounter  Procedures   US Abdomen Limited    Standing Status:   Future    Standing Expiration Date:   07/01/2024    Order Specific Question:   Reason for Exam (SYMPTOM  OR DIAGNOSIS REQUIRED)    Answer:   Transaminitis, h/o bilateral breast cancer; r/o recurrence    Order Specific Question:   Preferred imaging location?    Answer:   [facility]      All questions were answered. The patient knows to call the clinic with any problems, questions or concerns. No barriers to learning were detected. I spent 20 minutes counseling the patient face to face. The total time spent in the appointment was 30 minutes and more than 50% was on counseling, review of test results, and coordination of care.   Santiago Glad, NP-C 07/02/2023

## 2023-07-03 ENCOUNTER — Telehealth: Payer: Self-pay | Admitting: Hematology

## 2023-07-07 ENCOUNTER — Telehealth: Payer: Self-pay

## 2023-07-07 NOTE — Telephone Encounter (Signed)
Pt called in to say that pharmacy was unable to fill the Rx for Veozah that was prescribed by Santiago Glad, NP. It was discovered that this medication is excluded from her insurance plan's benefits. Spoke w/ L. Azucena Cecil, NP. She states she will call in a different medication for pt. Pt notified to expect a new prescription--to follow instructions on label. She verbalizes understanding.

## 2023-07-08 ENCOUNTER — Ambulatory Visit: Payer: BC Managed Care – PPO

## 2023-07-08 DIAGNOSIS — I700 Atherosclerosis of aorta: Secondary | ICD-10-CM

## 2023-07-08 DIAGNOSIS — Z923 Personal history of irradiation: Secondary | ICD-10-CM

## 2023-07-08 DIAGNOSIS — R072 Precordial pain: Secondary | ICD-10-CM

## 2023-07-08 DIAGNOSIS — I3481 Nonrheumatic mitral (valve) annulus calcification: Secondary | ICD-10-CM

## 2023-07-08 DIAGNOSIS — Z9221 Personal history of antineoplastic chemotherapy: Secondary | ICD-10-CM

## 2023-07-08 DIAGNOSIS — E7800 Pure hypercholesterolemia, unspecified: Secondary | ICD-10-CM

## 2023-07-08 DIAGNOSIS — Z01810 Encounter for preprocedural cardiovascular examination: Secondary | ICD-10-CM

## 2023-07-08 DIAGNOSIS — I2510 Atherosclerotic heart disease of native coronary artery without angina pectoris: Secondary | ICD-10-CM

## 2023-07-08 LAB — ECHOCARDIOGRAM COMPLETE
P 1/2 time: 325 msec
S' Lateral: 3 cm

## 2023-07-09 ENCOUNTER — Other Ambulatory Visit: Payer: BC Managed Care – PPO

## 2023-07-09 ENCOUNTER — Ambulatory Visit (HOSPITAL_COMMUNITY)
Admission: RE | Admit: 2023-07-09 | Discharge: 2023-07-09 | Disposition: A | Discharge status: Home / Self Care | Payer: BC Managed Care – PPO | Source: Ambulatory Visit | Attending: Nurse Practitioner | Admitting: Nurse Practitioner

## 2023-07-09 DIAGNOSIS — R7401 Elevation of levels of liver transaminase levels: Secondary | ICD-10-CM | POA: Insufficient documentation

## 2023-07-13 ENCOUNTER — Other Ambulatory Visit: Payer: Self-pay | Admitting: Nurse Practitioner

## 2023-07-13 MED ORDER — GABAPENTIN 100 MG PO CAPS: ORAL_CAPSULE | ORAL | 0 refills | Status: DC

## 2023-07-16 ENCOUNTER — Telehealth: Payer: Self-pay

## 2023-07-16 NOTE — Telephone Encounter (Addendum)
Called patient and relayed message below as per Santiago Glad NP. Patient voiced full understanding.   ----- Message from Pollyann Samples sent at 07/16/2023  8:24 AM EDT ----- Please let patient know abdominal US does not show any abnormality in the liver to explain elevated LFTs. Although it's mild, I recommend she f/up with GI, Dr. Barron Alvine.  Thanks Lacie NP

## 2023-07-20 ENCOUNTER — Ambulatory Visit: Payer: BC Managed Care – PPO | Attending: Surgery

## 2023-07-20 VITALS — Wt 176.4 lb

## 2023-07-20 DIAGNOSIS — Z483 Aftercare following surgery for neoplasm: Secondary | ICD-10-CM | POA: Insufficient documentation

## 2023-07-20 NOTE — Therapy (Signed)
OUTPATIENT PHYSICAL THERAPY SOZO SCREENING NOTE   Patient Name: Erika Hodge MRN: [id number] DOB:1966-09-06, 57 y.o., female Today's Date: 07/20/2023  PCP: Galvin Proffer, MD REFERRING PROVIDER: Harriette Bouillon, MD   PT End of Session - 07/20/23 1623     Visit Number 22   #  unchanged due to screen only   PT Start Time 1621    PT Stop Time 1625    PT Time Calculation (min) 4 min    Activity Tolerance Patient tolerated treatment well    Behavior During Therapy [facility] for tasks assessed/performed             Past Medical History:  Diagnosis Date   Allergic rhinitis 12/02/2021   Anemia    Anemia of chronic disease 04/08/2022   Anxiety    Bacterial vaginosis 05/25/2023   Bronchiectasis without complication (HCC) 04/07/2022   Cancer (HCC)    Chest pain    Complication of anesthesia    HA   Depression    Dyspnea    due to chemo   Elevated cholesterol    Family history of breast cancer 11/29/2021   Gastro-esophageal reflux disease without esophagitis 12/02/2021   Genetic testing 12/11/2021   [facility] CancerNext was Negative. Report date is 12/11/2021.     The CancerNext gene panel offered by [employer] includes sequencing, rearrangement analysis, and RNA analysis for the following 36 genes:   APC, ATM, AXIN2, BARD1, BMPR1A, BRCA1, BRCA2, BRIP1, CDH1, CDK4, CDKN2A, CHEK2, DICER1, HOXB13, EPCAM, GREM1, MLH1, MSH2, MSH3, MSH6, MUTYH, NBN, NF1, NTHL1, PALB2, PMS2, POLD1, POLE, PTEN, RAD51   GERD (gastroesophageal reflux disease)    Hiatal hernia with GERD    History of therapeutic radiation 12/02/2021   Hypokalemia 04/08/2022   Invasive lobular carcinoma of right breast, stage 1 (HCC) 01/24/2022   Jaw pain    Malignant neoplasm of upper-inner quadrant of left breast in female, estrogen receptor positive (HCC) 11/26/2021   Menorrhagia 05/25/2023   Neutropenic fever (HCC) 04/07/2022   Non Hodgkin's lymphoma (HCC)    Obesity (BMI 30-39.9) 04/08/2022   Psoriasis    Pulmonary  nodules 04/07/2022   Restless leg syndrome 04/08/2022   Vitamin D deficiency 05/25/2023   Past Surgical History:  Procedure Laterality Date   BREAST BIOPSY Left 11/20/2021   BREAST RECONSTRUCTION WITH PLACEMENT OF TISSUE EXPANDER AND ALLODERM Bilateral 01/07/2022   Procedure: BILATERAL BREAST RECONSTRUCTION WITH PLACEMENT OF TISSUE EXPANDERS AND ALLODERM;  Surgeon: Glenna Fellows, MD;  Location: [facility];  Service: Plastics;  Laterality: Bilateral;   COLONOSCOPY  04/07/2018   [facility]. Dr. Charna Elizabeth. Four small polyps, 2 in the sigmoid colon and 2 in teh proximal ascending colon - removed by cold biopsies. One 7 mm sessile polyps in the mid sigmoid colon, removed with a hot snare x 1; resected and retrived. The examined portion of the ileum was normal. The ecamination was otherwise normal on direct and retroflexion views.   ESOPHAGOGASTRODUODENOSCOPY  11/19/2015   [facility]. Dr. Charna Elizabeth. LA Grade C reflux esophagitis. Esophageal mucosal changes suggestive of Eosinophilic esophagitis noted in the distal 2/3 of the esophagus-biopsies done. Medium-sized hiatal hernia noted on retroplexion. Moderate diffuse gastritis. Normal examined duodenum.   ESOPHAGOGASTRODUODENOSCOPY  02/12/2023   [facility]. Dr. Jeani Hawking.  LA Grade D reflux esophagitis with no bleeding. 3 cm hiatal hernia. Gastrophageal flap valve classified as Hill Grade II ( fold present, opens with respiration). Normal stomach. Normal examined duodenum. No specimens collected.  NIPPLE SPARING MASTECTOMY Right 01/07/2022   Procedure: RIGHT NIPPLE SPARING MASTECTOMY;  Surgeon: Harriette Bouillon, MD;  Location: [facility];  Service: General;  Laterality: Right;   NIPPLE SPARING MASTECTOMY WITH SENTINEL LYMPH NODE BIOPSY Left 01/07/2022   Procedure: LEFT NIPPLE SPARING MASTECTOMY WITH SENTINEL LYMPH NODE BIOPSY;  Surgeon: Harriette Bouillon, MD;  Location:  [facility];  Service: General;  Laterality: Left;   REMOVAL OF BILATERAL TISSUE EXPANDERS WITH PLACEMENT OF BILATERAL BREAST IMPLANTS Bilateral 07/15/2022   Procedure: REMOVAL OF BILATERAL TISSUE EXPANDERS WITH PLACEMENT OF BILATERAL BREAST IMPLANTS;  Surgeon: Glenna Fellows, MD;  Location: [facility];  Service: Plastics;  Laterality: Bilateral;   TUBAL LIGATION     Patient Active Problem List   Diagnosis Date Noted   Abdominal bloating 05/26/2023   Constipation 05/26/2023   Chronic idiopathic constipation 05/26/2023   Coffee ground emesis 05/26/2023   Dysphagia 05/26/2023   Gastro-esophageal reflux disease with esophagitis 05/26/2023   Personal history of colonic polyps 05/26/2023   Vomiting without nausea 05/26/2023   Bacterial vaginosis 05/25/2023   Menorrhagia 05/25/2023   Vitamin D deficiency 05/25/2023   Anemia    Cancer (HCC)    Chest pain    Complication of anesthesia    Depression    Dyspnea    Elevated cholesterol    Jaw pain    Non Hodgkin's lymphoma (HCC)    Psoriasis    Hypokalemia 04/08/2022   Anemia of chronic disease 04/08/2022   Restless leg syndrome 04/08/2022   Obesity (BMI 30-39.9) 04/08/2022   Bronchiectasis without complication (HCC) 04/07/2022   Neutropenic fever (HCC) 04/07/2022   Pulmonary nodules 04/07/2022   Anxiety    Hiatal hernia with GERD    Invasive lobular carcinoma of right breast, stage 1 (HCC) 01/24/2022   Genetic testing 12/11/2021   Gastro-esophageal reflux disease without esophagitis 12/02/2021   Allergic rhinitis 12/02/2021   History of therapeutic radiation 12/02/2021   Family history of breast cancer 11/29/2021   Malignant neoplasm of upper-inner quadrant of left breast in female, estrogen receptor positive (HCC) 11/26/2021    REFERRING DIAG: left breast cancer at risk for lymphedema  THERAPY DIAG: Aftercare following surgery for neoplasm  PERTINENT HISTORY: Patient was diagnosed on 09/10/2021  with 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 Two negative axillary nodes were removed on the left side. An incidental finding of right breast invasive lobular carcinoma was also found and is ER/PR positive and HER2 negative. It is weakly ER positive, PR negative and HER2 negative with a Ki67 of 20%. She has a left wrist plate from a previous surgery in 2006 and a history of non-Hodgkins Lymphoma in 1985.   PRECAUTIONS: left UE Lymphedema risk, None  SUBJECTIVE: Pt returns for her 3 month L-Dex screen only.   PAIN:  Are you having pain? No  SOZO SCREENING: Patient was assessed today using the SOZO machine to determine the lymphedema index score. This was compared to her baseline score. It was determined that she is within the recommended range when compared to her baseline and no further action is needed at this time. She will continue SOZO screenings. These are done every 3 months for 2 years post operatively followed by every 6 months for 2 years, and then annually.   L-DEX FLOWSHEETS - 07/20/23 1600       L-DEX LYMPHEDEMA SCREENING   Measurement Type Unilateral  L-DEX MEASUREMENT EXTREMITY Upper Extremity    POSITION  Standing    DOMINANT SIDE Right    At Risk Side Left    BASELINE SCORE (UNILATERAL) 6.4    L-DEX SCORE (UNILATERAL) 7.6    VALUE CHANGE (UNILAT) 1.2            P: Can begin 6 month SOZO screens ~Feb, 2025.   Hermenia Bers, PTA 07/20/2023, 4:24 PM

## 2023-07-27 ENCOUNTER — Ambulatory Visit
Admission: RE | Admit: 2023-07-27 | Discharge: 2023-07-27 | Disposition: A | Discharge status: Home / Self Care | Payer: BC Managed Care – PPO | Source: Ambulatory Visit | Attending: Primary Care | Admitting: Primary Care

## 2023-07-27 ENCOUNTER — Ambulatory Visit (HOSPITAL_COMMUNITY): Payer: BC Managed Care – PPO

## 2023-07-27 DIAGNOSIS — C50911 Malignant neoplasm of unspecified site of right female breast: Secondary | ICD-10-CM

## 2023-07-27 DIAGNOSIS — R911 Solitary pulmonary nodule: Secondary | ICD-10-CM

## 2023-08-07 ENCOUNTER — Encounter: Payer: Self-pay | Admitting: Primary Care

## 2023-08-07 ENCOUNTER — Ambulatory Visit: Payer: BC Managed Care – PPO | Admitting: Primary Care

## 2023-08-07 ENCOUNTER — Telehealth: Payer: Self-pay | Admitting: Gastroenterology

## 2023-08-07 VITALS — BP 140/72 | HR 101 | Wt 195.0 lb

## 2023-08-07 DIAGNOSIS — R748 Abnormal levels of other serum enzymes: Secondary | ICD-10-CM

## 2023-08-07 DIAGNOSIS — R918 Other nonspecific abnormal finding of lung field: Secondary | ICD-10-CM

## 2023-08-07 NOTE — Telephone Encounter (Signed)
Left message for patient to call back  

## 2023-08-07 NOTE — Patient Instructions (Addendum)
CT chest showed no new or enlarging pulmoanry nodules, decreased size previous left lower lung nodule, now measuring 7-73mm. Stable smaller lung nodules, continue to follow-up in annual CT chest.  Liver enzymes were mildly elevated, this is likely due to the new medication (Arimidex) you were started on.  I would discuss with prescribing provider. Liver ultrasound was normal, no evidence of cirrhosis.   Follow-up: 1 year with Dr. Desai/ or sooner if needed with [facility] NP

## 2023-08-07 NOTE — Progress Notes (Unsigned)
@Patient  ID: Erika Hodge, female    DOB: [birth year], 57 y.o.   MRN: [id number]  Chief Complaint  Patient presents with   follow-up    CT f/u 07/27/23    Referring provider: Galvin Proffer, MD  HPI: 57 year old female, smoker.  Past medical history significant for bronchiectasis, pulmonary nodules, breast cancer.  Patient of Dr. Celine Mans, seen for initial consult on 04/23/2022.  Previous [facility] pulmonary encounter: 04/23/22- Dr. Daron Offer is a 57 y.o. woman with a history of triple negative breast cancer s/p bilateral mastectomy in Feb 2023 (still on chemotherapy) who presents for new patient evaluation.   Has history of NHL s/p radiation.   She had a recent admission end of May for bronchiectasis with exacerbation in the setting of febrile neutropenia. She was treated with antibiotics and discharged home on oral agents. She is being sent here for further evaluation of an abnormal CT Chest which showed bilateral pulmonary nodules the largest of which was 6.5 mm in size.   Feeling more short of breath. She does have some wheezing and has an albuterol inhaler but she's not sure if it helps. She does think albuterol nebs help more. Denies coughing or chest tightness.    She has three more treatments left on her chemo.   No childhood respiratory disease. Or asthma.  She had bronchitis once 7 years ago.   Never hospitalized for breathing prior to this.   She wonders if she is allergic to the cat. She does take a claritin daily for seasonal allergies. 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 3 dogs and cat.  Smoking history: previously 0.5 ppd x 10 years = 5 pack years.   08/08/2022 Patient presents today for follow-up after CT imaging chest. She is doing well without acute respiratory complaints. No shortness of breath symptoms. She has  an occasional dry cough which is not significant or impacting her quality of life. Feels cough is from PND. She uses flonase and Claritin D as needed. Last round of chem was July 28th, 2023. Weight is stable. No hemoptysis.    08/07/2023 - Interim hx  Patient present today for 1 year follow-up/ review CT chest results. Hx breast cancer and pulmoanry nodules.  She was recently changed from tamoxifen as she was experiencing trigger finger symptoms associated with the medication.  She started on Arimidex a couple of months ago and have reports having pretty significant hot flash symptoms.  Of note her liver enzymes have been mildly elevated over the last 2 to 3 months.  She had a normal liver ultrasound in August 2024.  She has minimal respiratory symptoms, she reports dyspnea has significantly improved since last year.  CT chest 07/27/23 showed decreased size previous left lower lung nodule, now measuring 7-28mm. Stable smaller lung nodules, continue to follow-up in annual CT chest. No new or enlarging pulmonary nodules. Stable small liver cysts. No evidence of bone metastatic disease.   Allergies  Allergen Reactions   Amoxicillin Rash and Other (See Comments)    Throat closing. Tolerates Cefepime   Ibuprofen Other (See Comments), Nausea And Vomiting and Nausea Only   Ciprofloxacin Other (See Comments)    Facial swelling and throat swelling   Codeine Nausea Only     There is no immunization history on file for this patient.  Past Medical History:  Diagnosis  Date   Allergic rhinitis 12/02/2021   Anemia    Anemia of chronic disease 04/08/2022   Anxiety    Bacterial vaginosis 05/25/2023   Bronchiectasis without complication (HCC) 04/07/2022   Cancer (HCC)    Chest pain    Complication of anesthesia    HA   Depression    Dyspnea    due to chemo   Elevated cholesterol    Family history of breast cancer 11/29/2021   Gastro-esophageal reflux disease without esophagitis 12/02/2021   Genetic  testing 12/11/2021   [facility] CancerNext was Negative. Report date is 12/11/2021.     The CancerNext gene panel offered by [employer] includes sequencing, rearrangement analysis, and RNA analysis for the following 36 genes:   APC, ATM, AXIN2, BARD1, BMPR1A, BRCA1, BRCA2, BRIP1, CDH1, CDK4, CDKN2A, CHEK2, DICER1, HOXB13, EPCAM, GREM1, MLH1, MSH2, MSH3, MSH6, MUTYH, NBN, NF1, NTHL1, PALB2, PMS2, POLD1, POLE, PTEN, RAD51   GERD (gastroesophageal reflux disease)    Hiatal hernia with GERD    History of therapeutic radiation 12/02/2021   Hypokalemia 04/08/2022   Invasive lobular carcinoma of right breast, stage 1 (HCC) 01/24/2022   Jaw pain    Malignant neoplasm of upper-inner quadrant of left breast in female, estrogen receptor positive (HCC) 11/26/2021   Menorrhagia 05/25/2023   Neutropenic fever (HCC) 04/07/2022   Non Hodgkin's lymphoma (HCC)    Obesity (BMI 30-39.9) 04/08/2022   Psoriasis    Pulmonary nodules 04/07/2022   Restless leg syndrome 04/08/2022   Vitamin D deficiency 05/25/2023    Tobacco History: Social History   Tobacco Use  Smoking Status Former   Current packs/day: 0.00   Average packs/day: 0.5 packs/day for 15.0 years (7.5 ttl pk-yrs)   Types: Cigarettes   Start date: 2001   Quit date: 2016   Years since quitting: 8.7   Passive exposure: Never  Smokeless Tobacco Never   Counseling given: Not Answered   Outpatient Medications Prior to Visit  Medication Sig Dispense Refill   anastrozole (ARIMIDEX) 1 MG tablet Take 1 tablet (1 mg total) by mouth daily. 30 tablet 3   FLUoxetine (PROZAC) 20 MG capsule Take 1 capsule by mouth daily.     gabapentin (NEURONTIN) 100 MG capsule Take 1 capsule by mouth daily at bedtime, can slowly titrate up to 1 capsule three (3) times daily if tolerated 60 capsule 0   lisdexamfetamine (VYVANSE) 50 MG capsule Take 50 mg by mouth as needed (concentration).     loratadine (CLARITIN) 10 MG tablet Take 10 mg by mouth 2 (two) times daily.      rOPINIRole (REQUIP) 0.25 MG tablet Take 0.25 mg by mouth at bedtime.     rosuvastatin (CRESTOR) 5 MG tablet Take 5 mg by mouth daily.     zolpidem (AMBIEN) 5 MG tablet Take 5 mg by mouth at bedtime.     ALPRAZolam (XANAX) 0.5 MG tablet Take 0.5 mg by mouth daily as needed for anxiety or sleep.     venlafaxine XR (EFFEXOR-XR) 37.5 MG 24 hr capsule Take 37.5 mg by mouth daily.     No facility-administered medications prior to visit.      Review of Systems  Review of Systems  Constitutional:  Positive for diaphoresis.  Respiratory:  Negative for cough and shortness of breath.   Gastrointestinal: Negative.   Psychiatric/Behavioral: Negative.       Physical Exam  BP (!) 140/72   Pulse (!) 101   Wt 195 lb (88.5 kg)   SpO2 97%  BMI 34.54 kg/m  Physical Exam Constitutional:      Appearance: Normal appearance.  HENT:     Head: Normocephalic and atraumatic.  Cardiovascular:     Rate and Rhythm: Normal rate and regular rhythm.  Pulmonary:     Effort: Pulmonary effort is normal.     Breath sounds: Normal breath sounds.  Skin:    General: Skin is warm and dry.  Neurological:     General: No focal deficit present.     Mental Status: She is alert and oriented to person, place, and time. Mental status is at baseline.  Psychiatric:        Mood and Affect: Mood normal.        Behavior: Behavior normal.        Thought Content: Thought content normal.        Judgment: Judgment normal.      Lab Results:  CBC    Component Value Date/Time   WBC 8.0 07/02/2023 1102   WBC 4.3 04/09/2022 0349   RBC 4.51 07/02/2023 1102   HGB 13.7 07/02/2023 1102   HCT 42.1 07/02/2023 1102   PLT 330 07/02/2023 1102   MCV 93.3 07/02/2023 1102   MCH 30.4 07/02/2023 1102   MCHC 32.5 07/02/2023 1102   RDW 13.7 07/02/2023 1102   LYMPHSABS 0.9 07/02/2023 1102   MONOABS 0.7 07/02/2023 1102   EOSABS 0.3 07/02/2023 1102   BASOSABS 0.1 07/02/2023 1102    BMET    Component Value Date/Time    NA 135 07/02/2023 1102   K 4.0 07/02/2023 1102   CL 100 07/02/2023 1102   CO2 26 07/02/2023 1102   GLUCOSE 169 (H) 07/02/2023 1102   BUN 18 07/02/2023 1102   CREATININE 0.86 07/02/2023 1102   CALCIUM 9.9 07/02/2023 1102   GFRNONAA >60 07/02/2023 1102    BNP    Component Value Date/Time   BNP 38.6 04/07/2022 1312    ProBNP No results found for: "PROBNP"  Imaging: CT Chest Wo Contrast  Result Date: 08/06/2023 CLINICAL DATA:  Follow-up lung nodules. Recently finished chemotherapy for bilateral breast cancer. Status post bilateral mastectomy in February 2024. EXAM: CT CHEST WITHOUT CONTRAST TECHNIQUE: Multidetector CT imaging of the chest was performed following the standard protocol without IV contrast. RADIATION DOSE REDUCTION: This exam was performed according to the departmental dose-optimization program which includes automated exposure control, adjustment of the mA and/or kV according to patient size and/or use of iterative reconstruction technique. COMPARISON:  Cardiac/coronary CT dated 06/02/2023 and chest CT dated 08/05/2022. FINDINGS: Cardiovascular: Atheromatous calcifications, including the coronary arteries and aorta. Normal-sized heart. No pericardial effusion. Mediastinum/Nodes: No enlarged lymph nodes. Stable thyroid gland, trachea and esophagus. Lungs/Pleura: The previously demonstrated 7 x 6 mm noncalcified nodule in the left lower lobe measures 7 x 4 mm on image number 60/5. The other previously described smaller lung nodules are stable. No new or enlarging nodules seen. No pleural fluid. Upper Abdomen: Stable small liver cysts. Musculoskeletal: Bilateral breast implants remain intact. Mild thoracic and lower cervical spine degenerative changes. No evidence of bony metastatic disease. IMPRESSION: 1. Slight decrease in size of the previously demonstrated 7 x 6 mm left lower lobe lung nodule, now measuring 7 x 4 mm. 2. Stable smaller lung nodules. These can be reassessed with a  chest CT without contrast in 1 year. 3. No new or enlarging nodules. 4. Mild calcific coronary artery and aortic atherosclerosis. Aortic Atherosclerosis (ICD10-I70.0). Electronically Signed   By: Zada Finders.D.  On: 08/06/2023 15:28     Assessment & Plan:   Pulmonary nodules - Stable. Hx breast cancer. CT chest 07/27/23 showed no new or enlarging pulmoanry nodules; decreased size previous left lower lung nodule, now measuring 7-64mm. Stable smaller lung nodules, continue to follow-up in annual CT chest.   Dyspnea - No significant residual dyspnea symptoms since completing chemotherapy   Elevated liver enzymes -Liver enzymes have been mildly elevated over the last 2 to 3 months, only recent change has been addition of Arimidex. Liver ultrasound was normal in August 2024.  Advised patient follow-up with oncology.  Glenford Bayley, NP 08/09/2023

## 2023-08-07 NOTE — Telephone Encounter (Signed)
Inbound call from patient requesting additional information regarding 10/24 procedure she is scheduled for. Please advise, thank you.

## 2023-08-09 DIAGNOSIS — R748 Abnormal levels of other serum enzymes: Secondary | ICD-10-CM | POA: Insufficient documentation

## 2023-08-09 NOTE — Assessment & Plan Note (Signed)
-  Liver enzymes have been mildly elevated over the last 2 to 3 months, only recent change has been addition of Arimidex. Liver ultrasound was normal in August 2024.  Advised patient follow-up with oncology.

## 2023-08-09 NOTE — Assessment & Plan Note (Signed)
-   No significant residual dyspnea symptoms since completing chemotherapy

## 2023-08-09 NOTE — Assessment & Plan Note (Signed)
-   Stable. Hx breast cancer. CT chest 07/27/23 showed no new or enlarging pulmoanry nodules; decreased size previous left lower lung nodule, now measuring 7-41mm. Stable smaller lung nodules, continue to follow-up in annual CT chest.

## 2023-08-10 NOTE — Telephone Encounter (Signed)
Patient requesting to know when she should arrive for her upcoming [facility] repair with Dr Andrey Campanile and cTIF for Dr Barron Alvine. She also would like to know if there are any medications she should stop prior to her appointment.   I advised her that on our end, no need to stop any medications, however, she should reach out to Dr Tawana Scale office to make sure there are no medications they would need her to stop beforehand. Also advised that Dr Tawana Scale office should be able to advise her of when she needs to present for her surgery since Dr Tawana Scale surgery will be first. She verbalizes understanding.  Patient also confirms that she has received TIF aftercare instructions/diet and follow up office visit date with Dr Barron Alvine.

## 2023-08-10 NOTE — Telephone Encounter (Signed)
Left message for patient to call back  

## 2023-08-12 NOTE — Progress Notes (Signed)
Please place any needed orders in epic  for preop. Thank you.

## 2023-08-12 NOTE — Progress Notes (Signed)
PCP - Roger Kill Cardiologist - Norman Herrlich, MD  LOV 05-26-23 preop eval Pulm- LOV 08-07-23 Ames Dura, NP  PPM/ICD -  Device Orders -  Rep Notified -   Chest x-ray - CT chest WO contrast 08-06-23 epic EKG - 05-26-23 epic Stress Test -  ECHO - 07-08-23 epic Cardiac Cath -  CT coronary 06-09-23  Sleep Study -  CPAP -   Fasting Blood Sugar -  Checks Blood Sugar _____ times a day  Blood Thinner Instructions: Aspirin Instructions:  ERAS Protcol - PRE-SURGERY n/a    COVID vaccine -no  Activity--Able to climb stairs without CP or SOB has stairs in home Anesthesia review: non hodgkins lymphoma, breast CA, pulm nodules  Patient denies shortness of breath, fever, cough and chest pain at PAT appointment   All instructions explained to the patient, with a verbal understanding of the material. Patient agrees to go over the instructions while at home for a better understanding. Patient also instructed to self quarantine after being tested for COVID-19. The opportunity to ask questions was provided.

## 2023-08-12 NOTE — Patient Instructions (Signed)
SURGICAL WAITING ROOM VISITATION  Patients having surgery or a procedure may have no more than 2 support people in the waiting area - these visitors may rotate.    Children under the age of 37 must have an adult with them who is not the patient.  Due to an increase in RSV and influenza rates and associated hospitalizations, children ages 66 and under may not visit patients in [facility] hospitals.  If the patient needs to stay at the hospital during part of their recovery, the visitor guidelines for inpatient rooms apply. Pre-op nurse will coordinate an appropriate time for 1 support person to accompany patient in pre-op.  This support person may not rotate.    Please refer to the [facility] website for the visitor guidelines for Inpatients (after your surgery is over and you are in a regular room).       Your procedure is scheduled on: 09-03-23   Report to [facility] Main Entrance    Report to admitting at    0615 AM   Call this number if you have problems the morning of surgery [phone]   Do not eat food   or drink liquids  :After Midnight.                         If you have questions, please contact your surgeon's office.   FOLLOW BOWEL PREP AND ANY ADDITIONAL PRE OP INSTRUCTIONS YOU RECEIVED FROM YOUR SURGEON'S OFFICE!!!     Oral Hygiene is also important to reduce your risk of infection.                                    Remember - BRUSH YOUR TEETH THE MORNING OF SURGERY WITH YOUR REGULAR TOOTHPASTE  DENTURES WILL BE REMOVED PRIOR TO SURGERY PLEASE DO NOT APPLY "Poly grip" OR ADHESIVES!!!   Do NOT smoke after Midnight   Stop all vitamins and herbal supplements 7 days before surgery.   Take these medicines the morning of surgery with A SIP OF WATER: Arimidex, Voquenza, rosuvastatin, claritin, Fluoxetine, bring rescue in haler withyou                                You may not have any metal on your body including hair pins, jewelry, and body  piercing             Do not wear make-up, lotions, powders, perfumes/cologne, or deodorant  Do not wear nail polish including gel and S&S, artificial/acrylic nails, or any other type of covering on natural nails including finger and toenails. If you have artificial nails, gel coating, etc. that needs to be removed by a nail salon please have this removed prior to surgery or surgery may need to be canceled/ delayed if the surgeon/ anesthesia feels like they are unable to be safely monitored.   Do not shave  48 hours prior to surgery.                 Do not bring valuables to the hospital. [facility] IS NOT             RESPONSIBLE   FOR VALUABLES.   Contacts, glasses, dentures or bridgework may not be worn into surgery.   Bring small overnight bag day of surgery.   DO NOT BRING  YOUR HOME MEDICATIONS TO THE HOSPITAL. PHARMACY WILL DISPENSE MEDICATIONS LISTED ON YOUR MEDICATION LIST TO YOU DURING YOUR ADMISSION IN THE HOSPITAL!    Patients discharged on the day of surgery will not be allowed to drive home.  Someone NEEDS to stay with you for the first 24 hours after anesthesia.   Special Instructions: Bring a copy of your healthcare power of attorney and living will documents the day of surgery if you haven't scanned them before.              Please read over the following fact sheets you were given: IF YOU HAVE QUESTIONS ABOUT YOUR PRE-OP INSTRUCTIONS PLEASE CALL [phone]    If you test positive for Covid or have been in contact with anyone that has tested positive in the last 10 days please notify you surgeon.    [facility] - Preparing for Surgery Before surgery, you can play an important role.  Because skin is not sterile, your skin needs to be as free of germs as possible.  You can reduce the number of germs on your skin by washing with CHG (chlorahexidine gluconate) soap before surgery.  CHG is an antiseptic cleaner which kills germs and bonds with the skin to continue killing  germs even after washing. Please DO NOT use if you have an allergy to CHG or antibacterial soaps.  If your skin becomes reddened/irritated stop using the CHG and inform your nurse when you arrive at Short Stay. Do not shave (including legs and underarms) for at least 48 hours prior to the first CHG shower.  You may shave your face/neck. Please follow these instructions carefully:  1.  Shower with CHG Soap the night before surgery and the  morning of Surgery.  2.  If you choose to wash your hair, wash your hair first as usual with your  normal  shampoo.  3.  After you shampoo, rinse your hair and body thoroughly to remove the  shampoo.                           4.  Use CHG as you would any other liquid soap.  You can apply chg directly  to the skin and wash                       Gently with a scrungie or clean washcloth.  5.  Apply the CHG Soap to your body ONLY FROM THE NECK DOWN.   Do not use on face/ open                           Wound or open sores. Avoid contact with eyes, ears mouth and genitals (private parts).                       Wash face,  Genitals (private parts) with your normal soap.             6.  Wash thoroughly, paying special attention to the area where your surgery  will be performed.  7.  Thoroughly rinse your body with warm water from the neck down.  8.  DO NOT shower/wash with your normal soap after using and rinsing off  the CHG Soap.                9.  Pat yourself dry with a clean towel.  10.  Wear clean pajamas.            11.  Place clean sheets on your bed the night of your first shower and do not  sleep with pets. Day of Surgery : Do not apply any lotions/deodorants the morning of surgery.  Please wear clean clothes to the hospital/surgery center.  FAILURE TO FOLLOW THESE INSTRUCTIONS MAY RESULT IN THE CANCELLATION OF YOUR SURGERY PATIENT SIGNATURE_________________________________  NURSE  SIGNATURE__________________________________  ________________________________________________________________________

## 2023-08-13 ENCOUNTER — Encounter (HOSPITAL_COMMUNITY): Payer: Self-pay

## 2023-08-13 ENCOUNTER — Encounter (HOSPITAL_COMMUNITY)
Admission: RE | Admit: 2023-08-13 | Discharge: 2023-08-13 | Disposition: A | Discharge status: Home / Self Care | Payer: BC Managed Care – PPO | Source: Ambulatory Visit | Attending: General Surgery | Admitting: General Surgery

## 2023-08-13 ENCOUNTER — Other Ambulatory Visit: Payer: Self-pay

## 2023-08-13 VITALS — BP 151/75 | HR 91 | Temp 98.3°F | Resp 16 | Ht 63.0 in | Wt 176.0 lb

## 2023-08-13 DIAGNOSIS — Z01818 Encounter for other preprocedural examination: Secondary | ICD-10-CM | POA: Insufficient documentation

## 2023-08-13 LAB — CBC
HCT: 45.6 % (ref 36.0–46.0)
Hemoglobin: 14 g/dL (ref 12.0–15.0)
MCH: 29.9 pg (ref 26.0–34.0)
MCHC: 30.7 g/dL (ref 30.0–36.0)
MCV: 97.2 fL (ref 80.0–100.0)
Platelets: 355 10*3/uL (ref 150–400)
RBC: 4.69 MIL/uL (ref 3.87–5.11)
RDW: 13.8 % (ref 11.5–15.5)
WBC: 7.1 10*3/uL (ref 4.0–10.5)
nRBC: 0 % (ref 0.0–0.2)

## 2023-08-31 ENCOUNTER — Ambulatory Visit: Payer: BC Managed Care – PPO | Admitting: Cardiology

## 2023-09-01 NOTE — Anesthesia Preprocedure Evaluation (Signed)
Anesthesia Evaluation  Patient identified by MRN, date of birth, ID band Patient awake    Reviewed: Allergy & Precautions, Patient's Chart, lab work & pertinent test results  History of Anesthesia Complications Negative for: history of anesthetic complications  Airway Mallampati: II  TM Distance: >3 FB Neck ROM: Full    Dental  (+) Missing,    Pulmonary former smoker   Pulmonary exam normal        Cardiovascular negative cardio ROS Normal cardiovascular exam     Neuro/Psych   Anxiety Depression    RLS    GI/Hepatic Neg liver ROS, hiatal hernia,GERD  Controlled,,  Endo/Other  negative endocrine ROS    Renal/GU negative Renal ROS     Musculoskeletal negative musculoskeletal ROS (+)    Abdominal   Peds  Hematology negative hematology ROS (+)   Anesthesia Other Findings Day of surgery medications reviewed with patient.  Reproductive/Obstetrics                             Anesthesia Physical Anesthesia Plan  ASA: 2  Anesthesia Plan: General   Post-op Pain Management: Tylenol PO (pre-op)*   Induction: Intravenous  PONV Risk Score and Plan: 3 and Treatment may vary due to age or medical condition, Midazolam, Dexamethasone, Ondansetron, Propofol infusion and Scopolamine patch - Pre-op  Airway Management Planned: Oral ETT  Additional Equipment: None  Intra-op Plan:   Post-operative Plan: Extubation in OR  Informed Consent: I have reviewed the patients History and Physical, chart, labs and discussed the procedure including the risks, benefits and alternatives for the proposed anesthesia with the patient or authorized representative who has indicated his/her understanding and acceptance.     Dental advisory given  Plan Discussed with: CRNA  Anesthesia Plan Comments: (See PAT note from 10/3 by Sherlie Ban PA-C )         Anesthesia Quick Evaluation

## 2023-09-01 NOTE — Progress Notes (Signed)
DISCUSSION: Erika Hodge is a 57 year old female who presents to PAT prior to LAPAROSCOPIC REPAIR OF HIATAL HERNIA on 09/03/23 with Dr. Andrey Campanile. PMH of former smoking, history of breast cancer s/p bilateral mastectomies (12/2021), non-Hodgkin's lymphoma s/p radiation, GERD, hiatal hernia, anemia, anxiety, depression.  Prior complication from anesthesia includes corneal abrasion after anesthesia due to incomplete eye closure.  Patient follows with oncology for history of breast cancer.  She is status post definitive surgery in 2023. Last seen in clinic on 07/03/23. She has had difficulty tolerating anti-estrogen medications due to side effects.  Patient follows with pulmonology for history of bronchiectasis and pulmonary nodules. Last seen on 9/278/24. It was noted her SOB has improved after finishing chemo. She uses albuterol prn. Follow up CT chest 07/27/23 showed decreased size previous left lower lung nodule, now measuring 7-67mm. Stable smaller lung nodules, continue to follow-up in annual CT chest. No new or enlarging pulmonary nodules. Stable small liver cysts. No evidence of bone metastatic disease.   Patient was evaluated by cardiology on 05/26/2023 for preop exam due to reports of chest pain.  She had a CT a of the coronaries which showed minimal nonobstructive CAD.  Echo was done on 07/08/2023 which was normal but technically difficult study. Per Dr. Dulce Sellar:  "07/10/2023: Update echocardiogram was normal ejection fraction and no concerning valvular abnormality She has updated and optimized for planned esophageal surgery.   Her coronary calcium score is low less than 10 and she has minimal coronary atherosclerosis no valvular calcification in my opinion is at low risk and optimized for planned elective esophageal surgery."  VS: BP (!) 151/75   Pulse 91   Temp 36.8 C (Oral)   Resp 16   Ht 5\' 3"  (1.6 m)   Wt 79.8 kg   SpO2 98%   BMI 31.18 kg/m   PROVIDERS: Galvin Proffer,  MD Cardiologist - Norman Herrlich, MD  LOV 05-26-23 preop eval Pulm- LOV 08-07-23 Ames Dura, NP  LABS: Labs reviewed: Acceptable for surgery. (all labs ordered are listed, but only abnormal results are displayed)  Labs Reviewed  CBC     IMAGES:  CT Chest w/o contrast 07/27/23:  IMPRESSION: 1. Slight decrease in size of the previously demonstrated 7 x 6 mm left lower lobe lung nodule, now measuring 7 x 4 mm. 2. Stable smaller lung nodules. These can be reassessed with a chest CT without contrast in 1 year. 3. No new or enlarging nodules. 4. Mild calcific coronary artery and aortic atherosclerosis.   Aortic Atherosclerosis (ICD10-I70.0).   EKG:   CV:  Echo 07/08/23:  IMPRESSIONS     1. Technically difficult study.   2. Left ventricular ejection fraction, by estimation, is 55 to 60%. The  left ventricle has normal function. The left ventricle has no regional  wall motion abnormalities. Left ventricular diastolic parameters are  normal.   3. Right ventricular systolic function is normal. The right ventricular  size is normal. There is normal pulmonary artery systolic pressure.   4. The mitral valve is grossly normal. Mild mitral valve regurgitation.  No evidence of mitral stenosis.   5. The aortic valve is tricuspid. Aortic valve regurgitation is mild to  moderate. Aortic valve sclerosis/calcification is present, without any  evidence of aortic stenosis.   6. The inferior vena cava is normal in size with <50% respiratory  variability, suggesting right atrial pressure of 8 mmHg.   CTA coronary 06/02/2023:  IMPRESSION: 1.  Minimal nonobstructive CAD, CADRADS = 1.  2. Coronary calcium score of 9. This was 81st percentile for age-, sex-, and race- matched controls.   3. Total plaque volume 36 mm3 which is 58th percentile for age- and sex- matched controls (calcified plaque 1 mm3; noncalcified plaque 35 mm3). Total plaque volume is mild.   4. Normal coronary  origin with left dominance, with smaller nondominant rPDA as well.   INTERPRETATION:   CAD-RADS 1: Minimal non-obstructive CAD (1-24%). Consider non-atherosclerotic causes of chest pain. Consider preventive therapy and risk factor modification.    Comparison(s): No prior Echocardiogram.  Past Medical History:  Diagnosis Date   Allergic rhinitis 12/02/2021   Anemia    Anemia of chronic disease 04/08/2022   Anxiety    Bacterial vaginosis 05/25/2023   Bronchiectasis without complication (HCC) 04/07/2022   Cancer (HCC)    Chest pain    Complication of anesthesia    HA  One eye dosent close during and is dry and gets scratched has been to eye doctor 2 times after anesthesia for this   Depression    Elevated cholesterol    Family history of breast cancer 11/29/2021   Gastro-esophageal reflux disease without esophagitis 12/02/2021   Genetic testing 12/11/2021   [facility] was Negative. Report date is 12/11/2021.     The CancerNext gene panel offered by 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   Menorrhagia 05/25/2023   Neutropenic fever (HCC) 04/07/2022   Non Hodgkin's lymphoma (HCC)    Obesity (BMI 30-39.9) 04/08/2022   Psoriasis    Pulmonary nodules 04/07/2022   Restless leg syndrome 04/08/2022   Vitamin D deficiency 05/25/2023    Past Surgical History:   Procedure Laterality Date   BREAST BIOPSY Left 11/20/2021   BREAST RECONSTRUCTION WITH PLACEMENT OF TISSUE EXPANDER AND ALLODERM Bilateral 01/07/2022   Procedure: BILATERAL BREAST RECONSTRUCTION WITH PLACEMENT OF TISSUE EXPANDERS AND ALLODERM;  Surgeon: Glenna Fellows, MD;  Location: [facility];  Service: Plastics;  Laterality: Bilateral;   COLONOSCOPY  04/07/2018   [facility]. Dr. Charna Elizabeth. Four small polyps, 2 in the sigmoid colon and 2 in teh proximal ascending colon - removed by cold biopsies. One 7 mm sessile polyps in the mid sigmoid colon, removed with a hot snare x 1; resected and retrived. The examined portion of the ileum was normal. The ecamination was otherwise normal on direct and retroflexion views.   ESOPHAGOGASTRODUODENOSCOPY  11/19/2015   [facility]. Dr. Charna Elizabeth. LA Grade C reflux esophagitis. Esophageal mucosal changes suggestive of Eosinophilic esophagitis noted in the distal 2/3 of the esophagus-biopsies done. Medium-sized hiatal hernia noted on retroplexion. Moderate diffuse gastritis. Normal examined duodenum.   ESOPHAGOGASTRODUODENOSCOPY  02/12/2023   [facility]. Dr. Jeani Hawking.  LA Grade D reflux esophagitis with no bleeding. 3 cm hiatal hernia. Gastrophageal flap valve classified as Hill Grade II ( fold present, opens with respiration). Normal stomach. Normal examined  duodenum. No specimens collected.   NIPPLE SPARING MASTECTOMY Right 01/07/2022   Procedure: RIGHT NIPPLE SPARING MASTECTOMY;  Surgeon: Harriette Bouillon, MD;  Location: [facility];  Service: General;  Laterality: Right;   NIPPLE SPARING MASTECTOMY WITH SENTINEL LYMPH NODE BIOPSY Left 01/07/2022   Procedure: LEFT NIPPLE SPARING MASTECTOMY WITH SENTINEL LYMPH NODE BIOPSY;  Surgeon: Harriette Bouillon, MD;  Location: [facility];  Service: General;  Laterality: Left;   REMOVAL OF BILATERAL TISSUE EXPANDERS WITH PLACEMENT  OF BILATERAL BREAST IMPLANTS Bilateral 07/15/2022   Procedure: REMOVAL OF BILATERAL TISSUE EXPANDERS WITH PLACEMENT OF BILATERAL BREAST IMPLANTS;  Surgeon: Glenna Fellows, MD;  Location: [facility];  Service: Plastics;  Laterality: Bilateral;   TUBAL LIGATION      MEDICATIONS:  albuterol (VENTOLIN HFA) 108 (90 Base) MCG/ACT inhaler   anastrozole (ARIMIDEX) 1 MG tablet   BIOTIN PO   FIBER PO   FLUoxetine (PROZAC) 20 MG capsule   fluticasone (FLONASE) 50 MCG/ACT nasal spray   gabapentin (NEURONTIN) 100 MG capsule   lisdexamfetamine (VYVANSE) 40 MG capsule   loratadine-pseudoephedrine (CLARITIN-D 24-HOUR) 10-240 MG 24 hr tablet   Prenatal Vit-Fe Fumarate-FA (PRENATAL MULTIVITAMIN) TABS tablet   rOPINIRole (REQUIP) 0.25 MG tablet   rosuvastatin (CRESTOR) 5 MG tablet   Tapinarof (VTAMA) 1 % CREA   TURMERIC PO   Vonoprazan Fumarate (VOQUEZNA) 20 MG TABS   zolpidem (AMBIEN) 10 MG tablet   No current facility-administered medications for this encounter.   Marcille Blanco [facility] Surgical Short Stay/Anesthesiology [facility] Phone [phone] 09/01/2023 8:55 AM

## 2023-09-03 ENCOUNTER — Encounter (HOSPITAL_COMMUNITY): Payer: Self-pay | Admitting: General Surgery

## 2023-09-03 ENCOUNTER — Encounter: Payer: Self-pay | Admitting: *Deleted

## 2023-09-03 ENCOUNTER — Other Ambulatory Visit: Payer: Self-pay

## 2023-09-03 ENCOUNTER — Ambulatory Visit (HOSPITAL_COMMUNITY): Payer: Self-pay | Admitting: Medical

## 2023-09-03 ENCOUNTER — Encounter (HOSPITAL_COMMUNITY)
Admission: RE | Disposition: A | Discharge status: Home / Self Care | Payer: Self-pay | Source: Home / Self Care | Attending: Internal Medicine

## 2023-09-03 ENCOUNTER — Ambulatory Visit (HOSPITAL_COMMUNITY): Payer: BC Managed Care – PPO | Admitting: Medical

## 2023-09-03 ENCOUNTER — Observation Stay (HOSPITAL_COMMUNITY)
Admission: RE | Admit: 2023-09-03 | Discharge: 2023-09-04 | Disposition: A | Discharge status: Home / Self Care | Payer: BC Managed Care – PPO | Attending: Internal Medicine | Admitting: Internal Medicine

## 2023-09-03 DIAGNOSIS — K449 Diaphragmatic hernia without obstruction or gangrene: Secondary | ICD-10-CM | POA: Diagnosis present

## 2023-09-03 DIAGNOSIS — K317 Polyp of stomach and duodenum: Secondary | ICD-10-CM | POA: Diagnosis not present

## 2023-09-03 DIAGNOSIS — K219 Gastro-esophageal reflux disease without esophagitis: Secondary | ICD-10-CM | POA: Diagnosis not present

## 2023-09-03 DIAGNOSIS — Z8572 Personal history of non-Hodgkin lymphomas: Secondary | ICD-10-CM | POA: Insufficient documentation

## 2023-09-03 DIAGNOSIS — Z853 Personal history of malignant neoplasm of breast: Secondary | ICD-10-CM | POA: Diagnosis not present

## 2023-09-03 DIAGNOSIS — Z87891 Personal history of nicotine dependence: Secondary | ICD-10-CM | POA: Insufficient documentation

## 2023-09-03 DIAGNOSIS — Z79899 Other long term (current) drug therapy: Secondary | ICD-10-CM | POA: Diagnosis not present

## 2023-09-03 DIAGNOSIS — K2100 Gastro-esophageal reflux disease with esophagitis, without bleeding: Principal | ICD-10-CM

## 2023-09-03 HISTORY — PX: HIATAL HERNIA REPAIR: SHX195

## 2023-09-03 HISTORY — PX: TRANSORAL INCISIONLESS FUNDOPLICATION: SHX6840

## 2023-09-03 HISTORY — PX: SAVORY DILATION: SHX5439

## 2023-09-03 HISTORY — PX: ESOPHAGOGASTRODUODENOSCOPY: SHX5428

## 2023-09-03 SURGERY — REPAIR, HERNIA, HIATAL, LAPAROSCOPIC
Anesthesia: General

## 2023-09-03 MED ORDER — ACETAMINOPHEN 10 MG/ML IV SOLN: 1000.0000 mg | Freq: Once | INTRAVENOUS | Status: DC | PRN

## 2023-09-03 MED ORDER — DEXAMETHASONE SODIUM PHOSPHATE 10 MG/ML IJ SOLN
INTRAMUSCULAR | Status: DC | PRN
  Administered 2023-09-03: 8 mg via INTRAVENOUS

## 2023-09-03 MED ORDER — HYDROMORPHONE HCL 1 MG/ML IJ SOLN
1.0000 mg | INTRAMUSCULAR | Status: DC | PRN
  Administered 2023-09-03: 1 mg via INTRAVENOUS
  Filled 2023-09-03: qty 1

## 2023-09-03 MED ORDER — OXYCODONE HCL 5 MG PO TABS
5.0000 mg | ORAL_TABLET | ORAL | Status: DC | PRN
Start: 2023-09-03 — End: 2023-09-04
  Administered 2023-09-03 – 2023-09-04 (×5): 5 mg via ORAL
  Filled 2023-09-03 (×5): qty 1

## 2023-09-03 MED ORDER — SODIUM CHLORIDE 0.9 % IV SOLN: INTRAVENOUS | Status: DC

## 2023-09-03 MED ORDER — ZOLPIDEM TARTRATE 5 MG PO TABS
5.0000 mg | ORAL_TABLET | Freq: Every evening | ORAL | Status: DC | PRN
  Administered 2023-09-03: 5 mg via ORAL
  Filled 2023-09-03: qty 1

## 2023-09-03 MED ORDER — ONDANSETRON HCL 4 MG PO TABS: 4.0000 mg | ORAL_TABLET | Freq: Four times a day (QID) | ORAL | Status: DC | PRN

## 2023-09-03 MED ORDER — PROPOFOL 500 MG/50ML IV EMUL
INTRAVENOUS | Status: DC | PRN
  Administered 2023-09-03: 50 ug/kg/min via INTRAVENOUS

## 2023-09-03 MED ORDER — LACTATED RINGERS IV SOLN
INTRAVENOUS | Status: DC
Start: 2023-09-03 — End: 2023-09-03

## 2023-09-03 MED ORDER — PANTOPRAZOLE SODIUM 40 MG PO TBEC
40.0000 mg | DELAYED_RELEASE_TABLET | Freq: Two times a day (BID) | ORAL | Status: DC
  Administered 2023-09-03 – 2023-09-04 (×2): 40 mg via ORAL
  Filled 2023-09-03 (×2): qty 1

## 2023-09-03 MED ORDER — ACETAMINOPHEN 650 MG RE SUPP: 650.0000 mg | Freq: Four times a day (QID) | RECTAL | Status: DC | PRN

## 2023-09-03 MED ORDER — HYDROMORPHONE HCL 1 MG/ML IJ SOLN
0.2500 mg | INTRAMUSCULAR | Status: DC | PRN
  Administered 2023-09-03: 0.5 mg via INTRAVENOUS

## 2023-09-03 MED ORDER — ACETAMINOPHEN 10 MG/ML IV SOLN
INTRAVENOUS | Status: AC
  Filled 2023-09-03: qty 100

## 2023-09-03 MED ORDER — ALBUTEROL SULFATE (2.5 MG/3ML) 0.083% IN NEBU: 2.5000 mg | INHALATION_SOLUTION | RESPIRATORY_TRACT | Status: DC | PRN

## 2023-09-03 MED ORDER — HYDROMORPHONE HCL 1 MG/ML IJ SOLN
INTRAMUSCULAR | Status: AC
  Filled 2023-09-03: qty 1

## 2023-09-03 MED ORDER — DEXAMETHASONE SODIUM PHOSPHATE 10 MG/ML IJ SOLN
8.0000 mg | Freq: Four times a day (QID) | INTRAMUSCULAR | Status: DC
  Administered 2023-09-03 – 2023-09-04 (×4): 8 mg via INTRAVENOUS
  Filled 2023-09-03 (×4): qty 1

## 2023-09-03 MED ORDER — GABAPENTIN 100 MG PO CAPS
100.0000 mg | ORAL_CAPSULE | Freq: Every day | ORAL | Status: DC
  Administered 2023-09-03: 100 mg via ORAL
  Filled 2023-09-03: qty 1

## 2023-09-03 MED ORDER — ONDANSETRON HCL 4 MG/2ML IJ SOLN
4.0000 mg | Freq: Four times a day (QID) | INTRAMUSCULAR | Status: DC | PRN
  Filled 2023-09-03: qty 2

## 2023-09-03 MED ORDER — BUPIVACAINE LIPOSOME 1.3 % IJ SUSP
INTRAMUSCULAR | Status: AC
  Filled 2023-09-03: qty 20

## 2023-09-03 MED ORDER — FLUOXETINE HCL 20 MG PO CAPS
20.0000 mg | ORAL_CAPSULE | Freq: Every day | ORAL | Status: DC
  Administered 2023-09-04: 20 mg via ORAL
  Filled 2023-09-03: qty 1

## 2023-09-03 MED ORDER — OXYCODONE HCL 5 MG PO TABS
ORAL_TABLET | ORAL | Status: AC
  Filled 2023-09-03: qty 1

## 2023-09-03 MED ORDER — ONDANSETRON HCL 4 MG/2ML IJ SOLN
INTRAMUSCULAR | Status: AC
  Filled 2023-09-03: qty 2

## 2023-09-03 MED ORDER — HYDROMORPHONE HCL 1 MG/ML IJ SOLN
0.5000 mg | Freq: Once | INTRAMUSCULAR | Status: AC
  Administered 2023-09-03: 0.5 mg via INTRAVENOUS

## 2023-09-03 MED ORDER — ANASTROZOLE 1 MG PO TABS
1.0000 mg | ORAL_TABLET | Freq: Every day | ORAL | Status: DC
  Filled 2023-09-03: qty 1

## 2023-09-03 MED ORDER — DROPERIDOL 2.5 MG/ML IJ SOLN: 0.6250 mg | Freq: Once | INTRAMUSCULAR | Status: DC | PRN

## 2023-09-03 MED ORDER — ROPINIROLE HCL 0.5 MG PO TABS
0.5000 mg | ORAL_TABLET | Freq: Every day | ORAL | Status: DC
  Administered 2023-09-03: 0.5 mg via ORAL
  Filled 2023-09-03: qty 1

## 2023-09-03 MED ORDER — PROPOFOL 10 MG/ML IV BOLUS
INTRAVENOUS | Status: AC
  Filled 2023-09-03: qty 20

## 2023-09-03 MED ORDER — DEXMEDETOMIDINE HCL IN NACL 80 MCG/20ML IV SOLN
INTRAVENOUS | Status: DC | PRN
  Administered 2023-09-03 (×2): 10 ug via INTRAVENOUS

## 2023-09-03 MED ORDER — DEXAMETHASONE SODIUM PHOSPHATE 10 MG/ML IJ SOLN
INTRAMUSCULAR | Status: AC
  Filled 2023-09-03: qty 1

## 2023-09-03 MED ORDER — LIDOCAINE HCL (CARDIAC) PF 100 MG/5ML IV SOSY
PREFILLED_SYRINGE | INTRAVENOUS | Status: DC | PRN
  Administered 2023-09-03: 100 mg via INTRAVENOUS

## 2023-09-03 MED ORDER — MIDAZOLAM HCL 2 MG/2ML IJ SOLN
INTRAMUSCULAR | Status: AC
  Filled 2023-09-03: qty 2

## 2023-09-03 MED ORDER — ONDANSETRON HCL 4 MG/2ML IJ SOLN
INTRAMUSCULAR | Status: DC | PRN
  Administered 2023-09-03: 4 mg via INTRAVENOUS

## 2023-09-03 MED ORDER — SUGAMMADEX SODIUM 200 MG/2ML IV SOLN
INTRAVENOUS | Status: DC | PRN
  Administered 2023-09-03: 200 mg via INTRAVENOUS

## 2023-09-03 MED ORDER — FENTANYL CITRATE (PF) 100 MCG/2ML IJ SOLN
INTRAMUSCULAR | Status: AC
  Filled 2023-09-03: qty 2

## 2023-09-03 MED ORDER — ACETAMINOPHEN 325 MG PO TABS
650.0000 mg | ORAL_TABLET | Freq: Four times a day (QID) | ORAL | Status: DC | PRN
  Administered 2023-09-04: 650 mg via ORAL
  Filled 2023-09-03: qty 2

## 2023-09-03 MED ORDER — ONDANSETRON HCL 4 MG/2ML IJ SOLN
4.0000 mg | Freq: Once | INTRAMUSCULAR | Status: AC
  Administered 2023-09-03: 4 mg via INTRAVENOUS
  Filled 2023-09-03: qty 2

## 2023-09-03 MED ORDER — CHLORHEXIDINE GLUCONATE 0.12 % MT SOLN
15.0000 mL | Freq: Once | OROMUCOSAL | Status: AC
  Administered 2023-09-03: 15 mL via OROMUCOSAL

## 2023-09-03 MED ORDER — METOCLOPRAMIDE HCL 5 MG/ML IJ SOLN
10.0000 mg | Freq: Four times a day (QID) | INTRAMUSCULAR | Status: DC
  Administered 2023-09-03 – 2023-09-04 (×4): 10 mg via INTRAVENOUS
  Filled 2023-09-03 (×4): qty 2

## 2023-09-03 MED ORDER — SIMETHICONE 80 MG PO CHEW
80.0000 mg | CHEWABLE_TABLET | Freq: Four times a day (QID) | ORAL | Status: DC | PRN
Start: 2023-09-03 — End: 2023-09-04
  Administered 2023-09-03 – 2023-09-04 (×2): 80 mg via ORAL
  Filled 2023-09-03 (×2): qty 1

## 2023-09-03 MED ORDER — MIDAZOLAM HCL 5 MG/5ML IJ SOLN
INTRAMUSCULAR | Status: DC | PRN
  Administered 2023-09-03: 2 mg via INTRAVENOUS

## 2023-09-03 MED ORDER — FAMOTIDINE IN NACL 20-0.9 MG/50ML-% IV SOLN
20.0000 mg | Freq: Once | INTRAVENOUS | Status: AC
  Administered 2023-09-03: 20 mg via INTRAVENOUS
  Filled 2023-09-03: qty 50

## 2023-09-03 MED ORDER — ONDANSETRON HCL 4 MG/2ML IJ SOLN
4.0000 mg | Freq: Four times a day (QID) | INTRAMUSCULAR | Status: DC
  Administered 2023-09-03 – 2023-09-04 (×4): 4 mg via INTRAVENOUS
  Filled 2023-09-03 (×4): qty 2

## 2023-09-03 MED ORDER — LIDOCAINE VISCOUS HCL 2 % MT SOLN: 5.0000 mL | Freq: Three times a day (TID) | OROMUCOSAL | Status: DC | PRN

## 2023-09-03 MED ORDER — 0.9 % SODIUM CHLORIDE (POUR BTL) OPTIME
TOPICAL | Status: DC | PRN
  Administered 2023-09-03: 500 mL

## 2023-09-03 MED ORDER — ROSUVASTATIN CALCIUM 5 MG PO TABS
5.0000 mg | ORAL_TABLET | Freq: Every day | ORAL | Status: DC
  Administered 2023-09-04: 5 mg via ORAL
  Filled 2023-09-03: qty 1

## 2023-09-03 MED ORDER — CEFAZOLIN SODIUM-DEXTROSE 2-4 GM/100ML-% IV SOLN
2.0000 g | Freq: Once | INTRAVENOUS | Status: DC
  Filled 2023-09-03: qty 100

## 2023-09-03 MED ORDER — ROCURONIUM BROMIDE 100 MG/10ML IV SOLN
INTRAVENOUS | Status: DC | PRN
  Administered 2023-09-03: 60 mg via INTRAVENOUS
  Administered 2023-09-03: 20 mg via INTRAVENOUS

## 2023-09-03 MED ORDER — PROPOFOL 10 MG/ML IV BOLUS
INTRAVENOUS | Status: DC | PRN
  Administered 2023-09-03: 150 mg via INTRAVENOUS
  Administered 2023-09-03: 50 mg via INTRAVENOUS

## 2023-09-03 MED ORDER — SCOPOLAMINE 1 MG/3DAYS TD PT72
1.0000 | MEDICATED_PATCH | Freq: Once | TRANSDERMAL | Status: DC
  Administered 2023-09-03: 1.5 mg via TRANSDERMAL
  Filled 2023-09-03: qty 1

## 2023-09-03 MED ORDER — BUPIVACAINE-EPINEPHRINE 0.25% -1:200000 IJ SOLN
INTRAMUSCULAR | Status: AC
  Filled 2023-09-03: qty 1

## 2023-09-03 MED ORDER — ORAL CARE MOUTH RINSE: 15.0000 mL | Freq: Once | OROMUCOSAL | Status: AC

## 2023-09-03 MED ORDER — SCOPOLAMINE 1 MG/3DAYS TD PT72: 1.0000 | MEDICATED_PATCH | Freq: Once | TRANSDERMAL | Status: DC

## 2023-09-03 MED ORDER — CLINDAMYCIN PHOSPHATE 900 MG/50ML IV SOLN
900.0000 mg | Freq: Once | INTRAVENOUS | Status: AC
  Administered 2023-09-03: 900 mg via INTRAVENOUS
  Filled 2023-09-03: qty 50

## 2023-09-03 MED ORDER — FENTANYL CITRATE (PF) 100 MCG/2ML IJ SOLN
INTRAMUSCULAR | Status: DC | PRN
  Administered 2023-09-03 (×4): 50 ug via INTRAVENOUS

## 2023-09-03 MED ORDER — ROCURONIUM BROMIDE 10 MG/ML (PF) SYRINGE
PREFILLED_SYRINGE | INTRAVENOUS | Status: AC
Start: 2023-09-03 — End: ?
  Filled 2023-09-03: qty 10

## 2023-09-03 MED ORDER — ACETAMINOPHEN 500 MG PO TABS
1000.0000 mg | ORAL_TABLET | ORAL | Status: AC
  Administered 2023-09-03: 1000 mg via ORAL
  Filled 2023-09-03: qty 2

## 2023-09-03 MED ORDER — LIDOCAINE HCL (PF) 2 % IJ SOLN
INTRAMUSCULAR | Status: AC
Start: 2023-09-03 — End: ?
  Filled 2023-09-03: qty 5

## 2023-09-03 MED ORDER — BUPIVACAINE-EPINEPHRINE (PF) 0.25% -1:200000 IJ SOLN
INTRAMUSCULAR | Status: DC | PRN
  Administered 2023-09-03: 50 mL

## 2023-09-03 MED ORDER — PHENYLEPHRINE HCL (PRESSORS) 10 MG/ML IV SOLN
INTRAVENOUS | Status: DC | PRN
  Administered 2023-09-03: 80 ug via INTRAVENOUS

## 2023-09-03 SURGICAL SUPPLY — 58 items
ANTIFOG SOL W/FOAM PAD STRL (MISCELLANEOUS) ×2
APL PRP STRL LF DISP 70% ISPRP (MISCELLANEOUS) ×2
APPLIER CLIP ROT 10 11.4 M/L (STAPLE)
APR CLP MED LRG 11.4X10 (STAPLE)
BAG COUNTER SPONGE SURGICOUNT (BAG) IMPLANT
BAG SPNG CNTER NS LX DISP (BAG)
BLADE SURG SZ11 CARB STEEL (BLADE) ×2 IMPLANT
CABLE HIGH FREQUENCY MONO STRZ (ELECTRODE) IMPLANT
CHLORAPREP W/TINT 26 (MISCELLANEOUS) ×2 IMPLANT
CLIP APPLIE ROT 10 11.4 M/L (STAPLE) IMPLANT
DEVICE SUT QUICK LOAD TK 5 (SUTURE) ×2 IMPLANT
DEVICE SUT TI-KNOT TK 5X26 (SUTURE) ×2 IMPLANT
DEVICE SUTURE ENDOST 10MM (ENDOMECHANICALS) ×2 IMPLANT
DISSECTOR BLUNT TIP ENDO 5MM (MISCELLANEOUS) ×2 IMPLANT
DRAIN PENROSE 0.5X18 (DRAIN) IMPLANT
DRAPE UTILITY XL STRL (DRAPES) ×4 IMPLANT
DRSG TEGADERM 2-3/8X2-3/4 SM (GAUZE/BANDAGES/DRESSINGS) IMPLANT
ELECT REM PT RETURN 15FT ADLT (MISCELLANEOUS) ×2 IMPLANT
GAUZE 4X4 16PLY ~~LOC~~+RFID DBL (SPONGE) ×2 IMPLANT
GAUZE SPONGE 2X2 8PLY STRL LF (GAUZE/BANDAGES/DRESSINGS) IMPLANT
GLOVE BIO SURGEON STRL SZ7.5 (GLOVE) ×2 IMPLANT
GLOVE INDICATOR 8.0 STRL GRN (GLOVE) ×2 IMPLANT
GOWN STRL REUS W/ TWL XL LVL3 (GOWN DISPOSABLE) ×2 IMPLANT
GOWN STRL REUS W/TWL XL LVL3 (GOWN DISPOSABLE) ×2
GRASPER SUT TROCAR 14GX15 (MISCELLANEOUS) IMPLANT
IRRIG SUCT STRYKERFLOW 2 WTIP (MISCELLANEOUS)
IRRIGATION SUCT STRKRFLW 2 WTP (MISCELLANEOUS) IMPLANT
KIT BASIN OR (CUSTOM PROCEDURE TRAY) ×2 IMPLANT
KIT ESOPHYX Z+ (Miscellaneous) IMPLANT
KIT TURNOVER KIT A (KITS) IMPLANT
NDL HYPO 22X1.5 SAFETY MO (MISCELLANEOUS) ×2 IMPLANT
NEEDLE HYPO 22X1.5 SAFETY MO (MISCELLANEOUS) ×2
NS IRRIG 1000ML POUR BTL (IV SOLUTION) ×2 IMPLANT
PACK UNIVERSAL I (CUSTOM PROCEDURE TRAY) IMPLANT
SCISSORS LAP 5X45 EPIX DISP (ENDOMECHANICALS) ×2 IMPLANT
SET TUBE SMOKE EVAC HIGH FLOW (TUBING) ×2 IMPLANT
SHEARS HARMONIC 45 ACE (MISCELLANEOUS) ×2 IMPLANT
SLEEVE ADV FIXATION 12X100MM (TROCAR) IMPLANT
SLEEVE ADV FIXATION 5X100MM (TROCAR) ×4 IMPLANT
SOLUTION ANTFG W/FOAM PAD STRL (MISCELLANEOUS) ×2 IMPLANT
SPIKE FLUID TRANSFER (MISCELLANEOUS) ×2 IMPLANT
STRIP CLOSURE SKIN 1/2X4 (GAUZE/BANDAGES/DRESSINGS) IMPLANT
SUT ETHIBOND 2 0 SH (SUTURE)
SUT ETHIBOND 2 0 SH 36X2 (SUTURE) IMPLANT
SUT MNCRL AB 4-0 PS2 18 (SUTURE) ×2 IMPLANT
SUT SURGIDAC NAB ES-9 0 48 120 (SUTURE) ×2 IMPLANT
SUT VICRYL 0 UR6 27IN ABS (SUTURE) ×2 IMPLANT
SYR 20ML ECCENTRIC (SYRINGE) ×2 IMPLANT
SYR 20ML LL LF (SYRINGE) ×2 IMPLANT
TIP INNERVISION DETACH 40FR (MISCELLANEOUS) IMPLANT
TIP INNERVISION DETACH 50FR (MISCELLANEOUS) IMPLANT
TIP INNERVISION DETACH 56FR (MISCELLANEOUS) IMPLANT
TOWEL OR 17X26 10 PK STRL BLUE (TOWEL DISPOSABLE) ×2 IMPLANT
TOWEL OR NON WOVEN STRL DISP B (DISPOSABLE) ×2 IMPLANT
TRAY FOLEY MTR SLVR 16FR STAT (SET/KITS/TRAYS/PACK) ×2 IMPLANT
TROCAR ADV FIXATION 12X100MM (TROCAR) ×2 IMPLANT
TROCAR ADV FIXATION 5X100MM (TROCAR) ×2 IMPLANT
TROCAR XCEL NON-BLD 5MMX100MML (ENDOMECHANICALS) ×2 IMPLANT

## 2023-09-03 NOTE — Interval H&P Note (Signed)
History and Physical Interval Note:  09/03/2023 8:01 AM  Erika Hodge  has presented today for surgery, with the diagnosis of SLIDING HIATAL HERNIA.  The various methods of treatment have been discussed with the patient and family. After consideration of risks, benefits and other options for treatment, the patient has consented to  Procedure(s) with comments: LAPAROSCOPIC REPAIR OF HIATAL HERNIA (N/A) - 60 MIN TRANSORAL INCISIONLESS FUNDOPLICATION (N/A) ESOPHAGOGASTRODUODENOSCOPY (EGD) (N/A) as a surgical intervention.  The patient's history has been reviewed, patient examined, no change in status, stable for surgery.  I have reviewed the patient's chart and labs.  Questions were answered to the patient's satisfaction.     [name] [name]

## 2023-09-03 NOTE — H&P (Signed)
History and Physical  Erika Hodge UJW:[id number] DOB: 05-May-1966 DOA: 09/03/2023  PCP: Galvin Proffer, MD   Chief Complaint: Postoperative admission  HPI: Erika Hodge is a 57 y.o. female with medical history significant for severe GERD, hiatal hernia, hyperlipidemia, depression being admitted to the hospitalist service for observation after laparoscopic hiatal hernia repair and transoral incision less fundoplication.  She under went successful procedures as mentioned above with general surgery and gastroenterology this morning, and they have requested hospitalist observation overnight.  Patient was seen and examined in PACU, she is somewhat somnolent, but awake alert and oriented, endorses some mild upper abdominal pain but has no other complaints.  Review of Systems: Please see HPI for pertinent positives and negatives. A complete 10 system review of systems are otherwise negative.  Past Medical History:  Diagnosis Date   Allergic rhinitis 12/02/2021   Anemia    Anemia of chronic disease 04/08/2022   Anxiety    Bacterial vaginosis 05/25/2023   Bronchiectasis without complication (HCC) 04/07/2022   Cancer (HCC)    Chest pain    Complication of anesthesia    HA  One eye dosent close during and is dry and gets scratched has been to eye doctor 2 times after anesthesia for this   Depression    Elevated cholesterol    Family history of breast cancer 11/29/2021   Gastro-esophageal reflux disease without esophagitis 12/02/2021   Genetic testing 12/11/2021   [employer] CancerNext was Negative. Report date is 12/11/2021.     The CancerNext gene panel offered by [employer] includes sequencing, rearrangement analysis, and RNA analysis for the following 36 genes:   APC, ATM, AXIN2, BARD1, BMPR1A, BRCA1, BRCA2, BRIP1, CDH1, CDK4, CDKN2A, CHEK2, DICER1, HOXB13, EPCAM, GREM1, MLH1, MSH2, MSH3, MSH6, MUTYH, NBN, NF1, NTHL1, PALB2, PMS2, POLD1, POLE, PTEN, RAD51   GERD (gastroesophageal reflux  disease)    Hiatal hernia with GERD    History of therapeutic radiation 12/02/2021   Hypokalemia 04/08/2022   Invasive lobular carcinoma of right breast, stage 1 (HCC) 01/24/2022   Jaw pain    Malignant neoplasm of upper-inner quadrant of left breast in female, estrogen receptor positive (HCC) 11/26/2021   Triple negative   Menorrhagia 05/25/2023   Neutropenic fever (HCC) 04/07/2022   Non Hodgkin's lymphoma (HCC)    Obesity (BMI 30-39.9) 04/08/2022   Psoriasis    Pulmonary nodules 04/07/2022   Restless leg syndrome 04/08/2022   Vitamin D deficiency 05/25/2023   Past Surgical History:  Procedure Laterality Date   BREAST BIOPSY Left 11/20/2021   BREAST RECONSTRUCTION WITH PLACEMENT OF TISSUE EXPANDER AND ALLODERM Bilateral 01/07/2022   Procedure: BILATERAL BREAST RECONSTRUCTION WITH PLACEMENT OF TISSUE EXPANDERS AND ALLODERM;  Surgeon: Glenna Fellows, MD;  Location: [facility];  Service: Plastics;  Laterality: Bilateral;   COLONOSCOPY  04/07/2018   [facility]. Dr. Charna Elizabeth. Four small polyps, 2 in the sigmoid colon and 2 in teh proximal ascending colon - removed by cold biopsies. One 7 mm sessile polyps in the mid sigmoid colon, removed with a hot snare x 1; resected and retrived. The examined portion of the ileum was normal. The ecamination was otherwise normal on direct and retroflexion views.   ESOPHAGOGASTRODUODENOSCOPY  11/19/2015   [facility]. Dr. Charna Elizabeth. LA Grade C reflux esophagitis. Esophageal mucosal changes suggestive of Eosinophilic esophagitis noted in the distal 2/3 of the esophagus-biopsies done. Medium-sized hiatal hernia noted on retroplexion. Moderate diffuse gastritis. Normal examined duodenum.   ESOPHAGOGASTRODUODENOSCOPY  02/12/2023   [facility]. Dr. Jeani Hawking.  LA Grade D reflux esophagitis with no bleeding. 3 cm hiatal hernia. Gastrophageal flap valve classified as Hill Grade II ( fold present,  opens with respiration). Normal stomach. Normal examined duodenum. No specimens collected.   NIPPLE SPARING MASTECTOMY Right 01/07/2022   Procedure: RIGHT NIPPLE SPARING MASTECTOMY;  Surgeon: Harriette Bouillon, MD;  Location: [facility];  Service: General;  Laterality: Right;   NIPPLE SPARING MASTECTOMY WITH SENTINEL LYMPH NODE BIOPSY Left 01/07/2022   Procedure: LEFT NIPPLE SPARING MASTECTOMY WITH SENTINEL LYMPH NODE BIOPSY;  Surgeon: Harriette Bouillon, MD;  Location: [facility];  Service: General;  Laterality: Left;   REMOVAL OF BILATERAL TISSUE EXPANDERS WITH PLACEMENT OF BILATERAL BREAST IMPLANTS Bilateral 07/15/2022   Procedure: REMOVAL OF BILATERAL TISSUE EXPANDERS WITH PLACEMENT OF BILATERAL BREAST IMPLANTS;  Surgeon: Glenna Fellows, MD;  Location: [facility];  Service: Plastics;  Laterality: Bilateral;   TUBAL LIGATION      Social History:  reports that she quit smoking about 8 years ago. Her smoking use included cigarettes. She started smoking about 23 years ago. She has a 7.5 pack-year smoking history. She has never been exposed to tobacco smoke. She has never used smokeless tobacco. She reports that she does not currently use alcohol. She reports that she does not use drugs.   Allergies  Allergen Reactions   Amoxicillin Rash and Other (See Comments)    Throat closing. Tolerates Cefepime   Ibuprofen Other (See Comments), Nausea And Vomiting and Nausea Only   Ciprofloxacin Swelling    Facial swelling and throat swelling   Codeine Nausea Only    Family History  Problem Relation Age of Onset   Breast cancer Mother        dx. 17s, metastasized   Throat cancer Father        dx. 60s   Cancer Maternal Uncle        unknown type   Lung disease Neg Hx    Liver disease Neg Hx    Colon cancer Neg Hx      Prior to Admission medications   Medication Sig Start Date End Date Taking? Authorizing Provider  albuterol (VENTOLIN HFA) 108 (90  Base) MCG/ACT inhaler Inhale 2 puffs into the lungs every 6 (six) hours as needed for wheezing or shortness of breath.   Yes [provider]  anastrozole (ARIMIDEX) 1 MG tablet Take 1 tablet (1 mg total) by mouth daily. 06/04/23  Yes Pollyann Samples, NP  BIOTIN PO Take 1 tablet by mouth daily.   Yes [provider]  FIBER PO Take 1 tablet by mouth daily.   Yes [provider]  FLUoxetine (PROZAC) 20 MG capsule Take 20 mg by mouth daily. 02/15/23  Yes [provider]  fluticasone (FLONASE) 50 MCG/ACT nasal spray Place 1-2 sprays into both nostrils daily as needed for allergies or rhinitis.   Yes [provider]  gabapentin (NEURONTIN) 100 MG capsule Take 1 capsule by mouth daily at bedtime, can slowly titrate up to 1 capsule three (3) times daily if tolerated 07/13/23  Yes Pollyann Samples, NP  lisdexamfetamine (VYVANSE) 40 MG capsule Take 40 mg by mouth daily as needed (concentration). 04/16/23  Yes [provider]  loratadine-pseudoephedrine (CLARITIN-D 24-HOUR) 10-240 MG 24 hr tablet Take 1 tablet by mouth daily.   Yes [provider]  Prenatal Vit-Fe Fumarate-FA (PRENATAL MULTIVITAMIN) TABS tablet Take 1 tablet by mouth daily at 12  noon.   Yes [provider]  rOPINIRole (REQUIP) 0.25 MG tablet Take 0.5 mg by mouth at bedtime. 02/27/23  Yes [provider]  rosuvastatin (CRESTOR) 5 MG tablet Take 5 mg by mouth daily. 03/13/23  Yes [provider]  Tapinarof (VTAMA) 1 % CREA Apply 1 Application topically daily.   Yes [provider]  TURMERIC PO Take 1 capsule by mouth daily.   Yes [provider]  Vonoprazan Fumarate (VOQUEZNA) 20 MG TABS Take 20 mg by mouth daily.   Yes [provider]  zolpidem (AMBIEN) 10 MG tablet Take 10 mg by mouth at bedtime. 12/03/21  Yes [provider]  prochlorperazine (COMPAZINE) 10 MG tablet Take 1 tablet (10 mg total) by mouth every 6 (six) hours as  needed (Nausea or vomiting). 02/04/22 07/09/22  Malachy Mood, MD    Physical Exam: BP (!) 165/79   Pulse 89   Temp 98 F (36.7 C)   Resp (!) 23   Ht 5\' 3"  (1.6 m)   Wt 79.8 kg   SpO2 96%   BMI 31.18 kg/m   General:  Alert, oriented, calm, in no acute distress, sleepy in PACU Cardiovascular: RRR, no murmurs or rubs, no peripheral edema  Respiratory: clear to auscultation bilaterally, no wheezes, no crackles  Abdomen: soft, appropriately tender, slightly distended, normal bowel tones heard  Skin: dry, no rashes  Musculoskeletal: no joint effusions, normal range of motion  Neurologic: extraocular muscles intact, clear speech, moving all extremities with intact sensorium         Labs on Admission:  Basic Metabolic Panel: No results for input(s): "NA", "K", "CL", "CO2", "GLUCOSE", "BUN", "CREATININE", "CALCIUM", "MG", "PHOS" in the last 168 hours. Liver Function Tests: No results for input(s): "AST", "ALT", "ALKPHOS", "BILITOT", "PROT", "ALBUMIN" in the last 168 hours. No results for input(s): "LIPASE", "AMYLASE" in the last 168 hours. No results for input(s): "AMMONIA" in the last 168 hours. CBC: No results for input(s): "WBC", "NEUTROABS", "HGB", "HCT", "MCV", "PLT" in the last 168 hours. Cardiac Enzymes: No results for input(s): "CKTOTAL", "CKMB", "CKMBINDEX", "TROPONINI" in the last 168 hours.  BNP (last 3 results) No results for input(s): "BNP" in the last 8760 hours.  ProBNP (last 3 results) No results for input(s): "PROBNP" in the last 8760 hours.  CBG: No results for input(s): "GLUCAP" in the last 168 hours.  Radiological Exams on Admission: No results found.  Assessment/Plan Erika Hodge is a 57 y.o. 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   Status post laparoscopic hiatal hernia repair and transoral incision less fundoplication -Discussed with Dr. Barron Alvine who will follow along -Diet and postoperative management per surgery/GI, anticipate she can discharge home in the morning  History of triple negative breast cancer-status post bilateral mastectomy, currently on anastrozole.  Under the care of Dr. Mosetta Putt. -Continue anastrozole  Depression-Prozac  Hyperlipidemia-Crestor  Insomnia-Ambien at bedtime  DVT prophylaxis: SCDs only due to recent surgery    Code Status: Full Code  Consults called: None  Admission status: Observation  Time spent: 46 minutes  Edilberto Roosevelt Sharlette Dense MD [facility] Pager [phone]  If 7PM-7AM, please contact night-coverage [email or web address] Password [email or web address]  09/03/2023, 12:02 PM

## 2023-09-03 NOTE — Anesthesia Postprocedure Evaluation (Signed)
Anesthesia Post Note  Patient: Erika Hodge  Procedure(s) Performed: LAPAROSCOPIC REPAIR OF SLIDING HIATAL HERNIA TRANSORAL INCISIONLESS FUNDOPLICATION ESOPHAGOGASTRODUODENOSCOPY (EGD)     Patient location during evaluation: PACU Anesthesia Type: General Level of consciousness: awake and alert Pain management: pain level controlled Vital Signs Assessment: post-procedure vital signs reviewed and stable Respiratory status: spontaneous breathing, nonlabored ventilation and respiratory function stable Cardiovascular status: blood pressure returned to baseline Postop Assessment: no apparent nausea or vomiting Anesthetic complications: no   No notable events documented.  Last Vitals:  Vitals:   09/03/23 1115 09/03/23 1130  BP: (!) 166/86 (!) 163/83  Pulse: 96 96  Resp: 19 (!) 24  Temp:    SpO2: 100% 98%    Last Pain:  Vitals:   09/03/23 1130  TempSrc:   PainSc: 0-No pain                 Shanda Howells

## 2023-09-03 NOTE — Anesthesia Procedure Notes (Addendum)
Procedure Name: Intubation Date/Time: 09/03/2023 8:47 AM  Performed by: Nathen May, CRNAPre-anesthesia Checklist: Patient identified, Emergency Drugs available, Suction available and Patient being monitored Patient Re-evaluated:Patient Re-evaluated prior to induction Oxygen Delivery Method: Circle System Utilized Preoxygenation: Pre-oxygenation with 100% oxygen Induction Type: IV induction Ventilation: Mask ventilation without difficulty Laryngoscope Size: Mac and 3 Grade View: Grade II Tube type: Oral Tube size: 7.0 mm Number of attempts: 1 Airway Equipment and Method: Stylet and Oral airway Placement Confirmation: ETT inserted through vocal cords under direct vision, positive ETCO2 and breath sounds checked- equal and bilateral Secured at: 22 cm Tube secured with: Tape Dental Injury: Teeth and Oropharynx as per pre-operative assessment

## 2023-09-03 NOTE — Consult Note (Signed)
CC: here for surgery  Requesting provider: dr Barron Alvine  HPI: Erika Hodge is an 57 y.o. female who is here for laparoscopic hiatal hernia repair with cTIF. Denies any medical changes since seen in clinic. Had evaluation by cards and was cleared and had routine f/u with oncology.  Had a skin cancer removed from leg  Old hpi: She is accompanied by her husband. She is here to talk about a combined procedure for reflux control. She saw Dr. Barron Alvine to discuss reflux surgery but she was found to have a small sliding hiatal hernia. She has had reflux for many years. She was found to have severe esophagitis on EGD in April and she was started on Voquenza and she no longer has heartburn. She states that she burps and belches frequently.  She initially was treated with Zantac and then she was switched to Nexium and then omeprazole. Most recently with the new medication her heartburn is well-controlled. She can sleep flat. She still has an occasional choking sensation. She denies any pain with swallowing. She will also sometimes drink ginger ale to help "calm her stomach ". She has a daily bowel movement. She takes fiber Gummies. No diarrhea.  He underwent repeat upper endoscopy with Dr. Salena Saner recently and she underwent an esophagram.  She does endorse some occasional chest pain with radiation to her jaw.   Past Medical History:  Diagnosis Date   Allergic rhinitis 12/02/2021   Anemia    Anemia of chronic disease 04/08/2022   Anxiety    Bacterial vaginosis 05/25/2023   Bronchiectasis without complication (HCC) 04/07/2022   Cancer (HCC)    Chest pain    Complication of anesthesia    HA  One eye dosent close during and is dry and gets scratched has been to eye doctor 2 times after anesthesia for this   Depression    Elevated cholesterol    Family history of breast cancer 11/29/2021   Gastro-esophageal reflux disease without esophagitis 12/02/2021   Genetic testing 12/11/2021   [facility]  CancerNext was Negative. Report date is 12/11/2021.     The CancerNext gene panel offered by 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   Menorrhagia 05/25/2023   Neutropenic fever (HCC) 04/07/2022   Non Hodgkin's lymphoma (HCC)    Obesity (BMI 30-39.9) 04/08/2022   Psoriasis    Pulmonary nodules 04/07/2022   Restless leg syndrome 04/08/2022   Vitamin D deficiency 05/25/2023    Past Surgical History:  Procedure Laterality Date   BREAST BIOPSY Left 11/20/2021   BREAST RECONSTRUCTION WITH PLACEMENT OF TISSUE EXPANDER AND ALLODERM Bilateral 01/07/2022   Procedure: BILATERAL BREAST RECONSTRUCTION WITH PLACEMENT OF TISSUE EXPANDERS AND ALLODERM;  Surgeon: Glenna Fellows, MD;  Location: [facility];  Service: Plastics;  Laterality: Bilateral;   COLONOSCOPY  04/07/2018   [facility]. Dr. Charna Elizabeth. Four small polyps, 2 in the sigmoid colon and  2 in teh proximal ascending colon - removed by cold biopsies. One 7 mm sessile polyps in the mid sigmoid colon, removed with a hot snare x 1; resected and retrived. The examined portion of the ileum was normal. The ecamination was otherwise normal on direct and retroflexion views.   ESOPHAGOGASTRODUODENOSCOPY  11/19/2015   [facility]. Dr. Charna Elizabeth. LA Grade C reflux esophagitis. Esophageal mucosal  changes suggestive of Eosinophilic esophagitis noted in the distal 2/3 of the esophagus-biopsies done. Medium-sized hiatal hernia noted on retroplexion. Moderate diffuse gastritis. Normal examined duodenum.   ESOPHAGOGASTRODUODENOSCOPY  02/12/2023   [facility]. Dr. Jeani Hawking.  LA Grade D reflux esophagitis with no bleeding. 3 cm hiatal hernia. Gastrophageal flap valve classified as Hill Grade II ( fold present, opens with respiration). Normal stomach. Normal examined duodenum. No specimens collected.   NIPPLE SPARING MASTECTOMY Right 01/07/2022   Procedure: RIGHT NIPPLE SPARING MASTECTOMY;  Surgeon: Harriette Bouillon, MD;  Location: [facility];  Service: General;  Laterality: Right;   NIPPLE SPARING MASTECTOMY WITH SENTINEL LYMPH NODE BIOPSY Left 01/07/2022   Procedure: LEFT NIPPLE SPARING MASTECTOMY WITH SENTINEL LYMPH NODE BIOPSY;  Surgeon: Harriette Bouillon, MD;  Location: [facility];  Service: General;  Laterality: Left;   REMOVAL OF BILATERAL TISSUE EXPANDERS WITH PLACEMENT OF BILATERAL BREAST IMPLANTS Bilateral 07/15/2022   Procedure: REMOVAL OF BILATERAL TISSUE EXPANDERS WITH PLACEMENT OF BILATERAL BREAST IMPLANTS;  Surgeon: Glenna Fellows, MD;  Location: [facility];  Service: Plastics;  Laterality: Bilateral;   TUBAL LIGATION      Family History  Problem Relation Age of Onset   Breast cancer Mother        dx. 58s, metastasized   Throat cancer Father        dx. 60s   Cancer Maternal Uncle        unknown type   Lung disease Neg Hx    Liver disease Neg Hx    Colon cancer Neg Hx     Social:  reports that she quit smoking about 8 years ago. Her smoking use included cigarettes. She started smoking about 23 years ago. She has a 7.5 pack-year smoking history. She has never been exposed to tobacco smoke. She has never used smokeless tobacco. She reports that she does not currently use alcohol. She reports that she does not use  drugs.  Allergies:  Allergies  Allergen Reactions   Amoxicillin Rash and Other (See Comments)    Throat closing. Tolerates Cefepime   Ibuprofen Other (See Comments), Nausea And Vomiting and Nausea Only   Ciprofloxacin Swelling    Facial swelling and throat swelling   Codeine Nausea Only    Medications: I have reviewed the patient's current medications.   ROS - all of the below systems have been reviewed with the patient and positives are indicated with bold text General: chills, fever or night sweats Eyes: blurry vision or double vision ENT: epistaxis or sore throat Allergy/Immunology: itchy/watery eyes or nasal congestion Hematologic/Lymphatic: bleeding problems, blood clots or swollen lymph nodes Endocrine: temperature intolerance or unexpected weight changes Breast: new or changing breast lumps or nipple discharge Resp: cough, shortness of breath, or wheezing CV: chest pain or dyspnea on exertion GI: as per HPI GU: dysuria, trouble voiding, or hematuria MSK: joint pain or joint stiffness Neuro: TIA or stroke symptoms Derm: pruritus and skin lesion changes Psych: anxiety and depression  PE Blood pressure (!) 158/89, pulse 86, temperature 98.1 F (36.7 C), temperature source  Oral, resp. rate 16, height 5\' 3"  (1.6 m), weight 79.8 kg, SpO2 95%. Constitutional: NAD; conversant; no deformities Eyes: Moist conjunctiva; no lid lag; anicteric; PERRL Neck: Trachea midline; no thyromegaly Lungs: Normal respiratory effort; no tactile fremitus CV: RRR; no palpable thrills; no pitting edema GI: Abd soft, nt; no palpable hepatosplenomegaly MSK: Normal gait; no clubbing/cyanosis Psychiatric: Appropriate affect; alert and oriented x3 Lymphatic: No palpable cervical or axillary lymphadenopathy Skin:no rash/lesions  No results found for this or any previous visit (from the past 48 hour(s)).  No results found.  Imaging: reviewed  A/P: SOFIE GUINYARD is an 56 y.o. female with  Hiatal hernia with GERD   To OR for lap repair of sliding hiatal hernia repair with cTIF IV abx on call All questions asked and answered ERAS   Mary Sella. Andrey Campanile, MD, FACS General, Bariatric, & Minimally Invasive Surgery [facility]

## 2023-09-03 NOTE — H&P (Signed)
Chief Complaint: GERD with erosive esophagitis, Hiatal hernia  HPI:     Erika Hodge is a 57 y.o. female presenting for concomitant laparoscopic hiatal hernia repair and Transoral Incisionless Fundoplication (cTIF).  She is a longstanding history of GERD c/b erosive esophagitis and hiatal hernia.  Continues to have bothersome regurgitation and sore throat despite multiple acid suppression agents over the years.  She presents for cTIF as a means to better control her reflux and hopefully stop or significantly reduce the need for acid suppression therapy long-term.  GERD history: -Index symptoms: Heartburn, regurgitation.  Intermittent dysphagia, nausea/vomiting -Exacerbating features: Overeating -Medications trialed: Prilosec, Nexium, Tums -Current medications: Voquenza 20 mg daily -Complications: Hiatal hernia, EE   GERD evaluation: -Last EGD: 02/12/2023 -Barium esophagram: Ordered today -Esophageal Manometry: None -pH/Impedance: None -Bravo: None   Endoscopic History: - 11/19/2015: EGD (Dr. Loreta Ave): LA Grade C esophagitis, longitudinal furrows in the middle/lower esophagus with EOE score 1 (path: Normal, no elevated eosinophils).  Medium size hiatal hernia.  Moderate gastritis, normal duodenum - 04/07/2018: Colonoscopy (Dr. Loreta Ave): 2 sigmoid HP's, and 2 proximal ascending colon adenomas, 7 mm mid sigmoid polyp removed with hot snare (path: HP).  Normal TI.  Repeat in 5 years - 02/12/2023: EGD (Dr. Elnoria Howard): LA Grade D esophagitis, 3 cm HH.  Hill grade 2 valve.  Normal stomach and duodenum.  Started on Voquenza   Past Medical History:  Diagnosis Date   Allergic rhinitis 12/02/2021   Anemia    Anemia of chronic disease 04/08/2022   Anxiety    Bacterial vaginosis 05/25/2023   Bronchiectasis without complication (HCC) 04/07/2022   Cancer (HCC)    Chest pain    Complication of anesthesia    HA  One eye dosent close during and is dry and gets scratched has been to eye doctor 2  times after anesthesia for this   Depression    Elevated cholesterol    Family history of breast cancer 11/29/2021   Gastro-esophageal reflux disease without esophagitis 12/02/2021   Genetic testing 12/11/2021   [facility] was Negative. Report date is 12/11/2021.     The CancerNext gene panel offered by 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   Menorrhagia 05/25/2023   Neutropenic fever (HCC) 04/07/2022   Non Hodgkin's lymphoma (HCC)    Obesity (BMI 30-39.9) 04/08/2022   Psoriasis    Pulmonary nodules 04/07/2022   Restless leg syndrome 04/08/2022   Vitamin D deficiency 05/25/2023     Past Surgical History:  Procedure Laterality Date   BREAST BIOPSY Left 11/20/2021   BREAST RECONSTRUCTION WITH PLACEMENT OF TISSUE EXPANDER AND ALLODERM Bilateral 01/07/2022   Procedure: BILATERAL BREAST RECONSTRUCTION WITH PLACEMENT OF TISSUE EXPANDERS AND ALLODERM;  Surgeon: Glenna Fellows, MD;  Location: [facility];  Service: Plastics;  Laterality: Bilateral;   COLONOSCOPY  04/07/2018   [facility]. Dr. Charna Elizabeth. Four small polyps, 2 in the  sigmoid colon and 2 in teh proximal ascending colon - removed by cold biopsies. One 7 mm sessile polyps in the mid sigmoid colon, removed with a hot snare x 1; resected and retrived. The examined portion of the ileum  was normal. The ecamination was otherwise normal on direct and retroflexion views.   ESOPHAGOGASTRODUODENOSCOPY  11/19/2015   [facility]. Dr. Charna Elizabeth. LA Grade C reflux esophagitis. Esophageal mucosal changes suggestive of Eosinophilic esophagitis noted in the distal 2/3 of the esophagus-biopsies done. Medium-sized hiatal hernia noted on retroplexion. Moderate diffuse gastritis. Normal examined duodenum.   ESOPHAGOGASTRODUODENOSCOPY  02/12/2023   [facility]. Dr. Jeani Hawking.  LA Grade D reflux esophagitis with no bleeding. 3 cm hiatal hernia. Gastrophageal flap valve classified as Hill Grade II ( fold present, opens with respiration). Normal stomach. Normal examined duodenum. No specimens collected.   NIPPLE SPARING MASTECTOMY Right 01/07/2022   Procedure: RIGHT NIPPLE SPARING MASTECTOMY;  Surgeon: Harriette Bouillon, MD;  Location: [facility];  Service: General;  Laterality: Right;   NIPPLE SPARING MASTECTOMY WITH SENTINEL LYMPH NODE BIOPSY Left 01/07/2022   Procedure: LEFT NIPPLE SPARING MASTECTOMY WITH SENTINEL LYMPH NODE BIOPSY;  Surgeon: Harriette Bouillon, MD;  Location: [facility];  Service: General;  Laterality: Left;   REMOVAL OF BILATERAL TISSUE EXPANDERS WITH PLACEMENT OF BILATERAL BREAST IMPLANTS Bilateral 07/15/2022   Procedure: REMOVAL OF BILATERAL TISSUE EXPANDERS WITH PLACEMENT OF BILATERAL BREAST IMPLANTS;  Surgeon: Glenna Fellows, MD;  Location: [facility];  Service: Plastics;  Laterality: Bilateral;   TUBAL LIGATION     Family History  Problem Relation Age of Onset   Breast cancer Mother        dx. 15s, metastasized   Throat cancer Father        dx. 60s   Cancer Maternal Uncle        unknown type   Lung disease Neg Hx    Liver disease Neg Hx    Colon cancer Neg Hx    Social History   Tobacco Use   Smoking status: Former    Current packs/day: 0.00    Average packs/day: 0.5 packs/day for 15.0  years (7.5 ttl pk-yrs)    Types: Cigarettes    Start date: 2001    Quit date: 2016    Years since quitting: 8.8    Passive exposure: Never   Smokeless tobacco: Never  Vaping Use   Vaping status: Never Used  Substance Use Topics   Alcohol use: Not Currently    Comment: occas wine   Drug use: Never   Current Facility-Administered Medications  Medication Dose Route Frequency Provider Last Rate Last Admin   0.9 %  sodium chloride infusion   Intravenous Continuous Savahna Casados V, DO       clindamycin (CLEOCIN) IVPB 900 mg  900 mg Intravenous Once Aryiah Monterosso V, DO       famotidine (PEPCID) IVPB 20 mg premix  20 mg Intravenous Once Leah Skora V, DO       lactated ringers infusion   Intravenous Continuous Lewie Loron, MD 10 mL/hr at 09/03/23 0734 New Bag at 09/03/23 0734   ondansetron (ZOFRAN) injection 4 mg  4 mg Intravenous Once Kannon Granderson V, DO       scopolamine (TRANSDERM-SCOP) 1 MG/3DAYS 1.5 mg  1 patch Transdermal Once Kaylyn Layer, MD   1.5 mg at 09/03/23 0709   scopolamine (TRANSDERM-SCOP) 1 MG/3DAYS 1.5 mg  1 patch Transdermal Once Lynnley Doddridge, [employer]  V, DO       Allergies  Allergen Reactions   Amoxicillin Rash and Other (See Comments)    Throat closing. Tolerates Cefepime   Ibuprofen Other (See Comments), Nausea And Vomiting and Nausea Only   Ciprofloxacin Swelling    Facial swelling and throat swelling   Codeine Nausea Only     Review of Systems: All systems reviewed and negative except where noted in HPI.     Physical Exam:    Wt Readings from Last 3 Encounters:  09/03/23 79.8 kg  08/13/23 79.8 kg  08/07/23 88.5 kg    BP (!) 158/89   Pulse 86   Temp 98.1 F (36.7 C) (Oral)   Resp 16   Ht 5\' 3"  (1.6 m)   Wt 79.8 kg   SpO2 95%   BMI 31.18 kg/m  Constitutional:  Pleasant, in no acute distress. Psychiatric: Normal mood and affect. Behavior is normal. EENT: Pupils normal.  Conjunctivae are normal. No scleral icterus. Neck supple.  No cervical LAD. Cardiovascular: Normal rate, regular rhythm. No edema Pulmonary/chest: Effort normal and breath sounds normal. No wheezing, rales or rhonchi. Abdominal: Soft, nondistended, nontender. Bowel sounds active throughout. There are no masses palpable. No hepatomegaly. Neurological: Alert and oriented to person place and time. Skin: Skin is warm and dry. No rashes noted.   ASSESSMENT AND PLAN;   1) GERD with erosive esophagitis 2) Hiatal hernia 3) Regurgitation - Plan for concomitant laparoscopic hiatal hernia repair and TIF today - Plan for admission from PACU for overnight observation - Additional recommendations pending completion of surgery today   [name] Rasheedah Reis, DO, FACG  09/03/2023, 7:57 AM   No ref. provider found

## 2023-09-03 NOTE — Transfer of Care (Signed)
Immediate Anesthesia Transfer of Care Note  Patient: Erika Hodge  Procedure(s) Performed: LAPAROSCOPIC REPAIR OF SLIDING HIATAL HERNIA TRANSORAL INCISIONLESS FUNDOPLICATION ESOPHAGOGASTRODUODENOSCOPY (EGD)  Patient Location: PACU  Anesthesia Type:General  Level of Consciousness: drowsy  Airway & Oxygen Therapy: Patient Spontanous Breathing and Patient connected to face mask oxygen  Post-op Assessment: Report given to RN and Post -op Vital signs reviewed and stable  Post vital signs: Reviewed and stable  Last Vitals:  Vitals Value Taken Time  BP 158/87 09/03/23 1111  Temp    Pulse 94 09/03/23 1114  Resp 16 09/03/23 1114  SpO2 100 % 09/03/23 1114  Vitals shown include unfiled device data.  Last Pain:  Vitals:   09/03/23 0700  TempSrc:   PainSc: 0-No pain      Patients Stated Pain Goal: 3 (09/03/23 0700)  Complications: No notable events documented.

## 2023-09-03 NOTE — Op Note (Addendum)
PATIENT: Erika Hodge    PRE-OPERATIVE DIAGNOSIS:  GERD/ HIATAL HERNIA   POST-OPERATIVE DIAGNOSIS:  GERD/ SLIDING HIATAL HERNIA   PROCEDURE:  Procedure(s): LAPAROSCOPIC REPAIR OF HIATAL HERNIA - Erika Hodge ESOPHAGOGASTRODUODENOSCOPY (EGD) TRANSORAL INCISIONLESS FUNDOPLICATION - Erika Hodge LAPAROSCOPIC BILATERAL TAP BLOCK     SURGEON:  Surgeon(s): Erika Adu, MD  Erika Locks MD   ASSISTANTS: Erika Grumbles, MD   ANESTHESIA:   general   DRAINS: none    LOCAL MEDICATIONS USED:  MARCAINE    and OTHER exparel   SPECIMEN:  No Specimen   DISPOSITION OF SPECIMEN:  N/A   COUNTS:  YES   INDICATION FOR PROCEDURE: Patient has a longstanding history of GERD and an endoscopic evidence of a probable sliding hiatal hernia.  The patient was evaluated by gastroenterology for concomitant TIF procedure with hiatal hernia repair.  Please see outside records for additional information   PROCEDURE: Patient received oral Tylenol and gabapentin preoperatively.  The patient also received additional antiemetic medications preoperatively.  The patient received 5000 units of subcutaneous heparin.  The patient was taken to the OR 1 at [facility] and placed upon on the operating room table.  General endotracheal anesthesia was established.  Sequential compression devices were placed.  The patient's arms were tucked at the side with the appropriate padding.  The patient received IV antibiotic prior to skin incision.  Surgical timeout was performed.   Access to the abdomen was obtained in the left upper quadrant at Palmer's point using Optiview technique.  A small incision was made then using a 0 degree 5 mm laparoscope through a 5 mm trocar I advanced it through all layers of the abdominal wall and carefully entered the abdominal cavity.  Pneumoperitoneum was smoothly established up to a patient pressure of 15 mmHg without any change in patient vital signs.  There is no evidence of injury to  surrounding viscera.  The patient was placed in steep reverse Trendelenburg.  A 5 mm trocar was placed slightly above into the left of the umbilicus.  A 12 mm trocar was placed in the right midabdomen followed by a 5 mm trocar in the lateral right abdomen.  A 5 mm trocar was placed in the subxiphoid position.  I bilateral Exparel/MARCAINE tap block was performed for postoperative pain relief.  The 5 mm subxiphoid trocar was removed and a Nathanson liver retractor was used to lift up the left lobe of the liver.  The stomach and diaphragm were inspected.  There was a small hiatal hernia of about 2 cm.    The gastrohepatic ligament was incised with harmonic scalpel.  My assistant grasped the lesser curvature adipose tissue and reflected it to patient's left.  Identified the right crus of the diaphragm.  I gently incised it with harmonic scalpel keeping the peritoneum intact  Then using blunt dissection with a laparoscopic  Prestige grasper I was able to identify the left crus of the diaphragm.  We are able to achieve a circumferential mobilization of the distal esophagus at the GE junction.  I took down some anterior attachments as well.  The fundus attachment to the left crus was also taken down with a Harmonic scalpel.  I did not have to mobilize any of the short gastric vessels.  We continued a circumferential mobilization of the esophagus within the mediastinum.  The aorta was identified.  The aorta was visualized.  There is no distal esophageal lipoma.  I identified the anterior vagus nerve as well.  the  posterior vagus nerve was identified.    It was probably about a 3 cm transverse hernia.  I closed the left and right crus with 3 interrupted 0 Ethibond sutures.  This provided a snug fit around the esophagus that left some room to pass a large bougie.  Upper abdominal cavity was reinspected.  No evidence of bleeding.  The [facility] liver retractor was removed without injury to the liver.  I inspected the optical  entry site.  There had been some insufflation around the greater colic ligament.  I took down some of the omentum off the transverse colon.  My partner I inspected the transverse colon both the anterior and posterior wall of the transverse colon and there was no evidence of colotomy.  There is no evidence of enterotomy.  The 12 mm trocar was removed and a 0 Vicryl in the PMI suture passer was used to close the fascial defect at that location.  Additional Exparel Marcaine mixture was infiltrated in this location.  Pneumoperitoneum was released.  Remaining trochars were removed.  Skin incisions were closed by me in a subcuticular fashion and followed by Dermabond.  All needle, instrument, and sponge counts were correct x2.  The patient remained intubated.   After laparoscopic hiatal hernia repair was complete, Dr. Doristine Hodge, a gastroenterologist with endoscopic procedure expertise and trained in endoscopic fundoplication, proceeded to perform endoscopic fundoplication and he will dictate that separately.    I was present during the initial portion of when the endoscope was advanced by Dr. Barron Alvine down the esophagus into the stomach.  There is no evidence of injury to the esophagus.  There is no evidence of mucosal disruption.     PLAN OF CARE: Admit for overnight observation   PATIENT DISPOSITION:  PACU - hemodynamically stable.   Delay start of Pharmacological VTE agent (>24hrs) due to surgical blood loss or risk of bleeding:  no   Erika Hodge. Erika Campanile, MD, FACS General, Bariatric, & Minimally Invasive Surgery [facility], [place]

## 2023-09-04 ENCOUNTER — Encounter (HOSPITAL_COMMUNITY): Payer: Self-pay | Admitting: General Surgery

## 2023-09-04 DIAGNOSIS — K2100 Gastro-esophageal reflux disease with esophagitis, without bleeding: Secondary | ICD-10-CM | POA: Diagnosis not present

## 2023-09-04 DIAGNOSIS — K449 Diaphragmatic hernia without obstruction or gangrene: Secondary | ICD-10-CM | POA: Diagnosis not present

## 2023-09-04 LAB — HIV ANTIBODY (ROUTINE TESTING W REFLEX): HIV Screen 4th Generation wRfx: NONREACTIVE

## 2023-09-04 MED ORDER — OXYCODONE HCL 5 MG PO TABS: 5.0000 mg | ORAL_TABLET | Freq: Four times a day (QID) | ORAL | 0 refills | Status: AC | PRN

## 2023-09-04 MED ORDER — SIMETHICONE 80 MG PO CHEW: 80.0000 mg | CHEWABLE_TABLET | Freq: Four times a day (QID) | ORAL | 0 refills | Status: DC | PRN

## 2023-09-04 MED ORDER — ONDANSETRON HCL 4 MG PO TABS: 4.0000 mg | ORAL_TABLET | Freq: Three times a day (TID) | ORAL | 0 refills | Status: DC | PRN

## 2023-09-04 MED ORDER — PANTOPRAZOLE SODIUM 40 MG PO TBEC: 40.0000 mg | DELAYED_RELEASE_TABLET | Freq: Two times a day (BID) | ORAL | 0 refills | Status: DC

## 2023-09-04 NOTE — Discharge Summary (Signed)
Physician Discharge Summary  Erika Hodge WGN:[id number] DOB: 12-23-1965 DOA: 09/03/2023  PCP: Galvin Proffer, MD  Admit date: 09/03/2023 Discharge date: 09/04/2023  Admitted From: Home Disposition: Home  Recommendations for Outpatient Follow-up:  Follow up with PCP in 1-2 weeks GI to schedule follow-up  Discharge Condition: Stable CODE STATUS: Full code Diet recommendation: Full liquid diet until further follow-up  Discharge summary: 57 year old with severe GERD, hiatal hernia, hyperlipidemia and depression, breast cancer who underwent elective laparoscopic hiatal hernia repair and endoscopic fundoplication by GI and surgery and was monitored in the hospital overnight.  Clinically improving.  No overnight events.  Going home today.  Short course of pain medications were prescribed.  As per GI recommendation, she will stay on high-dose PPI, full liquid diet for 2 weeks, nausea medications and antacids.   Discharge Diagnoses:  Principal Problem:   Hiatal hernia with GERD and esophagitis Active Problems:   Hiatal hernia    Discharge Instructions  Discharge Instructions     Call MD for:  persistant nausea and vomiting   Complete by: As directed    Call MD for:  severe uncontrolled pain   Complete by: As directed    Diet full liquid   Complete by: As directed    Increase activity slowly   Complete by: As directed       Allergies as of 09/04/2023       Reactions   Amoxicillin Rash, Other (See Comments)   Throat closing. Tolerates Cefepime   Ibuprofen Other (See Comments), Nausea And Vomiting, Nausea Only   Ciprofloxacin Swelling   Facial swelling and throat swelling   Codeine Nausea Only        Medication List     STOP taking these medications    Voquezna 20 MG Tabs Generic drug: Vonoprazan Fumarate       TAKE these medications    albuterol 108 (90 Base) MCG/ACT inhaler Commonly known as: VENTOLIN HFA Inhale 2 puffs into the lungs every 6 (six)  hours as needed for wheezing or shortness of breath.   anastrozole 1 MG tablet Commonly known as: ARIMIDEX Take 1 tablet (1 mg total) by mouth daily.   BIOTIN PO Take 1 tablet by mouth daily.   FIBER PO Take 1 tablet by mouth daily.   FLUoxetine 20 MG capsule Commonly known as: PROZAC Take 20 mg by mouth daily.   fluticasone 50 MCG/ACT nasal spray Commonly known as: FLONASE Place 1-2 sprays into both nostrils daily as needed for allergies or rhinitis.   gabapentin 100 MG capsule Commonly known as: Neurontin Take 1 capsule by mouth daily at bedtime, can slowly titrate up to 1 capsule three (3) times daily if tolerated   lisdexamfetamine 40 MG capsule Commonly known as: VYVANSE Take 40 mg by mouth daily as needed (concentration).   loratadine-pseudoephedrine 10-240 MG 24 hr tablet Commonly known as: CLARITIN-D 24-hour Take 1 tablet by mouth daily.   ondansetron 4 MG tablet Commonly known as: Zofran Take 1 tablet (4 mg total) by mouth every 8 (eight) hours as needed for nausea or vomiting.   oxyCODONE 5 MG immediate release tablet Commonly known as: Oxy IR/ROXICODONE Take 1 tablet (5 mg total) by mouth every 6 (six) hours as needed for up to 5 days for moderate pain (pain score 4-6).   pantoprazole 40 MG tablet Commonly known as: PROTONIX Take 1 tablet (40 mg total) by mouth 2 (two) times daily.   prenatal multivitamin Tabs tablet Take 1 tablet by mouth  daily at 12 noon.   rOPINIRole 0.25 MG tablet Commonly known as: REQUIP Take 0.5 mg by mouth at bedtime.   rosuvastatin 5 MG tablet Commonly known as: CRESTOR Take 5 mg by mouth daily.   simethicone 80 MG chewable tablet Commonly known as: MYLICON Chew 1 tablet (80 mg total) by mouth every 6 (six) hours as needed for flatulence (gas, bloating, abdominal discomfort).   TURMERIC PO Take 1 capsule by mouth daily.   Vtama 1 % Crea Generic drug: Tapinarof Apply 1 Application topically daily.   zolpidem 10 MG  tablet Commonly known as: AMBIEN Take 10 mg by mouth at bedtime.        Follow-up Information     Gaynelle Adu, MD Follow up on 09/30/2023.   Specialty: General Surgery Why: For wound re-check; pls arrive at 8:30 AM Contact information: [address] [place] [address] [phone]                Allergies  Allergen Reactions   Amoxicillin Rash and Other (See Comments)    Throat closing. Tolerates Cefepime   Ibuprofen Other (See Comments), Nausea And Vomiting and Nausea Only   Ciprofloxacin Swelling    Facial swelling and throat swelling   Codeine Nausea Only    Consultations: Surgery  Gastroenterology    Procedures/Studies: No results found. (Echo, Carotid, EGD, Colonoscopy, ERCP)    Subjective: Patient seen in the morning rounds.  Mild abdominal pain present.  She is on liquid diet.  Tolerating liquids.  Husband at the bedside.  Eager to go home.  Case discussed with GI and surgery.   Discharge Exam: Vitals:   09/04/23 0521 09/04/23 0945  BP: (!) 164/81 (!) 166/93  Pulse: 98 96  Resp: 18 18  Temp: 98.3 F (36.8 C) 97.8 F (36.6 C)  SpO2: 93% 93%   Vitals:   09/03/23 2051 09/04/23 0213 09/04/23 0521 09/04/23 0945  BP: (!) 171/98 (!) 149/91 (!) 164/81 (!) 166/93  Pulse: 95 97 98 96  Resp: 18 16 18 18   Temp: 98.1 F (36.7 C) 98.2 F (36.8 C) 98.3 F (36.8 C) 97.8 F (36.6 C)  TempSrc: Oral Oral Oral Oral  SpO2: 94% 94% 93% 93%  Weight:      Height:        General: Pt is alert, awake, not in acute distress Cardiovascular: RRR, S1/S2 +, no rubs, no gallops Respiratory: CTA bilaterally, no wheezing, no rhonchi Abdominal: Soft, mild tenderness along the ports.  ND, bowel sounds + Extremities: no edema, no cyanosis    The results of significant diagnostics from this hospitalization (including imaging, microbiology, ancillary and laboratory) are listed below for reference.     Microbiology: No results found for this or  any previous visit (from the past 240 hour(s)).   Labs: BNP (last 3 results) No results for input(s): "BNP" in the last 8760 hours. Basic Metabolic Panel: No results for input(s): "NA", "K", "CL", "CO2", "GLUCOSE", "BUN", "CREATININE", "CALCIUM", "MG", "PHOS" in the last 168 hours. Liver Function Tests: No results for input(s): "AST", "ALT", "ALKPHOS", "BILITOT", "PROT", "ALBUMIN" in the last 168 hours. No results for input(s): "LIPASE", "AMYLASE" in the last 168 hours. No results for input(s): "AMMONIA" in the last 168 hours. CBC: No results for input(s): "WBC", "NEUTROABS", "HGB", "HCT", "MCV", "PLT" in the last 168 hours. Cardiac Enzymes: No results for input(s): "CKTOTAL", "CKMB", "CKMBINDEX", "TROPONINI" in the last 168 hours. BNP: Invalid input(s): "POCBNP" CBG: No results for input(s): "GLUCAP"  in the last 168 hours. D-Dimer No results for input(s): "DDIMER" in the last 72 hours. Hgb A1c No results for input(s): "HGBA1C" in the last 72 hours. Lipid Profile No results for input(s): "CHOL", "HDL", "LDLCALC", "TRIG", "CHOLHDL", "LDLDIRECT" in the last 72 hours. Thyroid function studies No results for input(s): "TSH", "T4TOTAL", "T3FREE", "THYROIDAB" in the last 72 hours.  Invalid input(s): "FREET3" Anemia work up No results for input(s): "VITAMINB12", "FOLATE", "FERRITIN", "TIBC", "IRON", "RETICCTPCT" in the last 72 hours. Urinalysis    Component Value Date/Time   COLORURINE STRAW (A) 04/07/2022 1717   APPEARANCEUR CLEAR 04/07/2022 1717   LABSPEC 1.004 (L) 04/07/2022 1717   PHURINE 7.0 04/07/2022 1717   GLUCOSEU NEGATIVE 04/07/2022 1717   HGBUR NEGATIVE 04/07/2022 1717   BILIRUBINUR NEGATIVE 04/07/2022 1717   KETONESUR NEGATIVE 04/07/2022 1717   PROTEINUR NEGATIVE 04/07/2022 1717   NITRITE NEGATIVE 04/07/2022 1717   LEUKOCYTESUR NEGATIVE 04/07/2022 1717   Sepsis Labs No results for input(s): "WBC" in the last 168 hours.  Invalid input(s): "PROCALCITONIN",  "LACTICIDVEN" Microbiology No results found for this or any previous visit (from the past 240 hour(s)).   Time coordinating discharge:  28 minutes  SIGNED:   Dorcas Carrow, MD  [facility] 09/04/2023, 10:36 AM

## 2023-09-04 NOTE — Plan of Care (Signed)

## 2023-09-04 NOTE — Op Note (Signed)
[facility] Patient Name: Erika Hodge Procedure Date: 09/03/2023 MRN: [id number] Attending MD: Doristine Locks , MD, [id number] Date of Birth: 1966/08/07 CSN: [id number] Age: 57 Admit Type: Outpatient Procedure:                Upper GI endoscopy w/ Transoral Incisionless                            Fundoplication and savary dilation Indications:              For therapy of reflux esophagitis, For therapy of                            hiatal hernia Providers:                Doristine Locks, DO, Gaynelle Adu, MD (co-surgeon),                            Priscella Mann, Technician Referring MD:              Medicines:                General Anesthesia Complications:            No immediate complications. Estimated Blood Loss:     Estimated blood loss was minimal. Procedure:                Pre-Anesthesia Assessment:                           - Prior to the procedure, a History and Physical                            was performed, and patient medications and                            allergies were reviewed. The patient's tolerance of                            previous anesthesia was also reviewed. The risks                            and benefits of the procedure and the sedation                            options and risks were discussed with the patient.                            All questions were answered, and informed consent                            was obtained. Prior Anticoagulants: The patient has                            taken no anticoagulant or antiplatelet agents. ASA  Grade Assessment: II - A patient with mild systemic                            disease. After reviewing the risks and benefits,                            the patient was deemed in satisfactory condition to                            undergo the procedure.                           After obtaining informed consent, the endoscope was                             passed under direct vision. Throughout the                            procedure, the patient's blood pressure, pulse, and                            oxygen saturations were monitored continuously. The                            GIF-H190 ([id number]) Olympus endoscope was introduced                            through the mouth, and advanced to the second part                            of duodenum. The upper GI endoscopy was                            accomplished without difficulty. The patient                            tolerated the procedure well. Scope In: Scope Out: Findings:      The examined esophagus was normal. In anticipation of placement of the       larger caliber Esophyx device, the decision was made to perform       esophageal dilation. A guidewire was placed and the scope was withdrawn.       Dilation was performed with a Savary dilator with no resistance at 17       mm. The dilation site was examined following endoscope reinsertion and       showed no bleeding, mucosal tear or perforation. Estimated blood loss:       none.      LA Grade A (one or more mucosal breaks less than 5 mm, not extending       between tops of 2 mucosal folds) esophagitis with no bleeding was found       37 cm from the incisors. The decision was made to perform transoral       fundoplication with the EsophyX Z system. Before the procedure, the       gastroesophageal flap valve was  classified as Hill Grade II (fold       present, opens with respiration). The endoscope was withdrawn, placed       through the plication device, reinserted into the patient and advanced       past the level of the GE junction at 37 cm from the incisors and into       the stomach. Next, the endoscope was advanced beyond the device and       retroflexed. The first plication site was identified at the 11 o'clock       position. With the device in the proper position, the helical retractor       was deployed and tissue was  pulled into the mold before it was closed.       The device was rotated, suction was applied using the invaginator, then       the device was advanced slightly and two H-shaped fasteners were placed.       The device was reloaded and the process repeated in order to deploy a       total of six fasteners at the first site. The device was then rotated to       the 1 o'clock position after which the helical retractor was used to       grasp additional tissue within the mold before rotation and deployment       of a total of six fasteners at the second site. To complete       reconstruction of the valve, additional fasteners were deployed at the       following sites: four fasteners at 5 o'clock and four fasteners at 7       o'clock positions. In total, 20 fasteners contributed to create a valve       measuring 3 cm in length which involved 270 degrees of the circumference       upon retroflexed view. Following the procedure, the flap valve was       reclassified as Hill Grade I (prominent fold, tight to endoscope). The       EsophyX device and endoscope were then removed. Relook endoscopy was       performed prior to the conclusion of the case to confirm the above       findings. Estimated blood loss was minimal.      A few small benign fundic gland polyps with no bleeding were found in       the gastric fundus and in the gastric body.      The examined duodenum was normal. Impression:               - Normal esophagus. Dilated with 17 mm Savary                            dilator.                           - LA Grade A reflux esophagitis with no bleeding.                           - A few gastric polyps.                           - Normal examined duodenum.                           -  EsophyX transoral fundoplication was performed.                           - No specimens collected. Moderate Sedation:      Not Applicable - Patient had care per Anesthesia. Recommendation:           -Admit to  surgical ward for overnight observation                            with anticipated discharge tomorrow                           -Zofran 4 mg IV every 6 hours x24 hours, then prn                           -Reglan 10 mg every 6 hours x24 hours, then prn                           -Resume scopolamine patch x3 days (applied preop)                           -Protonix 40 mg p.o. BID while in house, then                            resume Voquenza tomorrow as inpatient. Continue                            Voquenza daily x2 weeks, then QOD x2 weeks, then                            stop                           -Decadron 8 mg every 6 hours times max 5 doses                           -Gas-X (simethicone) 4225 mg p.o. prn every 6 hours                            gas pain, abdominal discomfort                           -Tylenol 3 (APAP 120 mg/codeine 12 mg per 5 mL): 15                            mL's every 4 hours prn pain                           -Colace 100 mg p.o. twice daily if taking pain                            medications                           -Clear  liquid diet okay overnight                           -Okay to ambulate with assist around the ward                           -Please do not hesitate to contact me directly with                            any postoperative questions or concerns Procedure Code(s):        --- Professional ---                           [phone], Esophagogastroduodenoscopy, flexible,                            transoral; with insertion of guide wire followed by                            passage of dilator(s) through esophagus over guide                            wire Diagnosis Code(s):        --- Professional ---                           K21.00, Gastro-esophageal reflux disease with                            esophagitis, without bleeding                           K31.7, Polyp of stomach and duodenum                           K44.9, Diaphragmatic hernia  without obstruction or                            gangrene CPT copyright 2022 American [employer] Association. All rights reserved. The codes documented in this report are preliminary and upon coder review may  be revised to meet current compliance requirements. Doristine Locks, MD 09/03/2023 11:22:59 AM Number of Addenda: 0

## 2023-09-04 NOTE — Discharge Instructions (Addendum)
Please follow discharge & diet instructions from [facility].  Please call their office with questions regarding diet, medications, trouble with oral intake  [phone]  [facility],  LAPAROSCOPIC SURGERY: POST OP INSTRUCTIONS Always review your discharge instruction sheet given to you by the facility where your surgery was performed.   PAIN CONTROL  First take acetaminophen (Tylenol) to control your pain after surgery.  Follow directions on package.  Taking acetaminophen (Tylenol)  regularly after surgery will help to control your pain and lower the amount of prescription pain medication you may need.  You should not take more than 3,000 mg (3 grams) of acetaminophen (Tylenol) in 24 hours.  You should not take ibuprofen (Advil), aleve, motrin, naprosyn or other NSAIDS if you have a history of stomach ulcers or chronic kidney disease.  A prescription for pain medication may be given to you upon discharge.  Take your pain medication as prescribed, if you still have uncontrolled pain after taking acetaminophen (Tylenol) or ibuprofen (Advil). Use ice packs to help control pain. If you need a refill on your pain medication, please contact your pharmacy.  They will contact our office to request authorization. Prescriptions will not be filled after 5pm or on week-ends.  HOME MEDICATIONS Take your usually prescribed medications unless otherwise directed.  DIET Follow diet instructions given to you by GI medicine/Dr Cirigliano   CONSTIPATION It is common to experience some constipation after surgery and if you are taking pain medication.  Increasing fluid intake and taking a stool softener (such as Colace) will usually help or prevent this problem from occurring.  A mild laxative (Milk of Magnesia or Miralax) should be taken according to package instructions if there are no bowel movements after 48 hours.  WOUND/INCISION CARE Most patients will experience some  swelling and bruising in the area of the incisions.  Ice packs will help.  Swelling and bruising can take several days to resolve.  Unless discharge instructions indicate otherwise, follow guidelines below  STERI-STRIPS - you may remove your outer bandages 48 hours after surgery, and you may shower at that time.  You have steri-strips (small skin tapes) in place directly over the incision.  These strips should be left on the skin for 7-10 days.   DERMABOND/SKIN GLUE - you may shower in 24 hours.  The glue will flake off over the next 2-3 weeks. Any sutures or staples will be removed at the office during your follow-up visit.  ACTIVITIES You may resume regular (light) daily activities beginning the next day--such as daily self-care, walking, climbing stairs--gradually increasing activities as tolerated.  You may have sexual intercourse when it is comfortable.  Refrain from any heavy lifting or straining until approved by your doctor. You may drive when you are no longer taking prescription pain medication, you can comfortably wear a seatbelt, and you can safely maneuver your car and apply brakes.  FOLLOW-UP You should see your doctor in the office for a follow-up appointment approximately 2-3 weeks after your surgery.  You should have been given your post-op/follow-up appointment when your surgery was scheduled.  If you did not receive a post-op/follow-up appointment, make sure that you call for this appointment within a day or two after you arrive home to insure a convenient appointment time.  OTHER INSTRUCTIONS   WHEN TO CALL YOUR DOCTOR (You can also call [facility] if you are having trouble reaching [facility]): Fever over 101.0 Inability to urinate Continued bleeding from incision. Increased pain,  redness, or drainage from the incision. Increasing abdominal pain Trouble with oral intake  The clinic staff is available to answer your questions during regular business hours.  Please don't hesitate  to call and ask to speak to one of the nurses for clinical concerns.  If you have a medical emergency, go to the nearest emergency room or call 911.  A surgeon from [facility] is always on call at the hospital. [address], [address], [place], [place]  [address] ? [address] [address], [place], [place]   [address] [phone] ? [phone] ? FAX [phone] Web site: [email or web address]  [facility]  Post-Op TIF Diet and General Guidelines:  Following a special diet after surgery is necessary for healing. You may encounter "good and bad days." Often foods that go down easily one day may give you problems the next. Realize this is part of the healing process. Your diet will progress slowly from liquids to soft foods. Eating six to eight small, frequent meals per day is recommended to ensure adequate nutrition intake and prevent overeating at one meal (distention). Crush all home medications until your diet has returned to 100% regular consistency.  Tips for Tolerating Your Diet: Warm fluids prior to eating may help to lubricate/relax your throat. Introduce foods one at a time to determine tolerance. Assess if milky foods coat your throat too much. Toasted and overcooked grains may be better tolerated as they will disintegrate instead of swell. Make meats tender by slow cooking, chewing, adding tenderizer spice or over the counter papain (papaya), bromelain (pineapple) or ficain (figs) meat protein enzymes. Sit upright while eating and drinking. Remain upright 20 minutes after eating and drinking. Sip fluids slowly, avoid straws and carbonation and do not chew gum to prevent air/gas intake. When diet has progressed to solids chew well (20-30 chews per bite) and eat slowly (30 minutes per meal). Stay hydrated; Dark urine, chapped lips and white tongue are symptoms of dehydration.  Dietary Advancement:                                     Stage 1 Clear  Liquid Diet This diet begins while you are in the hospital. Room temperature and warm liquids may be best tolerated Food Allowed: Water Fruit Juices Broth Jello Decaf tea/coffee (without milk or creamer) [place] ice/popsicles  Stage 2 Full Liquids After you are discharged home and tolerating clear liquids, you may advance to thick liquids. You will remain on this diet for 2 weeks. Food Allowed: Anything from stage 1 diet Milk Thinned hot cereals (cream of wheat) Strained/blended cream soups Ice cream/sherbet/custard/pudding Yogurt (without fruit seeds) Milkshakes/frappe Fruit and vegetable juices Protein drink/supplement: Boost, Ensure, [employer], Special K, Slimfast (These can help add more protein/calories to prevent fatigue, weight loss and muscle loss with restricted diet.  Stage 3 Soft/Blended Diet After 2 weeks of Stage 2 diet, start adding soft/blended foods into your diet. Chew and blend foods well. Add one new thing at a time to assess tolerance. You will remain on this diet for 2 weeks. Foods to add into diet: Anything from Stage 1 and Stage 2 diet Moist fish Blended chicken or ham Tofu Eggs Meats mixed with mayonnaise, mustard or salad dressings Dairy including cottage cheese and soft cheese Legumes including beans, lentils, hummus, bean soup, peas-mashed easily with a fork Pureed squash, potatoes, carrots, cauliflower, green beans Applesauce and  mashed bananas Crispy foods including seedless crackers, toasted breads/bread products, overcooked pasta/rice  Stage 4 Soft Diet After 2 weeks of Stage 3 diet advance to the final stage by adding foods that are soft in consistency and easily digested. You will remain on this diet for 2 weeks. Foods to add into diet: Anything from Stage 1, Stage 2, and Stage 3 diet Thinly shaved deli meats Well chewed meat and moist poultry Shrimp, crab, imitation products Soft Cooked vegetables Seedless, skinless  /peeled fruits   Avoid dry, baked chicken, tough steak, nuts, seeds, popcorn, raw fruits and raw vegetables until 8 weeks post op from day of surgery.   POST PROCEDURE RESTRICTIONS:  WEEK 1=NO LIFTING MORE THAN 5 POUNDS.  WEEKS 2 THROUGH 4=NO LIFTING MORE THAN 25 POUNDS.

## 2023-09-04 NOTE — Progress Notes (Signed)
AVS given to Pt and husband.  All questions answered.  All belongings taken.  Pt stable at time of discharge

## 2023-09-04 NOTE — Progress Notes (Signed)
   09/04/23 0926  TOC Brief Assessment  Insurance and Status Reviewed  Patient has primary care physician Yes  Home environment has been reviewed Resides with spouse  Prior level of function: Independent at baseline  Prior/Current Home Services No current home services  Social Determinants of Health Reivew SDOH reviewed no interventions necessary  Readmission risk has been reviewed Yes  Transition of care needs no transition of care needs at this time

## 2023-09-04 NOTE — Progress Notes (Addendum)
[facility] ROUNDING NOTE   Subjective: TIF completed yesterday without complications.  Patient did well overnight without any acute events.  Tolerating liquids and p.o. medications without issue  Objective: Vital signs in last 24 hours: Temp:  [97.6 F (36.4 C)-98.3 F (36.8 C)] 98.3 F (36.8 C) (10/25 0521) Pulse Rate:  [89-98] 98 (10/25 0521) Resp:  [16-24] 18 (10/25 0521) BP: (149-178)/(79-98) 164/81 (10/25 0521) SpO2:  [93 %-100 %] 93 % (10/25 0521) Last BM Date : 09/03/23 General: NAD Lungs: CTA b/l, no w/r/r Heart: RRR, no m/r/g Abdomen: Minimal post operative TTP in epigastrium, without rebound or guarding. No peritoneal signs. Otherwise, soft, ND, +BS Ext: No c/c/e    Intake/Output from previous day: 10/24 0701 - 10/25 0700 In: 971 [I.V.:971] Out: 25 [Blood:25] Intake/Output this shift: No intake/output data recorded.   Lab Results: No results for input(s): "WBC", "HGB", "PLT", "MCV" in the last 72 hours. BMET No results for input(s): "NA", "K", "CL", "CO2", "GLUCOSE", "BUN", "CREATININE", "CALCIUM" in the last 72 hours. LFT No results for input(s): "PROT", "ALBUMIN", "AST", "ALT", "ALKPHOS", "BILITOT", "BILIDIR", "IBILI" in the last 72 hours. PT/INR No results for input(s): "INR" in the last 72 hours.    Imaging/Other results: No results found.    Assessment and Plan:  Impression and Recommendations:  Erika Hodge is a 57 y.o. female s/p EGD with Transoral Incisionless Fundoplication (TIF) completed yesterday with no events on overnight observation. Ok for d/c to home today with the following plan:   - Voquenza daily x2 weeks, then QOD x2 weeks, then stop - Discharge with Zofran 4 mg PO prn Q6 hours for nausea  - Discharge with Reglan 10 mg PO prn Q6 hours for nausea  - Discharge with Simethicone 80 mg PO prn Q6 hours for bloating/abdominal discomfort   Diet:  - 2 weeks of liquid soft foods followed by 4 weeks slowly progressive  diet back to regular  - Provided with handout for post operative diet plan   Post Op Activity:  - Week 1: encourage short distance walking, minimal physical activity, no lifting >5 lbs  - Week 2: Slow climbing stairs, no intense exercise, no lifting >5 lbs  - Week 3-6: No intense exercise, may lift up to 25 lbs  - Week 7: Resume normal activity   - To follow-up with Dr. Santa Lighter on 09/17/23 at 11:20 in the [facility]  - Sincerely appreciate the assistance by the Hospital service in the admission and overnight observation of this patient.   Doree Albee, DO  09/04/2023, 8:41 AM [facility]    Attending physician's note   I have reviewed the chart and discussed her care with [facility] today. I performed a substantive portion of this encounter, including complete performance of at least one of the key components, in conjunction with the APP. I agree with the APP's note, impression, and recommendations with my edits. Ok for discharge home and follow-up with me in the GI clinic.  [address], DO, FACG [phone] office

## 2023-09-04 NOTE — Progress Notes (Signed)
1 Day Post-Op   Subjective/Chief Complaint: Taking meds in applesauce but liquids going well No dysphagia Min nausea Wants to go home   Objective: Vital signs in last 24 hours: Temp:  [97.6 F (36.4 C)-98.3 F (36.8 C)] 98.3 F (36.8 C) (10/25 0521) Pulse Rate:  [89-98] 98 (10/25 0521) Resp:  [16-24] 18 (10/25 0521) BP: (149-178)/(79-98) 164/81 (10/25 0521) SpO2:  [93 %-100 %] 93 % (10/25 0521) Last BM Date : 09/03/23  Intake/Output from previous day: 10/24 0701 - 10/25 0700 In: 971 [I.V.:971] Out: 25 [Blood:25] Intake/Output this shift: No intake/output data recorded.  Alert, nontoxic Soft spoken/weak voice Soft, nd, mild expected TTP, incisions ok  Lab Results:  No results for input(s): "WBC", "HGB", "HCT", "PLT" in the last 72 hours. BMET No results for input(s): "NA", "K", "CL", "CO2", "GLUCOSE", "BUN", "CREATININE", "CALCIUM" in the last 72 hours. PT/INR No results for input(s): "LABPROT", "INR" in the last 72 hours. ABG No results for input(s): "PHART", "HCO3" in the last 72 hours.  Invalid input(s): "PCO2", "PO2"  Studies/Results: No results found.  Anti-infectives: Anti-infectives (From admission, onward)    Start     Dose/Rate Route Frequency Ordered Stop   09/03/23 0800  ceFAZolin (ANCEF) IVPB 2g/100 mL premix        2 g 200 mL/hr over 30 Minutes Intravenous  Once 09/03/23 0758     09/03/23 0745  clindamycin (CLEOCIN) IVPB 900 mg        900 mg 100 mL/hr over 30 Minutes Intravenous  Once 09/03/23 0736 09/03/23 0858       Assessment/Plan: s/p Procedure(s) with comments: LAPAROSCOPIC REPAIR OF SLIDING HIATAL HERNIA (N/A) - 60 MIN TRANSORAL INCISIONLESS FUNDOPLICATION (N/A) ESOPHAGOGASTRODUODENOSCOPY (EGD) (N/A) SAVORY DILATION  Doing well No tachy/fever Tolerating liquids Ok for dc from my point of view Discussed expectations, pain at Rt trocar site, diet expectations.  Pt to go out on FLD  LOS: 0 days    Gaynelle Adu 09/04/2023

## 2023-09-07 ENCOUNTER — Telehealth: Payer: Self-pay | Admitting: Gastroenterology

## 2023-09-07 NOTE — Telephone Encounter (Signed)
Patient advised that she should remain on full liquid diet x 2 weeks and should not advance any sooner. She does confirm that she has access to post TIF diet instructions. Advised that soft drinks often cause more gas/eructation so we typically advise against those initially. Patient asks what she can take for throat pain since she does not want to take pain meds. Advised she may use chloraseptic spray temporarily, suck on throat lozenges and take liquid tylenol if needed. Patient verbalizes understanding.

## 2023-09-07 NOTE — Telephone Encounter (Signed)
Attempted to reach patient x 2. No answer and phone eventually leads to busy signal. Will reach out again at a later time.

## 2023-09-07 NOTE — Telephone Encounter (Signed)
Inbound call from patient, states he throat is in pain, she states she was told at the hospital she could drink ginger ale but then looked on her paperwork that said no carbonated drinks. Patient would like to discuss what she can take for pain and also food.

## 2023-09-09 NOTE — Telephone Encounter (Signed)
Patient calling back states bot are shoulders are still hurting, also would like to know if she can eat raisins. Please advise.

## 2023-09-09 NOTE — Telephone Encounter (Signed)
Left message for patient to call back  

## 2023-09-09 NOTE — Telephone Encounter (Signed)
Patient c/o continued slight shoulder discomfort following cTIF. States that it is nowhere near as bad as directly following surgery, but is still present. I have advised that patient that some shoulder discomfort is expected for a week or so following cTIF and can be related to the gas placed into the esophagus prior to the procedure or from irritation of the phrenic nerve which extends into the shoulder area and serves to control the diaphragm. Advised patient that she may take OTC Tylenol for the discomfort and can try using the heating pad periodically to the shoulders. Advised her her symptoms worsen or fail to improve over the next several days, she can let us know.  Advised no raisins for now. Patient is 6 days out from cTIF and should remain on stage 2 full liquid diet for a full 2 weeks before advancing her diet any further. Patient verbalizes understanding. She does states that she sleep walks and did find that apparently she ate some ritz crackers last night. No current pain in relation to this.

## 2023-09-10 ENCOUNTER — Ambulatory Visit: Payer: BC Managed Care – PPO | Admitting: Gastroenterology

## 2023-09-17 ENCOUNTER — Ambulatory Visit: Payer: BC Managed Care – PPO | Admitting: Gastroenterology

## 2023-09-17 ENCOUNTER — Encounter: Payer: Self-pay | Admitting: Gastroenterology

## 2023-09-17 VITALS — BP 160/100 | HR 104 | Ht 63.0 in | Wt 175.4 lb

## 2023-09-17 DIAGNOSIS — G8918 Other acute postprocedural pain: Secondary | ICD-10-CM

## 2023-09-17 DIAGNOSIS — Z8719 Personal history of other diseases of the digestive system: Secondary | ICD-10-CM

## 2023-09-17 DIAGNOSIS — Z9889 Other specified postprocedural states: Secondary | ICD-10-CM

## 2023-09-17 DIAGNOSIS — K2100 Gastro-esophageal reflux disease with esophagitis, without bleeding: Secondary | ICD-10-CM

## 2023-09-17 MED ORDER — OXYCODONE-ACETAMINOPHEN 10-325 MG PO TABS: 1.0000 | ORAL_TABLET | Freq: Four times a day (QID) | ORAL | 0 refills | Status: AC | PRN

## 2023-09-17 MED ORDER — OXYCODONE-ACETAMINOPHEN 5-325 MG PO TABS: 1.0000 | ORAL_TABLET | Freq: Four times a day (QID) | ORAL | Status: AC | PRN

## 2023-09-17 MED ORDER — PANTOPRAZOLE SODIUM 40 MG PO TBEC: DELAYED_RELEASE_TABLET | ORAL | Status: DC

## 2023-09-17 NOTE — Patient Instructions (Addendum)
_______________________________________________________  If your blood pressure at your visit was 140/90 or greater, please contact your primary care physician to follow up on this.  _______________________________________________________  If you are age 56 or older, your body mass index should be between 23-30. Your Body mass index is 31.07 kg/m. If this is out of the aforementioned range listed, please consider follow up with your Primary Care Provider.  If you are age 93 or younger, your body mass index should be between 19-25. Your Body mass index is 31.07 kg/m. If this is out of the aformentioned range listed, please consider follow up with your Primary Care Provider.   ________________________________________________________  The [facility] providers would like to encourage you to use [facility] to communicate with providers for non-urgent requests or questions.  Due to long hold times on the telephone, sending your provider a message by [facility] ([place]) may be a faster and more efficient way to get a response.  Please allow 48 business hours for a response.  Please remember that this is for non-urgent requests.  _______________________________________________________  Protonix 40mg : Take 1 tablet daily for 2 weeks, then every other day for 1 week, then as needed.  We will see you in the office in 6 months for follow up.  We will contact you to schedule this appointment.   It was a pleasure to see you today!  Vito Cirigliano, D.O.

## 2023-09-17 NOTE — Progress Notes (Signed)
Chief Complaint:      GI History: 57 y.o. female with a history of breast cancer, initially referred to me by Dr. Elnoria Howard for evaluation of antireflux surgery.  Longstanding history of GERD complicated by erosive esophagitis and hiatal hernia with breakthrough symptoms despite multiple acid suppression medications, including Voquenza 20 mg daily.  GERD history: -Index symptoms: Heartburn, regurgitation.  Intermittent dysphagia, nausea/vomiting -Exacerbating features: Overeating -Medications trialed: Prilosec, Nexium, Tums -Current medications: Voquenza 20 mg daily -Complications: Hiatal hernia, EE   GERD evaluation: -Last EGD: 02/12/2023 -Barium esophagram: Ordered today -Esophageal Manometry: None -pH/Impedance: None -Bravo: None   Endoscopic History: - 11/19/2015: EGD (Dr. Loreta Ave): LA Grade C esophagitis, longitudinal furrows in the middle/lower esophagus with EOE score 1 (path: Normal, no elevated eosinophils).  Medium size hiatal hernia.  Moderate gastritis, normal duodenum - 04/07/2018: Colonoscopy (Dr. Loreta Ave): 2 sigmoid HP's, and 2 proximal ascending colon adenomas, 7 mm mid sigmoid polyp removed with hot snare (path: HP).  Normal TI.  Repeat in 5 years - 02/12/2023: EGD (Dr. Elnoria Howard): LA Grade D esophagitis, 3 cm HH.  Hill grade 2 valve.  Normal stomach and duodenum.  Started on Voquenza - 09/03/2023: Concomitant laparoscopic hiatal hernia repair and TIF  HPI:     Patient is a 57 y.o. female presenting [facility] for post-op follow-up after concomitant laparoscopic hiatal hernia repair and TIF on 09/03/2023.   Has done well since surgery.  Has had some dietary indiscretions, including eating some small solid foods, carbonated beverages, eating at bedtime.  Does report some pulling/tugging sensation in her upper abdomen along with intermittent left shoulder blade pain.  Tends to be worse after dietary indiscretions.  Otherwise tolerating liquids without issue.  Still  taking pantoprazole 40 mg twice daily as prescribed.  Is requesting refill of pain medication in case return of symptoms.   Review of systems:     No chest pain, no SOB, no fevers, no urinary sx   Past Medical History:  Diagnosis Date   Allergic rhinitis 12/02/2021   Anemia    Anemia of chronic disease 04/08/2022   Anxiety    Bacterial vaginosis 05/25/2023   Bronchiectasis without complication (HCC) 04/07/2022   Cancer (HCC)    Chest pain    Complication of anesthesia    HA  One eye dosent close during and is dry and gets scratched has been to eye doctor 2 times after anesthesia for this   Depression    Elevated cholesterol    Family history of breast cancer 11/29/2021   Gastro-esophageal reflux disease without esophagitis 12/02/2021   Genetic testing 12/11/2021   [facility] was Negative. Report date is 12/11/2021.     The [facility]Next gene panel offered by 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     BIOTIN PO Take 1 tablet by mouth daily.     FIBER PO Take 1 tablet by mouth daily.     FLUoxetine (PROZAC) 40 MG capsule Take 40 mg by mouth daily.     gabapentin (NEURONTIN) 100 MG capsule Take 1 capsule by mouth daily at bedtime, can slowly titrate up to 1 capsule three (3) times daily if tolerated 60 capsule 0   ondansetron (ZOFRAN) 4 MG tablet Take 1 tablet (4 mg total) by mouth every 8 (eight) hours as needed for nausea or vomiting. 20 tablet 0   pantoprazole (PROTONIX) 40 MG tablet Take 1 tablet (40 mg total) by mouth 2 (two) times daily. 60 tablet 0   Prenatal Vit-Fe Fumarate-FA (PRENATAL MULTIVITAMIN) TABS tablet Take 1 tablet by mouth daily at 12 noon.     rOPINIRole (REQUIP) 0.25 MG tablet Take 0.5 mg by mouth at bedtime.     simethicone (MYLICON) 80 MG chewable tablet Chew 1 tablet (80 mg total) by mouth every 6 (six) hours as needed for flatulence (gas, bloating, abdominal discomfort). 30 tablet 0   zolpidem (AMBIEN) 10 MG tablet Take 10 mg by mouth at bedtime.     albuterol (VENTOLIN HFA) 108 (90 Base) MCG/ACT inhaler Inhale 2 puffs into the lungs every 6 (six) hours as needed for wheezing or shortness of breath. (Patient not taking: Reported on 09/17/2023)     anastrozole (ARIMIDEX) 1 MG tablet Take 1 tablet (1 mg total) by mouth daily. (Patient not taking: Reported on 09/17/2023) 30 tablet 3   fluticasone (FLONASE) 50 MCG/ACT nasal spray Place 1-2 sprays into both nostrils daily as needed for allergies or rhinitis. (Patient not taking: Reported on 09/17/2023)     lisdexamfetamine (VYVANSE) 40 MG capsule Take 40 mg by mouth daily as needed (concentration). (Patient not taking: Reported on 09/17/2023)     loratadine-pseudoephedrine (CLARITIN-D 24-HOUR) 10-240 MG 24 hr tablet Take 1 tablet by mouth daily. (Patient not taking: Reported on 09/17/2023)      rosuvastatin (CRESTOR) 5 MG tablet Take 5 mg by mouth daily. (Patient not taking: Reported on 09/17/2023)     Tapinarof (VTAMA) 1 % CREA Apply 1 Application topically daily. (Patient not taking: Reported on 09/17/2023)     TURMERIC PO Take 1 capsule by mouth daily. (Patient not taking: Reported on 09/17/2023)     No current facility-administered medications for this visit.    Physical Exam:     BP (!) 160/100 (BP Location: Left Arm, Patient Position: Sitting, Cuff Size: Normal)   Pulse (!) 104   Ht 5\' 3"  (1.6 m) Comment: height measured without shoes  Wt 175 lb 6 oz (79.5 kg)   BMI 31.07 kg/m   GENERAL:  Pleasant female in NAD PSYCH: : Cooperative, normal affect Musculoskeletal:  Normal muscle tone, normal strength NEURO: Alert and oriented x 3, no focal neurologic deficits   IMPRESSION and PLAN:    1) GERD with erosive esophagitis 2) Hiatal hernia - Corrected with laparoscopic hernia repair 3) s/p TIF Doing well postoperatively, but does have dietary indiscretions which have led to some pain/discomfort.  No breakthrough  reflux symptoms fortunately.  Counseled extensively today on adhering to the postoperative diet as outlined.  Discussed risk of suture disruption and breakthrough reflux if advancing diet too quickly.  - Can start weaning pantoprazole to 40 mg daily x 2 weeks, then every other day x 1 week, then prn - Provided with Rx for oxycodone for postoperative pain, #10 - Slowly liberalize diet according to postoperative protocol and as body tolerates - She very much wants to drink ginger ale.  Told her to let this sit out for a while and drink as a "flat" soda without carbonation - Continue advancing activity as tolerated and per postoperative protocol - Has follow-up with Dr. Andrey Campanile later this month - Can otherwise follow-up with me in 6 months or sooner as needed.  Will otherwise continue following with Dr. Elnoria Howard for her other GI issues and polyp surveillance.            Shellia Cleverly ,DO, FACG 09/17/2023, 11:23 AM

## 2023-09-17 NOTE — Addendum Note (Signed)
Addended by: Shellia Cleverly on: 09/17/2023 01:27 PM   Modules accepted: Orders

## 2023-09-21 ENCOUNTER — Other Ambulatory Visit: Payer: Self-pay | Admitting: Nurse Practitioner

## 2023-09-22 ENCOUNTER — Other Ambulatory Visit: Payer: Self-pay | Admitting: Nurse Practitioner

## 2023-09-22 DIAGNOSIS — C50911 Malignant neoplasm of unspecified site of right female breast: Secondary | ICD-10-CM

## 2023-09-22 DIAGNOSIS — C50212 Malignant neoplasm of upper-inner quadrant of left female breast: Secondary | ICD-10-CM

## 2023-09-29 ENCOUNTER — Encounter: Payer: Self-pay | Admitting: Hematology

## 2023-09-29 ENCOUNTER — Inpatient Hospital Stay: Payer: BC Managed Care – PPO | Attending: Hematology

## 2023-09-29 ENCOUNTER — Inpatient Hospital Stay: Payer: BC Managed Care – PPO | Admitting: Hematology

## 2023-09-29 VITALS — BP 150/90 | HR 99 | Temp 98.2°F | Resp 18 | Wt 178.6 lb

## 2023-09-29 DIAGNOSIS — D649 Anemia, unspecified: Secondary | ICD-10-CM

## 2023-09-29 DIAGNOSIS — E2839 Other primary ovarian failure: Secondary | ICD-10-CM

## 2023-09-29 DIAGNOSIS — C50212 Malignant neoplasm of upper-inner quadrant of left female breast: Secondary | ICD-10-CM | POA: Diagnosis not present

## 2023-09-29 DIAGNOSIS — C50911 Malignant neoplasm of unspecified site of right female breast: Secondary | ICD-10-CM

## 2023-09-29 DIAGNOSIS — Z79811 Long term (current) use of aromatase inhibitors: Secondary | ICD-10-CM | POA: Insufficient documentation

## 2023-09-29 DIAGNOSIS — Z170 Estrogen receptor positive status [ER+]: Secondary | ICD-10-CM | POA: Diagnosis not present

## 2023-09-29 DIAGNOSIS — C50919 Malignant neoplasm of unspecified site of unspecified female breast: Secondary | ICD-10-CM

## 2023-09-29 LAB — CBC WITH DIFFERENTIAL (CANCER CENTER ONLY)
Abs Immature Granulocytes: 0.02 10*3/uL (ref 0.00–0.07)
Basophils Absolute: 0.1 10*3/uL (ref 0.0–0.1)
Basophils Relative: 1 %
Eosinophils Absolute: 0.2 10*3/uL (ref 0.0–0.5)
Eosinophils Relative: 4 %
HCT: 41 % (ref 36.0–46.0)
Hemoglobin: 12.8 g/dL (ref 12.0–15.0)
Immature Granulocytes: 0 %
Lymphocytes Relative: 20 %
Lymphs Abs: 1.2 10*3/uL (ref 0.7–4.0)
MCH: 29.8 pg (ref 26.0–34.0)
MCHC: 31.2 g/dL (ref 30.0–36.0)
MCV: 95.6 fL (ref 80.0–100.0)
Monocytes Absolute: 0.7 10*3/uL (ref 0.1–1.0)
Monocytes Relative: 12 %
Neutro Abs: 3.7 10*3/uL (ref 1.7–7.7)
Neutrophils Relative %: 63 %
Platelet Count: 372 10*3/uL (ref 150–400)
RBC: 4.29 MIL/uL (ref 3.87–5.11)
RDW: 13.7 % (ref 11.5–15.5)
WBC Count: 5.8 10*3/uL (ref 4.0–10.5)
nRBC: 0 % (ref 0.0–0.2)

## 2023-09-29 LAB — CMP (CANCER CENTER ONLY)
ALT: 36 U/L (ref 0–44)
AST: 33 U/L (ref 15–41)
Albumin: 4.3 g/dL (ref 3.5–5.0)
Alkaline Phosphatase: 75 U/L (ref 38–126)
Anion gap: 6 (ref 5–15)
BUN: 18 mg/dL (ref 6–20)
CO2: 29 mmol/L (ref 22–32)
Calcium: 9.6 mg/dL (ref 8.9–10.3)
Chloride: 102 mmol/L (ref 98–111)
Creatinine: 0.82 mg/dL (ref 0.44–1.00)
GFR, Estimated: >60 mL/min (ref >60)
Glucose, Bld: 122 mg/dL — ABNORMAL HIGH (ref 70–99)
Potassium: 4 mmol/L (ref 3.5–5.1)
Sodium: 137 mmol/L (ref 135–145)
Total Bilirubin: 0.5 mg/dL (ref <1.2)
Total Protein: 7.2 g/dL (ref 6.5–8.1)

## 2023-09-29 LAB — IRON AND IRON BINDING CAPACITY (CC-WL,HP ONLY)
Iron: 83 ug/dL (ref 28–170)
Saturation Ratios: 20 % (ref 10.4–31.8)
TIBC: 416 ug/dL (ref 250–450)
UIBC: 333 ug/dL (ref 148–442)

## 2023-09-29 LAB — FERRITIN: Ferritin: 19 ng/mL (ref 11–307)

## 2023-09-29 NOTE — Progress Notes (Signed)
[facility]   [phone] [phone]   Clinic Follow up Note   Patient Care Team: Galvin Proffer, MD as PCP - General (Internal Medicine) Harriette Bouillon, MD as Consulting Physician (General Surgery) Malachy Mood, MD as Consulting Physician (Hematology) Antony Blackbird, MD as Consulting Physician (Radiation Oncology) Shellia Cleverly, DO as Consulting Physician (Gastroenterology)  Date of Service:  09/29/2023  CHIEF COMPLAINT: f/u of bilateral breast cancer   CURRENT THERAPY:  Anastrozole   Oncology History   Malignant neoplasm of upper-inner quadrant of left breast in female, estrogen receptor positive (HCC) -found on screening mammogram. 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 -she opted to proceed with b/l mastectomies with reconstruction on 01/07/22 with Dr. Luisa Hart and Dr. Leta Baptist. Pathology revealed b/l breast cancers. Left breast showed 2.1 cm invasive and in situ ductal carcinoma. Margins and lymph nodes negative (0/2). Repeated ER/PR/HER2 were all negative (ER 10% weak (+) on biopsy) -she developed left IMF necrotic tissue, excised by Dr. Leta Baptist on 01/27/22. -given prior treatment with adriamycin, she completed 4 cycles TC 02/12/22 - 06/06/22, tolerated fairly  -we reviewed that adjuvant radiation is not recommended due to her previous radiation for lymphoma. -She started adjuvant anastrozole in July 2024, with significant hot flashes.   Assessment and Plan    Bilateral Breast Cancer Completed chemotherapy in July 2023, currently on anastrozole with no signs of recurrence on recent imaging. Discussed anxiety about recurrence and the option of a PET scan for reassurance, though not standard practice. Explained PET scans are not routinely done but can be ordered due to her concern of recent back pain. Discussed Signatera blood test for circulating tumor DNA as a more sensitive option for early detection. Explained that a positive  blood test with a negative PET scan will not alter treatment due to lack of guiding evidence. - Order PET scan - Order Signatera blood test - Continue anastrozole - Schedule follow-up in 4 months  Mid-Back Pain Intermittent mid-back pain for three weeks. 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 Pain likely related to arthritis. Discussed using heating pads, topical analgesics, and considering physical therapy or exercise. - Use heating pad - Apply topical analgesics - Consider physical therapy or exercise  Post-Surgical Recovery (Hiatal Hernia Repair) Recently underwent hiatal hernia repair. Initially had difficulty with solid foods but now tolerating a broader range. No complications reported. - Continue gradual reintroduction of solid foods - Chew food thoroughly and eat slowly  General Health Maintenance Discussed need for bone density scan due to postmenopausal status and osteoporosis risk. Blood work shows normal kidney and liver function, and normal iron levels. - Order bone density scan - Continue vitamin D supplementation as needed  Follow-up - Schedule follow-up appointment in 4 months.  -PET in 2-3 weeks -Signatera with next visit  -routine DEXA in 2-3 months         SUMMARY OF ONCOLOGIC HISTORY: Oncology History Overview Note   Cancer Staging  Invasive lobular carcinoma of right breast, stage 1 (HCC) Staging form: Breast, AJCC 8th Edition - Clinical stage from 01/07/2022: Stage Unknown (cT1c, cNX, cM0, G1, ER+, PR+, HER2-) - Signed by Malachy Mood, MD on 01/24/2022  Malignant neoplasm of upper-inner quadrant of left breast in female, estrogen receptor positive (HCC) Staging form: Breast, AJCC 8th Edition - Clinical stage from 11/20/2021: Stage IA (cT1c, cN0, cM0, G2, ER+, PR-, HER2-) - Signed by Malachy Mood, MD on  11/26/2021 - Pathologic stage from 01/07/2022: Stage IIA (pT2, pN0, cM0, G2, ER-, PR-, HER2-) -  Signed by Malachy Mood, MD on 01/24/2022     Malignant neoplasm of upper-inner quadrant of left breast in female, estrogen receptor positive (HCC)  11/05/2021 Mammogram   CLINICAL DATA:  Screening recall for a possible left breast asymmetry.   EXAM: DIGITAL DIAGNOSTIC UNILATERAL LEFT MAMMOGRAM WITH TOMOSYNTHESIS AND CAD; ULTRASOUND LEFT BREAST LIMITED  IMPRESSION: 1. Irregular 1.2 cm mass in the left breast at 11 o'clock, 2 cm the nipple, suspicious for malignancy.   11/20/2021 Cancer Staging   Staging form: Breast, AJCC 8th Edition - Clinical stage from 11/20/2021: Stage IA (cT1c, cN0, cM0, G2, ER+, PR-, HER2-) - Signed by Malachy Mood, MD on 11/26/2021 Stage prefix: Initial diagnosis Histologic grading system: 3 grade system   11/20/2021 Initial Biopsy   Diagnosis Breast, left, needle core biopsy, 11 o'clock, ribbon clip - INVASIVE DUCTAL CARCINOMA - DUCTAL CARCINOMA IN SITU - SEE COMMENT Microscopic Comment based on the biopsy, the carcinoma appears Nottingham grade 2 of 3 and measures 0.6 cm in greatest linear extent.  PROGNOSTIC INDICATORS Results: The tumor cells are NEGATIVE for Her2 (1+). Estrogen Receptor: 10%, POSITIVE, WEAK STAINING INTENSITY Progesterone Receptor: 0%, NEGATIVE Proliferation Marker Ki67: 20%   11/26/2021 Initial Diagnosis   Malignant neoplasm of upper-inner quadrant of left breast in female, estrogen receptor positive (HCC)   11/27/2021 Genetic Testing   [employer] was Negative. Report date is 12/11/2021.  The CancerNext gene panel offered by [employer] includes sequencing, rearrangement analysis, and RNA analysis for the following 36 genes:   APC, ATM, AXIN2, BARD1, BMPR1A, BRCA1, BRCA2, BRIP1, CDH1, CDK4, CDKN2A, CHEK2, DICER1, HOXB13, EPCAM, GREM1, MLH1, MSH2, MSH3, MSH6, MUTYH, NBN, NF1, NTHL1, PALB2, PMS2, POLD1, POLE, PTEN, RAD51C, RAD51D, RECQL, SMAD4, SMARCA4, STK11, and TP53.    01/07/2022 Cancer Staging   Staging form: Breast, AJCC 8th  Edition - Pathologic stage from 01/07/2022: Stage IIA (pT2, pN0, cM0, G2, ER-, PR-, HER2-) - Signed by Malachy Mood, MD on 01/24/2022 Stage prefix: Initial diagnosis Histologic grading system: 3 grade system Residual tumor (R): R0 - None   01/07/2022 Definitive Surgery   FINAL MICROSCOPIC DIAGNOSIS:   A. BREAST, RIGHT NIPPLE, BIOPSY:  - Benign breast tissue.  - No malignancy identified.   B. BREAST, LEFT NIPPLE, BIOPSY:  - Benign breast tissue.  - No malignancy identified.   C. BREAST, RIGHT, MASTECTOMY:  - Invasive lobular carcinoma, 1.1 cm.  - Ductal carcinoma in situ, 1 cm.  - Invasive carcinoma focally extends to the anterior margin.  - Fibrocystic changes with usual ductal hyperplasia and calcifications.  - Sclerosing adenosis and fibroadenomatoid change.  - See oncology table and comment.   D. LYMPH NODE, LEFT AXILLARY, SENTINEL, EXCISION:  - One lymph node negative for metastatic carcinoma (0/1).   E. LYMPH NODE, LEFT AXILLARY, SENTINEL, EXCISION:  - One lymph node negative for metastatic carcinoma (0/1).   F. BREAST, LEFT, MASTECTOMY:  - Invasive and in situ ductal carcinoma, 2.1 cm.  - Margins negative for carcinoma.  - See oncology table.    01/07/2022 Receptors her2   F1. PROGNOSTIC INDICATOR RESULTS:   The tumor cells are NEGATIVE for Her2 (0).   Estrogen Receptor: NEGATIVE  Progesterone Receptor: NEGATIVE  Proliferation Marker Ki-67: 10%    02/12/2022 - 06/06/2022 Chemotherapy   Patient is on Treatment Plan : BREAST TC q21d     08/2022 -  Anti-estrogen oral therapy   Tamoxifen  Invasive lobular carcinoma of right breast, stage 1 (HCC)  11/27/2021 Genetic Testing   [employer] was Negative. Report date is 12/11/2021.  The CancerNext gene panel offered by [employer] includes sequencing, rearrangement analysis, and RNA analysis for the following 36 genes:   APC, ATM, AXIN2, BARD1, BMPR1A, BRCA1, BRCA2, BRIP1, CDH1, CDK4, CDKN2A, CHEK2, DICER1, HOXB13,  EPCAM, GREM1, MLH1, MSH2, MSH3, MSH6, MUTYH, NBN, NF1, NTHL1, PALB2, PMS2, POLD1, POLE, PTEN, RAD51C, RAD51D, RECQL, SMAD4, SMARCA4, STK11, and TP53.    01/07/2022 Cancer Staging   Staging form: Breast, AJCC 8th Edition - Clinical stage from 01/07/2022: Stage Unknown (cT1c, cNX, cM0, G1, ER+, PR+, HER2-) - Signed by Malachy Mood, MD on 01/24/2022 Stage prefix: Initial diagnosis Histologic grading system: 3 grade system   01/07/2022 Definitive Surgery   FINAL MICROSCOPIC DIAGNOSIS:   A. BREAST, RIGHT NIPPLE, BIOPSY:  - Benign breast tissue.  - No malignancy identified.   B. BREAST, LEFT NIPPLE, BIOPSY:  - Benign breast tissue.  - No malignancy identified.   C. BREAST, RIGHT, MASTECTOMY:  - Invasive lobular carcinoma, 1.1 cm.  - Ductal carcinoma in situ, 1 cm.  - Invasive carcinoma focally extends to the anterior margin.  - Fibrocystic changes with usual ductal hyperplasia and calcifications.  - Sclerosing adenosis and fibroadenomatoid change.  - See oncology table and comment.   D. LYMPH NODE, LEFT AXILLARY, SENTINEL, EXCISION:  - One lymph node negative for metastatic carcinoma (0/1).   E. LYMPH NODE, LEFT AXILLARY, SENTINEL, EXCISION:  - One lymph node negative for metastatic carcinoma (0/1).   F. BREAST, LEFT, MASTECTOMY:  - Invasive and in situ ductal carcinoma, 2.1 cm.  - Margins negative for carcinoma.  - See oncology table.    01/07/2022 Receptors her2   C7.  PROGNOSTIC INDICATOR RESULTS:   The tumor cells are NEGATIVE for Her2 (1+).   Estrogen Receptor: POSITIVE, 30% WEAK STAINING INTENSITY  Progesterone Receptor: POSITIVE, 80% MODERATE STAINING INTENSITY  Proliferation Marker Ki-67: <5%    01/24/2022 Initial Diagnosis   Invasive lobular carcinoma of right breast, stage 1 (HCC)   08/2022 -  Anti-estrogen oral therapy   Tamoxifen      Discussed the use of AI scribe software for clinical note transcription with the patient, who gave verbal consent to  proceed.  History of Present Illness   The patient, a 57 year old female with a history of bilateral breast cancer, presents for follow-up. She recently underwent surgery for a hiatal hernia, which has resulted in some weight changes. She reports that she has been consuming a lot of sugar, as foods like ice cream and Jell-O were easy to consume post-surgery. She is now able to eat meat.  She has been experiencing back pain in the middle of her back, which comes and goes. This has been ongoing for about three weeks. She has a history of an injury in the same area, which has been causing her pain since the incident.  She is currently on Anastrozole and Gabapentin, the latter of which has been helping with hot flashes. She is also scheduled for Mohs surgery for a lesion on her leg and needs dental work. She expresses anxiety about the possibility of cancer recurrence and requests a PET scan for reassurance.         All other systems were reviewed with the patient and are negative.  MEDICAL HISTORY:  Past Medical History:  Diagnosis Date   Allergic rhinitis 12/02/2021   Anemia    Anemia of chronic disease  04/08/2022   Anxiety    Bacterial vaginosis 05/25/2023   Bronchiectasis without complication (HCC) 04/07/2022   Cancer (HCC)    Chest pain    Complication of anesthesia    HA  One eye dosent close during and is dry and gets scratched has been to eye doctor 2 times after anesthesia for this   Depression    Elevated cholesterol    Family history of breast cancer 11/29/2021   Gastro-esophageal reflux disease without esophagitis 12/02/2021   Genetic testing 12/11/2021   [employer] was Negative. Report date is 12/11/2021.     The CancerNext gene panel offered by [employer] includes sequencing, rearrangement analysis, and RNA analysis for the following 36 genes:   APC, ATM, AXIN2, BARD1, BMPR1A, BRCA1, BRCA2, BRIP1, CDH1, CDK4, CDKN2A, CHEK2, DICER1, HOXB13, EPCAM, GREM1, MLH1, MSH2,  MSH3, MSH6, MUTYH, NBN, NF1, NTHL1, PALB2, PMS2, POLD1, POLE, PTEN, RAD51   GERD (gastroesophageal reflux disease)    Hiatal hernia with GERD    History of therapeutic radiation 12/02/2021   Hypokalemia 04/08/2022   Invasive lobular carcinoma of right breast, stage 1 (HCC) 01/24/2022   Jaw pain    Malignant neoplasm of upper-inner quadrant of left breast in female, estrogen receptor positive (HCC) 11/26/2021   Triple negative   Menorrhagia 05/25/2023   Neutropenic fever (HCC) 04/07/2022   Non Hodgkin's lymphoma (HCC)    Obesity (BMI 30-39.9) 04/08/2022   Psoriasis    Pulmonary nodules 04/07/2022   Restless leg syndrome 04/08/2022   Vitamin D deficiency 05/25/2023    SURGICAL HISTORY: Past Surgical History:  Procedure Laterality Date   BREAST BIOPSY Left 11/20/2021   BREAST RECONSTRUCTION WITH PLACEMENT OF TISSUE EXPANDER AND ALLODERM Bilateral 01/07/2022   Procedure: BILATERAL BREAST RECONSTRUCTION WITH PLACEMENT OF TISSUE EXPANDERS AND ALLODERM;  Surgeon: Glenna Fellows, MD;  Location: [facility];  Service: Plastics;  Laterality: Bilateral;   COLONOSCOPY  04/07/2018   [facility]. Dr. Charna Elizabeth. Four small polyps, 2 in the sigmoid colon and 2 in teh proximal ascending colon - removed by cold biopsies. One 7 mm sessile polyps in the mid sigmoid colon, removed with a hot snare x 1; resected and retrived. The examined portion of the ileum was normal. The ecamination was otherwise normal on direct and retroflexion views.   ESOPHAGOGASTRODUODENOSCOPY  11/19/2015   [facility]. Dr. Charna Elizabeth. LA Grade C reflux esophagitis. Esophageal mucosal changes suggestive of Eosinophilic esophagitis noted in the distal 2/3 of the esophagus-biopsies done. Medium-sized hiatal hernia noted on retroplexion. Moderate diffuse gastritis. Normal examined duodenum.   ESOPHAGOGASTRODUODENOSCOPY  02/12/2023   [facility]. Dr. Jeani Hawking.  LA Grade D  reflux esophagitis with no bleeding. 3 cm hiatal hernia. Gastrophageal flap valve classified as Hill Grade II ( fold present, opens with respiration). Normal stomach. Normal examined duodenum. No specimens collected.   ESOPHAGOGASTRODUODENOSCOPY N/A 09/03/2023   Procedure: ESOPHAGOGASTRODUODENOSCOPY (EGD);  Surgeon: Shellia Cleverly, DO;  Location: [facility];  Service: Gastroenterology;  Laterality: N/A;   HIATAL HERNIA REPAIR N/A 09/03/2023   Procedure: LAPAROSCOPIC REPAIR OF SLIDING HIATAL HERNIA;  Surgeon: Gaynelle Adu, MD;  Location: [facility];  Service: General;  Laterality: N/A;  60 MIN   NIPPLE SPARING MASTECTOMY Right 01/07/2022   Procedure: RIGHT NIPPLE SPARING MASTECTOMY;  Surgeon: Harriette Bouillon, MD;  Location: [facility];  Service: General;  Laterality: Right;   NIPPLE SPARING MASTECTOMY WITH SENTINEL LYMPH NODE BIOPSY Left 01/07/2022   Procedure: LEFT NIPPLE SPARING MASTECTOMY  WITH SENTINEL LYMPH NODE BIOPSY;  Surgeon: Harriette Bouillon, MD;  Location: [facility];  Service: General;  Laterality: Left;   REMOVAL OF BILATERAL TISSUE EXPANDERS WITH PLACEMENT OF BILATERAL BREAST IMPLANTS Bilateral 07/15/2022   Procedure: REMOVAL OF BILATERAL TISSUE EXPANDERS WITH PLACEMENT OF BILATERAL BREAST IMPLANTS;  Surgeon: Glenna Fellows, MD;  Location: [facility];  Service: Plastics;  Laterality: Bilateral;   SAVORY DILATION  09/03/2023   Procedure: SAVORY DILATION;  Surgeon: Shellia Cleverly, DO;  Location: [facility];  Service: Gastroenterology;;   TRANSORAL INCISIONLESS FUNDOPLICATION N/A 09/03/2023   Procedure: TRANSORAL INCISIONLESS FUNDOPLICATION;  Surgeon: Shellia Cleverly, DO;  Location: [facility];  Service: Gastroenterology;  Laterality: N/A;   TUBAL LIGATION      I have reviewed the social history and family history with the patient and they are unchanged from previous note.  ALLERGIES:  is allergic to amoxicillin, ibuprofen, ciprofloxacin, and  codeine.  MEDICATIONS:  Current Outpatient Medications  Medication Sig Dispense Refill   albuterol (VENTOLIN HFA) 108 (90 Base) MCG/ACT inhaler Inhale 2 puffs into the lungs every 6 (six) hours as needed for wheezing or shortness of breath. (Patient not taking: Reported on 09/17/2023)     ALPRAZolam (XANAX) 0.5 MG tablet Take 0.5 mg by mouth as needed.     anastrozole (ARIMIDEX) 1 MG tablet Take 1 tablet (1 mg total) by mouth daily. 30 tablet 3   BIOTIN PO Take 1 tablet by mouth daily.     FIBER PO Take 1 tablet by mouth daily.     FLUoxetine (PROZAC) 40 MG capsule Take 40 mg by mouth daily.     fluticasone (FLONASE) 50 MCG/ACT nasal spray Place 1-2 sprays into both nostrils daily as needed for allergies or rhinitis. (Patient not taking: Reported on 09/17/2023)     gabapentin (NEURONTIN) 100 MG capsule take ONE capsule by MOUTH daily AT bedtime,can slowly titrate UP TO ONE capsule THREE times daily if tolerated 60 capsule 1   lisdexamfetamine (VYVANSE) 40 MG capsule Take 40 mg by mouth daily as needed (concentration). (Patient not taking: Reported on 09/17/2023)     loratadine-pseudoephedrine (CLARITIN-D 24-HOUR) 10-240 MG 24 hr tablet Take 1 tablet by mouth daily. (Patient not taking: Reported on 09/17/2023)     ondansetron (ZOFRAN) 4 MG tablet Take 1 tablet (4 mg total) by mouth every 8 (eight) hours as needed for nausea or vomiting. 20 tablet 0   pantoprazole (PROTONIX) 40 MG tablet Take 1 tablet by mouth daily for 2 weeks, then every other day for 1 week, then as needed.     Prenatal Vit-Fe Fumarate-FA (PRENATAL MULTIVITAMIN) TABS tablet Take 1 tablet by mouth daily at 12 noon.     rOPINIRole (REQUIP) 0.25 MG tablet Take 0.5 mg by mouth at bedtime.     rosuvastatin (CRESTOR) 5 MG tablet Take 5 mg by mouth daily. (Patient not taking: Reported on 09/17/2023)     simethicone (MYLICON) 80 MG chewable tablet Chew 1 tablet (80 mg total) by mouth every 6 (six) hours as needed for flatulence (gas,  bloating, abdominal discomfort). 30 tablet 0   Tapinarof (VTAMA) 1 % CREA Apply 1 Application topically daily. (Patient not taking: Reported on 09/17/2023)     TURMERIC PO Take 1 capsule by mouth daily. (Patient not taking: Reported on 09/17/2023)     zolpidem (AMBIEN) 10 MG tablet Take 10 mg by mouth at bedtime.     Current Facility-Administered Medications  Medication Dose Route Frequency Provider Last Rate  Last Admin   oxyCODONE-acetaminophen (PERCOCET/ROXICET) 5-325 MG per tablet 1 tablet  1 tablet Oral Q6H PRN         PHYSICAL EXAMINATION: ECOG PERFORMANCE STATUS: 0 - Asymptomatic  Vitals:   09/29/23 1411 09/29/23 1412  BP: (!) 163/98 (!) 150/90  Pulse: 99   Resp: 18   Temp: 98.2 F (36.8 C)   SpO2: 98%    Wt Readings from Last 3 Encounters:  09/29/23 178 lb 9.6 oz (81 kg)  09/17/23 175 lb 6 oz (79.5 kg)  09/03/23 176 lb (79.8 kg)     GENERAL:alert, no distress and comfortable SKIN: skin color, texture, turgor are normal, no rashes or significant lesions EYES: normal, Conjunctiva are pink and non-injected, sclera clear NECK: supple, thyroid normal size, non-tender, without nodularity LYMPH:  no palpable lymphadenopathy in the cervical, axillary  LUNGS: clear to auscultation and percussion with normal breathing effort HEART: regular rate & rhythm and no murmurs and no lower extremity edema ABDOMEN:abdomen soft, non-tender and normal bowel sounds Musculoskeletal:no cyanosis of digits and no clubbing  NEURO: alert & oriented x 3 with fluent speech, no focal motor/sensory deficits BREAST: Bilateral breast implants present, post double mastectomy, well-healed surgical sites. No palpable mass or adenopathy      LABORATORY DATA:  I have reviewed the data as listed    Latest Ref Rng & Units 09/29/2023    1:11 PM 08/13/2023    2:45 PM 07/02/2023   11:02 AM  CBC  WBC 4.0 - 10.5 K/uL 5.8  7.1  8.0   Hemoglobin 12.0 - 15.0 g/dL 16.1  09.6  04.5   Hematocrit 36.0 - 46.0 % 41.0   45.6  42.1   Platelets 150 - 400 K/uL 372  355  330         Latest Ref Rng & Units 09/29/2023    1:11 PM 07/02/2023   11:02 AM 06/04/2023    8:32 AM  CMP  Glucose 70 - 99 mg/dL 409  811  98   BUN 6 - 20 mg/dL 18  18  20    Creatinine 0.44 - 1.00 mg/dL 9.14  7.82  9.56   Sodium 135 - 145 mmol/L 137  135  137   Potassium 3.5 - 5.1 mmol/L 4.0  4.0  4.4   Chloride 98 - 111 mmol/L 102  100  101   CO2 22 - 32 mmol/L 29  26  29    Calcium 8.9 - 10.3 mg/dL 9.6  9.9  21.3   Total Protein 6.5 - 8.1 g/dL 7.2  8.0  7.4   Total Bilirubin <1.2 mg/dL 0.5  0.5  0.3   Alkaline Phos 38 - 126 U/L 75  86  80   AST 15 - 41 U/L 33  48  51   ALT 0 - 44 U/L 36  65  66       RADIOGRAPHIC STUDIES: I have personally reviewed the radiological images as listed and agreed with the findings in the report. No results found.    Orders Placed This Encounter  Procedures   NM PET Image Initial (PI) Skull Base To Thigh    Standing Status:   Future    Standing Expiration Date:   09/28/2024    Order Specific Question:   If indicated for the ordered procedure, I authorize the administration of a radiopharmaceutical per Radiology protocol    Answer:   Yes    Order Specific Question:   Is the patient pregnant?  Answer:   No    Order Specific Question:   Preferred imaging location?    Answer:   [facility]   DG Bone Density    Standing Status:   Future    Standing Expiration Date:   09/28/2024    Order Specific Question:   Reason for Exam (SYMPTOM  OR DIAGNOSIS REQUIRED)    Answer:   screening    Order Specific Question:   Is patient pregnant?    Answer:   No    Order Specific Question:   Preferred imaging location?    Answer:   [facility]   All questions were answered. The patient knows to call the clinic with any problems, questions or concerns. No barriers to learning was detected. The total time spent in the appointment was 40 minutes.     Malachy Mood, MD 09/29/2023

## 2023-09-29 NOTE — Assessment & Plan Note (Signed)
-  found on screening mammogram. 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 -she opted to proceed with b/l mastectomies with reconstruction on 01/07/22 with Dr. Luisa Hart and Dr. Leta Baptist. Pathology revealed b/l breast cancers. Left breast showed 2.1 cm invasive and in situ ductal carcinoma. Margins and lymph nodes negative (0/2). Repeated ER/PR/HER2 were all negative (ER 10% weak (+) on biopsy) -she developed left IMF necrotic tissue, excised by Dr. Leta Baptist on 01/27/22. -given prior treatment with adriamycin, she completed 4 cycles TC 02/12/22 - 06/06/22, tolerated fairly  -we reviewed that adjuvant radiation is not recommended due to her previous radiation for lymphoma. -She started adjuvant anastrozole in July 2024, with significant hot flashes.

## 2023-09-30 ENCOUNTER — Other Ambulatory Visit: Payer: Self-pay

## 2023-09-30 DIAGNOSIS — C50919 Malignant neoplasm of unspecified site of unspecified female breast: Secondary | ICD-10-CM

## 2023-09-30 DIAGNOSIS — Z170 Estrogen receptor positive status [ER+]: Secondary | ICD-10-CM

## 2023-09-30 NOTE — Progress Notes (Signed)
Verbal order w/readback from Dr. Mosetta Putt for [facility] to be done in March 2025.  Order placed.  Pt is scheduled to return on 01/27/2024.

## 2023-10-19 ENCOUNTER — Ambulatory Visit: Payer: BC Managed Care – PPO | Attending: Surgery

## 2023-10-19 VITALS — Wt 173.5 lb

## 2023-10-19 DIAGNOSIS — Z483 Aftercare following surgery for neoplasm: Secondary | ICD-10-CM | POA: Insufficient documentation

## 2023-10-19 NOTE — Therapy (Signed)
OUTPATIENT PHYSICAL THERAPY SOZO SCREENING NOTE   Patient Name: Erika Hodge MRN: [id number] DOB:1966-04-18, 57 y.o., female Today's Date: 10/19/2023  PCP: Galvin Proffer, MD REFERRING PROVIDER: Harriette Bouillon, MD   PT End of Session - 10/19/23 1632     Visit Number 22   # unchanged due to screen only   PT Start Time 1630    PT Stop Time 1634    PT Time Calculation (min) 4 min    Activity Tolerance Patient tolerated treatment well    Behavior During Therapy [facility] for tasks assessed/performed             Past Medical History:  Diagnosis Date   Allergic rhinitis 12/02/2021   Anemia    Anemia of chronic disease 04/08/2022   Anxiety    Bacterial vaginosis 05/25/2023   Bronchiectasis without complication (HCC) 04/07/2022   Cancer (HCC)    Chest pain    Complication of anesthesia    HA  One eye dosent close during and is dry and gets scratched has been to eye doctor 2 times after anesthesia for this   Depression    Elevated cholesterol    Family history of breast cancer 11/29/2021   Gastro-esophageal reflux disease without esophagitis 12/02/2021   Genetic testing 12/11/2021   [facility] was Negative. Report date is 12/11/2021.     The CancerNext gene panel offered by 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   Menorrhagia 05/25/2023   Neutropenic fever (HCC)  04/07/2022   Non Hodgkin's lymphoma (HCC)    Obesity (BMI 30-39.9) 04/08/2022   Psoriasis    Pulmonary nodules 04/07/2022   Restless leg syndrome 04/08/2022   Vitamin D deficiency 05/25/2023   Past Surgical History:  Procedure Laterality Date   BREAST BIOPSY Left 11/20/2021   BREAST RECONSTRUCTION WITH PLACEMENT OF TISSUE EXPANDER AND ALLODERM Bilateral 01/07/2022   Procedure: BILATERAL BREAST RECONSTRUCTION WITH PLACEMENT OF TISSUE EXPANDERS AND ALLODERM;  Surgeon: Glenna Fellows, MD;  Location: [facility];  Service: Plastics;  Laterality: Bilateral;   COLONOSCOPY  04/07/2018   [facility]. Dr. Charna Elizabeth. Four small polyps, 2 in the sigmoid colon and 2 in teh proximal ascending colon - removed by cold biopsies. One 7 mm sessile polyps in the mid sigmoid colon, removed with a hot snare x 1; resected and retrived. The examined portion of the ileum was normal. The ecamination was otherwise normal on direct and retroflexion views.   ESOPHAGOGASTRODUODENOSCOPY  11/19/2015   [facility]. Dr. Charna Elizabeth. LA Grade C reflux esophagitis. Esophageal mucosal changes suggestive of Eosinophilic esophagitis noted in the distal 2/3 of the esophagus-biopsies done. Medium-sized hiatal hernia noted on retroplexion. Moderate diffuse gastritis. Normal examined duodenum.   ESOPHAGOGASTRODUODENOSCOPY  02/12/2023   [facility]. Dr. Jeani Hawking.  LA Grade D reflux esophagitis with no bleeding. 3 cm hiatal hernia. Gastrophageal flap valve classified as  Hill Grade II ( fold present, opens with respiration). Normal stomach. Normal examined duodenum. No specimens collected.   ESOPHAGOGASTRODUODENOSCOPY N/A 09/03/2023   Procedure: ESOPHAGOGASTRODUODENOSCOPY (EGD);  Surgeon: Shellia Cleverly, DO;  Location: [facility];  Service: Gastroenterology;  Laterality: N/A;   HIATAL HERNIA REPAIR N/A 09/03/2023   Procedure: LAPAROSCOPIC REPAIR OF SLIDING HIATAL HERNIA;   Surgeon: Gaynelle Adu, MD;  Location: [facility];  Service: General;  Laterality: N/A;  60 MIN   NIPPLE SPARING MASTECTOMY Right 01/07/2022   Procedure: RIGHT NIPPLE SPARING MASTECTOMY;  Surgeon: Harriette Bouillon, MD;  Location: [facility];  Service: General;  Laterality: Right;   NIPPLE SPARING MASTECTOMY WITH SENTINEL LYMPH NODE BIOPSY Left 01/07/2022   Procedure: LEFT NIPPLE SPARING MASTECTOMY WITH SENTINEL LYMPH NODE BIOPSY;  Surgeon: Harriette Bouillon, MD;  Location: [facility];  Service: General;  Laterality: Left;   REMOVAL OF BILATERAL TISSUE EXPANDERS WITH PLACEMENT OF BILATERAL BREAST IMPLANTS Bilateral 07/15/2022   Procedure: REMOVAL OF BILATERAL TISSUE EXPANDERS WITH PLACEMENT OF BILATERAL BREAST IMPLANTS;  Surgeon: Glenna Fellows, MD;  Location: [facility];  Service: Plastics;  Laterality: Bilateral;   SAVORY DILATION  09/03/2023   Procedure: SAVORY DILATION;  Surgeon: Shellia Cleverly, DO;  Location: [facility];  Service: Gastroenterology;;   TRANSORAL INCISIONLESS FUNDOPLICATION N/A 09/03/2023   Procedure: TRANSORAL INCISIONLESS FUNDOPLICATION;  Surgeon: Shellia Cleverly, DO;  Location: [facility];  Service: Gastroenterology;  Laterality: N/A;   TUBAL LIGATION     Patient Active Problem List   Diagnosis Date Noted   Hiatal hernia 09/03/2023   Hiatal hernia with GERD and esophagitis 09/03/2023   Elevated liver enzymes 08/09/2023   Abdominal bloating 05/26/2023   Constipation 05/26/2023   Chronic idiopathic constipation 05/26/2023   Coffee ground emesis 05/26/2023   Dysphagia 05/26/2023   Gastro-esophageal reflux disease with esophagitis 05/26/2023   History of colonic polyps 05/26/2023   Vomiting without nausea 05/26/2023   Bacterial vaginosis 05/25/2023   Menorrhagia 05/25/2023   Vitamin D deficiency 05/25/2023   Anemia    Cancer (HCC)    Chest pain    Complication of anesthesia    Depression    Dyspnea    Elevated cholesterol     Jaw pain    Non Hodgkin's lymphoma (HCC)    Psoriasis    Hypokalemia 04/08/2022   Anemia of chronic disease 04/08/2022   Restless leg syndrome 04/08/2022   Obesity (BMI 30-39.9) 04/08/2022   Bronchiectasis without complication (HCC) 04/07/2022   Neutropenic fever (HCC) 04/07/2022   Pulmonary nodules 04/07/2022   Anxiety    Hiatal hernia with GERD    Invasive lobular carcinoma of right breast, stage 1 (HCC) 01/24/2022   Genetic testing 12/11/2021   Gastro-esophageal reflux disease without esophagitis 12/02/2021   Allergic rhinitis 12/02/2021   History of therapeutic radiation 12/02/2021   Family history of breast cancer 11/29/2021   Malignant neoplasm of upper-inner quadrant of left breast in female, estrogen receptor positive (HCC) 11/26/2021    REFERRING DIAG: left breast cancer at risk for lymphedema  THERAPY DIAG: Aftercare following surgery for neoplasm  PERTINENT HISTORY: Patient was diagnosed on 09/10/2021 with 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 Two negative axillary nodes were removed on the left side. An incidental finding of right breast invasive lobular carcinoma was also found and is ER/PR positive and HER2 negative. It is weakly ER positive, PR negative and  HER2 negative with a Ki67 of 20%. She has a left wrist plate from a previous surgery in 2006 and a history of non-Hodgkins Lymphoma in 1985.   PRECAUTIONS: left UE Lymphedema risk, None  SUBJECTIVE: Pt returns for her 3 month L-Dex screen.   PAIN:  Are you having pain? No  SOZO SCREENING: Patient was assessed today using the SOZO machine to determine the lymphedema index score. This was compared to her baseline score. It was determined that she is within the recommended range when compared to her baseline and no further action is needed at this time. She will continue SOZO screenings.  These are done every 3 months for 2 years post operatively followed by every 6 months for 2 years, and then annually.   L-DEX FLOWSHEETS - 10/19/23 1600       L-DEX LYMPHEDEMA SCREENING   Measurement Type Unilateral    L-DEX MEASUREMENT EXTREMITY Upper Extremity    POSITION  Standing    DOMINANT SIDE Right    At Risk Side Left    BASELINE SCORE (UNILATERAL) 6.4    L-DEX SCORE (UNILATERAL) 7.6    VALUE CHANGE (UNILAT) 1.2            P: Begin 6 month SOZO screens next per pt request as she has a far drive and her changes from baseline have been good.    Hermenia Bers, PTA 10/19/2023, 4:33 PM

## 2023-10-26 ENCOUNTER — Encounter (HOSPITAL_COMMUNITY)
Admission: RE | Admit: 2023-10-26 | Discharge: 2023-10-26 | Disposition: A | Discharge status: Home / Self Care | Payer: BC Managed Care – PPO | Source: Ambulatory Visit | Attending: Hematology | Admitting: Hematology

## 2023-10-26 DIAGNOSIS — Z170 Estrogen receptor positive status [ER+]: Secondary | ICD-10-CM | POA: Diagnosis present

## 2023-10-26 DIAGNOSIS — C50212 Malignant neoplasm of upper-inner quadrant of left female breast: Secondary | ICD-10-CM | POA: Diagnosis present

## 2023-10-26 LAB — GLUCOSE, CAPILLARY: Glucose-Capillary: 89 mg/dL (ref 70–99)

## 2023-10-26 MED ORDER — FLUDEOXYGLUCOSE F - 18 (FDG) INJECTION
8.6500 | Freq: Once | INTRAVENOUS | Status: AC
  Administered 2023-10-26: 8.6 via INTRAVENOUS

## 2023-10-29 ENCOUNTER — Other Ambulatory Visit: Payer: Self-pay

## 2023-11-02 ENCOUNTER — Telehealth: Payer: Self-pay

## 2023-11-02 ENCOUNTER — Other Ambulatory Visit: Payer: Self-pay

## 2023-11-02 DIAGNOSIS — Z170 Estrogen receptor positive status [ER+]: Secondary | ICD-10-CM

## 2023-11-02 DIAGNOSIS — E041 Nontoxic single thyroid nodule: Secondary | ICD-10-CM

## 2023-11-02 DIAGNOSIS — C50919 Malignant neoplasm of unspecified site of unspecified female breast: Secondary | ICD-10-CM

## 2023-11-02 NOTE — Telephone Encounter (Signed)
Pt called upset with the recent PET Scan results pt viewed on MyChart.  Pt stated she read her PET Scan results on MyChart and it stated her cancer had come back.  Reviewed pt's PET Scan results but did not read what pt was stating.  Instructed pt to scroll down to the bottom to the section called Impressions.  Pt read the results that stated the following:  IMPRESSION: Status post bilateral mastectomy with reconstruction.   No findings suspicious for recurrent or metastatic disease.   Small bilateral pulmonary nodules, unchanged, likely benign.   7 mm left thyroid nodule, FDG avid. Recommend thyroid US and biopsy (ref: J Am Coll Radiol. 2015 Feb;12(2): 143-50).  Pt realized she read the test incorrectly and had much relief.  Stated that there was a 7mm lft thyroid nodule found on the test that will need further evaluation.  Spoke with Santiago Glad, NP regarding the thyroid nodule and Lacie recommended the thyroid US.  Informed pt that Lacie would like to do a ultrasound of the thyroid regarding the nodule.  Pt agreed to ultrasound but was highly relieved of the results of the test.

## 2023-11-03 ENCOUNTER — Ambulatory Visit (HOSPITAL_COMMUNITY)
Admission: RE | Admit: 2023-11-03 | Discharge: 2023-11-03 | Disposition: A | Discharge status: Home / Self Care | Payer: BC Managed Care – PPO | Source: Ambulatory Visit | Attending: Nurse Practitioner | Admitting: Nurse Practitioner

## 2023-11-03 DIAGNOSIS — C50919 Malignant neoplasm of unspecified site of unspecified female breast: Secondary | ICD-10-CM | POA: Insufficient documentation

## 2023-11-03 DIAGNOSIS — Z170 Estrogen receptor positive status [ER+]: Secondary | ICD-10-CM | POA: Diagnosis present

## 2023-11-03 DIAGNOSIS — E041 Nontoxic single thyroid nodule: Secondary | ICD-10-CM | POA: Insufficient documentation

## 2023-11-03 DIAGNOSIS — C50212 Malignant neoplasm of upper-inner quadrant of left female breast: Secondary | ICD-10-CM | POA: Insufficient documentation

## 2023-11-10 ENCOUNTER — Telehealth: Payer: Self-pay

## 2023-11-10 NOTE — Telephone Encounter (Signed)
 Spoke with pt via telephone regarding recent thyroid  US  results.  Stated Lacie Burton, NP has reviewed the results and the results were normal.  No additional concerns per Lacie.  Pt was thrilled to hear the GREAT news.  Pt had no further questions or concerns.

## 2023-11-11 ENCOUNTER — Other Ambulatory Visit: Payer: Self-pay | Admitting: Nurse Practitioner

## 2023-12-11 ENCOUNTER — Other Ambulatory Visit: Payer: Self-pay

## 2023-12-21 ENCOUNTER — Telehealth: Payer: Self-pay

## 2023-12-21 ENCOUNTER — Other Ambulatory Visit: Payer: Self-pay

## 2023-12-21 NOTE — Telephone Encounter (Signed)
 LVM for pt stating that [employer] has tried calling pt 3 times to schedule mobile phlebotomy.  Stated Della Fent has contacted Dr Candise Chambers office d/t to their failure to reach pt.  Stated Dr. Maryalice Smaller uses [employer] has a monitoring/surveillance mechanism in place of CT Scans.  Provided pt with the telephone number to [employer] to schedule her mobile phlebotomy appt.  Requested for pt to contact Dr. Candise Chambers office if the pt no longer wants to do [employer] monitoring.  Awaiting response from pt.

## 2023-12-24 ENCOUNTER — Telehealth: Payer: Self-pay

## 2023-12-24 NOTE — Telephone Encounter (Signed)
Patient returned call and scheduled follow up appointment for 03-23-24 at 320pm.

## 2023-12-24 NOTE — Telephone Encounter (Signed)
Left message for patient to return call to schedule follow up appointment with Dr Barron Alvine.  Will continue efforts.

## 2023-12-25 ENCOUNTER — Other Ambulatory Visit: Payer: Self-pay

## 2024-01-01 ENCOUNTER — Other Ambulatory Visit: Payer: Self-pay

## 2024-01-18 ENCOUNTER — Ambulatory Visit: Payer: BC Managed Care – PPO

## 2024-01-25 ENCOUNTER — Other Ambulatory Visit: Payer: Self-pay

## 2024-01-25 DIAGNOSIS — C50212 Malignant neoplasm of upper-inner quadrant of left female breast: Secondary | ICD-10-CM

## 2024-01-25 DIAGNOSIS — C50919 Malignant neoplasm of unspecified site of unspecified female breast: Secondary | ICD-10-CM

## 2024-01-26 ENCOUNTER — Other Ambulatory Visit: Payer: Self-pay | Admitting: Nurse Practitioner

## 2024-01-26 DIAGNOSIS — C50919 Malignant neoplasm of unspecified site of unspecified female breast: Secondary | ICD-10-CM

## 2024-01-26 DIAGNOSIS — C50212 Malignant neoplasm of upper-inner quadrant of left female breast: Secondary | ICD-10-CM

## 2024-01-26 NOTE — Progress Notes (Unsigned)
 Patient Care Team: Hague, Myrene Galas, MD as PCP - General (Internal Medicine) Harriette Bouillon, MD as Consulting Physician (General Surgery) Malachy Mood, MD as Consulting Physician (Hematology) Antony Blackbird, MD as Consulting Physician (Radiation Oncology) Shellia Cleverly, DO as Consulting Physician (Gastroenterology)  Clinic Day:  01/27/2024  Referring physician: Galvin Proffer, MD  ASSESSMENT & PLAN:   Assessment & Plan: Malignant neoplasm of upper-inner quadrant of left breast in female, estrogen receptor positive (HCC) -found on screening mammogram. 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 -she opted to proceed with b/l mastectomies with reconstruction on 01/07/22 with Dr. Luisa Hart and Dr. Leta Baptist. Pathology revealed b/l breast cancers. Left breast showed 2.1 cm invasive and in situ ductal carcinoma. Margins and lymph nodes negative (0/2). Repeated ER/PR/HER2 were all negative (ER 10% weak (+) on biopsy) -she developed left IMF necrotic tissue, excised by Dr. Leta Baptist on 01/27/22. -given prior treatment with adriamycin, she completed 4 cycles TC 02/12/22 - 06/06/22, tolerated fairly  -we reviewed that adjuvant radiation is not recommended due to her previous radiation for lymphoma. -She started adjuvant anastrozole in July 2024, with significant hot flashes. -PET scan from December 2024 with benign with stable FDG avid thyroid nodules. -Trial Veozah 45 mg daily to help with hot flashes. -Bilateral breast MRI in December 2025 for breast cancer surveillance. -Signatera testing, labs, and follow-up in 4 months.   Chronic UTI Patient currently on antibiotics for UTI from her GYN provider.  Unable to remember the name.  Continues to have symptoms of flank and low back pain.  Sometimes having dysuria.  Will get urine sample today along with culture and sensitivity.  Adjust antibiotic dosing as indicated.  Hot flashes Patient currently on anastrozole.  Would like trial of Veozah 45  mg.  New prescription sent to the pharmacy.  Encouraged patient to find manufacturer co-pay coupon to help with cost.  Plan: Labs reviewed.  They are unremarkable.  Signatera test pending. Reviewed PET scan from December 2024 which was negative for evidence of metastatic or recurrent disease. Will get MRI of bilateral breasts in December 2025. Continue anastrozole 1 mg daily.  Refill as needed. Labs and follow-up in 4 months.  The patient understands the plans discussed today and is in agreement with them.  She knows to contact our office if she develops concerns prior to her next appointment.  I provided 30 minutes of face-to-face time during this encounter and > 50% was spent counseling as documented under my assessment and plan.    Carlean Jews, NP  [facility] - A DEPT OF [facility] [address] [place] [address] Dept: [phone] Dept Fax: [phone]   Orders Placed This Encounter  Procedures   Urine Culture    Standing Status:   Future    Number of Occurrences:   1    Expected Date:   01/27/2024    Expiration Date:   01/26/2025   Urinalysis, Complete w Microscopic    Standing Status:   Future    Number of Occurrences:   1    Expected Date:   01/27/2024    Expiration Date:   01/26/2025   POC Urinalysis Dipstick OB      CHIEF COMPLAINT:  CC: Bilateral breast cancer  Current Treatment: Anastrozole  INTERVAL HISTORY:  Erika Hodge is here today for repeat clinical assessment.  She was last seen by Dr. Mosetta Putt on 09/29/2023.  PET scan  done 10/26/2023 showed status post bilateral mastectomy with reconstruction, no findings suspicious for recurrent or metastatic disease.  Small, unchanged pulmonary nodules, likely benign.  There is a 7 mm left thyroid nodule which was FDG avid.  Thyroid ultrasound showed stability of small bilateral thyroid nodules, benign for course of 5 years.  States she is currently on  antibiotics from her GYN due to UTI.  She continues to have low back and flank pain with some urinary frequency.  Denies urgency.  Has intermittent dysuria.  She also reports moderate hot flashes.  Her husband, present during today's visit, states even in the winter, she kept the house very cold.  She is interested in trying Veozah to help with hot flashes.  She has noted no new changes in the breasts or nipples.  She reports her GYN provider questioned her about nipple sparing reconstructive surgery.  Reviewed biopsy of bilateral nipples done 2023 which were benign.  Will repeat MRI bilateral breast yearly and obtain Signatera testing to evaluate for circulating tumor DNA. She denies chest pain, chest pressure, or shortness of breath. She denies headaches or visual disturbances. He denies abdominal pain, nausea, vomiting, or changes in bowel habits.  She denies fevers or chills. She denies pain. Her appetite is good. Her weight has increased 3 pounds over last 3 months .  I have reviewed the past medical history, past surgical history, social history and family history with the patient and they are unchanged from previous note.  ALLERGIES:  is allergic to amoxicillin, ibuprofen, ciprofloxacin, and codeine.  MEDICATIONS:  Current Outpatient Medications  Medication Sig Dispense Refill   albuterol (VENTOLIN HFA) 108 (90 Base) MCG/ACT inhaler Inhale 2 puffs into the lungs every 6 (six) hours as needed for wheezing or shortness of breath.     ALPRAZolam (XANAX) 0.5 MG tablet Take 0.5 mg by mouth as needed.     anastrozole (ARIMIDEX) 1 MG tablet Take 1 tablet (1 mg total) by mouth daily. 30 tablet 3   BIOTIN PO Take 1 tablet by mouth daily.     Fezolinetant (VEOZAH) 45 MG TABS Take 1 tablet (45 mg total) by mouth daily. 30 tablet 3   FIBER PO Take 1 tablet by mouth daily.     FLUoxetine (PROZAC) 40 MG capsule Take 40 mg by mouth daily.     fluticasone (FLONASE) 50 MCG/ACT nasal spray Place 1-2 sprays into  both nostrils daily as needed for allergies or rhinitis.     gabapentin (NEURONTIN) 100 MG capsule TAKE ONE CAPSULE BY MOUTH DAILY AT BEDTIME, CAN SLOWLY TITRATE UP TO ONE CAPSULE THREE TIMES DAILY IF TOLERATED 60 capsule 2   lisdexamfetamine (VYVANSE) 40 MG capsule Take 40 mg by mouth daily as needed (concentration).     loratadine-pseudoephedrine (CLARITIN-D 24-HOUR) 10-240 MG 24 hr tablet Take 1 tablet by mouth daily.     ondansetron (ZOFRAN) 4 MG tablet Take 1 tablet (4 mg total) by mouth every 8 (eight) hours as needed for nausea or vomiting. 20 tablet 0   pantoprazole (PROTONIX) 40 MG tablet Take 1 tablet by mouth daily for 2 weeks, then every other day for 1 week, then as needed.     Prenatal Vit-Fe Fumarate-FA (PRENATAL MULTIVITAMIN) TABS tablet Take 1 tablet by mouth daily at 12 noon.     rOPINIRole (REQUIP) 0.25 MG tablet Take 0.5 mg by mouth at bedtime.     rosuvastatin (CRESTOR) 5 MG tablet Take 5 mg by mouth daily.     simethicone (  MYLICON) 80 MG chewable tablet Chew 1 tablet (80 mg total) by mouth every 6 (six) hours as needed for flatulence (gas, bloating, abdominal discomfort). 30 tablet 0   Tapinarof (VTAMA) 1 % CREA Apply 1 Application topically daily.     TURMERIC PO Take 1 capsule by mouth daily.     zolpidem (AMBIEN) 10 MG tablet Take 10 mg by mouth at bedtime.     Current Facility-Administered Medications  Medication Dose Route Frequency Provider Last Rate Last Admin   oxyCODONE-acetaminophen (PERCOCET/ROXICET) 5-325 MG per tablet 1 tablet  1 tablet Oral Q6H PRN         HISTORY OF PRESENT ILLNESS:   Oncology History Overview Note   Cancer Staging  Invasive lobular carcinoma of right breast, stage 1 (HCC) Staging form: Breast, AJCC 8th Edition - Clinical stage from 01/07/2022: Stage Unknown (cT1c, cNX, cM0, G1, ER+, PR+, HER2-) - Signed by Malachy Mood, MD on 01/24/2022  Malignant neoplasm of upper-inner quadrant of left breast in female, estrogen receptor positive  (HCC) Staging form: Breast, AJCC 8th Edition - Clinical stage from 11/20/2021: Stage IA (cT1c, cN0, cM0, G2, ER+, PR-, HER2-) - Signed by Malachy Mood, MD on 11/26/2021 - Pathologic stage from 01/07/2022: Stage IIA (pT2, pN0, cM0, G2, ER-, PR-, HER2-) - Signed by Malachy Mood, MD on 01/24/2022     Malignant neoplasm of upper-inner quadrant of left breast in female, estrogen receptor positive (HCC)  11/05/2021 Mammogram   CLINICAL DATA:  Screening recall for a possible left breast asymmetry.   EXAM: DIGITAL DIAGNOSTIC UNILATERAL LEFT MAMMOGRAM WITH TOMOSYNTHESIS AND CAD; ULTRASOUND LEFT BREAST LIMITED  IMPRESSION: 1. Irregular 1.2 cm mass in the left breast at 11 o'clock, 2 cm the nipple, suspicious for malignancy.   11/20/2021 Cancer Staging   Staging form: Breast, AJCC 8th Edition - Clinical stage from 11/20/2021: Stage IA (cT1c, cN0, cM0, G2, ER+, PR-, HER2-) - Signed by Malachy Mood, MD on 11/26/2021 Stage prefix: Initial diagnosis Histologic grading system: 3 grade system   11/20/2021 Initial Biopsy   Diagnosis Breast, left, needle core biopsy, 11 o'clock, ribbon clip - INVASIVE DUCTAL CARCINOMA - DUCTAL CARCINOMA IN SITU - SEE COMMENT Microscopic Comment based on the biopsy, the carcinoma appears Nottingham grade 2 of 3 and measures 0.6 cm in greatest linear extent.  PROGNOSTIC INDICATORS Results: The tumor cells are NEGATIVE for Her2 (1+). Estrogen Receptor: 10%, POSITIVE, WEAK STAINING INTENSITY Progesterone Receptor: 0%, NEGATIVE Proliferation Marker Ki67: 20%   11/26/2021 Initial Diagnosis   Malignant neoplasm of upper-inner quadrant of left breast in female, estrogen receptor positive (HCC)   11/27/2021 Genetic Testing   [facility] was Negative. Report date is 12/11/2021.  The CancerNext gene panel offered by [employer] includes sequencing, rearrangement analysis, and RNA analysis for the following 36 genes:   APC, ATM, AXIN2, BARD1, BMPR1A, BRCA1, BRCA2, BRIP1, CDH1,  CDK4, CDKN2A, CHEK2, DICER1, HOXB13, EPCAM, GREM1, MLH1, MSH2, MSH3, MSH6, MUTYH, NBN, NF1, NTHL1, PALB2, PMS2, POLD1, POLE, PTEN, RAD51C, RAD51D, RECQL, SMAD4, SMARCA4, STK11, and TP53.    01/07/2022 Cancer Staging   Staging form: Breast, AJCC 8th Edition - Pathologic stage from 01/07/2022: Stage IIA (pT2, pN0, cM0, G2, ER-, PR-, HER2-) - Signed by Malachy Mood, MD on 01/24/2022 Stage prefix: Initial diagnosis Histologic grading system: 3 grade system Residual tumor (R): R0 - None   01/07/2022 Definitive Surgery   FINAL MICROSCOPIC DIAGNOSIS:   A. BREAST, RIGHT NIPPLE, BIOPSY:  - Benign breast tissue.  - No malignancy identified.   B.  BREAST, LEFT NIPPLE, BIOPSY:  - Benign breast tissue.  - No malignancy identified.   C. BREAST, RIGHT, MASTECTOMY:  - Invasive lobular carcinoma, 1.1 cm.  - Ductal carcinoma in situ, 1 cm.  - Invasive carcinoma focally extends to the anterior margin.  - Fibrocystic changes with usual ductal hyperplasia and calcifications.  - Sclerosing adenosis and fibroadenomatoid change.  - See oncology table and comment.   D. LYMPH NODE, LEFT AXILLARY, SENTINEL, EXCISION:  - One lymph node negative for metastatic carcinoma (0/1).   E. LYMPH NODE, LEFT AXILLARY, SENTINEL, EXCISION:  - One lymph node negative for metastatic carcinoma (0/1).   F. BREAST, LEFT, MASTECTOMY:  - Invasive and in situ ductal carcinoma, 2.1 cm.  - Margins negative for carcinoma.  - See oncology table.    01/07/2022 Receptors her2   F1. PROGNOSTIC INDICATOR RESULTS:   The tumor cells are NEGATIVE for Her2 (0).   Estrogen Receptor: NEGATIVE  Progesterone Receptor: NEGATIVE  Proliferation Marker Ki-67: 10%    02/12/2022 - 06/06/2022 Chemotherapy   Patient is on Treatment Plan : BREAST TC q21d     08/2022 -  Anti-estrogen oral therapy   Tamoxifen   11/01/2023 PET scan   IMPRESSION: Status post bilateral mastectomy with reconstruction.  No findings suspicious for recurrent or  metastatic disease.  Small bilateral pulmonary nodules, unchanged, likely benign.  7 mm left thyroid nodule, FDG avid. Recommend thyroid US and biopsy   Invasive lobular carcinoma of right breast, stage 1 (HCC)  11/27/2021 Genetic Testing   [facility] was Negative. Report date is 12/11/2021.  The CancerNext gene panel offered by [employer] includes sequencing, rearrangement analysis, and RNA analysis for the following 36 genes:   APC, ATM, AXIN2, BARD1, BMPR1A, BRCA1, BRCA2, BRIP1, CDH1, CDK4, CDKN2A, CHEK2, DICER1, HOXB13, EPCAM, GREM1, MLH1, MSH2, MSH3, MSH6, MUTYH, NBN, NF1, NTHL1, PALB2, PMS2, POLD1, POLE, PTEN, RAD51C, RAD51D, RECQL, SMAD4, SMARCA4, STK11, and TP53.    01/07/2022 Cancer Staging   Staging form: Breast, AJCC 8th Edition - Clinical stage from 01/07/2022: Stage Unknown (cT1c, cNX, cM0, G1, ER+, PR+, HER2-) - Signed by Malachy Mood, MD on 01/24/2022 Stage prefix: Initial diagnosis Histologic grading system: 3 grade system   01/07/2022 Definitive Surgery   FINAL MICROSCOPIC DIAGNOSIS:   A. BREAST, RIGHT NIPPLE, BIOPSY:  - Benign breast tissue.  - No malignancy identified.   B. BREAST, LEFT NIPPLE, BIOPSY:  - Benign breast tissue.  - No malignancy identified.   C. BREAST, RIGHT, MASTECTOMY:  - Invasive lobular carcinoma, 1.1 cm.  - Ductal carcinoma in situ, 1 cm.  - Invasive carcinoma focally extends to the anterior margin.  - Fibrocystic changes with usual ductal hyperplasia and calcifications.  - Sclerosing adenosis and fibroadenomatoid change.  - See oncology table and comment.   D. LYMPH NODE, LEFT AXILLARY, SENTINEL, EXCISION:  - One lymph node negative for metastatic carcinoma (0/1).   E. LYMPH NODE, LEFT AXILLARY, SENTINEL, EXCISION:  - One lymph node negative for metastatic carcinoma (0/1).   F. BREAST, LEFT, MASTECTOMY:  - Invasive and in situ ductal carcinoma, 2.1 cm.  - Margins negative for carcinoma.  - See oncology table.    01/07/2022  Receptors her2   C7.  PROGNOSTIC INDICATOR RESULTS:   The tumor cells are NEGATIVE for Her2 (1+).   Estrogen Receptor: POSITIVE, 30% WEAK STAINING INTENSITY  Progesterone Receptor: POSITIVE, 80% MODERATE STAINING INTENSITY  Proliferation Marker Ki-67: <5%    01/24/2022 Initial Diagnosis   Invasive lobular carcinoma of right  breast, stage 1 (HCC)   08/2022 -  Anti-estrogen oral therapy   Tamoxifen       REVIEW OF SYSTEMS:   Constitutional: Denies fevers, chills or abnormal weight loss.  Moderate hot flashes Eyes: Denies blurriness of vision Ears, nose, mouth, throat, and face: Denies mucositis or sore throat Respiratory: Denies cough, dyspnea or wheezes Cardiovascular: Denies palpitation, chest discomfort or lower extremity swelling Gastrointestinal:  Denies nausea, heartburn or change in bowel habits Skin: Denies abnormal skin rashes Lymphatics: Denies new lymphadenopathy or easy bruising Neurological:Denies numbness, tingling or new weaknesses Behavioral/Psych: Mood is stable, no new changes  Genitourinary: Low back and bilateral flank pain with some urinary frequency.  Currently on antibiotics for UTI however, symptoms are still present. All other systems were reviewed with the patient and are negative.   VITALS:   Today's Vitals   01/27/24 1109  BP: (!) 142/80  Pulse: 91  Resp: 17  Temp: 97.8 F (36.6 C)  SpO2: 99%  Weight: 176 lb 6.4 oz (80 kg)  PainSc: 1    Body mass index is 31.25 kg/m.   Wt Readings from Last 3 Encounters:  01/27/24 176 lb 6.4 oz (80 kg)  10/19/23 173 lb 8 oz (78.7 kg)  09/29/23 178 lb 9.6 oz (81 kg)    Body mass index is 31.25 kg/m.  Performance status (ECOG): 1 - Symptomatic but completely ambulatory  PHYSICAL EXAM:   GENERAL:alert, no distress and comfortable SKIN: skin color, texture, turgor are normal, no rashes or significant lesions EYES: normal, Conjunctiva are pink and non-injected, sclera clear OROPHARYNX:no exudate, no  erythema and lips, buccal mucosa, and tongue normal  NECK: supple, thyroid normal size, non-tender, without nodularity LYMPH:  no palpable lymphadenopathy in the cervical, axillary or inguinal LUNGS: clear to auscultation and percussion with normal breathing effort HEART: regular rate & rhythm and no murmurs and no lower extremity edema ABDOMEN:abdomen soft, non-tender and normal bowel sounds Musculoskeletal:no cyanosis of digits and no clubbing  NEURO: alert & oriented x 3 with fluent speech, no focal motor/sensory deficits BREAST: Right breast surgically removed with implant present.  No new lumps or masses intervening breast tissue or chest wall.  Nipples intact without inversion or discharge.  There is no axillary lymphadenopathy on the right.  Left breast is surgically absent with implant present.  No new lumps or masses.  No nipple inversion or discharge present.  Well-healed axillary scar without axillary lymphadenopathy on the left.  LABORATORY DATA:  I have reviewed the data as listed    Component Value Date/Time   NA 139 01/27/2024 1030   K 4.2 01/27/2024 1030   CL 105 01/27/2024 1030   CO2 28 01/27/2024 1030   GLUCOSE 85 01/27/2024 1030   BUN 17 01/27/2024 1030   CREATININE 0.96 01/27/2024 1030   CALCIUM 10.2 01/27/2024 1030   PROT 7.5 01/27/2024 1030   ALBUMIN 4.5 01/27/2024 1030   AST 23 01/27/2024 1030   ALT 22 01/27/2024 1030   ALKPHOS 87 01/27/2024 1030   BILITOT 0.3 01/27/2024 1030   GFRNONAA >60 01/27/2024 1030    Lab Results  Component Value Date   WBC 6.8 01/27/2024   NEUTROABS 4.1 01/27/2024   HGB 14.0 01/27/2024   HCT 43.8 01/27/2024   MCV 94.6 01/27/2024   PLT 390 01/27/2024

## 2024-01-26 NOTE — Assessment & Plan Note (Signed)
-  found on screening mammogram. 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 -she opted to proceed with b/l mastectomies with reconstruction on 01/07/22 with Dr. Luisa Hart and Dr. Leta Baptist. Pathology revealed b/l breast cancers. Left breast showed 2.1 cm invasive and in situ ductal carcinoma. Margins and lymph nodes negative (0/2). Repeated ER/PR/HER2 were all negative (ER 10% weak (+) on biopsy) -she developed left IMF necrotic tissue, excised by Dr. Leta Baptist on 01/27/22. -given prior treatment with adriamycin, she completed 4 cycles TC 02/12/22 - 06/06/22, tolerated fairly  -we reviewed that adjuvant radiation is not recommended due to her previous radiation for lymphoma. -She started adjuvant anastrozole in July 2024, with significant hot flashes. -PET scan from December 2024 with benign with stable FDG avid thyroid nodules. -Trial Veozah 45 mg daily to help with hot flashes. -Bilateral breast MRI in December 2025 for breast cancer surveillance. -Signatera testing, labs, and follow-up in 4 months.

## 2024-01-27 ENCOUNTER — Encounter: Payer: Self-pay | Admitting: Nurse Practitioner

## 2024-01-27 ENCOUNTER — Inpatient Hospital Stay: Payer: BC Managed Care – PPO

## 2024-01-27 ENCOUNTER — Inpatient Hospital Stay

## 2024-01-27 ENCOUNTER — Inpatient Hospital Stay: Payer: BC Managed Care – PPO | Attending: Hematology | Admitting: Nurse Practitioner

## 2024-01-27 VITALS — BP 142/80 | HR 91 | Temp 97.8°F | Resp 17 | Wt 176.4 lb

## 2024-01-27 DIAGNOSIS — N390 Urinary tract infection, site not specified: Secondary | ICD-10-CM | POA: Diagnosis not present

## 2024-01-27 DIAGNOSIS — C50212 Malignant neoplasm of upper-inner quadrant of left female breast: Secondary | ICD-10-CM

## 2024-01-27 DIAGNOSIS — R300 Dysuria: Secondary | ICD-10-CM | POA: Diagnosis not present

## 2024-01-27 DIAGNOSIS — Z170 Estrogen receptor positive status [ER+]: Secondary | ICD-10-CM | POA: Insufficient documentation

## 2024-01-27 DIAGNOSIS — Z79810 Long term (current) use of selective estrogen receptor modulators (SERMs): Secondary | ICD-10-CM | POA: Insufficient documentation

## 2024-01-27 DIAGNOSIS — Z9013 Acquired absence of bilateral breasts and nipples: Secondary | ICD-10-CM | POA: Diagnosis not present

## 2024-01-27 DIAGNOSIS — C50919 Malignant neoplasm of unspecified site of unspecified female breast: Secondary | ICD-10-CM

## 2024-01-27 LAB — URINALYSIS, COMPLETE (UACMP) WITH MICROSCOPIC
Bacteria, UA: NONE SEEN
Bilirubin Urine: NEGATIVE
Glucose, UA: NEGATIVE mg/dL
Hgb urine dipstick: NEGATIVE
Ketones, ur: NEGATIVE mg/dL
Leukocytes,Ua: NEGATIVE
Nitrite: NEGATIVE
Protein, ur: NEGATIVE mg/dL
Specific Gravity, Urine: 1.017 (ref 1.005–1.030)
pH: 5 (ref 5.0–8.0)

## 2024-01-27 LAB — CMP (CANCER CENTER ONLY)
ALT: 22 U/L (ref 0–44)
AST: 23 U/L (ref 15–41)
Albumin: 4.5 g/dL (ref 3.5–5.0)
Alkaline Phosphatase: 87 U/L (ref 38–126)
Anion gap: 6 (ref 5–15)
BUN: 17 mg/dL (ref 6–20)
CO2: 28 mmol/L (ref 22–32)
Calcium: 10.2 mg/dL (ref 8.9–10.3)
Chloride: 105 mmol/L (ref 98–111)
Creatinine: 0.96 mg/dL (ref 0.44–1.00)
GFR, Estimated: >60 mL/min (ref >60)
Glucose, Bld: 85 mg/dL (ref 70–99)
Potassium: 4.2 mmol/L (ref 3.5–5.1)
Sodium: 139 mmol/L (ref 135–145)
Total Bilirubin: 0.3 mg/dL (ref 0.0–1.2)
Total Protein: 7.5 g/dL (ref 6.5–8.1)

## 2024-01-27 LAB — CBC WITH DIFFERENTIAL (CANCER CENTER ONLY)
Abs Immature Granulocytes: 0.02 10*3/uL (ref 0.00–0.07)
Basophils Absolute: 0.1 10*3/uL (ref 0.0–0.1)
Basophils Relative: 2 %
Eosinophils Absolute: 0.3 10*3/uL (ref 0.0–0.5)
Eosinophils Relative: 4 %
HCT: 43.8 % (ref 36.0–46.0)
Hemoglobin: 14 g/dL (ref 12.0–15.0)
Immature Granulocytes: 0 %
Lymphocytes Relative: 22 %
Lymphs Abs: 1.5 10*3/uL (ref 0.7–4.0)
MCH: 30.2 pg (ref 26.0–34.0)
MCHC: 32 g/dL (ref 30.0–36.0)
MCV: 94.6 fL (ref 80.0–100.0)
Monocytes Absolute: 0.8 10*3/uL (ref 0.1–1.0)
Monocytes Relative: 12 %
Neutro Abs: 4.1 10*3/uL (ref 1.7–7.7)
Neutrophils Relative %: 60 %
Platelet Count: 390 10*3/uL (ref 150–400)
RBC: 4.63 MIL/uL (ref 3.87–5.11)
RDW: 13.2 % (ref 11.5–15.5)
WBC Count: 6.8 10*3/uL (ref 4.0–10.5)
nRBC: 0 % (ref 0.0–0.2)

## 2024-01-27 LAB — GENETIC SCREENING ORDER

## 2024-01-27 MED ORDER — VEOZAH 45 MG PO TABS
45.0000 mg | ORAL_TABLET | Freq: Every day | ORAL | 3 refills | Status: DC
Start: 2024-01-27 — End: 2024-04-24

## 2024-01-28 ENCOUNTER — Encounter: Payer: Self-pay | Admitting: Nurse Practitioner

## 2024-01-28 LAB — URINE CULTURE: Culture: 20000 — AB

## 2024-01-29 ENCOUNTER — Other Ambulatory Visit: Payer: Self-pay | Admitting: Nurse Practitioner

## 2024-01-29 MED ORDER — CEPHALEXIN 500 MG PO CAPS: 500.0000 mg | ORAL_CAPSULE | Freq: Three times a day (TID) | ORAL | 0 refills | Status: DC

## 2024-02-07 LAB — SIGNATERA
SIGNATERA MTM READOUT: 0 MTM/ml
SIGNATERA TEST RESULT: NEGATIVE

## 2024-02-16 ENCOUNTER — Encounter: Payer: Self-pay | Admitting: Hematology

## 2024-02-17 ENCOUNTER — Other Ambulatory Visit: Payer: Self-pay

## 2024-02-18 ENCOUNTER — Other Ambulatory Visit: Payer: Self-pay

## 2024-02-18 ENCOUNTER — Emergency Department (HOSPITAL_COMMUNITY)
Admission: EM | Admit: 2024-02-18 | Discharge: 2024-02-18 | Disposition: A | Discharge status: Home / Self Care | Attending: Emergency Medicine | Admitting: Emergency Medicine

## 2024-02-18 ENCOUNTER — Emergency Department (HOSPITAL_COMMUNITY)

## 2024-02-18 DIAGNOSIS — Z853 Personal history of malignant neoplasm of breast: Secondary | ICD-10-CM | POA: Insufficient documentation

## 2024-02-18 DIAGNOSIS — Z8572 Personal history of non-Hodgkin lymphomas: Secondary | ICD-10-CM | POA: Diagnosis not present

## 2024-02-18 DIAGNOSIS — M545 Low back pain, unspecified: Secondary | ICD-10-CM | POA: Insufficient documentation

## 2024-02-18 DIAGNOSIS — R300 Dysuria: Secondary | ICD-10-CM | POA: Diagnosis not present

## 2024-02-18 LAB — URINALYSIS, ROUTINE W REFLEX MICROSCOPIC
Bilirubin Urine: NEGATIVE
Glucose, UA: NEGATIVE mg/dL
Hgb urine dipstick: NEGATIVE
Ketones, ur: NEGATIVE mg/dL
Leukocytes,Ua: NEGATIVE
Nitrite: POSITIVE — AB
Protein, ur: NEGATIVE mg/dL
Specific Gravity, Urine: 1.009 (ref 1.005–1.030)
pH: 5 (ref 5.0–8.0)

## 2024-02-18 LAB — COMPREHENSIVE METABOLIC PANEL WITH GFR
ALT: 26 U/L (ref 0–44)
AST: 29 U/L (ref 15–41)
Albumin: 4.1 g/dL (ref 3.5–5.0)
Alkaline Phosphatase: 78 U/L (ref 38–126)
Anion gap: 10 (ref 5–15)
BUN: 21 mg/dL — ABNORMAL HIGH (ref 6–20)
CO2: 25 mmol/L (ref 22–32)
Calcium: 9.8 mg/dL (ref 8.9–10.3)
Chloride: 105 mmol/L (ref 98–111)
Creatinine, Ser: 0.94 mg/dL (ref 0.44–1.00)
GFR, Estimated: >60 mL/min (ref >60)
Glucose, Bld: 105 mg/dL — ABNORMAL HIGH (ref 70–99)
Potassium: 3.8 mmol/L (ref 3.5–5.1)
Sodium: 140 mmol/L (ref 135–145)
Total Bilirubin: 0.4 mg/dL (ref 0.0–1.2)
Total Protein: 7.1 g/dL (ref 6.5–8.1)

## 2024-02-18 LAB — CBC WITH DIFFERENTIAL/PLATELET
Abs Immature Granulocytes: 0 10*3/uL (ref 0.00–0.07)
Basophils Absolute: 0.1 10*3/uL (ref 0.0–0.1)
Basophils Relative: 1 %
Eosinophils Absolute: 0.3 10*3/uL (ref 0.0–0.5)
Eosinophils Relative: 5 %
HCT: 40.6 % (ref 36.0–46.0)
Hemoglobin: 12.4 g/dL (ref 12.0–15.0)
Immature Granulocytes: 0 %
Lymphocytes Relative: 25 %
Lymphs Abs: 1.5 10*3/uL (ref 0.7–4.0)
MCH: 30.2 pg (ref 26.0–34.0)
MCHC: 30.5 g/dL (ref 30.0–36.0)
MCV: 99 fL (ref 80.0–100.0)
Monocytes Absolute: 0.8 10*3/uL (ref 0.1–1.0)
Monocytes Relative: 12 %
Neutro Abs: 3.5 10*3/uL (ref 1.7–7.7)
Neutrophils Relative %: 57 %
Platelets: 354 10*3/uL (ref 150–400)
RBC: 4.1 MIL/uL (ref 3.87–5.11)
RDW: 13.1 % (ref 11.5–15.5)
WBC: 6.1 10*3/uL (ref 4.0–10.5)
nRBC: 0 % (ref 0.0–0.2)

## 2024-02-18 NOTE — ED Triage Notes (Signed)
 Pt states that she is having difficulty urinating. States that it burns when she goes to the bathroom. Pt has been taking antibiotics for the last 6 days with no relief. Pt states that it feels different than her previous UTI.

## 2024-02-18 NOTE — ED Provider Notes (Addendum)
 [place] EMERGENCY DEPARTMENT AT [facility] Provider Note   CSN: [id number] Arrival date & time: 02/18/24  1814     History  Chief Complaint  Patient presents with   Urinary Tract Infection   Back Pain    Erika Hodge is a 58 y.o. female.   Urinary Tract Infection Back Pain Patient presents with some urinary symptoms and low back pain.  Had over the last month.  Has had antibiotics for UTI.  States worse over the last 6 days.  Does have some urinary frequency.  No fevers.  States feels different than last UTI.  No numbness or weakness.  Does have low back pain.  History of breast cancer but cancer previous partially    Past Medical History:  Diagnosis Date   Allergic rhinitis 12/02/2021   Anemia    Anemia of chronic disease 04/08/2022   Anxiety    Bacterial vaginosis 05/25/2023   Bronchiectasis without complication (HCC) 04/07/2022   Cancer (HCC)    Chest pain    Complication of anesthesia    HA  One eye dosent close during and is dry and gets scratched has been to eye doctor 2 times after anesthesia for this   Depression    Elevated cholesterol    Family history of breast cancer 11/29/2021   Gastro-esophageal reflux disease without esophagitis 12/02/2021   Genetic testing 12/11/2021   [facility] was Negative. Report date is 12/11/2021.     The CancerNext gene panel offered by 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 Authorizing Provider  albuterol (VENTOLIN HFA) 108 (90 Base) MCG/ACT inhaler Inhale 2 puffs into the lungs every 6 (six) hours as needed for wheezing or shortness of breath.    [provider]  ALPRAZolam Prudy Feeler) 0.5 MG tablet Take 0.5 mg by mouth as needed. 09/02/23   [provider]  anastrozole (ARIMIDEX) 1 MG tablet Take 1 tablet (1 mg total) by mouth daily. 09/22/23   Pollyann Samples, NP  BIOTIN PO Take 1 tablet by mouth daily.    [provider]  cephALEXin (KEFLEX) 500 MG capsule Take 1 capsule (500 mg total) by mouth 3 (three) times daily. 01/29/24   Carlean Jews, NP  Fezolinetant (VEOZAH) 45 MG TABS Take 1 tablet (45 mg total) by mouth daily. 01/27/24   Carlean Jews, NP  FIBER PO Take 1 tablet by mouth daily.    [provider]  FLUoxetine (PROZAC) 40 MG capsule Take 40 mg by mouth daily.    [provider]  fluticasone (FLONASE) 50 MCG/ACT nasal spray Place 1-2 sprays into both nostrils daily as needed for allergies or rhinitis.    [provider]  gabapentin (NEURONTIN) 100 MG capsule TAKE ONE CAPSULE BY MOUTH DAILY AT BEDTIME, CAN SLOWLY TITRATE UP TO ONE CAPSULE THREE TIMES DAILY IF TOLERATED 11/12/23   Carlean Jews, NP  lisdexamfetamine (VYVANSE) 40 MG capsule Take 40 mg by mouth daily as needed (concentration). 04/16/23   [provider]  loratadine-pseudoephedrine (CLARITIN-D 24-HOUR) 10-240 MG 24 hr tablet Take 1 tablet by mouth daily.    [provider]  ondansetron (ZOFRAN) 4 MG  tablet Take 1 tablet (4 mg total) by mouth every 8 (eight) hours as needed for nausea or vomiting. 09/04/23   Dorcas Carrow, MD  pantoprazole (PROTONIX) 40 MG tablet Take 1 tablet by mouth daily for 2 weeks, then every other day for 1 week, then as needed. 09/17/23   Cirigliano, [name], DO  Prenatal Vit-Fe Fumarate-FA (PRENATAL MULTIVITAMIN) TABS tablet Take 1 tablet by mouth daily at 12 noon.    [provider]  rOPINIRole (REQUIP) 0.25 MG tablet Take 0.5 mg by mouth at bedtime. 02/27/23   [provider]  rosuvastatin (CRESTOR) 5 MG tablet Take 5 mg by mouth daily. 03/13/23   [provider]  simethicone (MYLICON) 80 MG chewable tablet Chew 1 tablet (80 mg total) by mouth every 6 (six) hours as needed for flatulence (gas, bloating, abdominal discomfort). 09/04/23   Dorcas Carrow, MD  Tapinarof (VTAMA) 1 % CREA Apply 1 Application topically daily.    [provider]  TURMERIC PO Take 1 capsule by mouth daily.    [provider]  zolpidem (AMBIEN) 10 MG tablet Take 10 mg by mouth at bedtime. 12/03/21   [provider]  prochlorperazine (COMPAZINE) 10 MG tablet Take 1 tablet (10 mg total) by mouth every 6 (six) hours as needed (Nausea or vomiting). 02/04/22 07/09/22  Malachy Mood, MD      Allergies    Amoxicillin, Ibuprofen, Ciprofloxacin, and Codeine    Review of Systems   Review of Systems  Musculoskeletal:  Positive for back pain.    Physical Exam Updated Vital Signs BP (!) 130/105 (BP Location: Left Arm)   Pulse 85   Temp 97.7 F (36.5 C) (Oral)   Resp 16   SpO2 98%  Physical Exam Vitals and nursing note reviewed.  Cardiovascular:     Rate and Rhythm: Regular rhythm.  Chest:     Chest wall: No tenderness.  Abdominal:     Tenderness: There is abdominal tenderness.     Comments: Mild suprapubic tenderness.  Musculoskeletal:     Cervical back: Neck supple.     Comments: Mild lumbar tenderness without step-off or deformity.   Neurological:     Mental Status: She is alert and oriented to person, place, and time.     ED Results / Procedures / Treatments   Labs (all labs ordered are listed, but only abnormal results are displayed) Labs Reviewed  COMPREHENSIVE METABOLIC PANEL WITH GFR - Abnormal; Notable for the following components:      Result Value   Glucose, Bld 105 (*)    BUN 21 (*)    All other components within normal limits  URINALYSIS, ROUTINE W REFLEX MICROSCOPIC - Abnormal; Notable for the following components:   Color, Urine AMBER (*)    Nitrite POSITIVE (*)    Bacteria, UA RARE (*)    All other components  within normal limits  URINE CULTURE  CBC WITH DIFFERENTIAL/PLATELET    EKG None  Radiology CT Renal Stone Study Result Date: 02/18/2024 CLINICAL DATA:  Flank pain difficulty urinating EXAM: CT ABDOMEN AND PELVIS WITHOUT CONTRAST TECHNIQUE: Multidetector CT imaging of the abdomen and pelvis was performed following the standard protocol without IV contrast. RADIATION DOSE REDUCTION: This exam was performed according to the departmental dose-optimization program which includes automated exposure control, adjustment of the mA and/or kV according to patient size and/or use of iterative reconstruction technique. COMPARISON:  CT 05/25/2021, PET CT 10/26/2023, chest CT 07/27/2023 FINDINGS: Lower chest: No acute airspace disease. Stable scattered pulmonary nodules, largest measures 5 mm at the right lung base, series 9, image 3. Reference chest CT 07/27/2023. Bilateral breast reconstruction Hepatobiliary: Subcentimeter hypodensities in the liver too small to further characterize. Contracted gallbladder. No calcified stones or biliary dilatation Pancreas: Unremarkable. No pancreatic ductal dilatation or surrounding inflammatory changes. Spleen: Normal in size without focal abnormality. Adrenals/Urinary Tract: Adrenal glands are normal. No hydronephrosis. Small bilateral kidney stones. The bladder is normal  Stomach/Bowel: Stomach is within normal limits. Appendix appears normal. No evidence of bowel wall thickening, distention, or inflammatory changes. Vascular/Lymphatic: Aortic atherosclerosis. No enlarged abdominal or pelvic lymph nodes. Reproductive: Uterus and bilateral adnexa are unremarkable. Other: Negative for pelvic effusion or free air. Small fat containing umbilical hernia Musculoskeletal: No acute or significant osseous findings. IMPRESSION: 1. Negative for hydronephrosis or hydroureter. Small bilateral kidney stones. 2. Aortic atherosclerosis. Electronically Signed   By: Jasmine Pang M.D.   On: 02/18/2024 23:24    Procedures Procedures    Medications Ordered in ED Medications - No data to display  ED Course/ Medical Decision Making/ A&P                                 Medical Decision Making Amount and/or Complexity of Data Reviewed Labs: ordered. Radiology: ordered.   Patient with dysuria and low back pain.  Recently treated for UTI.  Differential diagnosis does include UTI but also causes such as musculoskeletal back pain and kidney stone.  Urinalysis showed nitrite and rare bacteria but no white cells.  Reviewed previous urine culture on 3 weeks ago that showed 20,000 CFU's of group B strep.    Will get urine culture with her dysuria. CT scan pending  Care turned over to Dr Eudelia Bunch   CT negative. Will d/c        Final Clinical Impression(s) / ED Diagnoses Final diagnoses:  Midline low back pain without sciatica, unspecified chronicity  Dysuria    Rx / DC Orders ED Discharge Orders     None         Benjiman Core, MD 02/18/24 [id number]    Benjiman Core, MD 02/18/24 Angela Nevin    Benjiman Core, MD 02/18/24 [phone]

## 2024-02-19 LAB — URINE CULTURE: Culture: <10000 — AB

## 2024-03-23 ENCOUNTER — Other Ambulatory Visit: Payer: Self-pay | Admitting: Nurse Practitioner

## 2024-03-23 ENCOUNTER — Ambulatory Visit: Payer: BC Managed Care – PPO | Admitting: Gastroenterology

## 2024-03-23 ENCOUNTER — Encounter: Payer: Self-pay | Admitting: Gastroenterology

## 2024-03-23 VITALS — BP 150/80 | HR 105 | Ht 63.0 in | Wt 173.0 lb

## 2024-03-23 DIAGNOSIS — Z8719 Personal history of other diseases of the digestive system: Secondary | ICD-10-CM

## 2024-03-23 DIAGNOSIS — K2100 Gastro-esophageal reflux disease with esophagitis, without bleeding: Secondary | ICD-10-CM | POA: Diagnosis not present

## 2024-03-23 DIAGNOSIS — C50212 Malignant neoplasm of upper-inner quadrant of left female breast: Secondary | ICD-10-CM

## 2024-03-23 DIAGNOSIS — Z9889 Other specified postprocedural states: Secondary | ICD-10-CM | POA: Diagnosis not present

## 2024-03-23 DIAGNOSIS — C50911 Malignant neoplasm of unspecified site of right female breast: Secondary | ICD-10-CM

## 2024-03-23 NOTE — Patient Instructions (Signed)
 _______________________________________________________  If your blood pressure at your visit was 140/90 or greater, please contact your primary care physician to follow up on this.  _______________________________________________________  If you are age 57 or older, your body mass index should be between 23-30. Your Body mass index is 30.65 kg/m. If this is out of the aforementioned range listed, please consider follow up with your Primary Care Provider.  If you are age 59 or younger, your body mass index should be between 19-25. Your Body mass index is 30.65 kg/m. If this is out of the aformentioned range listed, please consider follow up with your Primary Care Provider.   ________________________________________________________  The [facility] providers would like to encourage you to use MYCHART to communicate with providers for non-urgent requests or questions.  Due to long hold times on the telephone, sending your provider a message by [facility] may be a faster and more efficient way to get a response.  Please allow 48 business hours for a response.  Please remember that this is for non-urgent requests.  _______________________________________________________  Erika Hodge will follow up in our office on an as needed basis.  It was a pleasure to see you today!  Vito Cirigliano, D.O.

## 2024-03-23 NOTE — Progress Notes (Signed)
 Chief Complaint:    GERD  GI History: 58 y.o. female with a history of breast cancer, initially referred to me by Dr. Nickey Barn for evaluation of antireflux surgery.   Longstanding history of GERD complicated by erosive esophagitis and hiatal hernia with breakthrough symptoms despite multiple acid suppression medications, including Voquenza 20 mg daily.   GERD history: -Index symptoms: Heartburn, regurgitation.  Intermittent dysphagia, nausea/vomiting -Exacerbating features: Overeating -Medications trialed: Prilosec, Nexium, Tums -Current medications: Voquenza 20 mg daily -Complications: Hiatal hernia, EE   GERD evaluation: -Last EGD: 02/12/2023 -Barium esophagram: Ordered today -Esophageal Manometry: None -pH/Impedance: None -Bravo: None   Endoscopic History: - 11/19/2015: EGD (Dr. Tova Fresh): LA Grade C esophagitis, longitudinal furrows in the middle/lower esophagus with EOE score 1 (path: Normal, no elevated eosinophils).  Medium size hiatal hernia.  Moderate gastritis, normal duodenum - 04/07/2018: Colonoscopy (Dr. Tova Fresh): 2 sigmoid HP's, and 2 proximal ascending colon adenomas, 7 mm mid sigmoid polyp removed with hot snare (path: HP).  Normal TI.  Repeat in 5 years - 02/12/2023: EGD (Dr. Nickey Barn): LA Grade D esophagitis, 3 cm HH.  Hill grade 2 valve.  Normal stomach and duodenum.  Started on Voquenza - 09/03/2023: Concomitant laparoscopic hiatal hernia repair and TIF  HPI:     Patient is a 58 y.o. female presenting to the Gastroenterology Clinic for follow-up.  Last seen by me in the office on 09/17/2023 for postoperative follow-up.  Has since weaned off all acid suppression therapy without any breakthrough reflux symptoms.  Tolerating p.o. intake without any issue.  Overall feeling well and very happy with surgical results.    Review of systems:     No chest pain, no SOB, no fevers, no urinary sx   Past Medical History:  Diagnosis Date   Allergic rhinitis 12/02/2021   Anemia    Anemia of  chronic disease 04/08/2022   Anxiety    Bacterial vaginosis 05/25/2023   Bronchiectasis without complication (HCC) 04/07/2022   Cancer (HCC)    Chest pain    Complication of anesthesia    HA  One eye dosent close during and is dry and gets scratched has been to eye doctor 2 times after anesthesia for this   Depression    Elevated cholesterol    Family history of breast cancer 11/29/2021   Gastro-esophageal reflux disease without esophagitis 12/02/2021   Genetic testing 12/11/2021   [facility] was Negative. Report date is 12/11/2021.     The CancerNext gene panel offered by 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     anastrozole  (ARIMIDEX ) 1 MG tablet Take 1 tablet by mouth daily. 30 tablet 3    BIOTIN PO Take 1 tablet by mouth daily.     Fezolinetant  (VEOZAH ) 45 MG TABS Take 1 tablet (45 mg total) by mouth daily. 30 tablet 3   FIBER PO Take 1 tablet by mouth daily.     FLUoxetine  (PROZAC ) 40 MG capsule Take 40 mg by mouth daily.     fluticasone  (FLONASE ) 50 MCG/ACT nasal spray Place 1-2 sprays into both nostrils daily as needed for allergies or rhinitis.     gabapentin  (NEURONTIN ) 100 MG capsule TAKE ONE CAPSULE BY MOUTH DAILY AT BEDTIME, CAN SLOWLY TITRATE UP TO ONE CAPSULE THREE TIMES DAILY IF TOLERATED 60 capsule 2   lisdexamfetamine (VYVANSE) 40 MG capsule Take 40 mg by mouth daily as needed (concentration).     loratadine-pseudoephedrine (CLARITIN-D 24-HOUR) 10-240 MG 24 hr tablet Take 1 tablet by mouth daily.     rOPINIRole  (REQUIP ) 0.25 MG tablet Take 0.5 mg by mouth at bedtime.     TURMERIC PO Take 1 capsule by mouth daily.     ALPRAZolam  (XANAX ) 0.5 MG tablet Take 0.5 mg by mouth as needed. (Patient not taking: Reported on 03/23/2024)     cephALEXin  (KEFLEX ) 500 MG capsule Take 1 capsule (500 mg total) by mouth 3 (three) times daily. (Patient not taking: Reported on 03/23/2024) 21 capsule 0   ondansetron  (ZOFRAN ) 4 MG tablet Take 1 tablet (4 mg total) by mouth every 8 (eight) hours as needed for nausea or vomiting. (Patient not taking: Reported on 03/23/2024) 20 tablet 0   pantoprazole  (PROTONIX ) 40 MG tablet Take 1 tablet by mouth daily for 2 weeks, then every other day for 1 week, then as needed. (Patient not taking: Reported on 03/23/2024)     Prenatal Vit-Fe Fumarate-FA (PRENATAL MULTIVITAMIN) TABS tablet Take 1 tablet by mouth daily at 12 noon. (Patient not taking: Reported on 03/23/2024)     rosuvastatin  (CRESTOR ) 5 MG tablet Take 5 mg by mouth daily. (Patient not taking: Reported on 03/23/2024)     simethicone  (MYLICON) 80 MG chewable tablet Chew 1 tablet (80 mg total) by mouth every 6 (six) hours as needed for flatulence (gas, bloating, abdominal discomfort). (Patient not  taking: Reported on 03/23/2024) 30 tablet 0   Tapinarof (VTAMA) 1 % CREA Apply 1 Application topically daily. (Patient not taking: Reported on 03/23/2024)     zolpidem  (AMBIEN ) 10 MG tablet Take 10 mg by mouth at bedtime. (Patient not taking: Reported on 03/23/2024)     Current Facility-Administered Medications  Medication Dose Route Frequency Provider Last Rate Last Admin   oxyCODONE -acetaminophen  (PERCOCET/ROXICET) 5-325 MG per tablet 1 tablet  1 tablet Oral Q6H PRN         Physical Exam:     BP (!) 150/80   Pulse (!) 105   Ht 5\' 3"  (1.6 m)   Wt 173 lb (78.5 kg)   BMI 30.65 kg/m   GENERAL:  Pleasant female in NAD PSYCH: : Cooperative, normal affect Musculoskeletal:  Normal muscle tone, normal strength NEURO: Alert and oriented x 3, no focal neurologic deficits   IMPRESSION and PLAN:    1) GERD with erosive esophagitis 2) Hiatal hernia - Corrected with laparoscopic hernia repair 3)  s/p TIF Has done very well since cTIF in 08/2023 with resolution of reflux symptoms.  No longer taking any acid suppression therapy.  Tolerating p.o. intake without issue.  - Can follow-up with me on an as-needed basis.  Otherwise we will release her GI care back to her primary Gastroenterologist, Dr. Nickey Barn.     Annis Kinder ,DO, FACG 03/23/2024, 3:19 PM

## 2024-03-25 ENCOUNTER — Other Ambulatory Visit: Payer: Self-pay | Admitting: Nurse Practitioner

## 2024-04-01 ENCOUNTER — Telehealth: Payer: Self-pay | Admitting: Nurse Practitioner

## 2024-04-13 ENCOUNTER — Inpatient Hospital Stay: Attending: Hematology

## 2024-04-13 ENCOUNTER — Other Ambulatory Visit: Payer: Self-pay

## 2024-04-13 ENCOUNTER — Other Ambulatory Visit: Payer: Self-pay | Admitting: Nurse Practitioner

## 2024-04-13 ENCOUNTER — Inpatient Hospital Stay: Admitting: Nurse Practitioner

## 2024-04-13 VITALS — BP 130/72 | HR 92 | Temp 97.7°F | Resp 17 | Wt 173.2 lb

## 2024-04-13 DIAGNOSIS — C50919 Malignant neoplasm of unspecified site of unspecified female breast: Secondary | ICD-10-CM

## 2024-04-13 DIAGNOSIS — C50212 Malignant neoplasm of upper-inner quadrant of left female breast: Secondary | ICD-10-CM | POA: Insufficient documentation

## 2024-04-13 DIAGNOSIS — Z170 Estrogen receptor positive status [ER+]: Secondary | ICD-10-CM | POA: Insufficient documentation

## 2024-04-13 DIAGNOSIS — Z79811 Long term (current) use of aromatase inhibitors: Secondary | ICD-10-CM | POA: Insufficient documentation

## 2024-04-13 LAB — CBC WITH DIFFERENTIAL (CANCER CENTER ONLY)
Abs Immature Granulocytes: 0.02 10*3/uL (ref 0.00–0.07)
Basophils Absolute: 0.1 10*3/uL (ref 0.0–0.1)
Basophils Relative: 1 %
Eosinophils Absolute: 0.5 10*3/uL (ref 0.0–0.5)
Eosinophils Relative: 6 %
HCT: 36.6 % (ref 36.0–46.0)
Hemoglobin: 11.8 g/dL — ABNORMAL LOW (ref 12.0–15.0)
Immature Granulocytes: 0 %
Lymphocytes Relative: 15 %
Lymphs Abs: 1.1 10*3/uL (ref 0.7–4.0)
MCH: 29.9 pg (ref 26.0–34.0)
MCHC: 32.2 g/dL (ref 30.0–36.0)
MCV: 92.7 fL (ref 80.0–100.0)
Monocytes Absolute: 0.8 10*3/uL (ref 0.1–1.0)
Monocytes Relative: 10 %
Neutro Abs: 5.1 10*3/uL (ref 1.7–7.7)
Neutrophils Relative %: 68 %
Platelet Count: 360 10*3/uL (ref 150–400)
RBC: 3.95 MIL/uL (ref 3.87–5.11)
RDW: 13 % (ref 11.5–15.5)
WBC Count: 7.5 10*3/uL (ref 4.0–10.5)
nRBC: 0 % (ref 0.0–0.2)

## 2024-04-13 LAB — CMP (CANCER CENTER ONLY)
ALT: 17 U/L (ref 0–44)
AST: 22 U/L (ref 15–41)
Albumin: 4.3 g/dL (ref 3.5–5.0)
Alkaline Phosphatase: 78 U/L (ref 38–126)
Anion gap: 5 (ref 5–15)
BUN: 11 mg/dL (ref 6–20)
CO2: 30 mmol/L (ref 22–32)
Calcium: 9.8 mg/dL (ref 8.9–10.3)
Chloride: 103 mmol/L (ref 98–111)
Creatinine: 0.8 mg/dL (ref 0.44–1.00)
GFR, Estimated: >60 mL/min (ref >60)
Glucose, Bld: 101 mg/dL — ABNORMAL HIGH (ref 70–99)
Potassium: 4.1 mmol/L (ref 3.5–5.1)
Sodium: 138 mmol/L (ref 135–145)
Total Bilirubin: 0.3 mg/dL (ref 0.0–1.2)
Total Protein: 7.5 g/dL (ref 6.5–8.1)

## 2024-04-13 LAB — GENETIC SCREENING ORDER

## 2024-04-13 NOTE — Progress Notes (Signed)
 Patient Care Team: Georgean Kindle, MD as PCP - General (Internal Medicine) Sim Dryer, MD as Consulting Physician (General Surgery) Sonja Belfry, MD as Consulting Physician (Hematology) Retta Caster, MD as Consulting Physician (Radiation Oncology) Annis Kinder, DO as Consulting Physician (Gastroenterology)  Clinic Day:  04/13/2024  Referring physician: Georgean Kindle, MD  ASSESSMENT & PLAN:   Assessment & Plan: Malignant neoplasm of upper-inner quadrant of left breast in female, estrogen receptor positive (HCC) -found on screening mammogram. 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 -she opted to proceed with b/l mastectomies with reconstruction on 01/07/22 with Dr. Afton Horse and Dr. Marieta Shorten. Pathology revealed b/l breast cancers. Left breast showed 2.1 cm invasive and in situ ductal carcinoma. Margins and lymph nodes negative (0/2). Repeated ER/PR/HER2 were all negative (ER 10% weak (+) on biopsy) -she developed left IMF necrotic tissue, excised by Dr. Marieta Shorten on 01/27/22. -given prior treatment with adriamycin, she completed 4 cycles TC 02/12/22 - 06/06/22, tolerated fairly  -we reviewed that adjuvant radiation is not recommended due to her previous radiation for lymphoma. -She started adjuvant anastrozole  in July 2024, with significant hot flashes. -PET scan from December 2024 was benign with stable FDG avid thyroid  nodules. -Trial Veozah  45 mg daily to help with hot flashes.  She was unable to get this filled.  Co-pay was very expensive and medication not covered by her insurance. -Bilateral breast MRI to be scheduled in December 2025 for breast cancer surveillance. -Genetic screening and ctDNA Signatera to be done this week.  Will contact patient with results. -Signatera testing, labs, and follow-up in 3 months.    Plan Labs reviewed. - Mild and stable anemia.  Hgb 11.8 and HCT 36.6. - CMP unremarkable - CA 27-29 normal at 16.6. Genetic screening today with  ctDNA Signatera testing to be completed in the next week. Repeat bilateral breast MRI in December 2025. Labs with follow-up in 3 months, sooner if needed.  The patient understands the plans discussed today and is in agreement with them.  She knows to contact our office if she develops concerns prior to her next appointment.  I provided 25 minutes of face-to-face time during this encounter and > 50% was spent counseling as documented under my assessment and plan.    Sharyon Deis, NP  [facility] - A DEPT OF [facility]. [facility] [address] [place] [address] Dept: [phone] Dept Fax: [phone]   No orders of the defined types were placed in this encounter.     CHIEF COMPLAINT:  CC: left breast cancer, estrogen receptor positive   Current Treatment:  anastrozole  daily   INTERVAL HISTORY:  Ryleah is here today for repeat clinical assessment. She was last seen by me on 01/27/2024.  Was unable to get prescription for Veozah  to help with hot flashes as it was too expensive and not covered by her insurance.  Hot flashes do continue.  She does continue anastrozole  every day.  She denies chest pain, chest pressure, or shortness of breath. She denies headaches or visual disturbances. She denies abdominal pain, nausea, vomiting, or changes in bowel or bladder habits.    She denies fevers or chills. She denies pain. Her appetite is good. Her weight has been stable.  I have reviewed the past medical history, past surgical history, social history and family history with the patient and they are unchanged from previous note.  ALLERGIES:  is allergic to amoxicillin,  ibuprofen, ciprofloxacin , and codeine.  MEDICATIONS:  Current Outpatient Medications  Medication Sig Dispense Refill   albuterol  (VENTOLIN  HFA) 108 (90 Base) MCG/ACT inhaler Inhale 2 puffs into the lungs every 6 (six) hours as needed for wheezing or shortness of  breath.     anastrozole  (ARIMIDEX ) 1 MG tablet Take 1 tablet by mouth daily. 30 tablet 3   BIOTIN PO Take 1 tablet by mouth daily.     FIBER PO Take 1 tablet by mouth daily.     FLUoxetine  (PROZAC ) 40 MG capsule Take 40 mg by mouth daily.     fluticasone  (FLONASE ) 50 MCG/ACT nasal spray Place 1-2 sprays into both nostrils daily as needed for allergies or rhinitis.     gabapentin  (NEURONTIN ) 100 MG capsule TAKE ONE CAPSULE BY MOUTH DAILY AT BEDTIME, CAN SLOWLY TITRATE UP TO ONE CAPSULE THREE TIMES DAILY IF TOLERATED 60 capsule 2   lisdexamfetamine (VYVANSE) 40 MG capsule Take 40 mg by mouth daily as needed (concentration).     Prenatal Vit-Fe Fumarate-FA (PRENATAL MULTIVITAMIN) TABS tablet Take 1 tablet by mouth daily at 12 noon. (Patient not taking: Reported on 03/23/2024)     rOPINIRole  (REQUIP ) 0.25 MG tablet Take 0.5 mg by mouth at bedtime.     TURMERIC PO Take 1 capsule by mouth daily.     Current Facility-Administered Medications  Medication Dose Route Frequency Provider Last Rate Last Admin   oxyCODONE -acetaminophen  (PERCOCET/ROXICET) 5-325 MG per tablet 1 tablet  1 tablet Oral Q6H PRN         HISTORY OF PRESENT ILLNESS:   Oncology History Overview Note   Cancer Staging  Invasive lobular carcinoma of right breast, stage 1 (HCC) Staging form: Breast, AJCC 8th Edition - Clinical stage from 01/07/2022: Stage Unknown (cT1c, cNX, cM0, G1, ER+, PR+, HER2-) - Signed by Sonja Ames, MD on 01/24/2022  Malignant neoplasm of upper-inner quadrant of left breast in female, estrogen receptor positive (HCC) Staging form: Breast, AJCC 8th Edition - Clinical stage from 11/20/2021: Stage IA (cT1c, cN0, cM0, G2, ER+, PR-, HER2-) - Signed by Sonja [place], MD on 11/26/2021 - Pathologic stage from 01/07/2022: Stage IIA (pT2, pN0, cM0, G2, ER-, PR-, HER2-) - Signed by Sonja [place], MD on 01/24/2022     Malignant neoplasm of upper-inner quadrant of left breast in female, estrogen receptor positive (HCC)  11/05/2021  Mammogram   CLINICAL DATA:  Screening recall for a possible left breast asymmetry.   EXAM: DIGITAL DIAGNOSTIC UNILATERAL LEFT MAMMOGRAM WITH TOMOSYNTHESIS AND CAD; ULTRASOUND LEFT BREAST LIMITED  IMPRESSION: 1. Irregular 1.2 cm mass in the left breast at 11 o'clock, 2 cm the nipple, suspicious for malignancy.   11/20/2021 Cancer Staging   Staging form: Breast, AJCC 8th Edition - Clinical stage from 11/20/2021: Stage IA (cT1c, cN0, cM0, G2, ER+, PR-, HER2-) - Signed by Sonja [place], MD on 11/26/2021 Stage prefix: Initial diagnosis Histologic grading system: 3 grade system   11/20/2021 Initial Biopsy   Diagnosis Breast, left, needle core biopsy, 11 o'clock, ribbon clip - INVASIVE DUCTAL CARCINOMA - DUCTAL CARCINOMA IN SITU - SEE COMMENT Microscopic Comment based on the biopsy, the carcinoma appears Nottingham grade 2 of 3 and measures 0.6 cm in greatest linear extent.  PROGNOSTIC INDICATORS Results: The tumor cells are NEGATIVE for Her2 (1+). Estrogen Receptor: 10%, POSITIVE, WEAK STAINING INTENSITY Progesterone Receptor: 0%, NEGATIVE Proliferation Marker Ki67: 20%   11/26/2021 Initial Diagnosis   Malignant neoplasm of upper-inner quadrant of left breast in female, estrogen receptor positive (HCC)  11/27/2021 Genetic Testing   [facility] CancerNext was Negative. Report date is 12/11/2021.  The CancerNext gene panel offered by [employer] includes sequencing, rearrangement analysis, and RNA analysis for the following 36 genes:   APC, ATM, AXIN2, BARD1, BMPR1A, BRCA1, BRCA2, BRIP1, CDH1, CDK4, CDKN2A, CHEK2, DICER1, HOXB13, EPCAM, GREM1, MLH1, MSH2, MSH3, MSH6, MUTYH, NBN, NF1, NTHL1, PALB2, PMS2, POLD1, POLE, PTEN, RAD51C, RAD51D, RECQL, SMAD4, SMARCA4, STK11, and TP53.    01/07/2022 Cancer Staging   Staging form: Breast, AJCC 8th Edition - Pathologic stage from 01/07/2022: Stage IIA (pT2, pN0, cM0, G2, ER-, PR-, HER2-) - Signed by Sonja [place], MD on 01/24/2022 Stage prefix: Initial  diagnosis Histologic grading system: 3 grade system Residual tumor (R): R0 - None   01/07/2022 Definitive Surgery   FINAL MICROSCOPIC DIAGNOSIS:   A. BREAST, RIGHT NIPPLE, BIOPSY:  - Benign breast tissue.  - No malignancy identified.   B. BREAST, LEFT NIPPLE, BIOPSY:  - Benign breast tissue.  - No malignancy identified.   C. BREAST, RIGHT, MASTECTOMY:  - Invasive lobular carcinoma, 1.1 cm.  - Ductal carcinoma in situ, 1 cm.  - Invasive carcinoma focally extends to the anterior margin.  - Fibrocystic changes with usual ductal hyperplasia and calcifications.  - Sclerosing adenosis and fibroadenomatoid change.  - See oncology table and comment.   D. LYMPH NODE, LEFT AXILLARY, SENTINEL, EXCISION:  - One lymph node negative for metastatic carcinoma (0/1).   E. LYMPH NODE, LEFT AXILLARY, SENTINEL, EXCISION:  - One lymph node negative for metastatic carcinoma (0/1).   F. BREAST, LEFT, MASTECTOMY:  - Invasive and in situ ductal carcinoma, 2.1 cm.  - Margins negative for carcinoma.  - See oncology table.    01/07/2022 Receptors her2   F1. PROGNOSTIC INDICATOR RESULTS:   The tumor cells are NEGATIVE for Her2 (0).   Estrogen Receptor: NEGATIVE  Progesterone Receptor: NEGATIVE  Proliferation Marker Ki-67: 10%    02/12/2022 - 06/06/2022 Chemotherapy   Patient is on Treatment Plan : BREAST TC q21d     08/2022 -  Anti-estrogen oral therapy   Tamoxifen    11/01/2023 PET scan   IMPRESSION: Status post bilateral mastectomy with reconstruction.  No findings suspicious for recurrent or metastatic disease.  Small bilateral pulmonary nodules, unchanged, likely benign.  7 mm left thyroid  nodule, FDG avid. Recommend thyroid  US  and biopsy   Invasive lobular carcinoma of right breast, stage 1 (HCC)  11/27/2021 Genetic Testing   [facility] CancerNext was Negative. Report date is 12/11/2021.  The CancerNext gene panel offered by [employer] includes sequencing, rearrangement analysis, and RNA  analysis for the following 36 genes:   APC, ATM, AXIN2, BARD1, BMPR1A, BRCA1, BRCA2, BRIP1, CDH1, CDK4, CDKN2A, CHEK2, DICER1, HOXB13, EPCAM, GREM1, MLH1, MSH2, MSH3, MSH6, MUTYH, NBN, NF1, NTHL1, PALB2, PMS2, POLD1, POLE, PTEN, RAD51C, RAD51D, RECQL, SMAD4, SMARCA4, STK11, and TP53.    01/07/2022 Cancer Staging   Staging form: Breast, AJCC 8th Edition - Clinical stage from 01/07/2022: Stage Unknown (cT1c, cNX, cM0, G1, ER+, PR+, HER2-) - Signed by Sonja [place], MD on 01/24/2022 Stage prefix: Initial diagnosis Histologic grading system: 3 grade system   01/07/2022 Definitive Surgery   FINAL MICROSCOPIC DIAGNOSIS:   A. BREAST, RIGHT NIPPLE, BIOPSY:  - Benign breast tissue.  - No malignancy identified.   B. BREAST, LEFT NIPPLE, BIOPSY:  - Benign breast tissue.  - No malignancy identified.   C. BREAST, RIGHT, MASTECTOMY:  - Invasive lobular carcinoma, 1.1 cm.  - Ductal carcinoma in situ, 1 cm.  -  Invasive carcinoma focally extends to the anterior margin.  - Fibrocystic changes with usual ductal hyperplasia and calcifications.  - Sclerosing adenosis and fibroadenomatoid change.  - See oncology table and comment.   D. LYMPH NODE, LEFT AXILLARY, SENTINEL, EXCISION:  - One lymph node negative for metastatic carcinoma (0/1).   E. LYMPH NODE, LEFT AXILLARY, SENTINEL, EXCISION:  - One lymph node negative for metastatic carcinoma (0/1).   F. BREAST, LEFT, MASTECTOMY:  - Invasive and in situ ductal carcinoma, 2.1 cm.  - Margins negative for carcinoma.  - See oncology table.    01/07/2022 Receptors her2   C7.  PROGNOSTIC INDICATOR RESULTS:   The tumor cells are NEGATIVE for Her2 (1+).   Estrogen Receptor: POSITIVE, 30% WEAK STAINING INTENSITY  Progesterone Receptor: POSITIVE, 80% MODERATE STAINING INTENSITY  Proliferation Marker Ki-67: <5%    01/24/2022 Initial Diagnosis   Invasive lobular carcinoma of right breast, stage 1 (HCC)   08/2022 -  Anti-estrogen oral therapy   Tamoxifen         REVIEW OF SYSTEMS:   Constitutional: Denies fevers, chills or abnormal weight loss Eyes: Denies blurriness of vision Ears, nose, mouth, throat, and face: Denies mucositis or sore throat Respiratory: Denies cough, dyspnea or wheezes Cardiovascular: Denies palpitation, chest discomfort or lower extremity swelling Gastrointestinal:  Denies nausea, heartburn or change in bowel habits Skin: Denies abnormal skin rashes Lymphatics: Denies new lymphadenopathy or easy bruising Neurological:Denies numbness, tingling or new weaknesses Behavioral/Psych: Mood is stable, no new changes  All other systems were reviewed with the patient and are negative.   VITALS:   Today's Vitals   04/13/24 1419  BP: 130/72  Pulse: 92  Resp: 17  Temp: 97.7 F (36.5 C)  SpO2: 96%  Weight: 173 lb 3.2 oz (78.6 kg)   Body mass index is 30.68 kg/m.    Wt Readings from Last 3 Encounters:  04/18/24 170 lb 6 oz (77.3 kg)  04/13/24 173 lb 3.2 oz (78.6 kg)  03/23/24 173 lb (78.5 kg)    Body mass index is 30.68 kg/m.  Performance status (ECOG): 1 - Symptomatic but completely ambulatory  PHYSICAL EXAM:   GENERAL:alert, no distress and comfortable SKIN: skin color, texture, turgor are normal, no rashes or significant lesions EYES: normal, Conjunctiva are pink and non-injected, sclera clear OROPHARYNX:no exudate, no erythema and lips, buccal mucosa, and tongue normal  NECK: supple, thyroid  normal size, non-tender, without nodularity LYMPH:  no palpable lymphadenopathy in the cervical, axillary or inguinal LUNGS: clear to auscultation and percussion with normal breathing effort HEART: regular rate & rhythm and no murmurs and no lower extremity edema ABDOMEN:abdomen soft, non-tender and normal bowel sounds Musculoskeletal:no cyanosis of digits and no clubbing  NEURO: alert & oriented x 3 with fluent speech, no focal motor/sensory deficits  LABORATORY DATA:  I have reviewed the data as listed     Component Value Date/Time   NA 138 04/13/2024 1346   K 4.1 04/13/2024 1346   CL 103 04/13/2024 1346   CO2 30 04/13/2024 1346   GLUCOSE 101 (H) 04/13/2024 1346   BUN 11 04/13/2024 1346   CREATININE 0.80 04/13/2024 1346   CALCIUM  9.8 04/13/2024 1346   PROT 7.5 04/13/2024 1346   ALBUMIN 4.3 04/13/2024 1346   AST 22 04/13/2024 1346   ALT 17 04/13/2024 1346   ALKPHOS 78 04/13/2024 1346   BILITOT 0.3 04/13/2024 1346   GFRNONAA >60 04/13/2024 1346    Lab Results  Component Value Date   WBC 7.5 04/13/2024  NEUTROABS 5.1 04/13/2024   HGB 11.8 (L) 04/13/2024   HCT 36.6 04/13/2024   MCV 92.7 04/13/2024   PLT 360 04/13/2024

## 2024-04-13 NOTE — Assessment & Plan Note (Addendum)
-  found on screening mammogram. 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 -she opted to proceed with b/l mastectomies with reconstruction on 01/07/22 with Dr. Afton Horse and Dr. Marieta Shorten. Pathology revealed b/l breast cancers. Left breast showed 2.1 cm invasive and in situ ductal carcinoma. Margins and lymph nodes negative (0/2). Repeated ER/PR/HER2 were all negative (ER 10% weak (+) on biopsy) -she developed left IMF necrotic tissue, excised by Dr. Marieta Shorten on 01/27/22. -given prior treatment with adriamycin, she completed 4 cycles TC 02/12/22 - 06/06/22, tolerated fairly  -we reviewed that adjuvant radiation is not recommended due to her previous radiation for lymphoma. -She started adjuvant anastrozole  in July 2024, with significant hot flashes. -PET scan from December 2024 was benign with stable FDG avid thyroid  nodules. -Trial Veozah  45 mg daily to help with hot flashes.  She was unable to get this filled.  Co-pay was very expensive and medication not covered by her insurance. -Bilateral breast MRI to be scheduled in December 2025 for breast cancer surveillance. -Genetic screening and ctDNA Signatera to be done this week.  Will contact patient with results. -Signatera testing, labs, and follow-up in 3 months.

## 2024-04-14 LAB — CANCER ANTIGEN 27.29: CA 27.29: 16.6 U/mL (ref 0.0–38.6)

## 2024-04-15 ENCOUNTER — Ambulatory Visit: Payer: Self-pay | Admitting: Nurse Practitioner

## 2024-04-18 ENCOUNTER — Ambulatory Visit: Payer: BC Managed Care – PPO | Attending: Surgery

## 2024-04-18 ENCOUNTER — Other Ambulatory Visit: Payer: Self-pay

## 2024-04-18 VITALS — Wt 170.4 lb

## 2024-04-18 DIAGNOSIS — Z483 Aftercare following surgery for neoplasm: Secondary | ICD-10-CM | POA: Insufficient documentation

## 2024-04-18 NOTE — Therapy (Signed)
 OUTPATIENT PHYSICAL THERAPY SOZO SCREENING NOTE   Patient Name: Erika Hodge MRN: [id number] DOB:08-25-1966, 58 y.o., female Today's Date: 04/18/2024  PCP: Georgean Kindle, MD REFERRING PROVIDER: Sim Dryer, MD   PT End of Session - 04/18/24 1631     Visit Number 22   # unchanged due to screen only   PT Start Time 1629    PT Stop Time 1632    PT Time Calculation (min) 3 min    Activity Tolerance Patient tolerated treatment well    Behavior During Therapy [facility] for tasks assessed/performed             Past Medical History:  Diagnosis Date   Allergic rhinitis 12/02/2021   Anemia    Anemia of chronic disease 04/08/2022   Anxiety    Bacterial vaginosis 05/25/2023   Bronchiectasis without complication (HCC) 04/07/2022   Cancer (HCC)    Chest pain    Complication of anesthesia    HA  One eye dosent close during and is dry and gets scratched has been to eye doctor 2 times after anesthesia for this   Depression    Elevated cholesterol    Family history of breast cancer 11/29/2021   Gastro-esophageal reflux disease without esophagitis 12/02/2021   Genetic testing 12/11/2021   [employer] CancerNext was Negative. Report date is 12/11/2021.     The CancerNext gene panel offered by 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   Menorrhagia 05/25/2023   Neutropenic fever (HCC)  04/07/2022   Non Hodgkin's lymphoma (HCC)    Obesity (BMI 30-39.9) 04/08/2022   Psoriasis    Pulmonary nodules 04/07/2022   Restless leg syndrome 04/08/2022   Vitamin D deficiency 05/25/2023   Past Surgical History:  Procedure Laterality Date   BREAST BIOPSY Left 11/20/2021   BREAST RECONSTRUCTION WITH PLACEMENT OF TISSUE EXPANDER AND ALLODERM Bilateral 01/07/2022   Procedure: BILATERAL BREAST RECONSTRUCTION WITH PLACEMENT OF TISSUE EXPANDERS AND ALLODERM;  Surgeon: Alger Infield, MD;  Location: [facility];  Service: Plastics;  Laterality: Bilateral;   COLONOSCOPY  04/07/2018   [facility]. Dr. Tami Falcon. Four small polyps, 2 in the sigmoid colon and 2 in teh proximal ascending colon - removed by cold biopsies. One 7 mm sessile polyps in the mid sigmoid colon, removed with a hot snare x 1; resected and retrived. The examined portion of the ileum was normal. The ecamination was otherwise normal on direct and retroflexion views.   ESOPHAGOGASTRODUODENOSCOPY  11/19/2015   [facility]. Dr. Tami Falcon. LA Grade C reflux esophagitis. Esophageal mucosal changes suggestive of Eosinophilic esophagitis noted in the distal 2/3 of the esophagus-biopsies done. Medium-sized hiatal hernia noted on retroplexion. Moderate diffuse gastritis. Normal examined duodenum.   ESOPHAGOGASTRODUODENOSCOPY  02/12/2023   [facility]. Dr. Alvis Jourdain.  LA Grade D reflux esophagitis with no bleeding. 3 cm hiatal hernia. Gastrophageal flap valve classified as  Hill Grade II ( fold present, opens with respiration). Normal stomach. Normal examined duodenum. No specimens collected.   ESOPHAGOGASTRODUODENOSCOPY N/A 09/03/2023   Procedure: ESOPHAGOGASTRODUODENOSCOPY (EGD);  Surgeon: Annis Kinder, DO;  Location: [facility];  Service: Gastroenterology;  Laterality: N/A;   HIATAL HERNIA REPAIR N/A 09/03/2023   Procedure: LAPAROSCOPIC REPAIR OF SLIDING HIATAL HERNIA;   Surgeon: Aldean Hummingbird, MD;  Location: [facility];  Service: General;  Laterality: N/A;  60 MIN   NIPPLE SPARING MASTECTOMY Right 01/07/2022   Procedure: RIGHT NIPPLE SPARING MASTECTOMY;  Surgeon: Sim Dryer, MD;  Location: [facility];  Service: General;  Laterality: Right;   NIPPLE SPARING MASTECTOMY WITH SENTINEL LYMPH NODE BIOPSY Left 01/07/2022   Procedure: LEFT NIPPLE SPARING MASTECTOMY WITH SENTINEL LYMPH NODE BIOPSY;  Surgeon: Sim Dryer, MD;  Location: [facility];  Service: General;  Laterality: Left;   REMOVAL OF BILATERAL TISSUE EXPANDERS WITH PLACEMENT OF BILATERAL BREAST IMPLANTS Bilateral 07/15/2022   Procedure: REMOVAL OF BILATERAL TISSUE EXPANDERS WITH PLACEMENT OF BILATERAL BREAST IMPLANTS;  Surgeon: Alger Infield, MD;  Location: [facility];  Service: Plastics;  Laterality: Bilateral;   SAVORY DILATION  09/03/2023   Procedure: SAVORY DILATION;  Surgeon: Annis Kinder, DO;  Location: [facility];  Service: Gastroenterology;;   TRANSORAL INCISIONLESS FUNDOPLICATION N/A 09/03/2023   Procedure: TRANSORAL INCISIONLESS FUNDOPLICATION;  Surgeon: Annis Kinder, DO;  Location: [facility];  Service: Gastroenterology;  Laterality: N/A;   TUBAL LIGATION     Patient Active Problem List   Diagnosis Date Noted   Hiatal hernia 09/03/2023   Hiatal hernia with GERD and esophagitis 09/03/2023   Elevated liver enzymes 08/09/2023   Abdominal bloating 05/26/2023   Constipation 05/26/2023   Chronic idiopathic constipation 05/26/2023   Coffee ground emesis 05/26/2023   Dysphagia 05/26/2023   Gastro-esophageal reflux disease with esophagitis 05/26/2023   History of colonic polyps 05/26/2023   Vomiting without nausea 05/26/2023   Bacterial vaginosis 05/25/2023   Menorrhagia 05/25/2023   Vitamin D deficiency 05/25/2023   Anemia    Cancer (HCC)    Chest pain    Complication of anesthesia    Depression    Dyspnea    Elevated cholesterol     Jaw pain    Non Hodgkin's lymphoma (HCC)    Psoriasis    Hypokalemia 04/08/2022   Anemia of chronic disease 04/08/2022   Restless leg syndrome 04/08/2022   Obesity (BMI 30-39.9) 04/08/2022   Bronchiectasis without complication (HCC) 04/07/2022   Neutropenic fever (HCC) 04/07/2022   Pulmonary nodules 04/07/2022   Anxiety    Hiatal hernia with GERD    Invasive lobular carcinoma of right breast, stage 1 (HCC) 01/24/2022   Genetic testing 12/11/2021   Gastro-esophageal reflux disease without esophagitis 12/02/2021   Allergic rhinitis 12/02/2021   History of therapeutic radiation 12/02/2021   Family history of breast cancer 11/29/2021   Malignant neoplasm of upper-inner quadrant of left breast in female, estrogen receptor positive (HCC) 11/26/2021    REFERRING DIAG: left breast cancer at risk for lymphedema  THERAPY DIAG: Aftercare following surgery for neoplasm  PERTINENT HISTORY: Patient was diagnosed on 09/10/2021 with 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 Two negative axillary nodes were removed on the left side. An incidental finding of right breast invasive lobular carcinoma was also found and is ER/PR positive and HER2 negative. It is weakly ER positive, PR negative and  HER2 negative with a Ki67 of 20%. She has a left wrist plate from a previous surgery in 2006 and a history of non-Hodgkins Lymphoma in 1985.   PRECAUTIONS: left UE Lymphedema risk, None  SUBJECTIVE: Pt returns for her first 6 month L-Dex screen.   PAIN:  Are you having pain? No  SOZO SCREENING: Patient was assessed today using the SOZO machine to determine the lymphedema index score. This was compared to her baseline score. It was determined that she is within the recommended range when compared to her baseline and no further action is needed at this time. She will continue SOZO  screenings. These are done every 3 months for 2 years post operatively followed by every 6 months for 2 years, and then annually.   L-DEX FLOWSHEETS - 04/18/24 1600       L-DEX LYMPHEDEMA SCREENING   Measurement Type Unilateral    L-DEX MEASUREMENT EXTREMITY Upper Extremity    POSITION  Standing    DOMINANT SIDE Right    At Risk Side Left    BASELINE SCORE (UNILATERAL) 6.4    L-DEX SCORE (UNILATERAL) 6.7    VALUE CHANGE (UNILAT) 0.3            P: Cont 6 month.    Denyce Flank, PTA 04/18/2024, 4:32 PM

## 2024-04-23 LAB — SIGNATERA
SIGNATERA MTM READOUT: 0 MTM/ml
SIGNATERA TEST RESULT: NEGATIVE

## 2024-04-24 ENCOUNTER — Encounter: Payer: Self-pay | Admitting: Nurse Practitioner

## 2024-04-26 ENCOUNTER — Other Ambulatory Visit: Payer: Self-pay

## 2024-05-25 ENCOUNTER — Other Ambulatory Visit: Payer: BC Managed Care – PPO

## 2024-05-25 ENCOUNTER — Other Ambulatory Visit: Payer: Self-pay

## 2024-05-27 LAB — SURGICAL PATHOLOGY

## 2024-05-30 ENCOUNTER — Other Ambulatory Visit

## 2024-05-30 ENCOUNTER — Ambulatory Visit: Admitting: Nurse Practitioner

## 2024-06-16 ENCOUNTER — Other Ambulatory Visit: Payer: Self-pay | Admitting: Nurse Practitioner

## 2024-07-12 ENCOUNTER — Other Ambulatory Visit: Payer: Self-pay

## 2024-07-12 DIAGNOSIS — C50212 Malignant neoplasm of upper-inner quadrant of left female breast: Secondary | ICD-10-CM

## 2024-07-12 NOTE — Progress Notes (Signed)
 Patient Care Team: Pia Kerney SQUIBB, MD as PCP - General (Internal Medicine) Vanderbilt Ned, MD as Consulting Physician (General Surgery) Lanny Callander, MD as Consulting Physician (Hematology) Shannon Agent, MD as Consulting Physician (Radiation Oncology) San Sandor GAILS, DO as Consulting Physician (Gastroenterology)  Clinic Day:  07/13/2024  Referring physician: Pia Kerney SQUIBB, MD  ASSESSMENT & PLAN:   Assessment & Plan: Malignant neoplasm of upper-inner quadrant of left breast in female, estrogen receptor positive (HCC) -found on screening mammogram. 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 -she opted to proceed with b/l mastectomies with reconstruction on 01/07/22 with Dr. Vanderbilt and Dr. Arelia. Pathology revealed b/l breast cancers. Left breast showed 2.1 cm invasive and in situ ductal carcinoma. Margins and lymph nodes negative (0/2). Repeated ER/PR/HER2 were all negative (ER 10% weak (+) on biopsy) -she developed left IMF necrotic tissue, excised by Dr. Arelia on 01/27/22. -given prior treatment with adriamycin, she completed 4 cycles TC 02/12/22 - 06/06/22, tolerated fairly  -we reviewed that adjuvant radiation is not recommended due to her previous radiation for lymphoma. -She started adjuvant anastrozole  in July 2024, with significant hot flashes. -PET scan from December 2024 was benign with stable FDG avid thyroid  nodules. -Trial Veozah  45 mg daily to help with hot flashes.  She was unable to get this filled.  Co-pay was very expensive and medication not covered by her insurance. -Bilateral breast MRI to be scheduled in December 2025 for breast cancer surveillance. -Genetic screening and ctDNA Signatera done 04/2023 with negative results.  -bilateral breast MRI due 10/2024. Ordered as part of today's visit.  -Continue anastrozole  daily -Labs and follow up in 6 months, sooner if needed.    Left breast cancer  History of bilateral mastectomies with reconstruction.  Testing for ctDNA with Signatera negative in 04/2023. Due for bilateral breast MRI to evaluate for recurrence or evidence of metastatic disease. This will be ordered has part of today's visit.   Hot flashes Improved symptoms. Insurance still not covering treatment with veozah . Discussed topical peppermint oil to temporarily relieve acute hot flashes.   Plan Labs reviewed. -CBC and CMP are both unremarkable. Bilateral breast MRI to be ordered for today's visit. Continue anastrozole  daily. Labs and follow-up in 6 months, sooner if needed.  The patient understands the plans discussed today and is in agreement with them.  She knows to contact our office if she develops concerns prior to her next appointment.  I provided 25 minutes of face-to-face time during this encounter and > 50% was spent counseling as documented under my assessment and plan.    Powell FORBES Lessen, NP  [facility] - A DEPT OF [facility] [address] [place] [address] Dept: [phone] Dept Fax: [phone]   No orders of the defined types were placed in this encounter.     CHIEF COMPLAINT:  CC: Left breast cancer, ER +  Current Treatment: Anastrozole  1 mg daily  INTERVAL HISTORY:  Anandi is here today for repeat clinical assessment.  Her last visit was with me on 04/13/2024.  She denies new problems with the chest wall or nipples of either breast.  Continues to have hot flashes.  Insurance will still not cover Veozah .  She denies chest pain, chest pressure, or shortness of breath. She denies headaches or visual disturbances. She denies abdominal pain, nausea, vomiting, or changes in bowel or bladder habits.  She denies fevers or chills. She denies  pain. Her appetite is good. Her weight has been stable.  I have reviewed the past medical history, past surgical history, social history and family history with the patient and they are unchanged from  previous note.  ALLERGIES:  is allergic to amoxicillin, ibuprofen, ciprofloxacin , and codeine.  MEDICATIONS:  Current Outpatient Medications  Medication Sig Dispense Refill   albuterol  (VENTOLIN  HFA) 108 (90 Base) MCG/ACT inhaler Inhale 2 puffs into the lungs every 6 (six) hours as needed for wheezing or shortness of breath.     anastrozole  (ARIMIDEX ) 1 MG tablet Take 1 tablet by mouth daily. 30 tablet 3   BIOTIN PO Take 1 tablet by mouth daily.     FIBER PO Take 1 tablet by mouth daily.     FLUoxetine  (PROZAC ) 40 MG capsule Take 40 mg by mouth daily.     gabapentin  (NEURONTIN ) 100 MG capsule TAKE ONE CAPSULE BY MOUTH DAILY AT BEDTIME, CAN SLOWLY TITRATE UP TO ONE CAPSULE THREE TIMES DAILY IF TOLERATED 60 capsule 2   lisdexamfetamine (VYVANSE) 40 MG capsule Take 40 mg by mouth daily as needed (concentration).     Prenatal Vit-Fe Fumarate-FA (PRENATAL MULTIVITAMIN) TABS tablet Take 1 tablet by mouth daily at 12 noon.     rOPINIRole  (REQUIP ) 0.25 MG tablet Take 0.5 mg by mouth at bedtime.     TURMERIC PO Take 1 capsule by mouth daily.     Current Facility-Administered Medications  Medication Dose Route Frequency Provider Last Rate Last Admin   oxyCODONE -acetaminophen  (PERCOCET/ROXICET) 5-325 MG per tablet 1 tablet  1 tablet Oral Q6H PRN         HISTORY OF PRESENT ILLNESS:   Oncology History Overview Note   Cancer Staging  Invasive lobular carcinoma of right breast, stage 1 (HCC) Staging form: Breast, AJCC 8th Edition - Clinical stage from 01/07/2022: Stage Unknown (cT1c, cNX, cM0, G1, ER+, PR+, HER2-) - Signed by Lanny Callander, MD on 01/24/2022  Malignant neoplasm of upper-inner quadrant of left breast in female, estrogen receptor positive (HCC) Staging form: Breast, AJCC 8th Edition - Clinical stage from 11/20/2021: Stage IA (cT1c, cN0, cM0, G2, ER+, PR-, HER2-) - Signed by Lanny Callander, MD on 11/26/2021 - Pathologic stage from 01/07/2022: Stage IIA (pT2, pN0, cM0, G2, ER-, PR-, HER2-) - Signed  by Lanny Callander, MD on 01/24/2022     Malignant neoplasm of upper-inner quadrant of left breast in female, estrogen receptor positive (HCC)  11/05/2021 Mammogram   CLINICAL DATA:  Screening recall for a possible left breast asymmetry.   EXAM: DIGITAL DIAGNOSTIC UNILATERAL LEFT MAMMOGRAM WITH TOMOSYNTHESIS AND CAD; ULTRASOUND LEFT BREAST LIMITED  IMPRESSION: 1. Irregular 1.2 cm mass in the left breast at 11 o'clock, 2 cm the nipple, suspicious for malignancy.   11/20/2021 Cancer Staging   Staging form: Breast, AJCC 8th Edition - Clinical stage from 11/20/2021: Stage IA (cT1c, cN0, cM0, G2, ER+, PR-, HER2-) - Signed by Lanny Callander, MD on 11/26/2021 Stage prefix: Initial diagnosis Histologic grading system: 3 grade system   11/20/2021 Initial Biopsy   Diagnosis Breast, left, needle core biopsy, 11 o'clock, ribbon clip - INVASIVE DUCTAL CARCINOMA - DUCTAL CARCINOMA IN SITU - SEE COMMENT Microscopic Comment based on the biopsy, the carcinoma appears Nottingham grade 2 of 3 and measures 0.6 cm in greatest linear extent.  PROGNOSTIC INDICATORS Results: The tumor cells are NEGATIVE for Her2 (1+). Estrogen Receptor: 10%, POSITIVE, WEAK STAINING INTENSITY Progesterone Receptor: 0%, NEGATIVE Proliferation Marker Ki67: 20%   11/26/2021 Initial Diagnosis   Malignant neoplasm  of upper-inner quadrant of left breast in female, estrogen receptor positive (HCC)   11/27/2021 Genetic Testing   [facility] was Negative. Report date is 12/11/2021.  The CancerNext gene panel offered by [employer] includes sequencing, rearrangement analysis, and RNA analysis for the following 36 genes:   APC, ATM, AXIN2, BARD1, BMPR1A, BRCA1, BRCA2, BRIP1, CDH1, CDK4, CDKN2A, CHEK2, DICER1, HOXB13, EPCAM, GREM1, MLH1, MSH2, MSH3, MSH6, MUTYH, NBN, NF1, NTHL1, PALB2, PMS2, POLD1, POLE, PTEN, RAD51C, RAD51D, RECQL, SMAD4, SMARCA4, STK11, and TP53.    01/07/2022 Cancer Staging   Staging form: Breast, AJCC 8th  Edition - Pathologic stage from 01/07/2022: Stage IIA (pT2, pN0, cM0, G2, ER-, PR-, HER2-) - Signed by Lanny Callander, MD on 01/24/2022 Stage prefix: Initial diagnosis Histologic grading system: 3 grade system Residual tumor (R): R0 - None   01/07/2022 Definitive Surgery   FINAL MICROSCOPIC DIAGNOSIS:   A. BREAST, RIGHT NIPPLE, BIOPSY:  - Benign breast tissue.  - No malignancy identified.   B. BREAST, LEFT NIPPLE, BIOPSY:  - Benign breast tissue.  - No malignancy identified.   C. BREAST, RIGHT, MASTECTOMY:  - Invasive lobular carcinoma, 1.1 cm.  - Ductal carcinoma in situ, 1 cm.  - Invasive carcinoma focally extends to the anterior margin.  - Fibrocystic changes with usual ductal hyperplasia and calcifications.  - Sclerosing adenosis and fibroadenomatoid change.  - See oncology table and comment.   D. LYMPH NODE, LEFT AXILLARY, SENTINEL, EXCISION:  - One lymph node negative for metastatic carcinoma (0/1).   E. LYMPH NODE, LEFT AXILLARY, SENTINEL, EXCISION:  - One lymph node negative for metastatic carcinoma (0/1).   F. BREAST, LEFT, MASTECTOMY:  - Invasive and in situ ductal carcinoma, 2.1 cm.  - Margins negative for carcinoma.  - See oncology table.    01/07/2022 Receptors her2   F1. PROGNOSTIC INDICATOR RESULTS:   The tumor cells are NEGATIVE for Her2 (0).   Estrogen Receptor: NEGATIVE  Progesterone Receptor: NEGATIVE  Proliferation Marker Ki-67: 10%    02/12/2022 - 06/06/2022 Chemotherapy   Patient is on Treatment Plan : BREAST TC q21d     08/2022 -  Anti-estrogen oral therapy   Tamoxifen    11/01/2023 PET scan   IMPRESSION: Status post bilateral mastectomy with reconstruction.  No findings suspicious for recurrent or metastatic disease.  Small bilateral pulmonary nodules, unchanged, likely benign.  7 mm left thyroid  nodule, FDG avid. Recommend thyroid  US  and biopsy   Invasive lobular carcinoma of right breast, stage 1 (HCC)  11/27/2021 Genetic Testing   [facility] was Negative. Report date is 12/11/2021.  The CancerNext gene panel offered by [employer] includes sequencing, rearrangement analysis, and RNA analysis for the following 36 genes:   APC, ATM, AXIN2, BARD1, BMPR1A, BRCA1, BRCA2, BRIP1, CDH1, CDK4, CDKN2A, CHEK2, DICER1, HOXB13, EPCAM, GREM1, MLH1, MSH2, MSH3, MSH6, MUTYH, NBN, NF1, NTHL1, PALB2, PMS2, POLD1, POLE, PTEN, RAD51C, RAD51D, RECQL, SMAD4, SMARCA4, STK11, and TP53.    01/07/2022 Cancer Staging   Staging form: Breast, AJCC 8th Edition - Clinical stage from 01/07/2022: Stage Unknown (cT1c, cNX, cM0, G1, ER+, PR+, HER2-) - Signed by Lanny Callander, MD on 01/24/2022 Stage prefix: Initial diagnosis Histologic grading system: 3 grade system   01/07/2022 Definitive Surgery   FINAL MICROSCOPIC DIAGNOSIS:   A. BREAST, RIGHT NIPPLE, BIOPSY:  - Benign breast tissue.  - No malignancy identified.   B. BREAST, LEFT NIPPLE, BIOPSY:  - Benign breast tissue.  - No malignancy identified.   C. BREAST, RIGHT, MASTECTOMY:  - Invasive  lobular carcinoma, 1.1 cm.  - Ductal carcinoma in situ, 1 cm.  - Invasive carcinoma focally extends to the anterior margin.  - Fibrocystic changes with usual ductal hyperplasia and calcifications.  - Sclerosing adenosis and fibroadenomatoid change.  - See oncology table and comment.   D. LYMPH NODE, LEFT AXILLARY, SENTINEL, EXCISION:  - One lymph node negative for metastatic carcinoma (0/1).   E. LYMPH NODE, LEFT AXILLARY, SENTINEL, EXCISION:  - One lymph node negative for metastatic carcinoma (0/1).   F. BREAST, LEFT, MASTECTOMY:  - Invasive and in situ ductal carcinoma, 2.1 cm.  - Margins negative for carcinoma.  - See oncology table.    01/07/2022 Receptors her2   C7.  PROGNOSTIC INDICATOR RESULTS:   The tumor cells are NEGATIVE for Her2 (1+).   Estrogen Receptor: POSITIVE, 30% WEAK STAINING INTENSITY  Progesterone Receptor: POSITIVE, 80% MODERATE STAINING INTENSITY  Proliferation Marker Ki-67:  <5%    01/24/2022 Initial Diagnosis   Invasive lobular carcinoma of right breast, stage 1 (HCC)   08/2022 -  Anti-estrogen oral therapy   Tamoxifen        REVIEW OF SYSTEMS:   Constitutional: Denies fevers, chills or abnormal weight loss Eyes: Denies blurriness of vision Ears, nose, mouth, throat, and face: Denies mucositis or sore throat Respiratory: Denies cough, dyspnea or wheezes Cardiovascular: Denies palpitation, chest discomfort or lower extremity swelling Gastrointestinal:  Denies nausea, heartburn or change in bowel habits Skin: Denies abnormal skin rashes Lymphatics: Denies new lymphadenopathy or easy bruising Neurological:Denies numbness, tingling or new weaknesses Behavioral/Psych: Mood is stable, no new changes  All other systems were reviewed with the patient and are negative.   VITALS:   Today's Vitals   07/13/24 1334  BP: 138/76  Pulse: 99  Resp: 17  Temp: 97.8 F (36.6 C)  SpO2: 96%  Weight: 169 lb 11.2 oz (77 kg)   Body mass index is 30.06 kg/m.  Wt Readings from Last 3 Encounters:  07/13/24 169 lb 11.2 oz (77 kg)  04/18/24 170 lb 6 oz (77.3 kg)  04/13/24 173 lb 3.2 oz (78.6 kg)    Body mass index is 30.06 kg/m.  Performance status (ECOG): 1 - Symptomatic but completely ambulatory  PHYSICAL EXAM:   GENERAL:alert, no distress and comfortable SKIN: skin color, texture, turgor are normal, no rashes or significant lesions EYES: normal, Conjunctiva are pink and non-injected, sclera clear OROPHARYNX:no exudate, no erythema and lips, buccal mucosa, and tongue normal  NECK: supple, thyroid  normal size, non-tender, without nodularity LYMPH:  no palpable lymphadenopathy in the cervical, axillary or inguinal LUNGS: clear to auscultation and percussion with normal breathing effort HEART: regular rate & rhythm and no murmurs and no lower extremity edema ABDOMEN:abdomen soft, non-tender and normal bowel sounds Musculoskeletal:no cyanosis of digits and no  clubbing  NEURO: alert & oriented x 3 with fluent speech, no focal motor/sensory deficits BREAST: Bilateral mastectomy with reconstruction.  Right breast is without palpable masses or lumps around the chest wall.  There is no nipple inversion or nipple discharge.  There is no axillary lymphadenopathy on the right.  The left breast is without palpable lumps or masses around the chest wall.  There is no nipple inversion or nipple discharge.  There is no axillary lymphadenopathy on the left.  LABORATORY DATA:  I have reviewed the data as listed    Component Value Date/Time   NA 137 07/13/2024 1313   K 4.4 07/13/2024 1313   CL 101 07/13/2024 1313   CO2 30 07/13/2024 1313  GLUCOSE 96 07/13/2024 1313   BUN 13 07/13/2024 1313   CREATININE 0.83 07/13/2024 1313   CALCIUM  9.7 07/13/2024 1313   PROT 7.2 07/13/2024 1313   ALBUMIN 4.2 07/13/2024 1313   AST 20 07/13/2024 1313   ALT 18 07/13/2024 1313   ALKPHOS 66 07/13/2024 1313   BILITOT 0.3 07/13/2024 1313   GFRNONAA >60 07/13/2024 1313      Lab Results  Component Value Date   WBC 6.0 07/13/2024   NEUTROABS 3.8 07/13/2024   HGB 12.4 07/13/2024   HCT 38.3 07/13/2024   MCV 93.0 07/13/2024   PLT 300 07/13/2024

## 2024-07-13 ENCOUNTER — Inpatient Hospital Stay: Admitting: Nurse Practitioner

## 2024-07-13 ENCOUNTER — Inpatient Hospital Stay: Attending: Hematology

## 2024-07-13 VITALS — BP 138/76 | HR 99 | Temp 97.8°F | Resp 17 | Wt 169.7 lb

## 2024-07-13 DIAGNOSIS — Z9013 Acquired absence of bilateral breasts and nipples: Secondary | ICD-10-CM | POA: Diagnosis not present

## 2024-07-13 DIAGNOSIS — Z170 Estrogen receptor positive status [ER+]: Secondary | ICD-10-CM

## 2024-07-13 DIAGNOSIS — C50212 Malignant neoplasm of upper-inner quadrant of left female breast: Secondary | ICD-10-CM | POA: Diagnosis present

## 2024-07-13 DIAGNOSIS — Z79811 Long term (current) use of aromatase inhibitors: Secondary | ICD-10-CM | POA: Diagnosis not present

## 2024-07-13 DIAGNOSIS — Z8572 Personal history of non-Hodgkin lymphomas: Secondary | ICD-10-CM | POA: Insufficient documentation

## 2024-07-13 LAB — CBC WITH DIFFERENTIAL (CANCER CENTER ONLY)
Abs Immature Granulocytes: 0 K/uL (ref 0.00–0.07)
Basophils Absolute: 0.1 K/uL (ref 0.0–0.1)
Basophils Relative: 2 %
Eosinophils Absolute: 0.2 K/uL (ref 0.0–0.5)
Eosinophils Relative: 3 %
HCT: 38.3 % (ref 36.0–46.0)
Hemoglobin: 12.4 g/dL (ref 12.0–15.0)
Immature Granulocytes: 0 %
Lymphocytes Relative: 20 %
Lymphs Abs: 1.2 K/uL (ref 0.7–4.0)
MCH: 30.1 pg (ref 26.0–34.0)
MCHC: 32.4 g/dL (ref 30.0–36.0)
MCV: 93 fL (ref 80.0–100.0)
Monocytes Absolute: 0.7 K/uL (ref 0.1–1.0)
Monocytes Relative: 11 %
Neutro Abs: 3.8 K/uL (ref 1.7–7.7)
Neutrophils Relative %: 64 %
Platelet Count: 300 K/uL (ref 150–400)
RBC: 4.12 MIL/uL (ref 3.87–5.11)
RDW: 13.2 % (ref 11.5–15.5)
WBC Count: 6 K/uL (ref 4.0–10.5)
nRBC: 0 % (ref 0.0–0.2)

## 2024-07-13 LAB — CMP (CANCER CENTER ONLY)
ALT: 18 U/L (ref 0–44)
AST: 20 U/L (ref 15–41)
Albumin: 4.2 g/dL (ref 3.5–5.0)
Alkaline Phosphatase: 66 U/L (ref 38–126)
Anion gap: 6 (ref 5–15)
BUN: 13 mg/dL (ref 6–20)
CO2: 30 mmol/L (ref 22–32)
Calcium: 9.7 mg/dL (ref 8.9–10.3)
Chloride: 101 mmol/L (ref 98–111)
Creatinine: 0.83 mg/dL (ref 0.44–1.00)
GFR, Estimated: >60 mL/min (ref >60)
Glucose, Bld: 96 mg/dL (ref 70–99)
Potassium: 4.4 mmol/L (ref 3.5–5.1)
Sodium: 137 mmol/L (ref 135–145)
Total Bilirubin: 0.3 mg/dL (ref 0.0–1.2)
Total Protein: 7.2 g/dL (ref 6.5–8.1)

## 2024-07-13 NOTE — Assessment & Plan Note (Addendum)
-  found on screening mammogram. 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 -she opted to proceed with b/l mastectomies with reconstruction on 01/07/22 with Dr. Vanderbilt and Dr. Arelia. Pathology revealed b/l breast cancers. Left breast showed 2.1 cm invasive and in situ ductal carcinoma. Margins and lymph nodes negative (0/2). Repeated ER/PR/HER2 were all negative (ER 10% weak (+) on biopsy) -she developed left IMF necrotic tissue, excised by Dr. Arelia on 01/27/22. -given prior treatment with adriamycin, she completed 4 cycles TC 02/12/22 - 06/06/22, tolerated fairly  -we reviewed that adjuvant radiation is not recommended due to her previous radiation for lymphoma. -She started adjuvant anastrozole  in July 2024, with significant hot flashes. -PET scan from December 2024 was benign with stable FDG avid thyroid  nodules. -Trial Veozah  45 mg daily to help with hot flashes.  She was unable to get this filled.  Co-pay was very expensive and medication not covered by her insurance. -Bilateral breast MRI to be scheduled in December 2025 for breast cancer surveillance. -Genetic screening and ctDNA Signatera done 04/2023 with negative results.  -bilateral breast MRI due 10/2024. Ordered as part of today's visit.  -Continue anastrozole  daily -Labs and follow up in 6 months, sooner if needed.

## 2024-07-17 ENCOUNTER — Encounter: Payer: Self-pay | Admitting: Nurse Practitioner

## 2024-07-17 NOTE — Addendum Note (Signed)
 Addended by: HANFORD MOATS on: 07/17/2024 06:12 PM   Modules accepted: Orders

## 2024-07-30 ENCOUNTER — Ambulatory Visit (HOSPITAL_BASED_OUTPATIENT_CLINIC_OR_DEPARTMENT_OTHER)

## 2024-08-06 ENCOUNTER — Ambulatory Visit (HOSPITAL_BASED_OUTPATIENT_CLINIC_OR_DEPARTMENT_OTHER)
Admission: RE | Admit: 2024-08-06 | Discharge: 2024-08-06 | Disposition: A | Source: Ambulatory Visit | Attending: Primary Care

## 2024-08-06 DIAGNOSIS — R918 Other nonspecific abnormal finding of lung field: Secondary | ICD-10-CM | POA: Diagnosis present

## 2024-08-11 ENCOUNTER — Ambulatory Visit: Payer: Self-pay | Admitting: Primary Care

## 2024-08-31 ENCOUNTER — Encounter: Payer: Self-pay | Admitting: Nurse Practitioner

## 2024-10-14 ENCOUNTER — Telehealth: Payer: Self-pay | Admitting: Nurse Practitioner

## 2024-10-14 NOTE — Telephone Encounter (Signed)
 Attempted to contact patient to discuss previously scheduled breast MRI. This was denied by her insurance and study cancelled. Left call back number to my direct extension [phone].  -Powell Lessen, NP

## 2024-10-17 ENCOUNTER — Ambulatory Visit

## 2024-10-19 ENCOUNTER — Ambulatory Visit (HOSPITAL_COMMUNITY)

## 2024-10-24 ENCOUNTER — Other Ambulatory Visit: Payer: Self-pay | Admitting: Nurse Practitioner

## 2024-10-24 DIAGNOSIS — C50212 Malignant neoplasm of upper-inner quadrant of left female breast: Secondary | ICD-10-CM

## 2024-10-24 NOTE — Progress Notes (Signed)
 " Patient Care Team: Pia Kerney SQUIBB, MD as PCP - General (Internal Medicine) Vanderbilt Ned, MD as Consulting Physician (General Surgery) Lanny Callander, MD as Consulting Physician (Hematology) Shannon Agent, MD as Consulting Physician (Radiation Oncology) [place] GAILS, DO as Consulting Physician (Gastroenterology)  Clinic Day:  10/27/2024  Referring physician: Pia Kerney SQUIBB, MD  ASSESSMENT & PLAN:   Assessment & Plan: Malignant neoplasm of upper-inner quadrant of left breast in female, estrogen receptor positive (HCC) -found on screening mammogram. 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 -she opted to proceed with b/l mastectomies with reconstruction on 01/07/22 with Dr. Vanderbilt and Dr. Arelia. Pathology revealed b/l breast cancers. Left breast showed 2.1 cm invasive and in situ ductal carcinoma. Margins and lymph nodes negative (0/2). Repeated ER/PR/HER2 were all negative (ER 10% weak (+) on biopsy) -she developed left IMF necrotic tissue, excised by Dr. Arelia on 01/27/22. -given prior treatment with adriamycin, she completed 4 cycles TC 02/12/22 - 06/06/22, tolerated fairly well.  -we reviewed that adjuvant radiation is not recommended due to her previous radiation for lymphoma. -She started adjuvant anastrozole  in July 2024, with significant hot flashes. -PET scan from December 2024 was benign with stable FDG avid thyroid  nodules. -Genetic screening and ctDNA Signatera done 04/2023 with negative results.  -Continue anastrozole  daily -Labs and follow up in 6 months, sooner if needed.    Plan: Labs reviewed. -unremarkable CBC and CMP.  -signatera test drawn today. Will contact her with results when they  are available.  DEXA scan scheduled for 01/2025.  Continue anastrozole  daily.  Labs and follow up in 6 months and sooner if needed.   The patient understands the plans discussed today and is in agreement with them.  She knows to contact our office if she develops  concerns prior to her next appointment.  I provided 25 minutes of face-to-face time during this encounter and > 50% was spent counseling as documented under my assessment and plan.    Erika FORBES Lessen, NP  [facility] - A DEPT OF [facility] [address] [place] [address] Dept: [phone] Dept Fax: [phone]   No orders of the defined types were placed in this encounter.     CHIEF COMPLAINT:  CC: Left breast cancer, ER +  Current Treatment: Anastrozole  1 mg daily  INTERVAL HISTORY:  Erika Hodge is here today for repeat clinical assessment.  She last saw me on 07/13/2024.  She is scheduled for DEXA scan on 01/09/2025. She continues to tolerate  anastrozole  well with manageable hot flashes and night sweats. She denies new lumps or masses along the chest wall of either breast. She denies other  new problems or concerns. She denies chest pain, chest pressure, or shortness of breath. She denies headaches or visual disturbances. She denies abdominal pain, nausea, vomiting, or changes in bowel or bladder habits.   She denies fevers or chills. She denies pain. Her appetite is good. She does have increased fatigue.  Her weight has been stable.  I have reviewed the past medical history, past surgical history, social history and family history with the patient and they are unchanged from previous note.  ALLERGIES:  is allergic to amoxicillin, ibuprofen, ciprofloxacin , and codeine.  MEDICATIONS:  Current Outpatient Medications  Medication Sig Dispense Refill   albuterol  (VENTOLIN  HFA) 108 (90 Base) MCG/ACT inhaler Inhale 2 puffs into the lungs every 6 (six) hours as needed for wheezing or  shortness of breath.     anastrozole  (ARIMIDEX ) 1 MG tablet Take 1 tablet by mouth daily. 30 tablet 3   BIOTIN PO Take 1 tablet by mouth daily.     FIBER PO Take 1 tablet by mouth daily.     FLUoxetine  (PROZAC ) 40 MG capsule Take 40 mg by  mouth daily.     gabapentin  (NEURONTIN ) 100 MG capsule TAKE ONE CAPSULE BY MOUTH DAILY AT BEDTIME, CAN SLOWLY TITRATE UP TO ONE CAPSULE THREE TIMES DAILY IF TOLERATED 60 capsule 2   lisdexamfetamine (VYVANSE) 40 MG capsule Take 40 mg by mouth daily as needed (concentration).     Prenatal Vit-Fe Fumarate-FA (PRENATAL MULTIVITAMIN) TABS tablet Take 1 tablet by mouth daily at 12 noon.     rOPINIRole  (REQUIP ) 0.25 MG tablet Take 0.5 mg by mouth at bedtime.     TURMERIC PO Take 1 capsule by mouth daily.     Current Facility-Administered Medications  Medication Dose Route Frequency Provider Last Rate Last Admin   oxyCODONE -acetaminophen  (PERCOCET/ROXICET) 5-325 MG per tablet 1 tablet  1 tablet Oral Q6H PRN         HISTORY OF PRESENT ILLNESS:   Oncology History Overview Note   Cancer Staging  Invasive lobular carcinoma of right breast, stage 1 (HCC) Staging form: Breast, AJCC 8th Edition - Clinical stage from 01/07/2022: Stage Unknown (cT1c, cNX, cM0, G1, ER+, PR+, HER2-) - Signed by Lanny Callander, MD on 01/24/2022  Malignant neoplasm of upper-inner quadrant of left breast in female, estrogen receptor positive (HCC) Staging form: Breast, AJCC 8th Edition - Clinical stage from 11/20/2021: Stage IA (cT1c, cN0, cM0, G2, ER+, PR-, HER2-) - Signed by Lanny Callander, MD on 11/26/2021 - Pathologic stage from 01/07/2022: Stage IIA (pT2, pN0, cM0, G2, ER-, PR-, HER2-) - Signed by Lanny Callander, MD on 01/24/2022     Malignant neoplasm of upper-inner quadrant of left breast in female, estrogen receptor positive (HCC)  11/05/2021 Mammogram   CLINICAL DATA:  Screening recall for a possible left breast asymmetry.   EXAM: DIGITAL DIAGNOSTIC UNILATERAL LEFT MAMMOGRAM WITH TOMOSYNTHESIS AND CAD; ULTRASOUND LEFT BREAST LIMITED  IMPRESSION: 1. Irregular 1.2 cm mass in the left breast at 11 o'clock, 2 cm the nipple, suspicious for malignancy.   11/20/2021 Cancer Staging   Staging form: Breast, AJCC 8th Edition - Clinical  stage from 11/20/2021: Stage IA (cT1c, cN0, cM0, G2, ER+, PR-, HER2-) - Signed by Lanny Callander, MD on 11/26/2021 Stage prefix: Initial diagnosis Histologic grading system: 3 grade system   11/20/2021 Initial Biopsy   Diagnosis Breast, left, needle core biopsy, 11 o'clock, ribbon clip - INVASIVE DUCTAL CARCINOMA - DUCTAL CARCINOMA IN SITU - SEE COMMENT Microscopic Comment based on the biopsy, the carcinoma appears Nottingham grade 2 of 3 and measures 0.6 cm in greatest linear extent.  PROGNOSTIC INDICATORS Results: The tumor cells are NEGATIVE for Her2 (1+). Estrogen Receptor: 10%, POSITIVE, WEAK STAINING INTENSITY Progesterone Receptor: 0%, NEGATIVE Proliferation Marker Ki67: 20%   11/26/2021 Initial Diagnosis   Malignant neoplasm of upper-inner quadrant of left breast in female, estrogen receptor positive (HCC)   11/27/2021 Genetic Testing   [facility] was Negative. Report date is 12/11/2021.  The CancerNext gene panel offered by [employer] includes sequencing, rearrangement analysis, and RNA analysis for the following 36 genes:   APC, ATM, AXIN2, BARD1, BMPR1A, BRCA1, BRCA2, BRIP1, CDH1, CDK4, CDKN2A, CHEK2, DICER1, HOXB13, EPCAM, GREM1, MLH1, MSH2, MSH3, MSH6, MUTYH, NBN, NF1, NTHL1, PALB2, PMS2, POLD1, POLE, PTEN, RAD51C, RAD51D, RECQL, SMAD4,  SMARCA4, STK11, and TP53.    01/07/2022 Cancer Staging   Staging form: Breast, AJCC 8th Edition - Pathologic stage from 01/07/2022: Stage IIA (pT2, pN0, cM0, G2, ER-, PR-, HER2-) - Signed by Lanny Callander, MD on 01/24/2022 Stage prefix: Initial diagnosis Histologic grading system: 3 grade system Residual tumor (R): R0 - None   01/07/2022 Definitive Surgery   FINAL MICROSCOPIC DIAGNOSIS:   A. BREAST, RIGHT NIPPLE, BIOPSY:  - Benign breast tissue.  - No malignancy identified.   B. BREAST, LEFT NIPPLE, BIOPSY:  - Benign breast tissue.  - No malignancy identified.   C. BREAST, RIGHT, MASTECTOMY:  - Invasive lobular carcinoma, 1.1 cm.  -  Ductal carcinoma in situ, 1 cm.  - Invasive carcinoma focally extends to the anterior margin.  - Fibrocystic changes with usual ductal hyperplasia and calcifications.  - Sclerosing adenosis and fibroadenomatoid change.  - See oncology table and comment.   D. LYMPH NODE, LEFT AXILLARY, SENTINEL, EXCISION:  - One lymph node negative for metastatic carcinoma (0/1).   E. LYMPH NODE, LEFT AXILLARY, SENTINEL, EXCISION:  - One lymph node negative for metastatic carcinoma (0/1).   F. BREAST, LEFT, MASTECTOMY:  - Invasive and in situ ductal carcinoma, 2.1 cm.  - Margins negative for carcinoma.  - See oncology table.    01/07/2022 Receptors her2   F1. PROGNOSTIC INDICATOR RESULTS:   The tumor cells are NEGATIVE for Her2 (0).   Estrogen Receptor: NEGATIVE  Progesterone Receptor: NEGATIVE  Proliferation Marker Ki-67: 10%    02/12/2022 - 06/06/2022 Chemotherapy   Patient is on Treatment Plan : BREAST TC q21d     08/2022 -  Anti-estrogen oral therapy   Tamoxifen    11/01/2023 PET scan   IMPRESSION: Status post bilateral mastectomy with reconstruction.  No findings suspicious for recurrent or metastatic disease.  Small bilateral pulmonary nodules, unchanged, likely benign.  7 mm left thyroid  nodule, FDG avid. Recommend thyroid  US  and biopsy   Invasive lobular carcinoma of right breast, stage 1 (HCC)  11/27/2021 Genetic Testing   [facility] was Negative. Report date is 12/11/2021.  The CancerNext gene panel offered by [employer] includes sequencing, rearrangement analysis, and RNA analysis for the following 36 genes:   APC, ATM, AXIN2, BARD1, BMPR1A, BRCA1, BRCA2, BRIP1, CDH1, CDK4, CDKN2A, CHEK2, DICER1, HOXB13, EPCAM, GREM1, MLH1, MSH2, MSH3, MSH6, MUTYH, NBN, NF1, NTHL1, PALB2, PMS2, POLD1, POLE, PTEN, RAD51C, RAD51D, RECQL, SMAD4, SMARCA4, STK11, and TP53.    01/07/2022 Cancer Staging   Staging form: Breast, AJCC 8th Edition - Clinical stage from 01/07/2022: Stage Unknown (cT1c,  cNX, cM0, G1, ER+, PR+, HER2-) - Signed by Lanny Callander, MD on 01/24/2022 Stage prefix: Initial diagnosis Histologic grading system: 3 grade system   01/07/2022 Definitive Surgery   FINAL MICROSCOPIC DIAGNOSIS:   A. BREAST, RIGHT NIPPLE, BIOPSY:  - Benign breast tissue.  - No malignancy identified.   B. BREAST, LEFT NIPPLE, BIOPSY:  - Benign breast tissue.  - No malignancy identified.   C. BREAST, RIGHT, MASTECTOMY:  - Invasive lobular carcinoma, 1.1 cm.  - Ductal carcinoma in situ, 1 cm.  - Invasive carcinoma focally extends to the anterior margin.  - Fibrocystic changes with usual ductal hyperplasia and calcifications.  - Sclerosing adenosis and fibroadenomatoid change.  - See oncology table and comment.   D. LYMPH NODE, LEFT AXILLARY, SENTINEL, EXCISION:  - One lymph node negative for metastatic carcinoma (0/1).   E. LYMPH NODE, LEFT AXILLARY, SENTINEL, EXCISION:  - One lymph node negative for metastatic carcinoma (  0/1).   F. BREAST, LEFT, MASTECTOMY:  - Invasive and in situ ductal carcinoma, 2.1 cm.  - Margins negative for carcinoma.  - See oncology table.    01/07/2022 Receptors her2   C7.  PROGNOSTIC INDICATOR RESULTS:   The tumor cells are NEGATIVE for Her2 (1+).   Estrogen Receptor: POSITIVE, 30% WEAK STAINING INTENSITY  Progesterone Receptor: POSITIVE, 80% MODERATE STAINING INTENSITY  Proliferation Marker Ki-67: <5%    01/24/2022 Initial Diagnosis   Invasive lobular carcinoma of right breast, stage 1 (HCC)   08/2022 -  Anti-estrogen oral therapy   Tamoxifen        REVIEW OF SYSTEMS:   Constitutional: Denies fevers, chills or abnormal weight loss Eyes: Denies blurriness of vision Ears, nose, mouth, throat, and face: Denies mucositis or sore throat Respiratory: Denies cough, dyspnea or wheezes Cardiovascular: Denies palpitation, chest discomfort or lower extremity swelling Gastrointestinal:  Denies nausea, heartburn or change in bowel habits Skin: Denies  abnormal skin rashes Lymphatics: Denies new lymphadenopathy or easy bruising Neurological:Denies numbness, tingling or new weaknesses Behavioral/Psych: Mood is stable, no new changes  All other systems were reviewed with the patient and are negative.   VITALS:   Today's Vitals   10/27/24 1302 10/27/24 1308  BP: 122/60   Pulse: 79   Resp: 17   Temp: 97.8 F (36.6 C)   SpO2: 99%   Weight: 169 lb 9.6 oz (76.9 kg)   PainSc:  0-No pain   Body mass index is 30.04 kg/m.   Wt Readings from Last 3 Encounters:  10/27/24 169 lb 9.6 oz (76.9 kg)  07/13/24 169 lb 11.2 oz (77 kg)  04/18/24 170 lb 6 oz (77.3 kg)    Body mass index is 30.04 kg/m.  Performance status (ECOG): 1 - Symptomatic but completely ambulatory  PHYSICAL EXAM:   GENERAL:alert, no distress and comfortable SKIN: skin color, texture, turgor are normal, no rashes or significant lesions EYES: normal, Conjunctiva are pink and non-injected, sclera clear OROPHARYNX:no exudate, no erythema and lips, buccal mucosa, and tongue normal  NECK: supple, thyroid  normal size, non-tender, without nodularity LYMPH:  no palpable lymphadenopathy in the cervical, axillary or inguinal LUNGS: clear to auscultation and percussion with normal breathing effort HEART: regular rate & rhythm and no murmurs and no lower extremity edema ABDOMEN:abdomen soft, non-tender and normal bowel sounds Musculoskeletal:no cyanosis of digits and no clubbing  NEURO: alert & oriented x 3 with fluent speech, no focal motor/sensory deficits BREAST: Bilateral mastectomy with reconstruction. Right breast is without palpable masses or lumps around the chest wall. There is no nipple inversion or nipple discharge. There is no axillary lymphadenopathy on the right. The left breast is without palpable lumps or masses around the chest wall. There is no nipple inversion or nipple discharge. There is no axillary lymphadenopathy on the left.   LABORATORY DATA:  I have  reviewed the data as listed    Component Value Date/Time   NA 138 10/27/2024 1252   K 4.3 10/27/2024 1252   CL 102 10/27/2024 1252   CO2 27 10/27/2024 1252   GLUCOSE 90 10/27/2024 1252   BUN 16 10/27/2024 1252   CREATININE 0.89 10/27/2024 1252   CALCIUM  9.9 10/27/2024 1252   PROT 7.6 10/27/2024 1252   ALBUMIN 4.7 10/27/2024 1252   AST 25 10/27/2024 1252   ALT 21 10/27/2024 1252   ALKPHOS 85 10/27/2024 1252   BILITOT 0.3 10/27/2024 1252   GFRNONAA >60 10/27/2024 1252     Lab Results  Component  Value Date   WBC 4.9 10/27/2024   NEUTROABS 2.6 10/27/2024   HGB 13.1 10/27/2024   HCT 40.2 10/27/2024   MCV 94.1 10/27/2024   PLT 350 10/27/2024    "

## 2024-10-26 ENCOUNTER — Ambulatory Visit (HOSPITAL_COMMUNITY): Admission: RE | Admit: 2024-10-26 | Source: Ambulatory Visit

## 2024-10-27 ENCOUNTER — Inpatient Hospital Stay (HOSPITAL_BASED_OUTPATIENT_CLINIC_OR_DEPARTMENT_OTHER): Admitting: Nurse Practitioner

## 2024-10-27 ENCOUNTER — Inpatient Hospital Stay: Attending: Nurse Practitioner

## 2024-10-27 VITALS — BP 122/60 | HR 79 | Temp 97.8°F | Resp 17 | Wt 169.6 lb

## 2024-10-27 DIAGNOSIS — C50211 Malignant neoplasm of upper-inner quadrant of right female breast: Secondary | ICD-10-CM | POA: Diagnosis present

## 2024-10-27 DIAGNOSIS — C50212 Malignant neoplasm of upper-inner quadrant of left female breast: Secondary | ICD-10-CM

## 2024-10-27 DIAGNOSIS — Z79811 Long term (current) use of aromatase inhibitors: Secondary | ICD-10-CM | POA: Insufficient documentation

## 2024-10-27 DIAGNOSIS — Z170 Estrogen receptor positive status [ER+]: Secondary | ICD-10-CM | POA: Diagnosis not present

## 2024-10-27 DIAGNOSIS — C50919 Malignant neoplasm of unspecified site of unspecified female breast: Secondary | ICD-10-CM

## 2024-10-27 LAB — CBC WITH DIFFERENTIAL (CANCER CENTER ONLY)
Abs Immature Granulocytes: 0.01 K/uL (ref 0.00–0.07)
Basophils Absolute: 0.1 K/uL (ref 0.0–0.1)
Basophils Relative: 2 %
Eosinophils Absolute: 0.3 K/uL (ref 0.0–0.5)
Eosinophils Relative: 6 %
HCT: 40.2 % (ref 36.0–46.0)
Hemoglobin: 13.1 g/dL (ref 12.0–15.0)
Immature Granulocytes: 0 %
Lymphocytes Relative: 26 %
Lymphs Abs: 1.2 K/uL (ref 0.7–4.0)
MCH: 30.7 pg (ref 26.0–34.0)
MCHC: 32.6 g/dL (ref 30.0–36.0)
MCV: 94.1 fL (ref 80.0–100.0)
Monocytes Absolute: 0.6 K/uL (ref 0.1–1.0)
Monocytes Relative: 13 %
Neutro Abs: 2.6 K/uL (ref 1.7–7.7)
Neutrophils Relative %: 53 %
Platelet Count: 350 K/uL (ref 150–400)
RBC: 4.27 MIL/uL (ref 3.87–5.11)
RDW: 13.2 % (ref 11.5–15.5)
WBC Count: 4.9 K/uL (ref 4.0–10.5)
nRBC: 0 % (ref 0.0–0.2)

## 2024-10-27 LAB — CMP (CANCER CENTER ONLY)
ALT: 21 U/L (ref 0–44)
AST: 25 U/L (ref 15–41)
Albumin: 4.7 g/dL (ref 3.5–5.0)
Alkaline Phosphatase: 85 U/L (ref 38–126)
Anion gap: 10 (ref 5–15)
BUN: 16 mg/dL (ref 6–20)
CO2: 27 mmol/L (ref 22–32)
Calcium: 9.9 mg/dL (ref 8.9–10.3)
Chloride: 102 mmol/L (ref 98–111)
Creatinine: 0.89 mg/dL (ref 0.44–1.00)
GFR, Estimated: >60 mL/min (ref >60)
Glucose, Bld: 90 mg/dL (ref 70–99)
Potassium: 4.3 mmol/L (ref 3.5–5.1)
Sodium: 138 mmol/L (ref 135–145)
Total Bilirubin: 0.3 mg/dL (ref 0.0–1.2)
Total Protein: 7.6 g/dL (ref 6.5–8.1)

## 2024-10-27 LAB — GENETIC SCREENING ORDER

## 2024-10-27 NOTE — Assessment & Plan Note (Addendum)
-  found on screening mammogram. 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 -she opted to proceed with b/l mastectomies with reconstruction on 01/07/22 with Dr. Vanderbilt and Dr. Arelia. Pathology revealed b/l breast cancers. Left breast showed 2.1 cm invasive and in situ ductal carcinoma. Margins and lymph nodes negative (0/2). Repeated ER/PR/HER2 were all negative (ER 10% weak (+) on biopsy) -she developed left IMF necrotic tissue, excised by Dr. Arelia on 01/27/22. -given prior treatment with adriamycin, she completed 4 cycles TC 02/12/22 - 06/06/22, tolerated fairly well.  -we reviewed that adjuvant radiation is not recommended due to her previous radiation for lymphoma. -She started adjuvant anastrozole  in July 2024, with significant hot flashes. -PET scan from December 2024 was benign with stable FDG avid thyroid  nodules. -Genetic screening and ctDNA Signatera done 04/2023 with negative results.  -Continue anastrozole  daily -Labs and follow up in 6 months, sooner if needed.

## 2024-11-06 ENCOUNTER — Encounter: Payer: Self-pay | Admitting: Nurse Practitioner

## 2024-11-06 LAB — SIGNATERA
SIGNATERA MTM READOUT: 0 MTM/ml
SIGNATERA TEST RESULT: NEGATIVE

## 2024-11-09 ENCOUNTER — Ambulatory Visit: Payer: Self-pay | Admitting: Nurse Practitioner

## 2024-11-21 ENCOUNTER — Ambulatory Visit: Attending: Surgery

## 2024-11-21 DIAGNOSIS — Z483 Aftercare following surgery for neoplasm: Secondary | ICD-10-CM | POA: Insufficient documentation

## 2024-12-19 ENCOUNTER — Ambulatory Visit: Attending: Surgery

## 2025-01-09 ENCOUNTER — Other Ambulatory Visit (HOSPITAL_BASED_OUTPATIENT_CLINIC_OR_DEPARTMENT_OTHER)

## 2025-01-26 ENCOUNTER — Inpatient Hospital Stay

## 2025-01-26 ENCOUNTER — Inpatient Hospital Stay: Admitting: Nurse Practitioner
